# Patient Record
Sex: Female | Born: 1950 | Race: White | Hispanic: No | Smoking: Former smoker
Health system: Southern US, Community
[De-identification: ages and names within clinical notes are randomized; demographics above are authoritative.]

## PROBLEM LIST (undated history)

## (undated) DIAGNOSIS — M5020 Other cervical disc displacement, unspecified cervical region: Secondary | ICD-10-CM

## (undated) DIAGNOSIS — M1990 Unspecified osteoarthritis, unspecified site: Secondary | ICD-10-CM

## (undated) DIAGNOSIS — E039 Hypothyroidism, unspecified: Secondary | ICD-10-CM

## (undated) DIAGNOSIS — E785 Hyperlipidemia, unspecified: Secondary | ICD-10-CM

## (undated) DIAGNOSIS — M6530 Trigger finger, unspecified finger: Secondary | ICD-10-CM

## (undated) DIAGNOSIS — K219 Gastro-esophageal reflux disease without esophagitis: Secondary | ICD-10-CM

## (undated) DIAGNOSIS — R531 Weakness: Secondary | ICD-10-CM

## (undated) DIAGNOSIS — T7840XA Allergy, unspecified, initial encounter: Secondary | ICD-10-CM

## (undated) DIAGNOSIS — R351 Nocturia: Secondary | ICD-10-CM

## (undated) DIAGNOSIS — R112 Nausea with vomiting, unspecified: Secondary | ICD-10-CM

## (undated) DIAGNOSIS — R51 Headache: Secondary | ICD-10-CM

## (undated) DIAGNOSIS — R569 Unspecified convulsions: Secondary | ICD-10-CM

## (undated) DIAGNOSIS — K589 Irritable bowel syndrome without diarrhea: Secondary | ICD-10-CM

## (undated) DIAGNOSIS — Z87442 Personal history of urinary calculi: Secondary | ICD-10-CM

## (undated) DIAGNOSIS — M2550 Pain in unspecified joint: Secondary | ICD-10-CM

## (undated) DIAGNOSIS — N200 Calculus of kidney: Secondary | ICD-10-CM

## (undated) DIAGNOSIS — I10 Essential (primary) hypertension: Secondary | ICD-10-CM

## (undated) DIAGNOSIS — J45909 Unspecified asthma, uncomplicated: Secondary | ICD-10-CM

## (undated) DIAGNOSIS — G459 Transient cerebral ischemic attack, unspecified: Secondary | ICD-10-CM

## (undated) DIAGNOSIS — Z9889 Other specified postprocedural states: Secondary | ICD-10-CM

## (undated) DIAGNOSIS — R519 Headache, unspecified: Secondary | ICD-10-CM

## (undated) HISTORY — DX: Allergy, unspecified, initial encounter: T78.40XA

## (undated) HISTORY — PX: OTHER SURGICAL HISTORY: SHX169

## (undated) HISTORY — DX: Headache, unspecified: R51.9

## (undated) HISTORY — PX: TONSILLECTOMY: SUR1361

## (undated) HISTORY — PX: APPENDECTOMY: SHX54

## (undated) HISTORY — DX: Other cervical disc displacement, unspecified cervical region: M50.20

## (undated) HISTORY — PX: COLONOSCOPY: SHX174

## (undated) HISTORY — DX: Headache: R51

## (undated) HISTORY — PX: ESOPHAGOGASTRODUODENOSCOPY: SHX1529

## (undated) HISTORY — DX: Weakness: R53.1

## (undated) HISTORY — PX: KNEE ARTHROSCOPY: SUR90

---

## 1978-09-30 HISTORY — PX: ABDOMINAL HYSTERECTOMY: SHX81

## 1998-03-15 ENCOUNTER — Ambulatory Visit (HOSPITAL_COMMUNITY): Admission: RE | Admit: 1998-03-15 | Discharge: 1998-03-15 | Payer: Self-pay | Admitting: Internal Medicine

## 1998-03-17 ENCOUNTER — Ambulatory Visit (HOSPITAL_COMMUNITY): Admission: RE | Admit: 1998-03-17 | Discharge: 1998-03-17 | Payer: Self-pay | Admitting: Internal Medicine

## 1999-11-20 ENCOUNTER — Other Ambulatory Visit: Admission: RE | Admit: 1999-11-20 | Discharge: 1999-11-20 | Payer: Self-pay | Admitting: Obstetrics and Gynecology

## 1999-11-21 ENCOUNTER — Encounter: Payer: Self-pay | Admitting: Obstetrics and Gynecology

## 1999-11-21 ENCOUNTER — Ambulatory Visit (HOSPITAL_COMMUNITY): Admission: RE | Admit: 1999-11-21 | Discharge: 1999-11-21 | Payer: Self-pay | Admitting: Obstetrics and Gynecology

## 2000-11-24 ENCOUNTER — Encounter: Payer: Self-pay | Admitting: Neurosurgery

## 2000-11-24 ENCOUNTER — Inpatient Hospital Stay (HOSPITAL_COMMUNITY): Admission: RE | Admit: 2000-11-24 | Discharge: 2000-11-25 | Payer: Self-pay | Admitting: Neurosurgery

## 2000-12-17 ENCOUNTER — Encounter: Payer: Self-pay | Admitting: Neurosurgery

## 2000-12-17 ENCOUNTER — Encounter: Admission: RE | Admit: 2000-12-17 | Discharge: 2000-12-17 | Payer: Self-pay | Admitting: Neurosurgery

## 2001-01-01 ENCOUNTER — Other Ambulatory Visit: Admission: RE | Admit: 2001-01-01 | Discharge: 2001-01-01 | Payer: Self-pay | Admitting: Obstetrics and Gynecology

## 2001-01-30 ENCOUNTER — Encounter: Admission: RE | Admit: 2001-01-30 | Discharge: 2001-01-30 | Payer: Self-pay | Admitting: Neurosurgery

## 2001-01-30 ENCOUNTER — Encounter: Payer: Self-pay | Admitting: Neurosurgery

## 2002-03-24 ENCOUNTER — Other Ambulatory Visit: Admission: RE | Admit: 2002-03-24 | Discharge: 2002-03-24 | Payer: Self-pay | Admitting: Obstetrics and Gynecology

## 2002-07-31 ENCOUNTER — Encounter: Payer: Self-pay | Admitting: Family Medicine

## 2002-07-31 ENCOUNTER — Observation Stay (HOSPITAL_COMMUNITY): Admission: AD | Admit: 2002-07-31 | Discharge: 2002-08-01 | Payer: Self-pay | Admitting: Family Medicine

## 2004-02-17 ENCOUNTER — Emergency Department (HOSPITAL_COMMUNITY): Admission: EM | Admit: 2004-02-17 | Discharge: 2004-02-17 | Payer: Self-pay | Admitting: Emergency Medicine

## 2004-04-16 ENCOUNTER — Ambulatory Visit (HOSPITAL_COMMUNITY): Admission: RE | Admit: 2004-04-16 | Discharge: 2004-04-16 | Payer: Self-pay | Admitting: Internal Medicine

## 2005-09-30 HISTORY — PX: BACK SURGERY: SHX140

## 2006-02-17 ENCOUNTER — Ambulatory Visit (HOSPITAL_COMMUNITY): Admission: RE | Admit: 2006-02-17 | Discharge: 2006-02-18 | Payer: Self-pay | Admitting: Neurosurgery

## 2007-06-20 ENCOUNTER — Emergency Department (HOSPITAL_COMMUNITY): Admission: EM | Admit: 2007-06-20 | Discharge: 2007-06-20 | Payer: Self-pay | Admitting: Emergency Medicine

## 2007-11-12 ENCOUNTER — Emergency Department (HOSPITAL_COMMUNITY): Admission: EM | Admit: 2007-11-12 | Discharge: 2007-11-13 | Payer: Self-pay | Admitting: Emergency Medicine

## 2007-11-12 ENCOUNTER — Ambulatory Visit (HOSPITAL_COMMUNITY): Admission: RE | Admit: 2007-11-12 | Discharge: 2007-11-12 | Payer: Self-pay | Admitting: Urology

## 2007-11-12 HISTORY — PX: LITHOTRIPSY: SUR834

## 2008-08-31 ENCOUNTER — Ambulatory Visit (HOSPITAL_COMMUNITY): Admission: RE | Admit: 2008-08-31 | Discharge: 2008-09-01 | Payer: Self-pay | Admitting: Neurosurgery

## 2008-11-18 HISTORY — PX: EYE SURGERY: SHX253

## 2008-12-14 ENCOUNTER — Ambulatory Visit (HOSPITAL_COMMUNITY): Admission: RE | Admit: 2008-12-14 | Discharge: 2008-12-14 | Payer: Self-pay | Admitting: Internal Medicine

## 2008-12-21 ENCOUNTER — Encounter (HOSPITAL_COMMUNITY): Admission: RE | Admit: 2008-12-21 | Discharge: 2009-01-20 | Payer: Self-pay | Admitting: Internal Medicine

## 2009-05-31 ENCOUNTER — Ambulatory Visit (HOSPITAL_COMMUNITY): Admission: RE | Admit: 2009-05-31 | Discharge: 2009-05-31 | Payer: Self-pay | Admitting: Urology

## 2009-06-01 HISTORY — PX: EYE SURGERY: SHX253

## 2009-07-15 ENCOUNTER — Emergency Department (HOSPITAL_COMMUNITY): Admission: EM | Admit: 2009-07-15 | Discharge: 2009-07-15 | Payer: Self-pay | Admitting: Emergency Medicine

## 2009-09-08 IMAGING — US US ABDOMEN COMPLETE
1 series · 13 of 25 positions shown · non-contrast
Comparison: None

CLINICAL DATA: Biliary colic, history right renal calculus,
dysphagia

COMPLETE ABDOMINAL ULTRASOUND

[Series 1: us abdomen complete · 0.28mm/px · 13 of 76 slices shown]
[im 1/76]
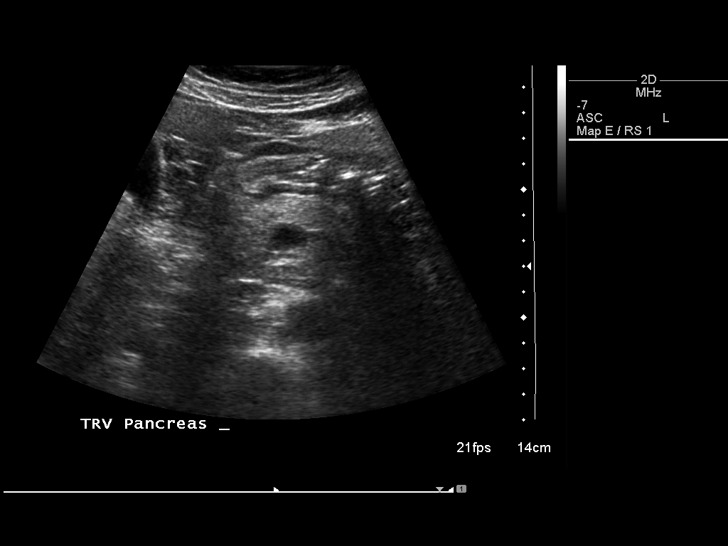
[im 7/76]
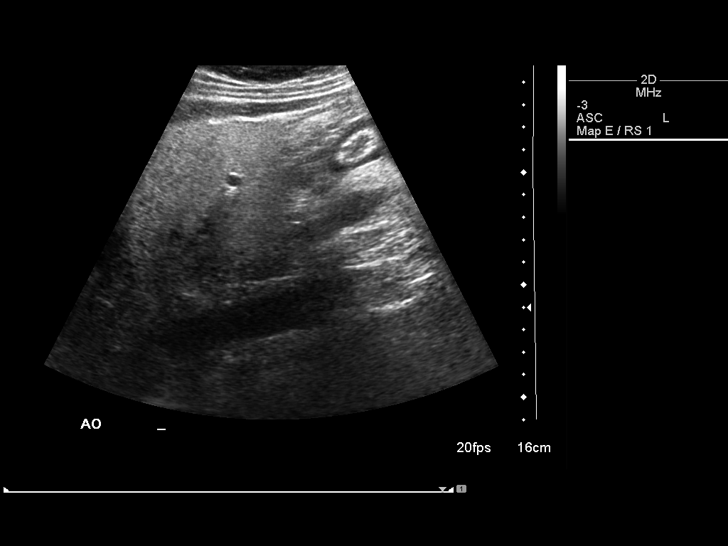
[im 13/76]
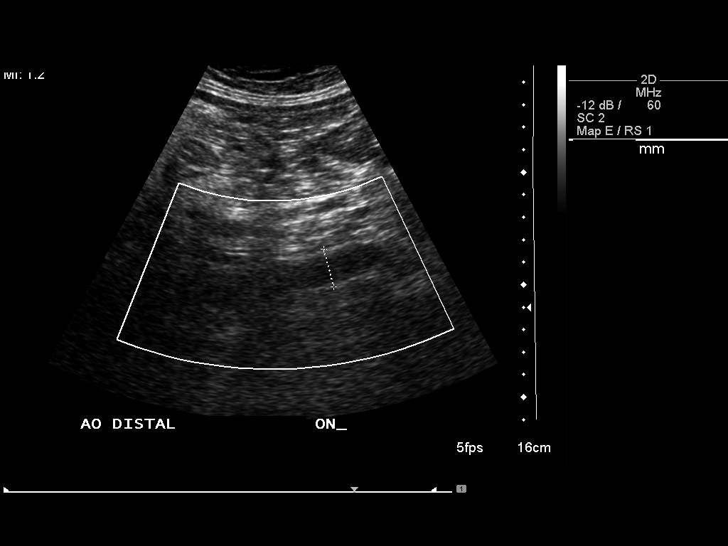
[im 19/76]
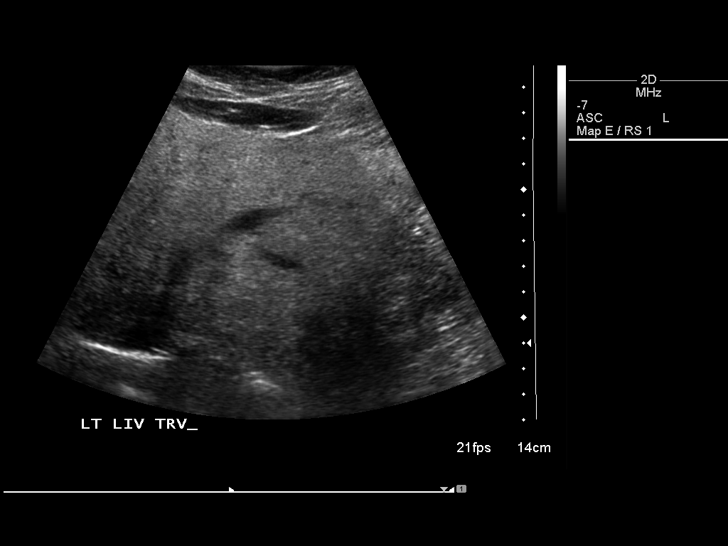
[im 26/76]
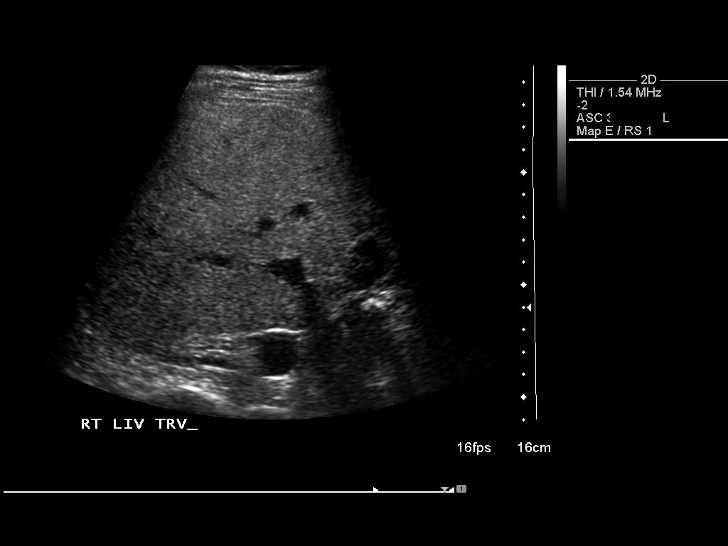
[im 32/76]
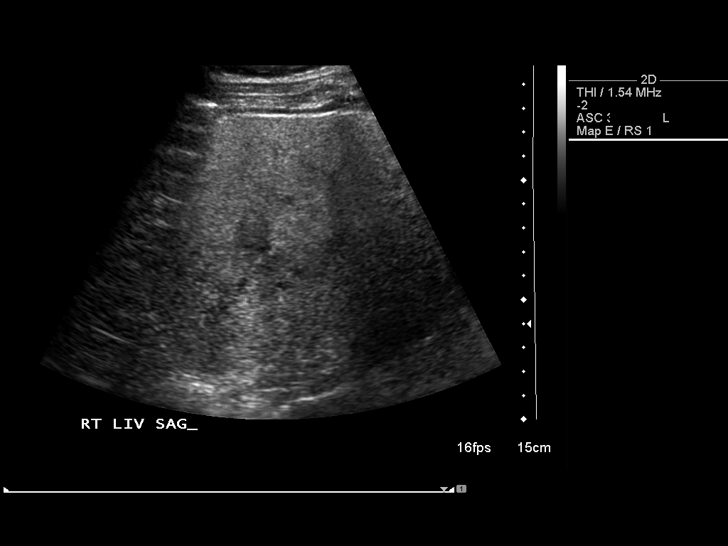
[im 38/76]
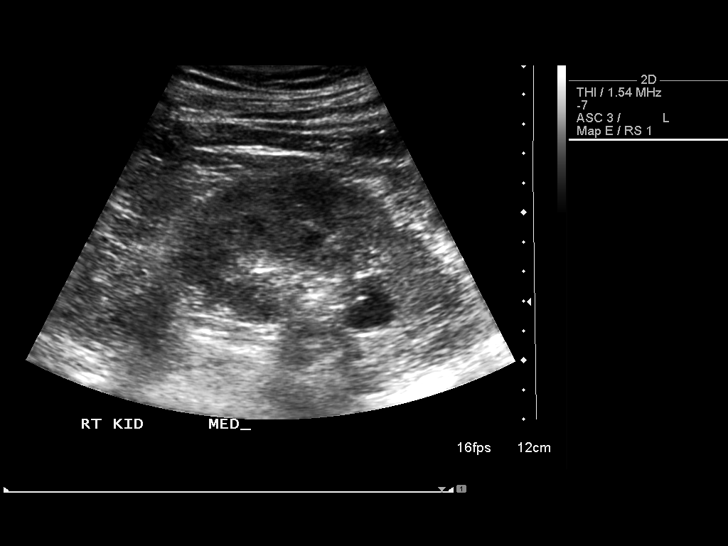
[im 44/76]
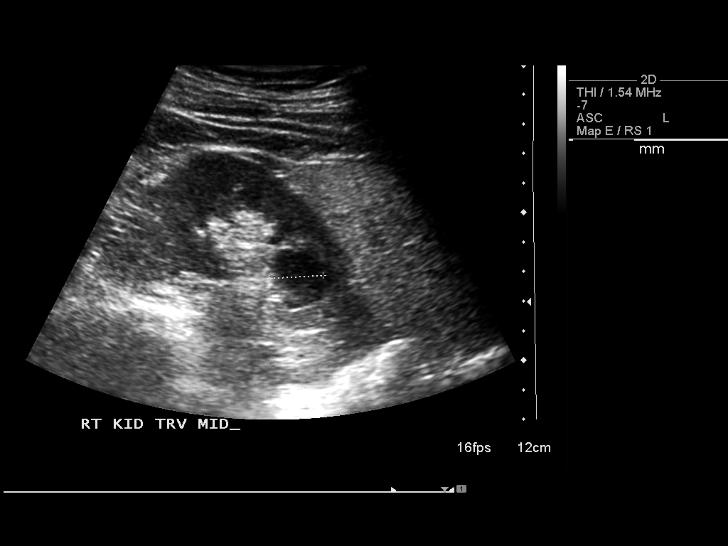
[im 51/76]
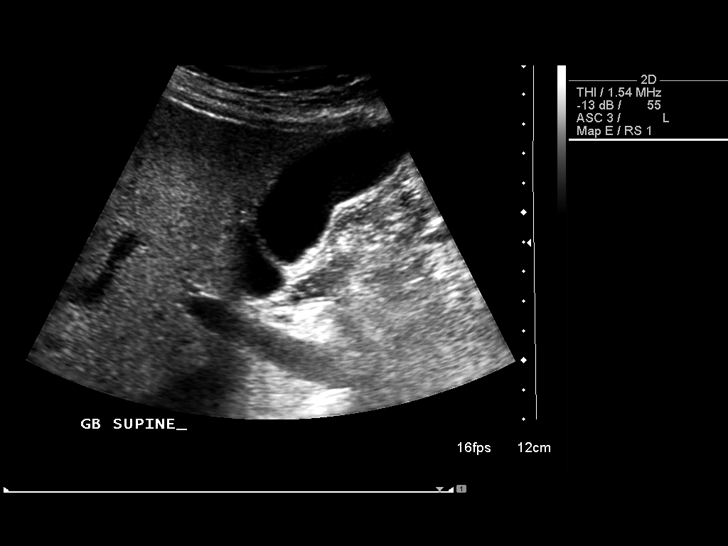
[im 57/76]
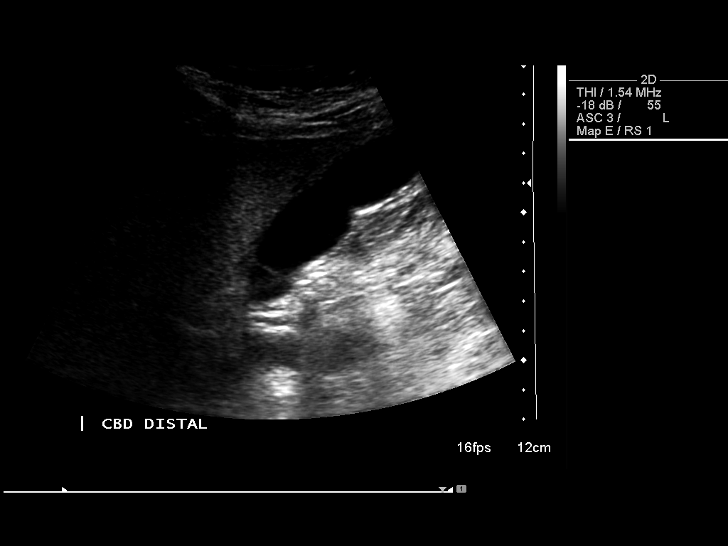
[im 63/76]
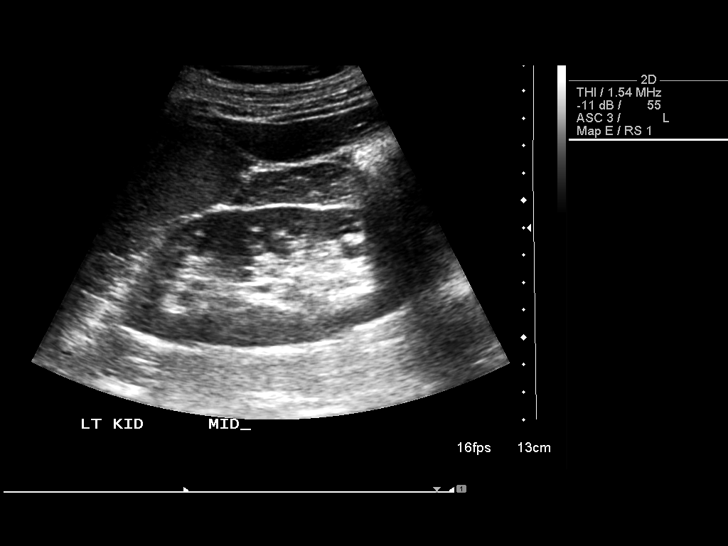
[im 69/76]
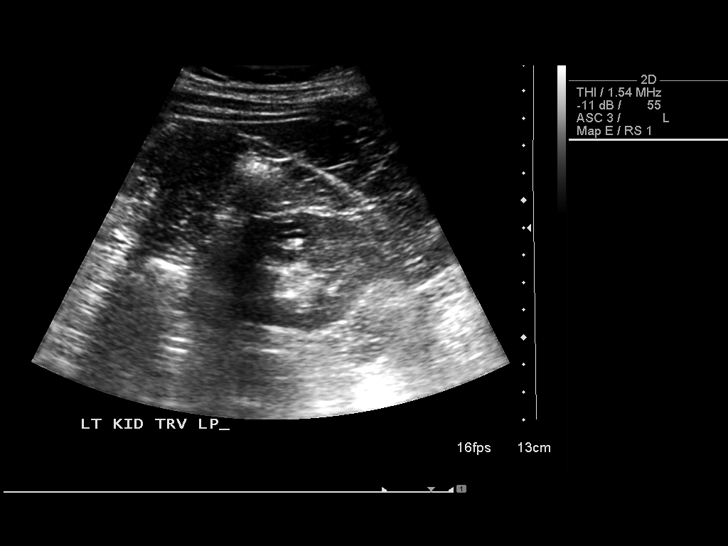
[im 76/76]
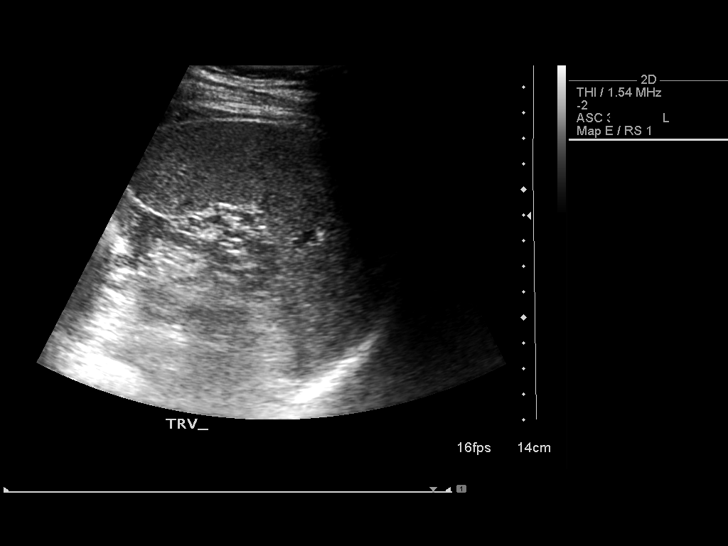

[13 of 25 positions shown; findings below may reference images not displayed]

FINDINGS: Gallbladder:  Normally distended without stones, wall thickening,
pericholecystic fluid, or sonographic Murphy's sign.

Common bile duct:  2.9 mm diameter, normal.

Liver:  Echogenic, likely fatty infiltration, though this can be
seen with cirrhosis and certain infiltrative disorders.  No focal
hepatic mass or nodularity.  Minimal inhomogeneity of hepatic
echogenicity centrally and near gallbladder fossa, potentially may
represent areas of focal sparing.

IVC:  Unremarkable.

Pancreas:  Normal appearance.

Spleen:  Normal appearance, 9.6 cm length.

Right Kidney:  Normal size 10.8 cm length with peripelvic cyst at
lower pole 1.6 x 1.4 x 1.8 cm.

Left Kidney:  Normal size 10.1 cm length without hydronephrosis or
shadowing calculus.  Echogenic nonshadowing focus noted in the mid
left kidney, nonspecific, nonshadowing calculus not excluded.

Abdominal aorta:  Normal caliber.

No ascites.
IMPRESSION: Probable fatty infiltration of liver with question scattered focal
sparing.
Cannot exclude nonshadowing nonobstructing mid left renal calculus.
Otherwise normal exam.

## 2010-03-14 ENCOUNTER — Encounter: Admission: RE | Admit: 2010-03-14 | Discharge: 2010-03-14 | Payer: Self-pay | Admitting: Neurosurgery

## 2010-03-19 ENCOUNTER — Encounter (HOSPITAL_COMMUNITY): Admission: RE | Admit: 2010-03-19 | Discharge: 2010-03-19 | Payer: Self-pay | Admitting: Endocrinology

## 2010-03-30 ENCOUNTER — Ambulatory Visit (HOSPITAL_COMMUNITY): Admission: RE | Admit: 2010-03-30 | Discharge: 2010-03-30 | Payer: Self-pay | Admitting: Endocrinology

## 2010-05-29 ENCOUNTER — Ambulatory Visit (HOSPITAL_COMMUNITY): Admission: RE | Admit: 2010-05-29 | Discharge: 2010-05-29 | Payer: Self-pay | Admitting: Internal Medicine

## 2010-10-21 ENCOUNTER — Encounter: Payer: Self-pay | Admitting: Internal Medicine

## 2010-11-23 ENCOUNTER — Other Ambulatory Visit: Payer: Self-pay | Admitting: Urology

## 2010-11-23 ENCOUNTER — Ambulatory Visit (HOSPITAL_COMMUNITY)
Admission: RE | Admit: 2010-11-23 | Discharge: 2010-11-23 | Disposition: A | Discharge status: Home / Self Care | Payer: BC Managed Care – PPO | Source: Ambulatory Visit | Attending: Urology | Admitting: Urology

## 2010-11-23 DIAGNOSIS — N200 Calculus of kidney: Secondary | ICD-10-CM

## 2010-11-23 DIAGNOSIS — R109 Unspecified abdominal pain: Secondary | ICD-10-CM | POA: Insufficient documentation

## 2010-11-30 ENCOUNTER — Ambulatory Visit (INDEPENDENT_AMBULATORY_CARE_PROVIDER_SITE_OTHER): Payer: BC Managed Care – PPO | Admitting: Urology

## 2010-11-30 DIAGNOSIS — N318 Other neuromuscular dysfunction of bladder: Secondary | ICD-10-CM

## 2010-11-30 DIAGNOSIS — R1033 Periumbilical pain: Secondary | ICD-10-CM

## 2010-11-30 DIAGNOSIS — M545 Low back pain: Secondary | ICD-10-CM

## 2010-11-30 DIAGNOSIS — Z87442 Personal history of urinary calculi: Secondary | ICD-10-CM

## 2011-02-12 NOTE — Op Note (Signed)
[name], [name]                ACCOUNT NO.:  [id number]   MEDICAL RECORD NO.:  [id number]          PATIENT TYPE:  OIB   LOCATION:  3536                         FACILITY:  [facility]   PHYSICIAN:  Hewitt Shorts, M.D.DATE OF BIRTH:  05-Oct-1950   DATE OF PROCEDURE:  DATE OF DISCHARGE:                               OPERATIVE REPORT   PREOPERATIVE DIAGNOSES:  1. C6-7 cervical disk herniation.  2. Cervical degenerative disk disease.  3. Cervical spondylosis.  4. Cervical radiculopathy.   POSTOPERATIVE DIAGNOSES:  1. C6-7 cervical disk herniation.  2. Cervical degenerative disk disease.  3. Cervical spondylosis.  4. Cervical radiculopathy.   PROCEDURE:  C6-7 anterior cervical decompression arthrodesis with  allograft and cervical plating with an RSB spine interplate.   SURGEON:  Hewitt Shorts, MD   ASSISTANT:  1. Nelia Shi. Webb Silversmith, NP.  2. Coletta Memos, MD   ANESTHESIA:  General endotracheal.   INDICATIONS:  The patient is a 60 year old woman who has had a previous  C5-6 ACDF.  She presented with neck and radicular pain and was found to  have a large disk herniation at the C6-7 level extending bilaterally  with degeneration at that level as well as other levels in the cervical  spine.  Decision was made to proceed with single-level decompression  arthrodesis.   PROCEDURE:  The patient was brought to the operating room and placed  under general endotracheal anesthesia.  The patient was placed in 10  pounds of Holter traction.  The neck was prepped with Betadine solution  and draped in sterile fashion.  A horizontal incision was made in the  left side of the neck about a fingerbreadth below her previous left-  sided anterior cervical incision.  Dissection was carried down through  the subcutaneous tissue and platysma with bipolar cautery.  Electrocautery was used to maintain hemostasis.  Dissection was carried  out through the avascular plane leaving the  sternocleidomastoid, carotid  artery, and jugular vein laterally and the trachea and esophagus  medially.  The ventral aspect of the vertebral column was identified and  localizing x-rays were taken and the C6-7 intervertebral disk space was  identified.  Diskectomy was begun with incision of the annulus and  continued with microcurettes and pituitary rongeurs.  The operating  microscope was draped and brought into the field to provide additional  magnification, illumination, and visualization.  The remainder of the  decompression was performed using microdissection and microsurgical  technique.  Diskectomy was continued posteriorly through the disk space.  The cauterized endplates of the vertebral bodies were removed using  microcurettes along with the XMax drill, and then posterior osteophytic  overgrowth was removed using the XMax drill.  We then began to encounter  the disk herniation, a portion of it which was embedded within the  posterior longitudinal ligament.  But then as we opened the posterior  longitudinal ligament, there were significant additional portions  posterior to the ligament.  There was disk herniation extending  laterally to the neuroforamen bilaterally and this was carefully  mobilized using a micro hook.  In the end,  all loose fragments of disk  material removed from the disk space and the epidural space.  The  posterior longitudinal ligament was fully removed and the spinal canal  and thecal sac as well as the axillary nerve roots and neuroforamen were  decompressed.  Once the diskectomy was completed.  Hemostasis was  established with the use of Gelfoam soaked in thrombin, the Gelfoam was  removed.  We measured the height of the vertebral disk space and  selected a 7-mm in height allograft.  We used Osteotec dense cancellous  7-mm x 11-mm graft.  It was positioned in the intervertebral disk spaces  and countersunk.  We then selected a 7-mm RSB spine interplate.   It  actually was taken from proper sizing and then a pair of 10 mm screws  were placed rostrally into the C6 vertebra and a single 10-mm screw was  placed caudally into the C7 vertebra.  Then, the cover plate was  tightened with a torque wrench.  Another x-ray was taken, which showed  the plate and screws in good position.  The alignment was good.   The wound was irrigated with Bacitracin solution and checked for  hemostasis, which was established with use of bipolar cautery and once  hemostasis was confirmed, we proceeded with closure.  The platysma was  closed with interrupted and inverted 2-0 undyed Vicryl sutures.  The  subcutaneous and subcuticular layer were closed with interrupted 3-0  undyed Vicryl sutures.  The skin was reapproximated with Dermabond.  The  procedure was tolerated well.  The estimated blood loss was 50 mL.  Sponge and needle count were correct.  The cervical traction was  discontinued after the bone graft was placed prior to the plating.      Hewitt Shorts, M.D.  Electronically Signed     RWN/MEDQ  D:  08/31/2008  T:  09/01/2008  Job:  [id number]

## 2011-02-15 NOTE — H&P (Signed)
[place]. [facility]  Patient:    [name], [name]                        MRN: [id number] Adm. Date:  11/24/00 Attending:  Hewitt Shorts, M.D.                         History and Physical  HISTORY OF PRESENT ILLNESS:  The patient is a 60 year old, right-handed white female who was evaluated for a left cervical radiculopathy, secondary to a central to left C5-6 cervical disk herniation.  The patient explained that she has been having discomfort in the left side of her neck for some time, but around the holidays she developed pain around the left scapula, as well as headaches, and disabling numbness in the left upper extremity into her arm and forearm, particularly to the first and second digits of her left hand.  She had x-rays and an MRI scan, and a neurosurgical consultation was requested. She did not describe any specific weakness, but she says that she has been dropping things from her left hand recent.  She denies any right upper extremity symptoms.  She was on Vioxx which has reduced the pain, but it has not done much for her numbness.  She was given a prescription for Darvocet but has not used much of it, since the Vioxx helps to reduce the pain.  The patient is admitted now for a single-level C5-6 anterior cervical diskectomy and arthrodesis, with allograft and cervical plating.  PAST MEDICAL HISTORY:  No history of hypertension, myocardial infarction, cancer, stroke, diabetes mellitus, peptic ulcer disease, or lung disease.  PAST SURGICAL HISTORY: 1. Hysterectomy. 2. Surgery on her left leg. 3. Bilateral carpal tunnel release in 1995.  ALLERGIES:  TETRACYCLINE.  CURRENT MEDICATIONS: 1. Estrace one q.a.m. 2. A decongestant once or twice q.d. 3. Vioxx 25 mg q.d.  FAMILY HISTORY:  Her mother is in poor health at age 5.  She has heart disease,  hypertension, and dementia.  Her father committed suicide in his late 69s.  He was an  alcoholic.  SOCIAL HISTORY:  The patient is married.  She works as a Retail buyer. She smokes 3/4 pack per day.  She has smoked for 33 years.  She does not drink alcoholic beverages.  She denies a history of substance abuse.  REVIEW OF SYSTEMS:  Notable for that described in her history of present illness and past medical history, but is otherwise unremarkable.  PHYSICAL EXAMINATION:  GENERAL:  A well-developed, well-nourished white female, in no acute distress.  VITAL SIGNS:  Temperature 97.5 degrees, pulse 86, blood pressure 132/65, respirations 16.  Height 5 feet 6 inches, weight 145 pounds.  LUNGS:  Clear to auscultation.  She has symmetrical respiratory excursions.  HEART:  A regular rate and rhythm.  Normal S1, S2.  There are no murmurs.  ABDOMEN:  Soft, nondistended.  Bowel sounds present.  EXTREMITIES:  No cyanosis, clubbing, or edema.  MUSCULOSKELETAL:  Shows no tenderness to palpation over the cervical spinous process or paracervical musculature.  She has a good range of motion of the neck, but some discomfort, particularly with extension of the neck, as well as with lateral flexion to the left.  She has mild discomfort on testing for impingement of the left shoulder, but no tenderness to palpation over the acromioclavicular joints bilaterally.  There is no discomfort on testing for impingement in the right  shoulder.  NEUROLOGIC:  Shows 5/5 strength in the upper extremities, including the deltoid, biceps, triceps, intrinsics, and grip.  Sensation intact to pinprick through the upper extremities.  Reflexes are minimal in the biceps, brachialis, and triceps, 1 in the quadriceps, minimal in the gastrocnemius, symmetrical bilaterally.  Toes are downgoing bilaterally.  She has a normal gait and stance.  DIAGNOSTIC STUDIES:  X-rays show little in the way of degenerative disk disease or spondylosis.  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  The most significant finding on the MRI is a small central to left C5-6 cervical disk herniation, exiting to her foramen.  IMPRESSION:  Left C6 cervical radiculopathy, secondary to a small central to left C5-6 cervical disk herniation.  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  She does have mild discomfort on testing for impingement in the left shoulder, suggestive of a mild rotator tendinitis, that is improved with Vioxx.  PLAN:  The patient is admitted for a single-level C5-6 anterior cervical diskectomy and arthrodesis with allograft and cervical plating.  We discussed alternatives to the surgery, and the possible future need of a decompressive cervical laminectomy.  We discussed the nature of the C5-6 anterior cervical diskectomy and arthrodesis, to be done with allograft and cervical plating, the typical length of surgery, hospital stay, and overall recuperation, the limitations during the postoperative period, and the need for postoperative immobilization in a soft cervical collar.  We also discussed the risks of surgery, including the risks of infection, bleeding, possible need for transfusion, the risks of nerve root dysfunction, with pain, weakness, numbness, or paresthesias, the risks of esophageal and/or laryngeal dysfunction with difficulty swallowing or hoarseness of voice.  We discussed the risks of failure of the arthrodesis and nonunion, particularly due to her smoking, but also to her being postmenopausal.  We discussed the risks of anesthesia, including a myocardial infarction, stroke, pneumonia, and death, all of which are increased due to her smoking history.  She has further been strongly counseled to quit smoking, to improve the likelihood  of a successful arthrodesis and outcome.  Understanding all of this, the patient does wish to proceed with surgery, and is admitted for such. DD:  11/24/00 TD:  11/24/00  Job: [id number]

## 2011-02-15 NOTE — Op Note (Signed)
[place]  Patient:    [name], [name]                       MRN: [id number] Proc. Date: 11/24/00 Adm. Date:  [id number] Attending:  Barton Fanny                           Operative Report  PREOPERATIVE DIAGNOSIS:  C5-6 cervical disk herniation.  POSTOPERATIVE DIAGNOSIS:  C5-6 cervical disk herniation.  PROCEDURE:  C5-6 Anterior cervical diskectomy, arthrodesis with iliac crest allograft and tether cervical plating.  SURGEON:  Hewitt Shorts, M.D.  ANESTHESIA:  General endotracheal.  INDICATIONS:   Patient is a 60 year old woman, who presented with a left cervical radiculopathy, who was found by MRI scan to have a central to left C5-6 cervical disk herniation.  Decision was made to proceed with a single level anterior cervical diskectomy and arthrodesis.  DESCRIPTION OF PROCEDURE:  Patient brought to the operating room and placed under general endotracheal anesthesia.  Patient was placed in 10 pounds of Holter traction and the neck was prepped with Betadine soap and solution and draped in a sterile fashion.  A horizontal incision was made in the left side of the neck.  The line of the incision was infiltrated with local anesthetic with epinephrine.  The incision itself was made with a Shaw scalpel at temperature 120.  The dissection was carried down through the subcutaneous tissue and platysma.  Dissection was then carried out to an avascular plane leaving the sternocleidomastoid, carotid artery and jugular vein laterally and trachea and esophagus medially.  The ventral aspect of the vertebral column was identified, localizing x-ray taken and the C5-6 intravertebral disk space was identified.  Diskectomy was begun with incision of the annulus and continued with microcurets and pituitary rongeurs.  The microscope was draped and brought into the field to provide instrument magnification, illumination and visualization and the  remainder of the procedure was performed using microdissection technique.   The cartilaginous endplates of the C5 and C6 vertebrae were removed using the MicroMax drill and then we removed posterior osteophytic overgrowth with the MicroMax drill and 2 mm Kerrison punch with a thin footplate.  Foraminotomies were performed bilaterally and the posterior longitudinal ligament was removed.  We then removed the disk herniation that was located centrally and to the left and good decompression of the thecal sac and of the C6 nerve roots was achieved bilaterally.  Once the diskectomy and decompression was completed, we established hemostasis with the use of Gelfoam soaked in thrombin.  All the Gelfoam was removed prior to proceeding with the arthrodesis.  We selected a wedge of iliac crest allograft.  It was cut and shaped and positioned in the intervertebral disk space and countersunk. Anterior osteophytic overgrowth was removed and then we selected a 14 mm tether cervical plate.  It was positioned over the fusion construct and secured to each of the vertebra with a pair of 4.0 x 13 mm screws.  Each screw hole was started with an awl and then screw was placed in an alternating fashion.  Final tightening of all four screws was done and x-ray was taken that showed the graft in good position, plate and screws in good position. The overall alignment was good.  We then irrigated the wound with bacitracin solution, checked for hemostasis, which was established and confirmed and then proceeded with closure.  The platysma  was closed with interrupted inverted 2-0 undyed Vicryl sutures.  The subcutaneous and subcuticular were closed with interrupted, inverted 3-0 undyed Vicryl sutures and the skin edges were approximated with Dermabond.  The patient tolerated the procedure well.  The estimated blood loss was less than 50 cc.  Sponge and needle counts were correct.  Following surgery, the patient was taken  out of cervical traction to be reversed from the anesthetic, extubated and transferred to the recovery room for further care. DD:  11/24/00 TD:  11/24/00 Job: [id number]

## 2011-02-15 NOTE — Op Note (Signed)
[name], [name]                ACCOUNT NO.:  [id number]   MEDICAL RECORD NO.:  [id number]          PATIENT TYPE:  OIB   LOCATION:  3014                         FACILITY:  [facility]   PHYSICIAN:  Hewitt Shorts, M.D.DATE OF BIRTH:  1950/10/15   DATE OF PROCEDURE:  02/17/2006  DATE OF DISCHARGE:                                 OPERATIVE REPORT   PREOPERATIVE DIAGNOSES:  1.  Left L5-S1 lumbar disk herniation.  2.  Lumbar degenerative disk disease.  3.  Lumbar spondylosis.  4.  Lumbar radiculopathy.   POSTOPERATIVE DIAGNOSES:  1.  Left L5-S1 lumbar disk herniation.  2.  Lumbar degenerative disk disease.  3.  Lumbar spondylosis.  4.  Lumbar radiculopathy.   PROCEDURE:  Left L5-S1 lumbar laminotomy and microdiskectomy with  microdissection.   SURGEON:  Hewitt Shorts, MD.   ASSISTANT:  Clydene Fake, MD.   ANESTHESIA:  General endotracheal.   INDICATIONS:  The patient is a 60 year old woman, who presented with a left  lumbar radiculopathy and was found by MRI scan to have a large, central,  left, L5-S1 lumbar disk herniation.  The decision was made to proceed with  laminotomy and microdiskectomy.   DESCRIPTION OF PROCEDURE:  The patient was brought to the operating room and  placed under a general endotracheal anesthesia.  The patient was turned to a  prone position.  The lumbar region was prepped with Betadine soap and  solution and draped in a sterile fashion.  An x-ray was taken and the L5-S1  level identified, and a midline incision was made over the L5-S1 level and  carried down through the subcutaneous tissue.  Bipolar cautery and  electrocautery were used to maintain hemostasis.  Dissection was carried  down to the lumbar fascia, which was incised on the left side of midline,  and the paraspinal muscles were dissected from the spinous process and  lamina in a subperiosteal fashion.  The L5-S1 interlaminar space was  identified, and an x-ray was taken to  confirm the localization.  Then the  microscope was draped and brought onto the field to provide additional  magnification, illumination, and visualization, and the remainder of the  decompression was performed using microdissection and microsurgical  technique.  The laminotomy was performed using the XMax drill and Kerrison  punches.  The ligamentum flavum was carefully removed, and we identified the  thecal sac exiting the left S1 nerve root.  These structures were gently  retracted medially exposing a large, spondylitic, disk herniation.  We  incised the spondylitic annulus and removed hypertrophic changes from the  posterior aspect of the L5 and S1 vertebrae.  The diskectomy was performed  using a variety of microcurettes and pituitary rongeurs.  A thorough  diskectomy was performed removing all loose fragmented disk material and  decompressing the thecal sac and S1 nerve root.  A thorough diskectomy was  performed with good decompression of the neural structures, and once the  diskectomy was completed, hemostasis was established with the use of bipolar  cautery as well as electrocautery as well as Gelfoam soaked in thrombin,  and  once the hemostasis was confirmed we instilled 2 ml of Fentanyl and 80 mg of  Depo-Medrol into the epidural space and then proceeded with closure.  The  lumbar fascia was closed with interrupted undyed #1-0 Vicryl sutures, the  Scarpa's fascia was closed with interrupted #1-0 and #2-0 undyed Vicryl  sutures, and the subcutaneous and subcuticular layer closed with interrupted  inverted #2-0 and #3-0 undyed Vicryl sutures, and the skin was approximated  with Dermabond.  The procedure was tolerated well.  The estimated blood loss  was less than 25 ml.  Sponge, needle, and instrument counts were correct.  Following this, the patient was turned back to the supine position to be  reversed from anesthetic, extubated, and transferred to the recovery room  for further  care.      Hewitt Shorts, M.D.  Electronically Signed     RWN/MEDQ  D:  02/17/2006  T:  02/17/2006  Job:  [id number]

## 2011-02-15 NOTE — H&P (Signed)
NAMEAvrie, [name] NO.:  [id number]   MEDICAL RECORD NO.:  [id number]                  PATIENT TYPE:   LOCATION:                                       FACILITY:   PHYSICIAN:  Corrie Mckusick, M.D.               DATE OF BIRTH:  Jun 21, 1951   DATE OF ADMISSION:  07/31/2002  DATE OF DISCHARGE:                                HISTORY & PHYSICAL   ADMISSION DIAGNOSIS:  Asthmatic bronchitis.   PRIMARY PHYSICIAN:  [facility].   HISTORY OF PRESENT ILLNESS:  A 60 year old asthmatic who also smokes who  came to the office on July 26, 2002 with four to five days of cough, mild  shortness of breath, and was put on Z pack, Anaplex, and given Decadron.  She was strongly encouraged to quit smoking.  Since that time she has  basically not gotten any better and has possibly gotten worse.  She came to  the office this morning with more wheezing, more diffuse rhonchi, and  overall feeling not much better.  It was decided to put her in overnight for  aggressive nebulized treatments and IV antibiotics.   PAST MEDICAL HISTORY:  Asthma.   PAST SURGICAL HISTORY:  Partial hysterectomy, left knee surgery,  tonsillectomy, and lumbar discectomy.   SOCIAL HISTORY:  She smokes a half a pack per day, no alcohol abuse.   FAMILY HISTORY:  Significant for hypertension, diabetes, and asthma.   OBJECTIVE:  VITAL SIGNS:  Respiratory rate 22, pulse 78, temperature 98.0.  GENERAL:  When I saw the patient she was talkative but clearly short of  breath.  HEENT:  Normocephalic, atraumatic.  Pupils equal, round, and reactive to  light, extraocular muscles intact.  Nasal and oropharynx are clear.  CHEST:  Diffuse end expiratory and inspiratory rhonchi with mild end  expiratory wheezing.  Prolonged expiratory phase as well.  No areas of  consolidation at the bases.  CARDIOVASCULAR:  Regular rate and rhythm with normal S1, S2.  ABDOMEN:  Soft.  EXTREMITIES:  No cyanosis or  clubbing, no edema.   ASSESSMENT:  A 60 year old female with asthmatic bronchitis.   PLAN:  1. Admit for observation.  2. Albuterol and Atrovent nebulizers q.i.d.  3. Albuterol nebulizers q.2h. p.r.n.  4. Levaquin 500 mg IV q.24h.  5. Prednisone 50 mg q.d.  6. Advair 250/50 b.i.d.  7. Singulair 10 mg daily.  8. Continue other hospital medications.  9. Strongly encourage the patient to discontinue smoking.  10.      CBC, Chem-7 on admission.  11.      Chest x-ray on admission.  12.      Will continue to follow closely.  Corrie Mckusick, M.D.    JCG/MEDQ  D:  07/31/2002  T:  07/31/2002  Job:  [id number]

## 2011-04-17 ENCOUNTER — Other Ambulatory Visit (HOSPITAL_COMMUNITY): Payer: Self-pay | Admitting: Endocrinology

## 2011-04-17 DIAGNOSIS — E049 Nontoxic goiter, unspecified: Secondary | ICD-10-CM

## 2011-04-22 ENCOUNTER — Other Ambulatory Visit (HOSPITAL_COMMUNITY): Payer: BC Managed Care – PPO

## 2011-04-23 ENCOUNTER — Ambulatory Visit (HOSPITAL_COMMUNITY)
Admission: RE | Admit: 2011-04-23 | Discharge: 2011-04-23 | Disposition: A | Discharge status: Home / Self Care | Payer: BC Managed Care – PPO | Source: Ambulatory Visit | Attending: Endocrinology | Admitting: Endocrinology

## 2011-04-23 DIAGNOSIS — E049 Nontoxic goiter, unspecified: Secondary | ICD-10-CM | POA: Insufficient documentation

## 2011-06-07 ENCOUNTER — Other Ambulatory Visit (INDEPENDENT_AMBULATORY_CARE_PROVIDER_SITE_OTHER): Payer: Self-pay | Admitting: *Deleted

## 2011-06-07 DIAGNOSIS — K219 Gastro-esophageal reflux disease without esophagitis: Secondary | ICD-10-CM

## 2011-06-07 NOTE — Telephone Encounter (Signed)
Refill request for Omeprazole 20 mg Capsule #90 with 11 refills . Last Refill was 03-11-2011. Prescriber: Delrae Rend  Patient takes 1 capsule by mouth once daily

## 2011-06-10 MED ORDER — OMEPRAZOLE 20 MG PO CPDR: 20.0000 mg | DELAYED_RELEASE_CAPSULE | Freq: Every day | ORAL | Status: DC

## 2011-06-14 ENCOUNTER — Other Ambulatory Visit (HOSPITAL_COMMUNITY): Payer: Self-pay | Admitting: Internal Medicine

## 2011-06-14 ENCOUNTER — Ambulatory Visit (HOSPITAL_COMMUNITY)
Admission: RE | Admit: 2011-06-14 | Discharge: 2011-06-14 | Disposition: A | Discharge status: Home / Self Care | Payer: BC Managed Care – PPO | Source: Ambulatory Visit | Attending: Internal Medicine | Admitting: Internal Medicine

## 2011-06-14 DIAGNOSIS — R079 Chest pain, unspecified: Secondary | ICD-10-CM

## 2011-06-14 DIAGNOSIS — F17200 Nicotine dependence, unspecified, uncomplicated: Secondary | ICD-10-CM | POA: Insufficient documentation

## 2011-06-14 DIAGNOSIS — R05 Cough: Secondary | ICD-10-CM | POA: Insufficient documentation

## 2011-06-14 DIAGNOSIS — R0789 Other chest pain: Secondary | ICD-10-CM | POA: Insufficient documentation

## 2011-06-14 DIAGNOSIS — R059 Cough, unspecified: Secondary | ICD-10-CM | POA: Insufficient documentation

## 2011-07-05 LAB — CBC
HCT: 47.3 % — ABNORMAL HIGH (ref 36.0–46.0)
Hemoglobin: 15.5 g/dL — ABNORMAL HIGH (ref 12.0–15.0)
MCHC: 32.8 g/dL (ref 30.0–36.0)
MCV: 86.6 fL (ref 78.0–100.0)
Platelets: 287 10*3/uL (ref 150–400)
RBC: 5.46 MIL/uL — ABNORMAL HIGH (ref 3.87–5.11)
RDW: 14.7 % (ref 11.5–15.5)
WBC: 6.9 10*3/uL (ref 4.0–10.5)

## 2012-05-11 ENCOUNTER — Other Ambulatory Visit (HOSPITAL_COMMUNITY): Payer: Self-pay | Admitting: "Endocrinology

## 2012-05-11 DIAGNOSIS — E213 Hyperparathyroidism, unspecified: Secondary | ICD-10-CM

## 2012-05-18 ENCOUNTER — Encounter (HOSPITAL_COMMUNITY): Payer: Self-pay

## 2012-05-18 ENCOUNTER — Encounter (HOSPITAL_COMMUNITY)
Admission: RE | Admit: 2012-05-18 | Discharge: 2012-05-18 | Disposition: A | Discharge status: Home / Self Care | Payer: BC Managed Care – PPO | Source: Ambulatory Visit | Attending: "Endocrinology | Admitting: "Endocrinology

## 2012-05-18 DIAGNOSIS — E213 Hyperparathyroidism, unspecified: Secondary | ICD-10-CM

## 2012-05-18 HISTORY — DX: Unspecified asthma, uncomplicated: J45.909

## 2012-05-18 MED ORDER — TECHNETIUM TC 99M SESTAMIBI - CARDIOLITE
25.0000 | Freq: Once | INTRAVENOUS | Status: AC | PRN
  Administered 2012-05-18: 23 via INTRAVENOUS

## 2012-06-10 ENCOUNTER — Ambulatory Visit (INDEPENDENT_AMBULATORY_CARE_PROVIDER_SITE_OTHER): Payer: BC Managed Care – PPO | Admitting: Surgery

## 2012-06-10 ENCOUNTER — Encounter (INDEPENDENT_AMBULATORY_CARE_PROVIDER_SITE_OTHER): Payer: Self-pay | Admitting: Surgery

## 2012-06-10 VITALS — BP 142/80 | HR 76 | Temp 97.8°F | Resp 16 | Ht 66.0 in | Wt 151.0 lb

## 2012-06-10 DIAGNOSIS — E210 Primary hyperparathyroidism: Secondary | ICD-10-CM

## 2012-06-10 NOTE — Patient Instructions (Signed)
[facility] [phone]  THYROID & PARATHYROID SURGERY -- POST OP INSTRUCTIONS  Always review your discharge instruction sheet from the facility where your surgery was performed.  1. A prescription for pain medication may be given to you upon discharge.  Take your pain medication as prescribed.  If narcotic pain medicine is not needed, then you may take acetaminophen (Tylenol) or ibuprofen (Advil) as needed. 2. Take your usually prescribed medications unless otherwise directed. 3. If you need a refill on your pain medication, please contact your pharmacy. They will contact our office to request authorization.  Prescriptions will not be processed after 5 pm or on weekends. 4. Start with a light diet upon arrival home, such as soup and crackers or toast.  Be sure to drink plenty of fluids daily.  Resume your normal diet the day after surgery. 5. Most patients will experience some swelling and bruising on the chest and neck area.  Ice packs will help.  Swelling and bruising can take several days to resolve.  6. It is common to experience some constipation if taking pain medication after surgery.  Increasing fluid intake and taking a stool softener will usually help or prevent this problem.  A mild laxative (Milk of Magnesia or Miralax) should be taken according to package directions if there are no bowel movements after 48 hours. 7. You may remove your bandages 24-48 hours after surgery, and you may shower at that time.  You have steri-strips (small skin tapes) in place directly over the incision.  These strips should be left on the skin for 7-10 days and then removed. 8. You may resume regular (light) daily activities beginning the next day-such as daily self-care, walking, climbing stairs-gradually increasing activities as tolerated.  You may have sexual intercourse when it is comfortable.  Refrain from any heavy lifting or straining until approved by your doctor.  You may drive when  you no longer are taking prescription pain medication, you can comfortably wear a seatbelt, and you can safely maneuver your car and apply brakes. 9. You should see your doctor in the office for a follow-up appointment approximately two to three weeks after your surgery.  Make sure that you call for this appointment within a day or two after you arrive home to insure a convenient appointment time.  WHEN TO CALL YOUR DOCTOR: 1. Fever greater than 101.5 2. Inability to urinate 3. Nausea and/or vomiting - persistent 4. Extreme swelling or bruising 5. Continued bleeding from incision 6. Increased pain, redness, or drainage from the incision 7. Difficulty swallowing or breathing 8. Muscle cramping or spasms 9. Numbness or tingling in hands or around lips  The clinic staff is available to answer your questions during regular business hours.  Please don't hesitate to call and ask to speak to one of the nurses if you have concerns. [facility] [phone]  [email or web address]   

## 2012-06-10 NOTE — Progress Notes (Signed)
General Surgery - [facility]  Chief Complaint  Patient presents with  . New Evaluation    Parathyroid adenoma - referral from Dr. Nida, [place], [place]    HISTORY: Patient is a 60-year-old white female referred by her primary care physician and her endocrinologist for evaluation of primary hyperparathyroidism. Patient was initially diagnosed approximately 2 years ago. She was noted to have hypercalcemia. Calcium levels have ranged from 10.4-10.8. Intact PTH levels have been elevated ranging from 35-94. Patient underwent a 24-hour urine collection with a markedly elevated urinary calcium level of 834. Patient has a history of bone density scanning showing osteopenia. Patient has undergone to nuclear medicine parathyroid scans and 2011 and again in August of 2013. Both studies demonstrate persistent uptake in the left inferior position consistent with parathyroid adenoma. Patient is now referred for consideration for minimally invasive surgery for parathyroidectomy.  Patient does have a history of anterior cervical spine fusion having undergone surgery in 2002 and 2009. She has a history of nephrolithiasis requiring lithotripsy on 2 occasions. She does have a left thyroid nodule measuring 1.8 cm seen on ultrasound exam. Previous fine-needle aspiration biopsy has shown this to be benign.  Patient notes symptoms of fatigue and bone and joint pain in addition to the findings noted above.  Past Medical History  Diagnosis Date  . Diabetes mellitus   . Asthma   . Generalized headaches   . Confusion   . Weakness   . Kidney stone      Current Outpatient Prescriptions  Medication Sig Dispense Refill  . aspirin 81 MG tablet Take 81 mg by mouth daily.      . Cholecalciferol (VITAMIN D-3 PO) Take 2,000 mg by mouth 2 (two) times daily.      . colesevelam (WELCHOL) 625 MG tablet Take 1,875 mg by mouth 2 (two) times daily with a meal.      . estradiol (ESTRACE) 0.5 MG tablet Daily.       . metFORMIN (GLUCOPHAGE-XR) 500 MG 24 hr tablet Daily.      . montelukast (SINGULAIR) 10 MG tablet Daily.      . NOVA MAX TEST test strip as needed.      . omeprazole (PRILOSEC) 20 MG capsule Daily.      . Pyrilamine-Phenylephrine (RU-HIST-D) 30-10 MG TABS Take by mouth daily. Patient takes 1/2 tablet daily      . omeprazole (PRILOSEC) 20 MG capsule Take 1 capsule (20 mg total) by mouth daily.  90 capsule  11     Allergies  Allergen Reactions  . Advair Diskus (Fluticasone-Salmeterol) Anaphylaxis  . Codeine Nausea And Vomiting  . Tetracyclines & Related Itching    Internal     Family History  Problem Relation Age of Onset  . Cancer Mother     pancreatic     History   Social History  . Marital Status: Married    Spouse Name: N/A    Number of Children: N/A  . Years of Education: N/A   Social History Main Topics  . Smoking status: Current Every Day Smoker -- 1.0 packs/day  . Smokeless tobacco: Never Used  . Alcohol Use: No  . Drug Use: No  . Sexually Active: None   Other Topics Concern  . None   Social History Narrative  . None     REVIEW OF SYSTEMS - PERTINENT POSITIVES ONLY: Chronic fatigue, bone and joint pain, history of nephrolithiasis, spine fusion surgery  EXAM: Filed Vitals:   06/10/12 1040    BP: 142/80  Pulse: 76  Temp: 97.8 F (36.6 C)  Resp: 16    HEENT: normocephalic; pupils equal and reactive; sclerae clear; dentition good; mucous membranes moist NECK:  Well-healed surgical incisions left anterior neck; no dominant or discrete palpable thyroid nodules; symmetric on extension; no palpable anterior or posterior cervical lymphadenopathy; no supraclavicular masses; no tenderness CHEST: clear to auscultation bilaterally without rales, rhonchi, or wheezes CARDIAC: regular rate and rhythm without significant murmur; peripheral pulses are full EXT:  non-tender without edema; no deformity NEURO: no gross focal deficits; no sign of  tremor   LABORATORY RESULTS: See [facility] (CHL-Epic) for most recent results   RADIOLOGY RESULTS: See [facility] (CHL-Epic) for most recent results   IMPRESSION: #1 primary hyperparathyroidism, 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 We discussed the option for neck exploration versus minimally invasive surgery. I have recommended minimally invasive parathyroidectomy for the left inferior parathyroid gland. We discussed the risk and benefits of the procedure including the potential for recurrent laryngeal nerve injury and the potential for conversion to neck exploration with 4 gland evaluation. They understand and wish to proceed. Written literature is provided to them to review.  The risks and benefits of the procedure have been discussed at length with the patient.  The patient understands the proposed procedure, potential alternative treatments, and the course of recovery to be expected.  All of the patient's questions have been answered at this time.  The patient wishes to proceed with surgery.  Detta Mellin M. Bryar Dahms, MD, FACS General & Endocrine Surgery [facility]   Visit Diagnoses: 1. Hyperparathyroidism, primary     Primary Care Physician: FUSCO,LAWRENCE J., MD   

## 2012-06-19 ENCOUNTER — Encounter (HOSPITAL_COMMUNITY): Payer: Self-pay | Admitting: Pharmacy Technician

## 2012-06-23 ENCOUNTER — Other Ambulatory Visit (INDEPENDENT_AMBULATORY_CARE_PROVIDER_SITE_OTHER): Payer: Self-pay | Admitting: Internal Medicine

## 2012-06-23 ENCOUNTER — Encounter (INDEPENDENT_AMBULATORY_CARE_PROVIDER_SITE_OTHER): Payer: Self-pay

## 2012-06-23 NOTE — Telephone Encounter (Signed)
Needs OV.  

## 2012-06-29 ENCOUNTER — Encounter (HOSPITAL_COMMUNITY)
Admission: RE | Admit: 2012-06-29 | Discharge: 2012-06-29 | Disposition: A | Discharge status: Home / Self Care | Payer: BC Managed Care – PPO | Source: Ambulatory Visit | Attending: Surgery | Admitting: Surgery

## 2012-06-29 ENCOUNTER — Ambulatory Visit (HOSPITAL_COMMUNITY)
Admission: RE | Admit: 2012-06-29 | Discharge: 2012-06-29 | Disposition: A | Discharge status: Home / Self Care | Payer: BC Managed Care – PPO | Source: Ambulatory Visit | Attending: Surgery | Admitting: Surgery

## 2012-06-29 ENCOUNTER — Encounter (HOSPITAL_COMMUNITY): Payer: Self-pay

## 2012-06-29 DIAGNOSIS — Z01810 Encounter for preprocedural cardiovascular examination: Secondary | ICD-10-CM | POA: Insufficient documentation

## 2012-06-29 DIAGNOSIS — Z01818 Encounter for other preprocedural examination: Secondary | ICD-10-CM | POA: Insufficient documentation

## 2012-06-29 DIAGNOSIS — Z01812 Encounter for preprocedural laboratory examination: Secondary | ICD-10-CM | POA: Insufficient documentation

## 2012-06-29 LAB — BASIC METABOLIC PANEL
CO2: 27 mEq/L (ref 19–32)
Chloride: 104 mEq/L (ref 96–112)
Creatinine, Ser: 0.7 mg/dL (ref 0.50–1.10)
GFR calc Af Amer: >90 mL/min (ref >90)
Potassium: 4.3 mEq/L (ref 3.5–5.1)

## 2012-06-29 LAB — CBC
HCT: 44.5 % (ref 36.0–46.0)
MCV: 84.3 fL (ref 78.0–100.0)
Platelets: 265 10*3/uL (ref 150–400)
RBC: 5.28 MIL/uL — ABNORMAL HIGH (ref 3.87–5.11)
RDW: 13.9 % (ref 11.5–15.5)
WBC: 6.5 10*3/uL (ref 4.0–10.5)

## 2012-06-29 LAB — SURGICAL PCR SCREEN: Staphylococcus aureus: NEGATIVE

## 2012-06-29 NOTE — Progress Notes (Signed)
Quick Note:  These results are acceptable for scheduled surgery.  Juanjose Mojica M. Joshwa Hemric, MD, FACS [facility] Office: [phone]   ______ 

## 2012-06-29 NOTE — Patient Instructions (Signed)
20      Your procedure is scheduled [id number] 07/02/2012 at  1200 pm  Report to [facility] at 0930 AM.  Call this number if you have problems the morning of surgery: [phone]   Remember:   Do not eat food or drink liquids after midnight!  Take these medicines the morning of surgery with A SIP OF WATER: None   Do not bring valuables to the hospital.  .  Leave suitcase in the car. After surgery it may be brought to your room.  For patients admitted to the hospital, checkout time is 11:00 AM the day of              Discharge.    Special Instructions: See [facility] Preparing  For Surgery Instruction Sheet. Do not wear jewelry, lotions powders, perfumes. Women do not shave  legs or underarms for 12 hours before showers. Contacts, partial plates, or dentures may not be worn into surgery.                          Patients discharged the day of surgery will not be allowed to drive home.  If going home the same day of surgery, must have someone stay with you first 24 hrs.at home and arrange for someone to drive you home from the              Hospital.   Please read over the following fact sheets that you were given: MRSA              INFORMATION               Telford Nab.Azalee Weimer,RN,BSN [phone]

## 2012-07-02 ENCOUNTER — Ambulatory Visit (HOSPITAL_COMMUNITY)
Admission: RE | Admit: 2012-07-02 | Discharge: 2012-07-02 | Disposition: A | Discharge status: Home / Self Care | Payer: BC Managed Care – PPO | Source: Ambulatory Visit | Attending: Surgery | Admitting: Surgery

## 2012-07-02 ENCOUNTER — Other Ambulatory Visit (INDEPENDENT_AMBULATORY_CARE_PROVIDER_SITE_OTHER): Payer: Self-pay

## 2012-07-02 ENCOUNTER — Encounter (HOSPITAL_COMMUNITY): Payer: Self-pay | Admitting: Anesthesiology

## 2012-07-02 ENCOUNTER — Encounter (HOSPITAL_COMMUNITY): Payer: Self-pay | Admitting: *Deleted

## 2012-07-02 ENCOUNTER — Encounter (HOSPITAL_COMMUNITY)
Admission: RE | Disposition: A | Discharge status: Home / Self Care | Payer: Self-pay | Source: Ambulatory Visit | Attending: Surgery

## 2012-07-02 ENCOUNTER — Ambulatory Visit (HOSPITAL_COMMUNITY): Payer: BC Managed Care – PPO | Admitting: Anesthesiology

## 2012-07-02 DIAGNOSIS — D351 Benign neoplasm of parathyroid gland: Secondary | ICD-10-CM | POA: Insufficient documentation

## 2012-07-02 DIAGNOSIS — E041 Nontoxic single thyroid nodule: Secondary | ICD-10-CM | POA: Insufficient documentation

## 2012-07-02 DIAGNOSIS — E119 Type 2 diabetes mellitus without complications: Secondary | ICD-10-CM | POA: Insufficient documentation

## 2012-07-02 DIAGNOSIS — F17200 Nicotine dependence, unspecified, uncomplicated: Secondary | ICD-10-CM | POA: Insufficient documentation

## 2012-07-02 DIAGNOSIS — Z981 Arthrodesis status: Secondary | ICD-10-CM | POA: Insufficient documentation

## 2012-07-02 DIAGNOSIS — E210 Primary hyperparathyroidism: Secondary | ICD-10-CM | POA: Insufficient documentation

## 2012-07-02 DIAGNOSIS — Z7982 Long term (current) use of aspirin: Secondary | ICD-10-CM | POA: Insufficient documentation

## 2012-07-02 DIAGNOSIS — Z79899 Other long term (current) drug therapy: Secondary | ICD-10-CM | POA: Insufficient documentation

## 2012-07-02 HISTORY — PX: PARATHYROIDECTOMY: SHX19

## 2012-07-02 LAB — GLUCOSE, CAPILLARY: Glucose-Capillary: 132 mg/dL — ABNORMAL HIGH (ref 70–99)

## 2012-07-02 SURGERY — PARATHYROIDECTOMY
Anesthesia: General | Site: Neck | Wound class: Clean

## 2012-07-02 MED ORDER — ACETAMINOPHEN 10 MG/ML IV SOLN
INTRAVENOUS | Status: AC
  Filled 2012-07-02: qty 100

## 2012-07-02 MED ORDER — OXYCODONE HCL 5 MG/5ML PO SOLN
5.0000 mg | Freq: Once | ORAL | Status: DC | PRN
  Filled 2012-07-02: qty 5

## 2012-07-02 MED ORDER — PROMETHAZINE HCL 25 MG/ML IJ SOLN
6.2500 mg | INTRAMUSCULAR | Status: AC | PRN
  Administered 2012-07-02 (×2): 6.25 mg via INTRAVENOUS

## 2012-07-02 MED ORDER — MIDAZOLAM HCL 5 MG/5ML IJ SOLN
INTRAMUSCULAR | Status: DC | PRN
  Administered 2012-07-02: 1 mg via INTRAVENOUS

## 2012-07-02 MED ORDER — FENTANYL CITRATE 0.05 MG/ML IJ SOLN
INTRAMUSCULAR | Status: DC | PRN
  Administered 2012-07-02 (×2): 50 ug via INTRAVENOUS

## 2012-07-02 MED ORDER — BUPIVACAINE HCL (PF) 0.5 % IJ SOLN
INTRAMUSCULAR | Status: AC
  Filled 2012-07-02: qty 30

## 2012-07-02 MED ORDER — SCOPOLAMINE 1 MG/3DAYS TD PT72
MEDICATED_PATCH | TRANSDERMAL | Status: AC
  Filled 2012-07-02: qty 1

## 2012-07-02 MED ORDER — ACETAMINOPHEN 10 MG/ML IV SOLN: 1000.0000 mg | Freq: Once | INTRAVENOUS | Status: DC | PRN

## 2012-07-02 MED ORDER — HYDROMORPHONE HCL PF 1 MG/ML IJ SOLN
INTRAMUSCULAR | Status: AC
  Filled 2012-07-02: qty 1

## 2012-07-02 MED ORDER — BUPIVACAINE HCL (PF) 0.5 % IJ SOLN
INTRAMUSCULAR | Status: DC | PRN
  Administered 2012-07-02: 30 mL

## 2012-07-02 MED ORDER — ONDANSETRON HCL 4 MG/2ML IJ SOLN
INTRAMUSCULAR | Status: DC | PRN
  Administered 2012-07-02: 2 mg via INTRAVENOUS

## 2012-07-02 MED ORDER — SCOPOLAMINE 1 MG/3DAYS TD PT72
1.0000 | MEDICATED_PATCH | TRANSDERMAL | Status: DC
  Administered 2012-07-02: 1.5 mg via TRANSDERMAL
  Filled 2012-07-02: qty 1

## 2012-07-02 MED ORDER — CEFAZOLIN SODIUM-DEXTROSE 2-3 GM-% IV SOLR
2.0000 g | INTRAVENOUS | Status: AC
  Administered 2012-07-02: 2 g via INTRAVENOUS

## 2012-07-02 MED ORDER — PROPOFOL 10 MG/ML IV BOLUS
INTRAVENOUS | Status: DC | PRN
  Administered 2012-07-02: 150 mg via INTRAVENOUS

## 2012-07-02 MED ORDER — OXYCODONE HCL 5 MG PO TABS: 5.0000 mg | ORAL_TABLET | Freq: Once | ORAL | Status: DC | PRN

## 2012-07-02 MED ORDER — MEPERIDINE HCL 50 MG/ML IJ SOLN: 6.2500 mg | INTRAMUSCULAR | Status: DC | PRN

## 2012-07-02 MED ORDER — LIDOCAINE HCL (CARDIAC) 20 MG/ML IV SOLN
INTRAVENOUS | Status: DC | PRN
  Administered 2012-07-02: 30 mg via INTRAVENOUS

## 2012-07-02 MED ORDER — ONDANSETRON HCL 4 MG/2ML IJ SOLN
4.0000 mg | Freq: Once | INTRAMUSCULAR | Status: AC | PRN
  Administered 2012-07-02: 4 mg via INTRAVENOUS

## 2012-07-02 MED ORDER — HYDROMORPHONE HCL PF 1 MG/ML IJ SOLN
0.2500 mg | INTRAMUSCULAR | Status: DC | PRN
  Administered 2012-07-02 (×2): 0.25 mg via INTRAVENOUS

## 2012-07-02 MED ORDER — EPHEDRINE SULFATE 50 MG/ML IJ SOLN
INTRAMUSCULAR | Status: DC | PRN
  Administered 2012-07-02: 5 mg via INTRAVENOUS

## 2012-07-02 MED ORDER — NEOSTIGMINE METHYLSULFATE 1 MG/ML IJ SOLN
INTRAMUSCULAR | Status: DC | PRN
  Administered 2012-07-02: 3 mg via INTRAVENOUS

## 2012-07-02 MED ORDER — CEFAZOLIN SODIUM-DEXTROSE 2-3 GM-% IV SOLR
INTRAVENOUS | Status: AC
  Filled 2012-07-02: qty 50

## 2012-07-02 MED ORDER — ONDANSETRON HCL 4 MG/2ML IJ SOLN
INTRAMUSCULAR | Status: AC
  Filled 2012-07-02: qty 2

## 2012-07-02 MED ORDER — CISATRACURIUM BESYLATE (PF) 10 MG/5ML IV SOLN
INTRAVENOUS | Status: DC | PRN
  Administered 2012-07-02: 7 mg via INTRAVENOUS
  Administered 2012-07-02: 3 mg via INTRAVENOUS

## 2012-07-02 MED ORDER — SUCCINYLCHOLINE CHLORIDE 20 MG/ML IJ SOLN
INTRAMUSCULAR | Status: DC | PRN
  Administered 2012-07-02: 100 mg via INTRAVENOUS

## 2012-07-02 MED ORDER — HYDROCODONE-ACETAMINOPHEN 5-325 MG PO TABS: 1.0000 | ORAL_TABLET | ORAL | Status: DC | PRN

## 2012-07-02 MED ORDER — ACETAMINOPHEN 10 MG/ML IV SOLN
INTRAVENOUS | Status: DC | PRN
  Administered 2012-07-02: 1000 mg via INTRAVENOUS

## 2012-07-02 MED ORDER — GLYCOPYRROLATE 0.2 MG/ML IJ SOLN
INTRAMUSCULAR | Status: DC | PRN
  Administered 2012-07-02: 0.4 mg via INTRAVENOUS

## 2012-07-02 MED ORDER — LACTATED RINGERS IV SOLN
INTRAVENOUS | Status: DC
  Administered 2012-07-02: 1000 mL via INTRAVENOUS
  Administered 2012-07-02 (×2): via INTRAVENOUS

## 2012-07-02 MED ORDER — PROMETHAZINE HCL 25 MG/ML IJ SOLN
INTRAMUSCULAR | Status: AC
  Filled 2012-07-02: qty 1

## 2012-07-02 SURGICAL SUPPLY — 40 items
ATTRACTOMAT 16X20 MAGNETIC DRP (DRAPES) ×2 IMPLANT
BENZOIN TINCTURE PRP APPL 2/3 (GAUZE/BANDAGES/DRESSINGS) ×2 IMPLANT
BLADE HEX COATED 2.75 (ELECTRODE) ×2 IMPLANT
BLADE SURG 15 STRL LF DISP TIS (BLADE) ×1 IMPLANT
BLADE SURG 15 STRL SS (BLADE) ×1
CANISTER SUCTION 2500CC (MISCELLANEOUS) ×2 IMPLANT
CHLORAPREP W/TINT 10.5 ML (MISCELLANEOUS) ×2 IMPLANT
CLIP TI MEDIUM 6 (CLIP) ×2 IMPLANT
CLIP TI WIDE RED SMALL 6 (CLIP) ×2 IMPLANT
CLOTH BEACON ORANGE TIMEOUT ST (SAFETY) ×2 IMPLANT
CLSR STERI-STRIP ANTIMIC 1/2X4 (GAUZE/BANDAGES/DRESSINGS) ×2 IMPLANT
DISSECTOR ROUND CHERRY 3/8 STR (MISCELLANEOUS) IMPLANT
DRAPE PED LAPAROTOMY (DRAPES) ×2 IMPLANT
DRESSING SURGICEL FIBRLLR 1X2 (HEMOSTASIS) ×1 IMPLANT
DRSG SURGICEL FIBRILLAR 1X2 (HEMOSTASIS) ×2
ELECT REM PT RETURN 9FT ADLT (ELECTROSURGICAL) ×2
ELECTRODE REM PT RTRN 9FT ADLT (ELECTROSURGICAL) ×1 IMPLANT
GAUZE SPONGE 4X4 16PLY XRAY LF (GAUZE/BANDAGES/DRESSINGS) ×2 IMPLANT
GLOVE SURG ORTHO 8.0 STRL STRW (GLOVE) ×2 IMPLANT
GOWN STRL NON-REIN LRG LVL3 (GOWN DISPOSABLE) IMPLANT
GOWN STRL REIN XL XLG (GOWN DISPOSABLE) ×8 IMPLANT
KIT BASIN OR (CUSTOM PROCEDURE TRAY) ×2 IMPLANT
NEEDLE HYPO 25X1 1.5 SAFETY (NEEDLE) ×2 IMPLANT
NS IRRIG 1000ML POUR BTL (IV SOLUTION) ×2 IMPLANT
PACK BASIC VI WITH GOWN DISP (CUSTOM PROCEDURE TRAY) ×2 IMPLANT
PENCIL BUTTON HOLSTER BLD 10FT (ELECTRODE) ×2 IMPLANT
SPONGE GAUZE 4X4 12PLY (GAUZE/BANDAGES/DRESSINGS) ×2 IMPLANT
STAPLER VISISTAT 35W (STAPLE) ×2 IMPLANT
STRIP CLOSURE SKIN 1/2X4 (GAUZE/BANDAGES/DRESSINGS) ×2 IMPLANT
SUT MNCRL AB 4-0 PS2 18 (SUTURE) ×2 IMPLANT
SUT SILK 2 0 (SUTURE) ×1
SUT SILK 2-0 18XBRD TIE 12 (SUTURE) ×1 IMPLANT
SUT SILK 3 0 (SUTURE)
SUT SILK 3-0 18XBRD TIE 12 (SUTURE) IMPLANT
SUT VIC AB 3-0 SH 18 (SUTURE) ×2 IMPLANT
SYR BULB IRRIGATION 50ML (SYRINGE) ×2 IMPLANT
SYR CONTROL 10ML LL (SYRINGE) ×2 IMPLANT
TAPE CLOTH SURG 4X10 WHT LF (GAUZE/BANDAGES/DRESSINGS) ×2 IMPLANT
TOWEL OR 17X26 10 PK STRL BLUE (TOWEL DISPOSABLE) ×2 IMPLANT
YANKAUER SUCT BULB TIP 10FT TU (MISCELLANEOUS) ×2 IMPLANT

## 2012-07-02 NOTE — Anesthesia Preprocedure Evaluation (Addendum)
Anesthesia Evaluation  Patient identified by MRN, date of birth, ID band Patient awake    Reviewed: Allergy & Precautions, H&P , NPO status , Patient's Chart, lab work & pertinent test results  History of Anesthesia Complications (+) PONV  Airway Mallampati: II TM Distance: >3 FB Neck ROM: Full    Dental  (+) Dental Advisory Given and Teeth Intact   Pulmonary asthma , Current Smoker,  breath sounds clear to auscultation  Pulmonary exam normal       Cardiovascular - CAD, - Past MI and - CHF Rhythm:Regular Rate:Normal     Neuro/Psych  Headaches,    GI/Hepatic Neg liver ROS, GERD-  Medicated,  Endo/Other  diabetes, Type 2, Oral Hypoglycemic Agents  Renal/GU Renal diseasenegative Renal ROS     Musculoskeletal   Abdominal   Peds  Hematology negative hematology ROS (+)   Anesthesia Other Findings   Reproductive/Obstetrics                          Anesthesia Physical Anesthesia Plan  ASA: III  Anesthesia Plan: General   Post-op Pain Management:    Induction:   Airway Management Planned: Oral ETT  Additional Equipment:   Intra-op Plan:   Post-operative Plan: Extubation in OR  Informed Consent: I have reviewed the patients History and Physical, chart, labs and discussed the procedure including the risks, benefits and alternatives for the proposed anesthesia with the patient or authorized representative who has indicated his/her understanding and acceptance.   Dental advisory given  Plan Discussed with: CRNA  Anesthesia Plan Comments:         Anesthesia Quick Evaluation

## 2012-07-02 NOTE — Interval H&P Note (Signed)
History and Physical Interval Note:  07/02/2012 11:29 AM  [name]  has presented today for surgery, with the diagnosis of PRIMARY HYPERPARATHYROIDISM.  The various methods of treatment have been discussed with the patient and family. After consideration of risks, benefits and other options for treatment, the patient has consented to:  Procedure(s) (LRB) with comments: PARATHYROIDECTOMY (N/A) as a surgical intervention .    The patient's history has been reviewed, patient examined, no change in status, stable for surgery.  I have reviewed the patient's chart and labs.  Questions were answered to the patient's satisfaction.    Velora Heckler, MD, [facility] Office: [phone]   [name] Judie Petit

## 2012-07-02 NOTE — Preoperative (Signed)
Beta Blockers   Reason not to administer Beta Blockers:Not Applicable 

## 2012-07-02 NOTE — Transfer of Care (Signed)
Immediate Anesthesia Transfer of Care Note  Patient: [name]  Procedure(s) Performed: Procedure(s) (LRB) with comments: PARATHYROIDECTOMY (N/A)  Patient Location: PACU  Anesthesia Type: General  Level of Consciousness: awake and sedated  Airway & Oxygen Therapy: Patient Spontanous Breathing, Patient connected to face mask oxygen and Patient connected to face mask  Post-op Assessment: Report given to PACU RN and Patient moving all extremities X 4  Post vital signs: Reviewed and stable  Complications: No apparent anesthesia complications

## 2012-07-02 NOTE — H&P (View-Only) (Signed)
General Surgery [facility]  Chief Complaint  Patient presents with  . New Evaluation    Parathyroid adenoma - referral from Dr. Kandee Keen, [place]    HISTORY: Patient is a 61 year old white female referred by her primary care physician and her endocrinologist for evaluation of primary hyperparathyroidism. Patient was initially diagnosed approximately 2 years ago. She was noted to have hypercalcemia. Calcium levels have ranged from 10.4-10.8. Intact PTH levels have been elevated ranging from 35-94. Patient underwent a 24-hour urine collection with a markedly elevated urinary calcium level of 834. Patient has a history of bone density scanning showing osteopenia. Patient has undergone to nuclear medicine parathyroid scans and 2011 and again in August of 2013. Both studies demonstrate persistent uptake in the left inferior position consistent with parathyroid adenoma. Patient is now referred for consideration for minimally invasive surgery for parathyroidectomy.  Patient does have a history of anterior cervical spine fusion having undergone surgery in 2002 and 2009. She has a history of nephrolithiasis requiring lithotripsy on 2 occasions. She does have a left thyroid nodule measuring 1.8 cm seen on ultrasound exam. Previous fine-needle aspiration biopsy has shown this to be benign.  Patient notes symptoms of fatigue and bone and joint pain in addition to the findings noted above.  Past Medical History  Diagnosis Date  . Diabetes mellitus   . Asthma   . Generalized headaches   . Confusion   . Weakness   . Kidney stone      Current Outpatient Prescriptions  Medication Sig Dispense Refill  . aspirin 81 MG tablet Take 81 mg by mouth daily.      . Cholecalciferol (VITAMIN D-3 PO) Take 2,000 mg by mouth 2 (two) times daily.      . colesevelam (WELCHOL) 625 MG tablet Take 1,875 mg by mouth 2 (two) times daily with a meal.      . estradiol (ESTRACE) 0.5 MG tablet Daily.       . metFORMIN (GLUCOPHAGE-XR) 500 MG 24 hr tablet Daily.      . montelukast (SINGULAIR) 10 MG tablet Daily.      Marland Kitchen NOVA MAX TEST test strip as needed.      Marland Kitchen omeprazole (PRILOSEC) 20 MG capsule Daily.      . Pyrilamine-Phenylephrine (RU-HIST-D) 30-10 MG TABS Take by mouth daily. Patient takes 1/2 tablet daily      . omeprazole (PRILOSEC) 20 MG capsule Take 1 capsule (20 mg total) by mouth daily.  90 capsule  11     Allergies  Allergen Reactions  . Advair Diskus (Fluticasone-Salmeterol) Anaphylaxis  . Codeine Nausea And Vomiting  . Tetracyclines & Related Itching    Internal     Family History  Problem Relation Age of Onset  . Cancer Mother     pancreatic     History   Social History  . Marital Status: Married    Spouse Name: N/A    Number of Children: N/A  . Years of Education: N/A   Social History Main Topics  . Smoking status: Current Every Day Smoker -- 1.0 packs/day  . Smokeless tobacco: Never Used  . Alcohol Use: No  . Drug Use: No  . Sexually Active: None   Other Topics Concern  . None   Social History Narrative  . None     REVIEW OF SYSTEMS - PERTINENT POSITIVES ONLY: Chronic fatigue, bone and joint pain, history of nephrolithiasis, spine fusion surgery  EXAM: Filed Vitals:   06/10/12 1040  BP: 142/80  Pulse: 76  Temp: 97.8 F (36.6 C)  Resp: 16    HEENT: normocephalic; pupils equal and reactive; sclerae clear; dentition good; mucous membranes moist NECK:  Well-healed surgical incisions left anterior neck; no dominant or discrete palpable thyroid nodules; symmetric on extension; no palpable anterior or posterior cervical lymphadenopathy; no supraclavicular masses; no tenderness CHEST: clear to auscultation bilaterally without rales, rhonchi, or wheezes CARDIAC: regular rate and rhythm without significant murmur; peripheral pulses are full EXT:  non-tender without edema; no deformity NEURO: no gross focal deficits; no sign of  tremor   LABORATORY RESULTS: See [facility] (CHL-Epic) for most recent results   RADIOLOGY RESULTS: See [facility] (CHL-Epic) for most recent results   IMPRESSION: #1 primary hyperparathyroidism, 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 We discussed the option for neck exploration versus minimally invasive surgery. I have recommended minimally invasive parathyroidectomy for the left inferior parathyroid gland. We discussed the risk and benefits of the procedure including the potential for recurrent laryngeal nerve injury and the potential for conversion to neck exploration with 4 gland evaluation. They understand and wish to proceed. Written literature is provided to them to review.  The risks and benefits of the procedure have been discussed at length with the patient.  The patient understands the proposed procedure, potential alternative treatments, and the course of recovery to be expected.  All of the patient's questions have been answered at this time.  The patient wishes to proceed with surgery.  Velora Heckler, MD, FACS General & Endocrine Surgery [facility]   Visit Diagnoses: 1. Hyperparathyroidism, primary     Primary Care Physician: Cassell Smiles., MD

## 2012-07-02 NOTE — Anesthesia Postprocedure Evaluation (Signed)
Anesthesia Post Note  Patient: [name]  Procedure(s) Performed: Procedure(s) (LRB): PARATHYROIDECTOMY (N/A)  Anesthesia type: General  Patient location: PACU  Post pain: Pain level controlled  Post assessment: Post-op Vital signs reviewed  Last Vitals: BP 166/66  Pulse 89  Temp 36.6 C (Oral)  Resp 12  SpO2 100%  Post vital signs: Reviewed  Level of consciousness: sedated  Complications: No apparent anesthesia complications

## 2012-07-02 NOTE — Op Note (Signed)
OPERATIVE REPORT - PARATHYROIDECTOMY  Preoperative diagnosis: Primary hyperparathyroidism  Postop diagnosis: Same  Procedure: Left inferior minimally invasive parathyroidectomy  Surgeon:  Velora Heckler, MD, FACS  Assistant:  Karie Soda, MD  Anesthesia: Gen. endotracheal  Estimated blood loss: Minimal  Preparation: ChloraPrep  Indications: Patient is a 61 year old white female referred by her primary care physician and her endocrinologist for evaluation of primary hyperparathyroidism. Patient was initially diagnosed approximately 2 years ago. She was noted to have hypercalcemia. Calcium levels have ranged from 10.4-10.8. Intact PTH levels have been elevated ranging from 35-94. Patient underwent a 24-hour urine collection with a markedly elevated urinary calcium level of 834. Patient has a history of bone density scanning showing osteopenia. Patient has undergone to nuclear medicine parathyroid scans and 2011 and again in August of 2013. Both studies demonstrate persistent uptake in the left inferior position consistent with parathyroid adenoma. Patient is now referred for consideration for minimally invasive surgery for parathyroidectomy.  Procedure: Patient was prepared in the holding area. He was brought to operating room and placed in a supine position on the operating room table. Following administration of general anesthesia, the patient was positioned and then prepped and draped in the usual strict aseptic fashion. After ascertaining that an adequate level of anesthesia been achieved, a neck incision is made with a #15 blade. Dissection was carried through subcutaneous tissues and platysma. Hemostasis was obtained with the electrocautery. Skin flaps were developed circumferentially and a Weitlander retractor was placed for exposure.  Strap muscles were incised in the midline. Strap muscles were reflected exposing the thyroid lobe. With gentle blunt dissection the thyroid lobe was  mobilized.  Dissection was carried through adipose tissue and an enlarged parathyroid gland is identified. It is gently mobilized. Vascular structures are divided between small and medium ligaclips. Care is taken to avoid the recurrent laryngeal nerve and the esophagus. Gland is completely excised. It is submitted to pathology. Frozen section confirms parathyroid tissue consistent with adenoma.  Neck is irrigated with warm saline. Good hemostasis is noted. Surgicel is placed in the operative field. Strap muscles are reapproximated in the midline with interrupted 3-0 Vicryl sutures. Platysma was closed with interrupted 3-0 Vicryl sutures. Skin is closed with a running 4-0 Monocryl subcuticular suture. Local anesthetic is infiltrated circumferentially. Wound was washed and dried and benzoin and Steri-Strips are applied. Sterile gauze dressing is applied. Patient is awakened from anesthesia and brought to the recovery room. The patient tolerated the procedure well.   Velora Heckler, MD, FACS General & Endocrine Surgery [facility]

## 2012-07-03 ENCOUNTER — Telehealth (INDEPENDENT_AMBULATORY_CARE_PROVIDER_SITE_OTHER): Payer: Self-pay | Admitting: General Surgery

## 2012-07-03 ENCOUNTER — Telehealth (INDEPENDENT_AMBULATORY_CARE_PROVIDER_SITE_OTHER): Payer: Self-pay

## 2012-07-03 ENCOUNTER — Encounter (HOSPITAL_COMMUNITY): Payer: Self-pay | Admitting: Surgery

## 2012-07-03 NOTE — Telephone Encounter (Signed)
[name] called to report that she started experiencing mild numbness around mouth and numbness/tingling at finger tips. I reviewed this with Dr. Gerrit Friends and he said for her to purchase either Calcium tablets or TUMS from pharmacy and take 2 tabs 3x a day, also VitaminD 1000-2000U and take once a day. I gave these instructions to patient and she said she is currently taking 2000U of vit D 2-3 x a day and she will continue/ she will call if symptoms do not improve. She will also contact on call dr. over the weekend if needed/gy

## 2012-07-03 NOTE — Telephone Encounter (Signed)
PO appt given to pt and lab slip mailed to pt . She will have labs drawn at Dr Fransico Him her endocrine md.

## 2012-07-03 NOTE — Progress Notes (Signed)
Quick Note:  Please contact patient and notify of benign pathology results.  Yisell Sprunger M. Kandy Towery, MD, FACS [facility] Office: [phone]   ______ 

## 2012-07-03 NOTE — Telephone Encounter (Signed)
Per Dr Gerkin's request pt notified of path result.  

## 2012-07-20 ENCOUNTER — Encounter (INDEPENDENT_AMBULATORY_CARE_PROVIDER_SITE_OTHER): Payer: Self-pay | Admitting: Surgery

## 2012-07-20 ENCOUNTER — Encounter (INDEPENDENT_AMBULATORY_CARE_PROVIDER_SITE_OTHER): Payer: Self-pay | Admitting: General Surgery

## 2012-07-20 ENCOUNTER — Ambulatory Visit (INDEPENDENT_AMBULATORY_CARE_PROVIDER_SITE_OTHER): Payer: BC Managed Care – PPO | Admitting: Surgery

## 2012-07-20 VITALS — BP 132/86 | HR 74 | Temp 97.4°F | Resp 18 | Ht 66.0 in | Wt 156.4 lb

## 2012-07-20 DIAGNOSIS — E210 Primary hyperparathyroidism: Secondary | ICD-10-CM

## 2012-07-20 NOTE — Progress Notes (Signed)
General Surgery [facility]  Visit Diagnoses: No diagnosis found.  HISTORY: Patient returns for her first postoperative visit having undergone minimally invasive parathyroidectomy on 07/02/2012. Final pathology shows a 1000 mg parathyroid adenoma. Followup laboratory work drawn at the office of her primary care physician on 07/14/2012, shows a normal intact PTH level of 31 and a normal serum calcium level of 8.9.  EXAM: Surgical incision is healing nicely. Voice quality is normal.  Mild soft tissue swelling. No sign of seroma. No sign of infection.  IMPRESSION: Primary hyperparathyroidism, status post minimally invasive parathyroidectomy with normalization of laboratory values  PLAN: Patient and I reviewed her pathology report and her laboratory studies. She will begin applying topical creams to her incision. She will be released back to work on October 28. She will return to see me for a final wound check in 6 weeks.  Velora Heckler, MD, FACS General & Endocrine Surgery [facility]

## 2012-07-20 NOTE — Patient Instructions (Signed)
  COCOA BUTTER & VITAMIN E CREAM  (Palmer's or other brand)  Apply cocoa butter/vitamin E cream to your incision 2 - 3 times daily.  Massage cream into incision for one minute with each application.  Use sunscreen (50 SPF or higher) for first 6 months after surgery if area is exposed to sun.  You may substitute Mederma or other scar reducing creams as desired.   

## 2012-07-28 ENCOUNTER — Encounter (INDEPENDENT_AMBULATORY_CARE_PROVIDER_SITE_OTHER): Payer: Self-pay

## 2012-08-31 ENCOUNTER — Ambulatory Visit (INDEPENDENT_AMBULATORY_CARE_PROVIDER_SITE_OTHER): Payer: BC Managed Care – PPO | Admitting: Surgery

## 2012-08-31 ENCOUNTER — Encounter (INDEPENDENT_AMBULATORY_CARE_PROVIDER_SITE_OTHER): Payer: Self-pay | Admitting: Surgery

## 2012-08-31 VITALS — BP 142/80 | HR 100 | Temp 97.8°F | Resp 18 | Ht 66.0 in | Wt 159.6 lb

## 2012-08-31 DIAGNOSIS — E210 Primary hyperparathyroidism: Secondary | ICD-10-CM

## 2012-08-31 NOTE — Progress Notes (Signed)
General Surgery [facility]  Visit Diagnoses: 1. Hyperparathyroidism, primary     HISTORY: Patient returns for her second postoperative visit having undergone parathyroidectomy. Overall she notes a marked improvement in her energy level.  EXAM: Incision is well healed. There is a small dry eschar at the right lateral extent. This is debrided. Antibiotic ointment and a Band-Aid are placed as dressing.  IMPRESSION: Status post parathyroidectomy  PLAN: Patient will follow up with her primary care physician and her endocrinologist in the spring. She will return to see [place] as needed. She will continue to apply topical creams to her incision.  Velora Heckler, MD, FACS General & Endocrine Surgery [facility]

## 2012-08-31 NOTE — Patient Instructions (Signed)
Use triple antibiotic ointment (store brand) for one week, then switch to cocoa butter cream.   COCOA BUTTER & VITAMIN E CREAM  (Palmer's or other brand)  Apply cocoa butter/vitamin E cream to your incision 2 - 3 times daily.  Massage cream into incision for one minute with each application.  Use sunscreen (50 SPF or higher) for first 6 months after surgery if area is exposed to sun.  You may substitute Mederma or other scar reducing creams as desired.

## 2012-10-15 ENCOUNTER — Encounter (INDEPENDENT_AMBULATORY_CARE_PROVIDER_SITE_OTHER): Payer: Self-pay | Admitting: Internal Medicine

## 2012-10-15 ENCOUNTER — Ambulatory Visit (INDEPENDENT_AMBULATORY_CARE_PROVIDER_SITE_OTHER): Payer: BC Managed Care – PPO | Admitting: Internal Medicine

## 2012-10-15 VITALS — BP 146/70 | HR 84 | Temp 98.3°F | Ht 66.0 in | Wt 159.5 lb

## 2012-10-15 DIAGNOSIS — G8929 Other chronic pain: Secondary | ICD-10-CM | POA: Insufficient documentation

## 2012-10-15 DIAGNOSIS — K589 Irritable bowel syndrome without diarrhea: Secondary | ICD-10-CM

## 2012-10-15 DIAGNOSIS — R1011 Right upper quadrant pain: Secondary | ICD-10-CM

## 2012-10-15 DIAGNOSIS — K219 Gastro-esophageal reflux disease without esophagitis: Secondary | ICD-10-CM | POA: Insufficient documentation

## 2012-10-15 MED ORDER — PANTOPRAZOLE SODIUM 40 MG PO TBEC: 40.0000 mg | DELAYED_RELEASE_TABLET | Freq: Every day | ORAL | Status: DC

## 2012-10-15 MED ORDER — DICYCLOMINE HCL 10 MG PO CAPS: 10.0000 mg | ORAL_CAPSULE | Freq: Two times a day (BID) | ORAL | Status: DC

## 2012-10-15 NOTE — Patient Instructions (Addendum)
Hepatic profile. Protonix 40mg  daily. # 30 with 3 refills. Dicyclomine 10mg  BID

## 2012-10-15 NOTE — Progress Notes (Signed)
Subjective:     Patient ID: [name], female   DOB: 1951/02/20, 63 y.o.   MRN: [id number]  HPIPresents today with c/o of pain rt upper quadrant pain for a couple of months. She tells me  The pain has been constant. If she bends over she will belch.  Her appetite is good. No weight loss. She has actually gained weight (3 pounds). No dysphagia. Occasionally belching.  Presently taking omeprazole for her acid reflux. She is taking about one a day of the [facility] Powder. She tells me she is having 3 a day. She has urgency. Her stools are formed. No melena or bright red rectal bleeding.  Occasionally light lower abdominal cramping. Sym[toms x 3 months. There is no pain.  01/26/09 EGD/Colonoscopy Dr. Karilyn Cota: (Recurrent epigastric and rt upper quadrant pain). LFTs have been normal. HIDA scan and Hepatobiliary [place] were normal.: Multiple erosions at the gastric body and antrum along with the scar at the angluaris.Changes appear to be secondary to excessive consumption of BC powder. Normal colonoscopy except for external hemorrhoids.  CMP     Component Value Date/Time   NA 138 06/29/2012 1005   K 4.3 06/29/2012 1005   CL 104 06/29/2012 1005   CO2 27 06/29/2012 1005   GLUCOSE 135* 06/29/2012 1005   BUN 10 06/29/2012 1005   CREATININE 0.70 06/29/2012 1005   CALCIUM 10.6* 06/29/2012 1005   GFRNONAA >90 06/29/2012 1005   GFRAA >90 06/29/2012 1005      Review of Systems see hpi  Current Outpatient Prescriptions  Medication Sig Dispense Refill  . Aspirin-Caffeine (BC FAST PAIN RELIEF ARTHRITIS) 1000-65 MG PACK Take 0.5 packets by mouth 2 (two) times daily.      . Cholecalciferol (VITAMIN D-3 PO) Take 2,000 mg by mouth 2 (two) times daily.      . colesevelam (WELCHOL) 625 MG tablet Take 625 mg by mouth daily.       . metFORMIN (GLUCOPHAGE-XR) 500 MG 24 hr tablet Take 500 mg by mouth daily after lunch.       . montelukast (SINGULAIR) 10 MG tablet Take 10 mg by mouth at bedtime.       Marland Kitchen omeprazole (PRILOSEC)  20 MG capsule TAKE 1 CAPSULE BY MOUTH ONCE A DAY  90 capsule  3  . Pyrilamine-Phenylephrine (RU-HIST-D) 30-10 MG TABS Take by mouth daily. Patient takes 1/2 tablet daily       Past Medical History  Diagnosis Date  . Diabetes mellitus   . Asthma   . Generalized headaches   . Confusion   . Weakness   . Kidney stone   . Herniated disc, cervical         Objective:   Physical Exam Filed Vitals:   10/15/12 1008  BP: 146/70  Pulse: 84  Temp: 98.3 F (36.8 C)  Height: 5\' 6"  (1.676 m)  Weight: 159 lb 8 oz (72.349 kg)   Alert and oriented. Skin warm and dry. Oral mucosa is moist.   . Sclera anicteric, conjunctivae is pink. Thyroid not enlarged. No cervical lymphadenopathy. Lungs clear. Heart regular rate and rhythm.  Abdomen is soft. Bowel sounds are positive. No hepatomegaly. No abdominal masses felt. No tenderness today.  No edema to lower extremities.   Stool brown and guaiac negative.     Assessment:    Possible IBS/change in stools. Stools however are normal caliber. No melena or bright red rectal bleeding. Heme negative in office today. Last colonoscopy was normal. Possible GERD. Admits to taking  BC powder x 1 a day.   Rt upper quadrant pain probably related to GERD. Doubt liver disease given past hx of same.  Plan:     *Hepatic profile, PR in 2 weeks Dicyclomine 10mg  BID. Protonix 40mg   Daily.

## 2012-10-22 ENCOUNTER — Encounter (INDEPENDENT_AMBULATORY_CARE_PROVIDER_SITE_OTHER): Payer: Self-pay

## 2012-10-30 ENCOUNTER — Telehealth (INDEPENDENT_AMBULATORY_CARE_PROVIDER_SITE_OTHER): Payer: Self-pay | Admitting: *Deleted

## 2012-10-30 NOTE — Telephone Encounter (Signed)
[name] stating she is doing much better. She did f/u with her PCP, Dr. Sherwood Gambler for her BP. Alle's BP has been running hight and was given a medication for this. He also stopped her Metformin and started her on something else. Eilah didn't mention the names of her new medications. The return phone number is [phone].

## 2012-11-03 ENCOUNTER — Telehealth (INDEPENDENT_AMBULATORY_CARE_PROVIDER_SITE_OTHER): Payer: Self-pay | Admitting: *Deleted

## 2012-11-03 ENCOUNTER — Encounter (INDEPENDENT_AMBULATORY_CARE_PROVIDER_SITE_OTHER): Payer: Self-pay

## 2012-11-03 NOTE — Telephone Encounter (Signed)
[name] said Dr. Sherwood Gambler has changed some of her medications and she is having stomach issue again. Would like to speak with [name] and is if she thinks the new medicines could be interacting with each other. The return phone number is [phone].

## 2012-11-04 ENCOUNTER — Telehealth (INDEPENDENT_AMBULATORY_CARE_PROVIDER_SITE_OTHER): Payer: Self-pay | Admitting: *Deleted

## 2012-11-04 NOTE — Telephone Encounter (Signed)
This has been addressed in another telephone encounter.  

## 2012-11-04 NOTE — Telephone Encounter (Signed)
Dr Sherwood Gambler changed her Metformin and she thinks this is upsetting her stomach. She wants to try Omeprazole again. I advised her that Protonix was a good medicine but she stiill wants to try the Omeprazole.  She will call with a PR in a week and let me know.

## 2012-11-04 NOTE — Telephone Encounter (Signed)
Patient left message at 10:08 am  She had seen you few weeks back and you had changed her medication to omeperazole.  She has gone back to see Dr. Sherwood Gambler and her adjusted some of her medications and added some.  She now is having gastric distress again. Sore throat.    She wants to know if she can change back to pantoprazole.  Then see if see if she can come off the other medications added to find which one is causing her the issues.  Please call her and advise what she should do.  Should she call Dr. Sherwood Gambler and discuss this issue or can you advise her?

## 2012-11-11 NOTE — Telephone Encounter (Signed)
Noted  

## 2013-06-30 ENCOUNTER — Other Ambulatory Visit (INDEPENDENT_AMBULATORY_CARE_PROVIDER_SITE_OTHER): Payer: Self-pay | Admitting: Internal Medicine

## 2013-10-07 ENCOUNTER — Other Ambulatory Visit: Payer: Self-pay | Admitting: Neurosurgery

## 2013-10-08 ENCOUNTER — Encounter (HOSPITAL_COMMUNITY): Payer: Self-pay | Admitting: Respiratory Therapy

## 2013-10-13 ENCOUNTER — Encounter (HOSPITAL_COMMUNITY)
Admission: RE | Admit: 2013-10-13 | Discharge: 2013-10-13 | Disposition: A | Discharge status: Home / Self Care | Payer: BC Managed Care – PPO | Source: Ambulatory Visit | Attending: Neurosurgery | Admitting: Neurosurgery

## 2013-10-13 ENCOUNTER — Ambulatory Visit (HOSPITAL_COMMUNITY)
Admission: RE | Admit: 2013-10-13 | Discharge: 2013-10-13 | Disposition: A | Discharge status: Home / Self Care | Payer: BC Managed Care – PPO | Source: Ambulatory Visit | Attending: Anesthesiology | Admitting: Anesthesiology

## 2013-10-13 ENCOUNTER — Encounter (HOSPITAL_COMMUNITY): Payer: Self-pay

## 2013-10-13 DIAGNOSIS — R9431 Abnormal electrocardiogram [ECG] [EKG]: Secondary | ICD-10-CM | POA: Insufficient documentation

## 2013-10-13 DIAGNOSIS — Z01810 Encounter for preprocedural cardiovascular examination: Secondary | ICD-10-CM | POA: Insufficient documentation

## 2013-10-13 DIAGNOSIS — Z01812 Encounter for preprocedural laboratory examination: Secondary | ICD-10-CM | POA: Insufficient documentation

## 2013-10-13 DIAGNOSIS — Z01818 Encounter for other preprocedural examination: Secondary | ICD-10-CM | POA: Insufficient documentation

## 2013-10-13 HISTORY — DX: Trigger finger, unspecified finger: M65.30

## 2013-10-13 HISTORY — DX: Other specified postprocedural states: Z98.890

## 2013-10-13 HISTORY — DX: Personal history of urinary calculi: Z87.442

## 2013-10-13 HISTORY — DX: Gastro-esophageal reflux disease without esophagitis: K21.9

## 2013-10-13 HISTORY — DX: Pain in unspecified joint: M25.50

## 2013-10-13 HISTORY — DX: Unspecified osteoarthritis, unspecified site: M19.90

## 2013-10-13 HISTORY — DX: Nausea with vomiting, unspecified: R11.2

## 2013-10-13 HISTORY — DX: Hyperlipidemia, unspecified: E78.5

## 2013-10-13 HISTORY — DX: Hypothyroidism, unspecified: E03.9

## 2013-10-13 HISTORY — DX: Transient cerebral ischemic attack, unspecified: G45.9

## 2013-10-13 HISTORY — DX: Nocturia: R35.1

## 2013-10-13 HISTORY — DX: Unspecified convulsions: R56.9

## 2013-10-13 HISTORY — DX: Essential (primary) hypertension: I10

## 2013-10-13 HISTORY — DX: Irritable bowel syndrome, unspecified: K58.9

## 2013-10-13 LAB — SURGICAL PCR SCREEN
MRSA, PCR: NEGATIVE
Staphylococcus aureus: NEGATIVE

## 2013-10-13 LAB — BASIC METABOLIC PANEL
BUN: 16 mg/dL (ref 6–23)
CALCIUM: 9.6 mg/dL (ref 8.4–10.5)
CO2: 27 mEq/L (ref 19–32)
CREATININE: 0.73 mg/dL (ref 0.50–1.10)
Chloride: 99 mEq/L (ref 96–112)
GFR calc Af Amer: >90 mL/min (ref >90)
GFR, EST NON AFRICAN AMERICAN: 88 mL/min — AB (ref >90)
GLUCOSE: 164 mg/dL — AB (ref 70–99)
POTASSIUM: 4.5 meq/L (ref 3.7–5.3)
Sodium: 139 mEq/L (ref 137–147)

## 2013-10-13 LAB — TYPE AND SCREEN
ABO/RH(D): B POS
ANTIBODY SCREEN: NEGATIVE

## 2013-10-13 LAB — CBC
HCT: 44.2 % (ref 36.0–46.0)
HEMOGLOBIN: 15.1 g/dL — AB (ref 12.0–15.0)
MCH: 29 pg (ref 26.0–34.0)
MCHC: 34.2 g/dL (ref 30.0–36.0)
MCV: 85 fL (ref 78.0–100.0)
PLATELETS: 270 10*3/uL (ref 150–400)
RBC: 5.2 MIL/uL — ABNORMAL HIGH (ref 3.87–5.11)
RDW: 14.3 % (ref 11.5–15.5)
WBC: 8.6 10*3/uL (ref 4.0–10.5)

## 2013-10-13 LAB — ABO/RH: ABO/RH(D): B POS

## 2013-10-13 NOTE — Pre-Procedure Instructions (Signed)
Kiyani Jernigan Boettcher  10/13/2013   Your procedure is scheduled on:  Mon, Jan 19 @ 1:20 PM  Report to [facility] Short Stay Entrance A  at 10:15 AM.  Call this number if you have problems the morning of surgery: [phone]   Remember:   Do not eat food or drink liquids after midnight.   Take these medicines the morning of surgery with A SIP OF WATER: Synthroid(Levothyroxine) and Omeprazole(Prilosec)              No Goody's,BC's,Aleve,Aspirin,Ibuprofen,Fish Oil,or any Herbal Mediations   Do not wear jewelry, make-up or nail polish.  Do not wear lotions, powders, or perfumes. You may wear deodorant.  Do not shave 48 hours prior to surgery.   Do not bring valuables to the hospital.  [facility] is not responsible                  for any belongings or valuables.               Contacts, dentures or bridgework may not be worn into surgery.  Leave suitcase in the car. After surgery it may be brought to your room.  For patients admitted to the hospital, discharge time is determined by your                treatment team.               Special Instructions: Shower using CHG 2 nights before surgery and the night before surgery.  If you shower the day of surgery use CHG.  Use special wash - you have one bottle of CHG for all showers.  You should use approximately 1/3 of the bottle for each shower.   Please read over the following fact sheets that you were given: Pain Booklet, Coughing and Deep Breathing, Blood Transfusion Information, MRSA Information and Surgical Site Infection Prevention

## 2013-10-13 NOTE — Progress Notes (Signed)
Pt doesn't have a cardiologist  Stress test done at least 68yrs ago   Denies ever having an echo or heart cath  Dr.Fusco is Medical MD   Denies EKG or CXR in past yr

## 2013-10-17 MED ORDER — CEFAZOLIN SODIUM-DEXTROSE 2-3 GM-% IV SOLR
2.0000 g | INTRAVENOUS | Status: AC
  Administered 2013-10-18: 2 g via INTRAVENOUS
  Filled 2013-10-17: qty 50

## 2013-10-18 ENCOUNTER — Inpatient Hospital Stay (HOSPITAL_COMMUNITY): Payer: BC Managed Care – PPO

## 2013-10-18 ENCOUNTER — Encounter (HOSPITAL_COMMUNITY): Payer: BC Managed Care – PPO | Admitting: Anesthesiology

## 2013-10-18 ENCOUNTER — Inpatient Hospital Stay (HOSPITAL_COMMUNITY)
Admission: RE | Admit: 2013-10-18 | Discharge: 2013-10-20 | DRG: 473 | Disposition: A | Discharge status: Home / Self Care | Payer: BC Managed Care – PPO | Source: Ambulatory Visit | Attending: Neurosurgery | Admitting: Neurosurgery

## 2013-10-18 ENCOUNTER — Encounter (HOSPITAL_COMMUNITY)
Admission: RE | Disposition: A | Discharge status: Home / Self Care | Payer: Self-pay | Source: Ambulatory Visit | Attending: Neurosurgery

## 2013-10-18 ENCOUNTER — Encounter (HOSPITAL_COMMUNITY): Payer: Self-pay | Admitting: Anesthesiology

## 2013-10-18 ENCOUNTER — Inpatient Hospital Stay (HOSPITAL_COMMUNITY): Payer: BC Managed Care – PPO | Admitting: Anesthesiology

## 2013-10-18 DIAGNOSIS — E210 Primary hyperparathyroidism: Secondary | ICD-10-CM | POA: Diagnosis present

## 2013-10-18 DIAGNOSIS — J45909 Unspecified asthma, uncomplicated: Secondary | ICD-10-CM | POA: Diagnosis present

## 2013-10-18 DIAGNOSIS — K589 Irritable bowel syndrome without diarrhea: Secondary | ICD-10-CM | POA: Diagnosis present

## 2013-10-18 DIAGNOSIS — Z472 Encounter for removal of internal fixation device: Secondary | ICD-10-CM

## 2013-10-18 DIAGNOSIS — E039 Hypothyroidism, unspecified: Secondary | ICD-10-CM | POA: Diagnosis present

## 2013-10-18 DIAGNOSIS — Z79899 Other long term (current) drug therapy: Secondary | ICD-10-CM

## 2013-10-18 DIAGNOSIS — K219 Gastro-esophageal reflux disease without esophagitis: Secondary | ICD-10-CM | POA: Diagnosis present

## 2013-10-18 DIAGNOSIS — Z8673 Personal history of transient ischemic attack (TIA), and cerebral infarction without residual deficits: Secondary | ICD-10-CM

## 2013-10-18 DIAGNOSIS — F17200 Nicotine dependence, unspecified, uncomplicated: Secondary | ICD-10-CM | POA: Diagnosis present

## 2013-10-18 DIAGNOSIS — E785 Hyperlipidemia, unspecified: Secondary | ICD-10-CM | POA: Diagnosis present

## 2013-10-18 DIAGNOSIS — Z87891 Personal history of nicotine dependence: Secondary | ICD-10-CM

## 2013-10-18 DIAGNOSIS — Z794 Long term (current) use of insulin: Secondary | ICD-10-CM

## 2013-10-18 DIAGNOSIS — M5020 Other cervical disc displacement, unspecified cervical region: Principal | ICD-10-CM | POA: Diagnosis present

## 2013-10-18 DIAGNOSIS — E119 Type 2 diabetes mellitus without complications: Secondary | ICD-10-CM | POA: Diagnosis present

## 2013-10-18 DIAGNOSIS — I10 Essential (primary) hypertension: Secondary | ICD-10-CM | POA: Diagnosis present

## 2013-10-18 DIAGNOSIS — M47812 Spondylosis without myelopathy or radiculopathy, cervical region: Secondary | ICD-10-CM | POA: Diagnosis present

## 2013-10-18 DIAGNOSIS — M5030 Other cervical disc degeneration, unspecified cervical region: Secondary | ICD-10-CM | POA: Diagnosis present

## 2013-10-18 HISTORY — PX: ANTERIOR CERVICAL DECOMP/DISCECTOMY FUSION: SHX1161

## 2013-10-18 LAB — GLUCOSE, CAPILLARY
Glucose-Capillary: 173 mg/dL — ABNORMAL HIGH (ref 70–99)
Glucose-Capillary: 216 mg/dL — ABNORMAL HIGH (ref 70–99)
Glucose-Capillary: 227 mg/dL — ABNORMAL HIGH (ref 70–99)
Glucose-Capillary: 263 mg/dL — ABNORMAL HIGH (ref 70–99)

## 2013-10-18 SURGERY — ANTERIOR CERVICAL DECOMPRESSION/DISCECTOMY FUSION 3 LEVELS
Anesthesia: General | Site: Neck

## 2013-10-18 MED ORDER — BISACODYL 10 MG RE SUPP: 10.0000 mg | Freq: Every day | RECTAL | Status: DC | PRN

## 2013-10-18 MED ORDER — MONTELUKAST SODIUM 10 MG PO TABS
10.0000 mg | ORAL_TABLET | Freq: Every day | ORAL | Status: DC
  Administered 2013-10-18 – 2013-10-19 (×2): 10 mg via ORAL
  Filled 2013-10-18 (×3): qty 1

## 2013-10-18 MED ORDER — INSULIN DETEMIR 100 UNIT/ML ~~LOC~~ SOLN
20.0000 [IU] | Freq: Every day | SUBCUTANEOUS | Status: DC
  Administered 2013-10-18 – 2013-10-19 (×2): 20 [IU] via SUBCUTANEOUS
  Filled 2013-10-18 (×3): qty 0.2

## 2013-10-18 MED ORDER — COLESEVELAM HCL 625 MG PO TABS
625.0000 mg | ORAL_TABLET | Freq: Every day | ORAL | Status: DC
  Administered 2013-10-19: 625 mg via ORAL
  Filled 2013-10-18 (×3): qty 1

## 2013-10-18 MED ORDER — 0.9 % SODIUM CHLORIDE (POUR BTL) OPTIME
TOPICAL | Status: DC | PRN
  Administered 2013-10-18: 1000 mL

## 2013-10-18 MED ORDER — PROPOFOL INFUSION 10 MG/ML OPTIME
INTRAVENOUS | Status: DC | PRN
  Administered 2013-10-18: 25 ug/kg/min via INTRAVENOUS

## 2013-10-18 MED ORDER — OXYCODONE HCL 5 MG/5ML PO SOLN: 5.0000 mg | Freq: Once | ORAL | Status: DC | PRN

## 2013-10-18 MED ORDER — LEVOTHYROXINE SODIUM 50 MCG PO TABS
50.0000 ug | ORAL_TABLET | Freq: Every day | ORAL | Status: DC
  Administered 2013-10-19 – 2013-10-20 (×2): 50 ug via ORAL
  Filled 2013-10-18 (×3): qty 1

## 2013-10-18 MED ORDER — ENALAPRIL MALEATE 10 MG PO TABS
10.0000 mg | ORAL_TABLET | Freq: Every day | ORAL | Status: DC
  Administered 2013-10-18 – 2013-10-19 (×2): 10 mg via ORAL
  Filled 2013-10-18 (×3): qty 1

## 2013-10-18 MED ORDER — OXYCODONE HCL 5 MG PO TABS: 5.0000 mg | ORAL_TABLET | Freq: Once | ORAL | Status: DC | PRN

## 2013-10-18 MED ORDER — KETOROLAC TROMETHAMINE 30 MG/ML IJ SOLN: 30.0000 mg | Freq: Once | INTRAMUSCULAR | Status: DC

## 2013-10-18 MED ORDER — OXYCODONE-ACETAMINOPHEN 5-325 MG PO TABS: 1.0000 | ORAL_TABLET | ORAL | Status: DC | PRN

## 2013-10-18 MED ORDER — INSULIN ASPART 100 UNIT/ML ~~LOC~~ SOLN
0.0000 [IU] | Freq: Every day | SUBCUTANEOUS | Status: DC
  Administered 2013-10-18: 3 [IU] via SUBCUTANEOUS

## 2013-10-18 MED ORDER — ARTIFICIAL TEARS OP OINT
TOPICAL_OINTMENT | OPHTHALMIC | Status: DC | PRN
  Administered 2013-10-18: 1 via OPHTHALMIC

## 2013-10-18 MED ORDER — HYDROMORPHONE HCL PF 1 MG/ML IJ SOLN
INTRAMUSCULAR | Status: AC
  Filled 2013-10-18: qty 1

## 2013-10-18 MED ORDER — TRAMADOL HCL 50 MG PO TABS
50.0000 mg | ORAL_TABLET | Freq: Four times a day (QID) | ORAL | Status: DC | PRN
  Administered 2013-10-18 – 2013-10-20 (×4): 100 mg via ORAL
  Administered 2013-10-20: 50 mg via ORAL
  Filled 2013-10-18: qty 1
  Filled 2013-10-18 (×2): qty 2
  Filled 2013-10-18: qty 1
  Filled 2013-10-18: qty 2
  Filled 2013-10-18: qty 1

## 2013-10-18 MED ORDER — SODIUM CHLORIDE 0.9 % IJ SOLN
3.0000 mL | Freq: Two times a day (BID) | INTRAMUSCULAR | Status: DC
  Administered 2013-10-18 – 2013-10-19 (×3): 3 mL via INTRAVENOUS

## 2013-10-18 MED ORDER — THROMBIN 5000 UNITS EX SOLR
OROMUCOSAL | Status: DC | PRN
  Administered 2013-10-18: 14:00:00 via TOPICAL

## 2013-10-18 MED ORDER — SURGIFOAM 100 EX MISC
CUTANEOUS | Status: DC | PRN
  Administered 2013-10-18: 13:00:00 via TOPICAL

## 2013-10-18 MED ORDER — HYDROXYZINE HCL 25 MG PO TABS: 50.0000 mg | ORAL_TABLET | ORAL | Status: DC | PRN

## 2013-10-18 MED ORDER — PHENYLEPHRINE HCL 10 MG/ML IJ SOLN
INTRAMUSCULAR | Status: DC | PRN
  Administered 2013-10-18: 80 ug via INTRAVENOUS
  Administered 2013-10-18: 120 ug via INTRAVENOUS

## 2013-10-18 MED ORDER — SODIUM CHLORIDE 0.9 % IJ SOLN: 3.0000 mL | INTRAMUSCULAR | Status: DC | PRN

## 2013-10-18 MED ORDER — GLYCOPYRROLATE 0.2 MG/ML IJ SOLN
INTRAMUSCULAR | Status: DC | PRN
  Administered 2013-10-18: 0.6 mg via INTRAVENOUS

## 2013-10-18 MED ORDER — CYCLOBENZAPRINE HCL 10 MG PO TABS
10.0000 mg | ORAL_TABLET | Freq: Three times a day (TID) | ORAL | Status: DC | PRN
Start: 2013-10-18 — End: 2013-10-20
  Administered 2013-10-18: 10 mg via ORAL

## 2013-10-18 MED ORDER — HYDROCODONE-ACETAMINOPHEN 5-325 MG PO TABS: 1.0000 | ORAL_TABLET | ORAL | Status: DC | PRN

## 2013-10-18 MED ORDER — PANTOPRAZOLE SODIUM 40 MG PO TBEC
40.0000 mg | DELAYED_RELEASE_TABLET | Freq: Every day | ORAL | Status: DC
  Administered 2013-10-18 – 2013-10-19 (×2): 40 mg via ORAL
  Filled 2013-10-18 (×2): qty 1

## 2013-10-18 MED ORDER — MORPHINE SULFATE 4 MG/ML IJ SOLN: 4.0000 mg | INTRAMUSCULAR | Status: DC | PRN

## 2013-10-18 MED ORDER — SODIUM CHLORIDE 0.9 % IV SOLN: INTRAVENOUS | Status: DC

## 2013-10-18 MED ORDER — OXYCODONE HCL 5 MG PO TABS
ORAL_TABLET | ORAL | Status: AC
  Filled 2013-10-18: qty 1

## 2013-10-18 MED ORDER — SODIUM CHLORIDE 0.9 % IR SOLN
Status: DC | PRN
  Administered 2013-10-18: 13:00:00

## 2013-10-18 MED ORDER — INSULIN ASPART 100 UNIT/ML ~~LOC~~ SOLN
0.0000 [IU] | Freq: Three times a day (TID) | SUBCUTANEOUS | Status: DC
  Administered 2013-10-19 – 2013-10-20 (×4): 3 [IU] via SUBCUTANEOUS

## 2013-10-18 MED ORDER — HYDROMORPHONE HCL PF 1 MG/ML IJ SOLN
0.2500 mg | INTRAMUSCULAR | Status: DC | PRN
  Administered 2013-10-18 (×2): 0.5 mg via INTRAVENOUS

## 2013-10-18 MED ORDER — PROMETHAZINE HCL 25 MG/ML IJ SOLN: 6.2500 mg | INTRAMUSCULAR | Status: DC | PRN

## 2013-10-18 MED ORDER — MAGNESIUM HYDROXIDE 400 MG/5ML PO SUSP
30.0000 mL | Freq: Every day | ORAL | Status: DC | PRN
Start: 2013-10-18 — End: 2013-10-20

## 2013-10-18 MED ORDER — MIDAZOLAM HCL 5 MG/5ML IJ SOLN
INTRAMUSCULAR | Status: DC | PRN
  Administered 2013-10-18: 2 mg via INTRAVENOUS

## 2013-10-18 MED ORDER — KETOROLAC TROMETHAMINE 30 MG/ML IJ SOLN
INTRAMUSCULAR | Status: AC
  Filled 2013-10-18: qty 1

## 2013-10-18 MED ORDER — ROCURONIUM BROMIDE 100 MG/10ML IV SOLN
INTRAVENOUS | Status: DC | PRN
  Administered 2013-10-18: 50 mg via INTRAVENOUS

## 2013-10-18 MED ORDER — VECURONIUM BROMIDE 10 MG IV SOLR
INTRAVENOUS | Status: DC | PRN
  Administered 2013-10-18: 2 mg via INTRAVENOUS
  Administered 2013-10-18: 3 mg via INTRAVENOUS
  Administered 2013-10-18: 1 mg via INTRAVENOUS

## 2013-10-18 MED ORDER — LIDOCAINE-EPINEPHRINE 1 %-1:100000 IJ SOLN
INTRAMUSCULAR | Status: DC | PRN
  Administered 2013-10-18: 8.5 mL

## 2013-10-18 MED ORDER — NEOSTIGMINE METHYLSULFATE 1 MG/ML IJ SOLN
INTRAMUSCULAR | Status: DC | PRN
  Administered 2013-10-18: 4 mg via INTRAVENOUS

## 2013-10-18 MED ORDER — SCOPOLAMINE 1 MG/3DAYS TD PT72
MEDICATED_PATCH | TRANSDERMAL | Status: AC
  Administered 2013-10-18: 1 via TRANSDERMAL
  Filled 2013-10-18: qty 1

## 2013-10-18 MED ORDER — MIDAZOLAM HCL 2 MG/2ML IJ SOLN: 0.5000 mg | Freq: Once | INTRAMUSCULAR | Status: DC | PRN

## 2013-10-18 MED ORDER — LACTATED RINGERS IV SOLN
INTRAVENOUS | Status: DC
  Administered 2013-10-18: 10:00:00 via INTRAVENOUS

## 2013-10-18 MED ORDER — BUPIVACAINE HCL (PF) 0.5 % IJ SOLN
INTRAMUSCULAR | Status: DC | PRN
  Administered 2013-10-18: 8.5 mL

## 2013-10-18 MED ORDER — ONDANSETRON HCL 4 MG/2ML IJ SOLN
INTRAMUSCULAR | Status: DC | PRN
  Administered 2013-10-18 (×2): 4 mg via INTRAVENOUS

## 2013-10-18 MED ORDER — ACETAMINOPHEN 650 MG RE SUPP: 650.0000 mg | RECTAL | Status: DC | PRN

## 2013-10-18 MED ORDER — METFORMIN HCL 500 MG PO TABS
500.0000 mg | ORAL_TABLET | Freq: Two times a day (BID) | ORAL | Status: DC
  Administered 2013-10-19 – 2013-10-20 (×3): 500 mg via ORAL
  Filled 2013-10-18 (×6): qty 1

## 2013-10-18 MED ORDER — LACTATED RINGERS IV SOLN
INTRAVENOUS | Status: DC | PRN
  Administered 2013-10-18 (×3): via INTRAVENOUS

## 2013-10-18 MED ORDER — ACETAMINOPHEN 325 MG PO TABS: 650.0000 mg | ORAL_TABLET | ORAL | Status: DC | PRN

## 2013-10-18 MED ORDER — KETOROLAC TROMETHAMINE 30 MG/ML IJ SOLN
30.0000 mg | Freq: Four times a day (QID) | INTRAMUSCULAR | Status: DC
  Administered 2013-10-18 – 2013-10-20 (×7): 30 mg via INTRAVENOUS
  Filled 2013-10-18 (×8): qty 1

## 2013-10-18 MED ORDER — MEPERIDINE HCL 25 MG/ML IJ SOLN: 6.2500 mg | INTRAMUSCULAR | Status: DC | PRN

## 2013-10-18 MED ORDER — ALUM & MAG HYDROXIDE-SIMETH 200-200-20 MG/5ML PO SUSP
30.0000 mL | Freq: Four times a day (QID) | ORAL | Status: DC | PRN
Start: 2013-10-18 — End: 2013-10-20

## 2013-10-18 MED ORDER — ZOLPIDEM TARTRATE 5 MG PO TABS: 5.0000 mg | ORAL_TABLET | Freq: Every evening | ORAL | Status: DC | PRN

## 2013-10-18 MED ORDER — ONDANSETRON HCL 4 MG/2ML IJ SOLN
4.0000 mg | Freq: Four times a day (QID) | INTRAMUSCULAR | Status: DC | PRN
  Filled 2013-10-18: qty 2

## 2013-10-18 MED ORDER — CYCLOBENZAPRINE HCL 10 MG PO TABS
ORAL_TABLET | ORAL | Status: AC
  Filled 2013-10-18: qty 1

## 2013-10-18 MED ORDER — METOCLOPRAMIDE HCL 5 MG/ML IJ SOLN
INTRAMUSCULAR | Status: DC | PRN
  Administered 2013-10-18: 5 mg via INTRAVENOUS

## 2013-10-18 MED ORDER — LIDOCAINE HCL (CARDIAC) 20 MG/ML IV SOLN
INTRAVENOUS | Status: DC | PRN
  Administered 2013-10-18: 20 mg via INTRAVENOUS
  Administered 2013-10-18: 30 mg via INTRAVENOUS

## 2013-10-18 MED ORDER — PHENOL 1.4 % MT LIQD
1.0000 | OROMUCOSAL | Status: DC | PRN
Start: 2013-10-18 — End: 2013-10-20

## 2013-10-18 MED ORDER — PROPOFOL 10 MG/ML IV BOLUS
INTRAVENOUS | Status: DC | PRN
  Administered 2013-10-18: 150 mg via INTRAVENOUS

## 2013-10-18 MED ORDER — MENTHOL 3 MG MT LOZG: 1.0000 | LOZENGE | OROMUCOSAL | Status: DC | PRN

## 2013-10-18 MED ORDER — PHENYLEPHRINE HCL 10 MG/ML IJ SOLN
10.0000 mg | INTRAMUSCULAR | Status: DC | PRN
  Administered 2013-10-18: 20 ug/min via INTRAVENOUS

## 2013-10-18 MED ORDER — FENTANYL CITRATE 0.05 MG/ML IJ SOLN
INTRAMUSCULAR | Status: DC | PRN
  Administered 2013-10-18 (×3): 50 ug via INTRAVENOUS
  Administered 2013-10-18: 250 ug via INTRAVENOUS

## 2013-10-18 SURGICAL SUPPLY — 71 items
ALLOGRAFT 7X14X11 (Bone Implant) ×6 IMPLANT
BAG DECANTER FOR FLEXI CONT (MISCELLANEOUS) ×2 IMPLANT
BIT DRILL NEURO 2X3.1 SFT TUCH (MISCELLANEOUS) ×1 IMPLANT
BLADE ULTRA TIP 2M (BLADE) ×2 IMPLANT
BRUSH SCRUB EZ PLAIN DRY (MISCELLANEOUS) ×2 IMPLANT
CANISTER SUCT 3000ML (MISCELLANEOUS) ×2 IMPLANT
CONT SPEC 4OZ CLIKSEAL STRL BL (MISCELLANEOUS) ×2 IMPLANT
COVER MAYO STAND STRL (DRAPES) ×2 IMPLANT
DECANTER SPIKE VIAL GLASS SM (MISCELLANEOUS) IMPLANT
DERMABOND ADHESIVE PROPEN (GAUZE/BANDAGES/DRESSINGS) ×1
DERMABOND ADVANCED (GAUZE/BANDAGES/DRESSINGS)
DERMABOND ADVANCED .7 DNX12 (GAUZE/BANDAGES/DRESSINGS) IMPLANT
DERMABOND ADVANCED .7 DNX6 (GAUZE/BANDAGES/DRESSINGS) ×1 IMPLANT
DRAPE LAPAROTOMY 100X72 PEDS (DRAPES) ×2 IMPLANT
DRAPE MICROSCOPE LEICA (MISCELLANEOUS) ×2 IMPLANT
DRAPE POUCH INSTRU U-SHP 10X18 (DRAPES) ×2 IMPLANT
DRAPE PROXIMA HALF (DRAPES) IMPLANT
DRILL NEURO 2X3.1 SOFT TOUCH (MISCELLANEOUS) ×2
ELECT COATED BLADE 2.86 ST (ELECTRODE) ×2 IMPLANT
ELECT REM PT RETURN 9FT ADLT (ELECTROSURGICAL) ×2
ELECTRODE REM PT RTRN 9FT ADLT (ELECTROSURGICAL) ×1 IMPLANT
EVACUATOR 1/8 PVC DRAIN (DRAIN) ×2 IMPLANT
GAUZE SPONGE 2X2 8PLY STRL LF (GAUZE/BANDAGES/DRESSINGS) ×1 IMPLANT
GLOVE BIOGEL PI IND STRL 7.0 (GLOVE) ×3 IMPLANT
GLOVE BIOGEL PI IND STRL 7.5 (GLOVE) ×1 IMPLANT
GLOVE BIOGEL PI IND STRL 8 (GLOVE) ×1 IMPLANT
GLOVE BIOGEL PI INDICATOR 7.0 (GLOVE) ×3
GLOVE BIOGEL PI INDICATOR 7.5 (GLOVE) ×1
GLOVE BIOGEL PI INDICATOR 8 (GLOVE) ×1
GLOVE ECLIPSE 7.5 STRL STRAW (GLOVE) ×10 IMPLANT
GLOVE EXAM NITRILE LRG STRL (GLOVE) IMPLANT
GLOVE EXAM NITRILE MD LF STRL (GLOVE) IMPLANT
GLOVE EXAM NITRILE XL STR (GLOVE) IMPLANT
GLOVE EXAM NITRILE XS STR PU (GLOVE) IMPLANT
GLOVE SS BIOGEL STRL SZ 6.5 (GLOVE) ×2 IMPLANT
GLOVE SUPERSENSE BIOGEL SZ 6.5 (GLOVE) ×2
GOWN BRE IMP SLV AUR LG STRL (GOWN DISPOSABLE) IMPLANT
GOWN BRE IMP SLV AUR XL STRL (GOWN DISPOSABLE) IMPLANT
GOWN STRL REIN 2XL LVL4 (GOWN DISPOSABLE) IMPLANT
GOWN STRL REUS W/ TWL LRG LVL3 (GOWN DISPOSABLE) ×2 IMPLANT
GOWN STRL REUS W/ TWL XL LVL3 (GOWN DISPOSABLE) ×2 IMPLANT
GOWN STRL REUS W/TWL LRG LVL3 (GOWN DISPOSABLE) ×2
GOWN STRL REUS W/TWL XL LVL3 (GOWN DISPOSABLE) ×2
HEAD HALTER (SOFTGOODS) ×2 IMPLANT
KIT BASIN OR (CUSTOM PROCEDURE TRAY) ×2 IMPLANT
KIT ROOM TURNOVER OR (KITS) ×2 IMPLANT
NEEDLE HYPO 25X1 1.5 SAFETY (NEEDLE) ×2 IMPLANT
NEEDLE SPNL 22GX3.5 QUINCKE BK (NEEDLE) ×4 IMPLANT
NS IRRIG 1000ML POUR BTL (IV SOLUTION) ×2 IMPLANT
PACK LAMINECTOMY NEURO (CUSTOM PROCEDURE TRAY) ×2 IMPLANT
PAD ARMBOARD 7.5X6 YLW CONV (MISCELLANEOUS) ×4 IMPLANT
PATTIES SURGICAL 1X1 (DISPOSABLE) ×2 IMPLANT
PLATE CERVICAL 12MM (Plate) ×2 IMPLANT
PLATE CERVICAL 41MM (Plate) ×2 IMPLANT
RUBBERBAND STERILE (MISCELLANEOUS) ×4 IMPLANT
SCREW FIX 4.0X13MM (Screw) ×2 IMPLANT
SCREW VAR 4.0X13MM (Screw) ×16 IMPLANT
SPONGE GAUZE 2X2 STER 10/PKG (GAUZE/BANDAGES/DRESSINGS) ×1
SPONGE INTESTINAL PEANUT (DISPOSABLE) ×4 IMPLANT
SPONGE SURGIFOAM ABS GEL 100 (HEMOSTASIS) ×2 IMPLANT
STAPLER SKIN PROX WIDE 3.9 (STAPLE) ×2 IMPLANT
SUT VIC AB 0 CT1 18XCR BRD8 (SUTURE) IMPLANT
SUT VIC AB 0 CT1 8-18 (SUTURE)
SUT VIC AB 2-0 CP2 18 (SUTURE) ×2 IMPLANT
SUT VIC AB 3-0 SH 8-18 (SUTURE) ×6 IMPLANT
SYR 20ML ECCENTRIC (SYRINGE) ×2 IMPLANT
TAPE CLOTH SURG 4X10 WHT LF (GAUZE/BANDAGES/DRESSINGS) ×2 IMPLANT
TOWEL OR 17X24 6PK STRL BLUE (TOWEL DISPOSABLE) ×2 IMPLANT
TOWEL OR 17X26 10 PK STRL BLUE (TOWEL DISPOSABLE) ×2 IMPLANT
TRAY FOLEY CATH 14FRSI W/METER (CATHETERS) ×2 IMPLANT
WATER STERILE IRR 1000ML POUR (IV SOLUTION) ×2 IMPLANT

## 2013-10-18 NOTE — Anesthesia Postprocedure Evaluation (Signed)
  Anesthesia Post-op Note  Patient: [name]  Procedure(s) Performed: Procedure(s) with comments: Cervical three-four, Cervical four-five, Cervical seven-Thoracic one anterior cervical decompression with fusion plating and bonegraft (N/A) - Cervical three-four, Cervical four-five, Cervical seven-Thoracic one anterior cervical decompression with fusion plating and bonegraft  Patient Location: PACU  Anesthesia Type:General  Level of Consciousness: awake, alert , oriented and patient cooperative  Airway and Oxygen Therapy: Patient Spontanous Breathing and Patient connected to nasal cannula oxygen  Post-op Pain: mild  Post-op Assessment: Post-op Vital signs reviewed, Patient's Cardiovascular Status Stable, Respiratory Function Stable, Patent Airway, No signs of Nausea or vomiting and Pain level controlled  Post-op Vital Signs: Reviewed and stable  Complications: No apparent anesthesia complications

## 2013-10-18 NOTE — Progress Notes (Signed)
Filed Vitals:   10/18/13 1751 10/18/13 1800 10/18/13 1815 10/18/13 1841  BP:  159/71 158/72 162/74  Pulse: 101 99 98 103  Temp:  97.6 F (36.4 C)  98.5 F (36.9 C)  TempSrc:      Resp: 17 17 16 20   SpO2: 97% 97% 94% 96%    Patient resting in bed, eating dinner. Wound clean and dry. Minimal drainage into Hemovac drain. Moving all 4 extremities well.  Plan: We'll begin out of bed this evening, DC Foley, add sliding-scale insulin coverage.  Hosie Spangle, MD 10/18/2013, 7:05 PM

## 2013-10-18 NOTE — Anesthesia Preprocedure Evaluation (Signed)
Anesthesia Evaluation  Patient identified by MRN, date of birth, ID band Patient awake    Reviewed: Allergy & Precautions, H&P , NPO status , Patient's Chart, lab work & pertinent test results, reviewed documented beta blocker date and time   History of Anesthesia Complications (+) PONV and history of anesthetic complications  Airway Mallampati: II TM Distance: >3 FB Neck ROM: Full    Dental  (+) Teeth Intact and Dental Advisory Given   Pulmonary asthma (no inhaler in a year or so) , former smoker (quit a year ago),  breath sounds clear to auscultation  Pulmonary exam normal       Cardiovascular hypertension, Pt. on medications Rhythm:Regular Rate:Normal     Neuro/Psych Chronic neck pain    GI/Hepatic Neg liver ROS, GERD-  Medicated and Controlled,  Endo/Other  diabetes (glu 173), Insulin Dependent and Oral Hypoglycemic AgentsHypothyroidism   Renal/GU negative Renal ROS     Musculoskeletal   Abdominal   Peds  Hematology   Anesthesia Other Findings   Reproductive/Obstetrics                           Anesthesia Physical Anesthesia Plan  ASA: III  Anesthesia Plan: General   Post-op Pain Management:    Induction: Intravenous  Airway Management Planned: Oral ETT  Additional Equipment:   Intra-op Plan:   Post-operative Plan: Extubation in OR  Informed Consent: I have reviewed the patients History and Physical, chart, labs and discussed the procedure including the risks, benefits and alternatives for the proposed anesthesia with the patient or authorized representative who has indicated his/her understanding and acceptance.   Dental advisory given  Plan Discussed with: CRNA and Surgeon  Anesthesia Plan Comments: (Plan routine monitors, GETA )        Anesthesia Quick Evaluation

## 2013-10-18 NOTE — Op Note (Signed)
10/18/2013  3:32 PM  PATIENT:  [name]  63 y.o. female  PRE-OPERATIVE DIAGNOSIS:  C3-4, C4-5, C7-T1 cervical herniated disc, cervical spondylosis, cervical degenerative disc disease, cervical radiculopathy, cervicalgia  POST-OPERATIVE DIAGNOSIS:  C3-4, C4-5, C7-T1 cervical herniated disc, cervical spondylosis, cervical degenerative disc disease, cervical radiculopathy, cervicalgia  PROCEDURE:  Procedure(s): Cervical three-four, Cervical four-five, Cervical seven-Thoracic one anterior cervical decompression with fusion plating and bonegraft:  Explantation of C5-6 anterior cervical plate; C3-4 and C4-5 anterior cervical decompression and arthrodesis with allograft and tether cervical plating; C7-T1 anterior cervical decompression arthrodesis with allograft and tether cervical plating  SURGEON:  Surgeon(s): Hosie Spangle, MD Otilio Connors, MD  ASSISTANTS: Hazle Coca, M.D.  ANESTHESIA:   general  EBL:  Total I/O In: 2000 [I.V.:2000] Out: 510 [Urine:360; Blood:150]  BLOOD ADMINISTERED:none  COUNT: Correct per nursing staff  DRAINS: Prevertebral medium Hemovac drain   DICTATION: Patient was brought to the operating room placed under general endotracheal anesthesia. Patient was placed in 10 pounds of halter traction. The neck was prepped with Betadine soap and solution and draped in a sterile fashion. An oblique incision was made on the left side of the neck. The line of the incision was infiltrated with local anesthetic with epinephrine. Dissection was carried down thru the subcutaneous tissue and platysma, bipolar cautery was used to maintain hemostasis. Dissection was then carried out thru an avascular plane leaving the sternocleidomastoid carotid artery and jugular vein laterally and the trachea and esophagus medially. The ventral aspect of the vertebral column was identified, and we identified the existing C5-6 anterior cervical plate and Z3-0 anterior cervical plate. A  localizing x-ray was taken. The C3-4, C4-5, and C7-T1 levels were identified. We then proceeded with removal of the existing C5-6 anterior cervical tether plate. Each of the 4 screws was unlocked, removed, and then the anterior cervical plate was removed. Bleeding from the screw holes was controlled with Gelfoam with thrombin, and bleeding from the bony surface was controlled with Gelfoam with thrombin and Surgifoam. We then proceeded with the decompression and arthrodesis at each level. The annulus at each level was incised and the disc space entered. Discectomy was performed with micro-curettes and pituitary rongeurs. The operating microscope was draped and brought into the field provided additional magnification illumination and visualization. Discectomy was continued posteriorly thru the disc space and then the cartilaginous endplate was removed using micro-curettes along with the high-speed drill. Posterior osteophytic overgrowth was removed each level using the high-speed drill along with a 2 mm thin footplated Kerrison punch. Posterior longitudinal ligament along with disc herniation was carefully removed, decompressing the spinal canal and thecal sac. We then continued to remove osteophytic overgrowth and disc material decompressing the neural foramina and exiting nerve roots bilaterally. Once the decompression was completed hemostasis was established at each level with the use of Gelfoam with thrombin, Surgifoam, and bipolar cautery. The Gelfoam was removed, the wound irrigated, Surgifoam was placed in the epidural space, and hemostasis confirmed. We then measured the height of the intravertebral disc space level at the C3-4 and C4-5 levels, and selected a 7 millimeter in height structural allograft for the C3-4 level and a 7 millimeter in height structural allograft for the C4-5 level . Each was hydrated and saline solution and then gently positioned in the intravertebral disc space and countersunk. We  then selected a 41 millimeter in height Tether cervical plate. It was positioned over the fusion construct and secured to the vertebra with a pair of 4  x 13 mm variable screws at the C5 level, using the existing pair screw holes, a single 4 x 13 mm fixed screw at the C4 level, and a pair of 4 x 13 mm variable screws at the C3 level. Each of the screw holes at C3 and C4 was started with the high-speed drill and then the screws placed, once all the screws were placed final tightening was performed. We then measured the height of the intravertebral disc space at the C7-T1 level and selected a 7 mm millimeter in height structural allograft. It was hydrated and saline solution and then gently positioned in the C7-T1 intravertebral disc space and countersunk. We then selected a 12 millimeter in height Tether cervical plate. It was positioned over the fusion construct and secured to the vertebra with 4 x 13 mm fixed screws at the C7 level, and 4 x 13 mm variable screws at the T1 level. Each screw hole was started with the high-speed drill and then the screws placed once all the screws were placed final tightening was performed.  The wound was irrigated with bacitracin solution checked for hemostasis which was established and confirmed. An x-ray was taken which showed the grafts in good position, the plate and screws in good position, and the overall construct looked good. We then placed a medium Hemovac drain in the prevertebral space, and was brought out through a separate stab incision, and sutured to the skin with 3-0 nylon suture. We then proceeded with closure. The platysma was closed with interrupted inverted 2-0 undyed Vicryl suture, the subcutaneous and subcuticular closed with interrupted inverted 3-0 undyed Vicryl suture. The skin edges were approximated with Dermabond. A dry dressing was applied to the drain exit site. Following surgery the patient was taken out of cervical traction. To be reversed and the  anesthetic and taken to the recovery room for further care.  PLAN OF CARE: Admit to inpatient   PATIENT DISPOSITION:  PACU - hemodynamically stable.   Delay start of Pharmacological VTE agent (>24hrs) due to surgical blood loss or risk of bleeding:  yes

## 2013-10-18 NOTE — Preoperative (Signed)
Beta Blockers   Reason not to administer Beta Blockers:Not Applicable 

## 2013-10-18 NOTE — Transfer of Care (Signed)
Immediate Anesthesia Transfer of Care Note  Patient: [name]  Procedure(s) Performed: Procedure(s) with comments: Cervical three-four, Cervical four-five, Cervical seven-Thoracic one anterior cervical decompression with fusion plating and bonegraft (N/A) - Cervical three-four, Cervical four-five, Cervical seven-Thoracic one anterior cervical decompression with fusion plating and bonegraft  Patient Location: PACU  Anesthesia Type:General  Level of Consciousness: patient cooperative and responds to stimulation  Airway & Oxygen Therapy: Patient Spontanous Breathing and Patient connected to nasal cannula oxygen  Post-op Assessment: Report given to PACU RN and Post -op Vital signs reviewed and stable  Post vital signs: Reviewed and stable  Complications: No apparent anesthesia complications

## 2013-10-18 NOTE — Progress Notes (Signed)
Filed Vitals:   10/18/13 1014  BP: 164/78  Pulse: 95  Temp: 97.7 F (36.5 C)  TempSrc: Oral  Resp: 18  SpO2: 96%    Patient in recovery room, drowsy, but following commands. Moving all 4 extremities well. Wound clean and dry.  Plan: PACU care, then transferred to floor to progress through postoperative recovery.  Hosie Spangle, MD 10/18/2013, 3:59 PM

## 2013-10-18 NOTE — H&P (Signed)
Subjective: Patient is a 63 y.o. female who is admitted for treatment of multilevel cervical disc herniations, spondylosis, and degenerative disc disease with resulting neck pain and bilateral cervical radicular pain, numbness, and tingling. Patient had a C5-6 ACDF in 2002, and a C6-7 ACDF in 2009; both fusions are well-healed. MRI scan shows disc herniations at the C3-4, C4-5, and C7-T1 levels with associated neural compression. Patient is now for C3-4 and C4-5 ACDF and C7-T1 ACDF.   Patient Active Problem List   Diagnosis Date Noted  . GERD (gastroesophageal reflux disease) 10/15/2012  . Abdominal pain, chronic, right upper quadrant 10/15/2012  . Hyperparathyroidism, primary 06/10/2012   Past Medical History  Diagnosis Date  . Generalized headaches   . Weakness   . Herniated disc, cervical   . Hypertension     takes Enalapril dialy  . GERD (gastroesophageal reflux disease)     takes Omeprazole daily  . Hypothyroidism     takes Synthroid daily  . PONV (postoperative nausea and vomiting)   . Hyperlipidemia     takes Welchol daily  . Asthma     takes Singulair nightly  . TIA (transient ischemic attack)     diagnosed by EEG but never knew she had it  . Trigger finger   . Weakness     in both hands  . Arthritis   . Joint pain     hands and neck  . Seizures   . IBS (irritable bowel syndrome)   . History of kidney stones   . Nocturia   . Diabetes mellitus     takes levemir daily and Metformin daily    Past Surgical History  Procedure Laterality Date  . Abdominal hysterectomy  1980  . Carpel tunnel Bilateral     left first then right  . Herniated disc   10/2000, 08/31/2008    C5-C6 then C6-C7  . Lithotripsy  11/12/2007    right side  . Eye surgery  11/18/2008    right - cataracts  . Eye surgery  06/01/2009    left - cataracts  . Tonsillectomy      as a child  . Appendectomy      done with hysterectomy  . Parathyroidectomy  07/02/2012    Procedure: PARATHYROIDECTOMY;   Surgeon: Velora Heckler, MD;  Location: [facility];  Service: General;  Laterality: N/A;  . Parathyroid surgery      October 2013  . Knee anthroscopy bilateral Bilateral   . Cataract implants    . Back surgery  2007    lumbar fusion  . Esophagogastroduodenoscopy      hx of h pylori  . Colonoscopy      Prescriptions prior to admission  Medication Sig Dispense Refill  . colesevelam (WELCHOL) 625 MG tablet Take 625 mg by mouth daily.       . enalapril (VASOTEC) 10 MG tablet Take 10 mg by mouth daily.      . insulin detemir (LEVEMIR) 100 UNIT/ML injection Inject 20 Units into the skin at bedtime.      Marland Kitchen levothyroxine (SYNTHROID, LEVOTHROID) 50 MCG tablet Take 50 mcg by mouth daily before breakfast.      . metFORMIN (GLUCOPHAGE) 500 MG tablet Take 500 mg by mouth 2 (two) times daily with a meal.      . montelukast (SINGULAIR) 10 MG tablet Take 10 mg by mouth at bedtime.       Marland Kitchen omeprazole (PRILOSEC) 20 MG capsule Take 20 mg by mouth daily.  Allergies  Allergen Reactions  . Advair Diskus [Fluticasone-Salmeterol] Anaphylaxis  . Codeine Nausea And Vomiting  . Hydrocodone-Acetaminophen Nausea And Vomiting  . Oxycodone-Acetaminophen Nausea And Vomiting  . Tetracyclines & Related Itching    Internal    History  Substance Use Topics  . Smoking status: Current Every Day Smoker -- 1.00 packs/day for 40 years  . Smokeless tobacco: Never Used     Comment: quit smoking in Oct 2013  . Alcohol Use: No    Family History  Problem Relation Age of Onset  . Cancer Mother     pancreatic     Review of Systems A comprehensive review of systems was negative.  Objective: Vital signs in last 24 hours: Temp:  [97.7 F (36.5 C)] 97.7 F (36.5 C) (01/19 1014) Pulse Rate:  [95] 95 (01/19 1014) Resp:  [18] 18 (01/19 1014) BP: (164)/(78) 164/78 mmHg (01/19 1014) SpO2:  [96 %] 96 % (01/19 1014)  EXAM: Patient well-developed well-nourished white female in no acute distress. Lungs are clear to  auscultation , the patient has symmetrical respiratory excursion. Heart has a regular rate and rhythm normal S1 and S2 no murmur.   Abdomen is soft nontender nondistended bowel sounds are present. Extremity examination shows no clubbing cyanosis or edema. Musculoskeletal examination shows no tenderness to palpation over the cervical spinous processes or paracervical musculature. She has a full range of motion neck, through flexion, extension, and lateral flexion to either side. Motor examination shows 5 over 5 strength in the upper extremities including the deltoid biceps triceps and intrinsics and grip. Sensation is intact to pinprick throughout the digits of the upper extremities. Reflexes are symmetrical and without evidence of pathologic reflexes. Patient has a normal gait and stance.   Data Review:CBC    Component Value Date/Time   WBC 8.6 10/13/2013 1427   RBC 5.20* 10/13/2013 1427   HGB 15.1* 10/13/2013 1427   HCT 44.2 10/13/2013 1427   PLT 270 10/13/2013 1427   MCV 85.0 10/13/2013 1427   MCH 29.0 10/13/2013 1427   MCHC 34.2 10/13/2013 1427   RDW 14.3 10/13/2013 1427                          BMET    Component Value Date/Time   NA 139 10/13/2013 1427   K 4.5 10/13/2013 1427   CL 99 10/13/2013 1427   CO2 27 10/13/2013 1427   GLUCOSE 164* 10/13/2013 1427   BUN 16 10/13/2013 1427   CREATININE 0.73 10/13/2013 1427   CALCIUM 9.6 10/13/2013 1427   GFRNONAA 88* 10/13/2013 1427   GFRAA >90 10/13/2013 1427     Assessment/Plan: Patient with multilevel cervical spondylosis and degenerative disease, with superimposed cervical disc herniations. Symptomatically he is having neck pain and bilateral cervical radicular pain, numbness, or paresthesias. She is admitted now for a C3-4 and C4-5 ACDF, and C7-T1 ACDF.  I've discussed with the patient the nature of his condition, the nature the surgical procedure, the typical length of surgery, hospital stay, and overall recuperation. We discussed limitations  postoperatively. I discussed risks of surgery including risks of infection, bleeding, possibly need for transfusion, the risk of nerve root dysfunction with pain, weakness, numbness, or paresthesias, the risk of spinal cord dysfunction with paralysis of all 4 limbs and quadriplegia, and the risk of dural tear and CSF leakage and possible need for further surgery, the risk of esophageal dysfunction causing dysphagia and the risk of laryngeal dysfunction causing hoarseness  of the voice, the risk of failure of the arthrodesis and the possible need for further surgery, and the risk of anesthetic complications including myocardial infarction, stroke, pneumonia, and death. We also discussed the need for postoperative immobilization in a cervical collar. Understanding all this the patient does wish to proceed with surgery and is admitted for such.    Hosie Spangle, MD 10/18/2013 11:24 AM

## 2013-10-19 ENCOUNTER — Encounter (HOSPITAL_COMMUNITY): Payer: Self-pay | Admitting: Neurosurgery

## 2013-10-19 LAB — GLUCOSE, CAPILLARY
Glucose-Capillary: 179 mg/dL — ABNORMAL HIGH (ref 70–99)
Glucose-Capillary: 166 mg/dL — ABNORMAL HIGH (ref 70–99)
Glucose-Capillary: 174 mg/dL — ABNORMAL HIGH (ref 70–99)
Glucose-Capillary: 163 mg/dL — ABNORMAL HIGH (ref 70–99)

## 2013-10-19 NOTE — Progress Notes (Signed)
Utilization review completed.  

## 2013-10-19 NOTE — Progress Notes (Signed)
Filed Vitals:   10/18/13 1932 10/18/13 2324 10/19/13 0312 10/19/13 0753  BP: 174/74 152/77 152/76 152/74  Pulse: 108 98 100 104  Temp: 97.4 F (36.3 C) 97.8 F (36.6 C) 98.8 F (37.1 C) 98.5 F (36.9 C)  TempSrc: Oral Oral Oral   Resp: 18 18 20 20   SpO2: 93% 94% 94% 94%    Patient up and ambulating in halls. Wound clean and dry. Small amount of drainage into Hemovac, we'll DC in a.m. a.m. Accu-Chek 179. Voiding well following Foley being DC'd.  Plan: Continue to progress through postoperative recovery. Encouraged ambulation.  Hosie Spangle, MD 10/19/2013, 8:19 AM

## 2013-10-20 LAB — GLUCOSE, CAPILLARY: Glucose-Capillary: 156 mg/dL — ABNORMAL HIGH (ref 70–99)

## 2013-10-20 NOTE — Progress Notes (Signed)
Pt doing very well. Pt and husband given D/C instructions with verbal understanding. Pt D/C'd home via wheelchair @ 1115 per MD order. Pt stable @ D/C and has no other needs at this time. Holli Humbles, RN

## 2013-10-20 NOTE — Discharge Summary (Signed)
Physician Discharge Summary  Patient ID: [name] MRN: [id number] DOB/AGE: 03-05-1951 63 y.o.  Admit date: 10/18/2013 Discharge date: 10/20/2013  Admission Diagnoses:  C3-4, C4-5, C7-T1 cervical herniated disc, cervical spondylosis, cervical degenerative disc disease, cervical radiculopathy, cervicalgia  Discharge Diagnoses:  C3-4, C4-5, C7-T1 cervical herniated disc, cervical spondylosis, cervical degenerative disc disease, cervical radiculopathy, cervicalgia  Active Problems:   HNP (herniated nucleus pulposus), cervical  Discharged Condition: good  Hospital Course: Patient was admitted, underwent a C3-4, C4-5, and C7-T1 ACDF. We did remove existing plate at the H6-3 level. Postoperatively she is making steady progress. She was able to void after the Foley was removed. She is up and ambulating in the halls. Her wound is healing nicely. She did have a Hemovac drain in place, it was removed earlier on the day of admission. She is being discharged home with instructions regarding wound care and activities. He is to return for followup with me in about 3 weeks.  Discharge Exam: Blood pressure 136/72, pulse 87, temperature 98 F (36.7 C), temperature source Oral, resp. rate 18, SpO2 92.00%.  Disposition: Home     Medication List         colesevelam 625 MG tablet  Commonly known as:  WELCHOL  Take 625 mg by mouth daily.     enalapril 10 MG tablet  Commonly known as:  VASOTEC  Take 10 mg by mouth daily.     insulin detemir 100 UNIT/ML injection  Commonly known as:  LEVEMIR  Inject 20 Units into the skin at bedtime.     levothyroxine 50 MCG tablet  Commonly known as:  SYNTHROID, LEVOTHROID  Take 50 mcg by mouth daily before breakfast.     metFORMIN 500 MG tablet  Commonly known as:  GLUCOPHAGE  Take 500 mg by mouth 2 (two) times daily with a meal.     montelukast 10 MG tablet  Commonly known as:  SINGULAIR  Take 10 mg by mouth at bedtime.     omeprazole 20 MG  capsule  Commonly known as:  PRILOSEC  Take 20 mg by mouth daily.         Signed: Hosie Spangle, MD 10/20/2013, 10:50 AM

## 2014-06-29 ENCOUNTER — Other Ambulatory Visit (INDEPENDENT_AMBULATORY_CARE_PROVIDER_SITE_OTHER): Payer: Self-pay | Admitting: Internal Medicine

## 2014-07-12 ENCOUNTER — Emergency Department (HOSPITAL_COMMUNITY)
Admission: EM | Admit: 2014-07-12 | Discharge: 2014-07-12 | Disposition: A | Discharge status: Home / Self Care | Payer: BC Managed Care – PPO | Attending: Emergency Medicine | Admitting: Emergency Medicine

## 2014-07-12 ENCOUNTER — Encounter (HOSPITAL_COMMUNITY): Payer: Self-pay | Admitting: Emergency Medicine

## 2014-07-12 ENCOUNTER — Emergency Department (HOSPITAL_COMMUNITY): Payer: BC Managed Care – PPO

## 2014-07-12 DIAGNOSIS — E039 Hypothyroidism, unspecified: Secondary | ICD-10-CM | POA: Diagnosis not present

## 2014-07-12 DIAGNOSIS — E785 Hyperlipidemia, unspecified: Secondary | ICD-10-CM | POA: Insufficient documentation

## 2014-07-12 DIAGNOSIS — S61011A Laceration without foreign body of right thumb without damage to nail, initial encounter: Secondary | ICD-10-CM | POA: Diagnosis not present

## 2014-07-12 DIAGNOSIS — Z794 Long term (current) use of insulin: Secondary | ICD-10-CM | POA: Insufficient documentation

## 2014-07-12 DIAGNOSIS — Z8673 Personal history of transient ischemic attack (TIA), and cerebral infarction without residual deficits: Secondary | ICD-10-CM | POA: Insufficient documentation

## 2014-07-12 DIAGNOSIS — Y9389 Activity, other specified: Secondary | ICD-10-CM | POA: Insufficient documentation

## 2014-07-12 DIAGNOSIS — E119 Type 2 diabetes mellitus without complications: Secondary | ICD-10-CM | POA: Diagnosis not present

## 2014-07-12 DIAGNOSIS — Z23 Encounter for immunization: Secondary | ICD-10-CM | POA: Diagnosis not present

## 2014-07-12 DIAGNOSIS — Y9289 Other specified places as the place of occurrence of the external cause: Secondary | ICD-10-CM | POA: Diagnosis not present

## 2014-07-12 DIAGNOSIS — Z87442 Personal history of urinary calculi: Secondary | ICD-10-CM | POA: Diagnosis not present

## 2014-07-12 DIAGNOSIS — J45909 Unspecified asthma, uncomplicated: Secondary | ICD-10-CM | POA: Insufficient documentation

## 2014-07-12 DIAGNOSIS — Z8739 Personal history of other diseases of the musculoskeletal system and connective tissue: Secondary | ICD-10-CM | POA: Diagnosis not present

## 2014-07-12 DIAGNOSIS — I10 Essential (primary) hypertension: Secondary | ICD-10-CM | POA: Diagnosis not present

## 2014-07-12 DIAGNOSIS — Y288XXA Contact with other sharp object, undetermined intent, initial encounter: Secondary | ICD-10-CM | POA: Diagnosis not present

## 2014-07-12 DIAGNOSIS — Z8669 Personal history of other diseases of the nervous system and sense organs: Secondary | ICD-10-CM | POA: Diagnosis not present

## 2014-07-12 DIAGNOSIS — S61209A Unspecified open wound of unspecified finger without damage to nail, initial encounter: Secondary | ICD-10-CM

## 2014-07-12 DIAGNOSIS — Z79899 Other long term (current) drug therapy: Secondary | ICD-10-CM | POA: Diagnosis not present

## 2014-07-12 DIAGNOSIS — K219 Gastro-esophageal reflux disease without esophagitis: Secondary | ICD-10-CM | POA: Insufficient documentation

## 2014-07-12 MED ORDER — TRAMADOL HCL 50 MG PO TABS
50.0000 mg | ORAL_TABLET | Freq: Once | ORAL | Status: AC
Start: 2014-07-12 — End: 2014-07-12
  Administered 2014-07-12: 50 mg via ORAL
  Filled 2014-07-12: qty 1

## 2014-07-12 MED ORDER — CEPHALEXIN 500 MG PO CAPS: 500.0000 mg | ORAL_CAPSULE | Freq: Three times a day (TID) | ORAL | Status: DC

## 2014-07-12 MED ORDER — TETANUS-DIPHTH-ACELL PERTUSSIS 5-2.5-18.5 LF-MCG/0.5 IM SUSP
0.5000 mL | Freq: Once | INTRAMUSCULAR | Status: AC
  Administered 2014-07-12: 0.5 mL via INTRAMUSCULAR
  Filled 2014-07-12: qty 0.5

## 2014-07-12 MED ORDER — TRAMADOL HCL 50 MG PO TABS: 50.0000 mg | ORAL_TABLET | Freq: Four times a day (QID) | ORAL | Status: DC | PRN

## 2014-07-12 NOTE — ED Provider Notes (Signed)
CSN: [id number]     Arrival date & time 07/12/14  1512 History   First MD Initiated Contact with Patient 07/12/14 1610     Chief Complaint  Patient presents with  . Laceration     (Consider location/radiation/quality/duration/timing/severity/associated sxs/prior Treatment) HPI  [name] is a 63 y.o. female who presents to the Emergency Department complaining of laceration involving the tip of the right thumb.  She states she slicing potatoes with a meat slicer and cut the tip of her right thumb off.  She states she controlled the bleeding with direct pressure.  She denies swelling, numbness or difficulty moving the digit.  She states her last Td was approximately 5 years ago.  She nothing makes the pain better or worse.     Past Medical History  Diagnosis Date  . Generalized headaches   . Weakness   . Herniated disc, cervical   . Hypertension     takes Enalapril dialy  . GERD (gastroesophageal reflux disease)     takes Omeprazole daily  . Hypothyroidism     takes Synthroid daily  . PONV (postoperative nausea and vomiting)   . Hyperlipidemia     takes Welchol daily  . Asthma     takes Singulair nightly  . TIA (transient ischemic attack)     diagnosed by EEG but never knew she had it  . Trigger finger   . Weakness     in both hands  . Arthritis   . Joint pain     hands and neck  . IBS (irritable bowel syndrome)   . History of kidney stones   . Nocturia   . Diabetes mellitus     takes levemir daily and Metformin daily  . Seizures     denies   Past Surgical History  Procedure Laterality Date  . Abdominal hysterectomy  1980  . Carpel tunnel Bilateral     left first then right  . Herniated disc   10/2000, 08/31/2008    C5-C6 then C6-C7  . Lithotripsy  11/12/2007    right side  . Eye surgery  11/18/2008    right - cataracts  . Eye surgery  06/01/2009    left - cataracts  . Tonsillectomy      as a child  . Appendectomy      done with hysterectomy  .  Parathyroidectomy  07/02/2012    Procedure: PARATHYROIDECTOMY;  Surgeon: Earnstine Regal, MD;  Location: [facility];  Service: General;  Laterality: N/A;  . Parathyroid surgery      October 2013  . Knee anthroscopy bilateral Bilateral   . Cataract implants    . Back surgery  2007    lumbar fusion  . Esophagogastroduodenoscopy      hx of h pylori  . Colonoscopy    . Anterior cervical decomp/discectomy fusion N/A 10/18/2013    Procedure: Cervical three-four, Cervical four-five, Cervical seven-Thoracic one anterior cervical decompression with fusion plating and bonegraft;  Surgeon: Hosie Spangle, MD;  Location: [place] [facility];  Service: Neurosurgery;  Laterality: N/A;  Cervical three-four, Cervical four-five, Cervical seven-Thoracic one anterior cervical decompression with fusion plating and bonegraft   Family History  Problem Relation Age of Onset  . Cancer Mother     pancreatic   History  Substance Use Topics  . Smoking status: Former Smoker -- 1.00 packs/day for 40 years  . Smokeless tobacco: Never Used     Comment: quit smoking in Oct 2013  . Alcohol Use:  No   OB History   Grav Para Term Preterm Abortions TAB SAB Ect Mult Living                 Review of Systems  Constitutional: Negative for fever and chills.  Musculoskeletal: Negative for arthralgias, back pain and joint swelling.  Skin: Positive for wound.       Laceration   Neurological: Negative for dizziness, weakness and numbness.  Hematological: Does not bruise/bleed easily.  All other systems reviewed and are negative.     Allergies  Advair diskus; Codeine; Hydrocodone-acetaminophen; Oxycodone-acetaminophen; and Tetracyclines & related  Home Medications   Prior to Admission medications   Medication Sig Start Date End Date Taking? Authorizing Provider  colesevelam (WELCHOL) 625 MG tablet Take 625 mg by mouth daily.     Historical Provider, MD  enalapril (VASOTEC) 10 MG tablet Take 10 mg by mouth daily.     Historical Provider, MD  insulin detemir (LEVEMIR) 100 UNIT/ML injection Inject 20 Units into the skin at bedtime.    Historical Provider, MD  levothyroxine (SYNTHROID, LEVOTHROID) 50 MCG tablet Take 50 mcg by mouth daily before breakfast.    Historical Provider, MD  metFORMIN (GLUCOPHAGE) 500 MG tablet Take 500 mg by mouth 2 (two) times daily with a meal.    Historical Provider, MD  montelukast (SINGULAIR) 10 MG tablet Take 10 mg by mouth at bedtime.  06/03/12   Historical Provider, MD  omeprazole (PRILOSEC) 20 MG capsule Take 20 mg by mouth daily.    Historical Provider, MD  omeprazole (PRILOSEC) 20 MG capsule TAKE ONE CAPSULE EVERY DAY 06/29/14   Rogene Houston, MD   BP 149/71  Pulse 107  Temp(Src) 98.3 F (36.8 C) (Oral)  Resp 20  Ht 5\' 6"  (1.676 m)  Wt 152 lb (68.947 kg)  BMI 24.55 kg/m2  SpO2 100% Physical Exam  Nursing note and vitals reviewed. Constitutional: She is oriented to person, place, and time. She appears well-developed and well-nourished. No distress.  HENT:  Head: Normocephalic and atraumatic.  Cardiovascular: Normal rate, regular rhythm, normal heart sounds and intact distal pulses.   No murmur heard. Pulmonary/Chest: Effort normal and breath sounds normal. No respiratory distress.  Musculoskeletal: She exhibits no edema and no tenderness.  Neurological: She is alert and oriented to person, place, and time. She exhibits normal muscle tone. Coordination normal.  Skin: Skin is warm. Laceration noted.  Avulsion of the skin to the lateral aspect of the distal tip of the right thumb.  No obvious exposed bone.  Pt has full ROM of the thumb, sensation intact.  Nail intact  Bleeding is controlled.      ED Course  Procedures (including critical care time) Labs Review Labs Reviewed - No data to display  Imaging Review Dg Finger Thumb Right  07/12/2014   CLINICAL DATA:  Laceration to the tip of the thumb.  EXAM: RIGHT THUMB 2+V  COMPARISON:  None.  FINDINGS: No osseous  abnormality. No radiodense foreign body in the soft tissues. Soft tissue defect noted at the tip of the thumb.  IMPRESSION: Soft tissue defect.  Otherwise normal.   Electronically Signed   By: Rozetta Nunnery M.D.   On: 07/12/2014 16:14     EKG Interpretation None      MDM   Final diagnoses:  Fingertip avulsion, initial encounter    Wound was cleaned and dressing applied,  Td updated.  [place] Aline Brochure agrees to xeroform dressing and abx.  Will  see in his office for f/u.  Bleeding is controlled.  No exposed bone.  Pt agrees to plan and close f/u.    Oswald Pott L. Alizay Bronkema, PA-C 07/14/14 1343

## 2014-07-12 NOTE — Discharge Instructions (Signed)
Fingertip Injuries and Amputations °Fingertip injuries are common and often get injured because they are last to escape when pulling your hand out of harm's way. You have amputated (cut off) part of your finger. How this turns out depends largely on how much was amputated. If just the tip is amputated, often the end of the finger will grow back and the finger may return to much the same as it was before the injury.  °If more of the finger is missing, your caregiver has done the best with the tissue remaining to allow you to keep as much finger as is possible. Your caregiver after checking your injury has tried to leave you with a painless fingertip that has durable, feeling skin. If possible, your caregiver has tried to maintain the finger's length and appearance and preserve its fingernail.  °Please read the instructions outlined below and refer to this sheet in the next few weeks. These instructions provide you with general information on caring for yourself. Your caregiver may also give you specific instructions. While your treatment has been done according to the most current medical practices available, unavoidable complications occasionally occur. If you have any problems or questions after discharge, please call your caregiver. °HOME CARE INSTRUCTIONS  °· You may resume normal diet and activities as directed or allowed. °· Keep your hand elevated above the level of your heart. This helps decrease pain and swelling. °· Keep ice packs (or a bag of ice wrapped in a towel) on the injured area for 15-20 minutes, 03-04 times per day, for the first two days. °· Change dressings if necessary or as directed. °· Clean the wound daily or as directed. °· Only take over-the-counter or prescription medicines for pain, discomfort, or fever as directed by your caregiver. °· Keep appointments as directed. °SEEK IMMEDIATE MEDICAL CARE IF: °· You develop redness, swelling, numbness or increasing pain in the wound. °· There is  pus coming from the wound. °· You develop an unexplained oral temperature above 102° F (38.9° C) or as your caregiver suggests. °· There is a foul (bad) smell coming from the wound or dressing. °· There is a breaking open of the wound (edges not staying together) after sutures or staples have been removed. °MAKE SURE YOU:  °· Understand these instructions. °· Will watch your condition. °· Will get help right away if you are not doing well or get worse. °Document Released: 08/07/2005 Document Revised: 12/09/2011 Document Reviewed: 07/06/2008 °[employer]® Patient Information ©2015 [employer]. This information is not intended to replace advice given to you by your health care provider. Make sure you discuss any questions you have with your health care provider. ° °

## 2014-07-12 NOTE — ED Notes (Signed)
Pt reports cut tip of r thumb on potato slicer.   Shaved lateral tip of thumb off.  Bleeding controlled with pressure and towel.

## 2014-07-15 NOTE — ED Provider Notes (Signed)
Medical screening examination/treatment/procedure(s) were performed by non-physician practitioner and as supervising physician I was immediately available for consultation/collaboration.   EKG Interpretation None       Altheia Shafran, MD 07/15/14 1408 

## 2014-10-11 DIAGNOSIS — E039 Hypothyroidism, unspecified: Secondary | ICD-10-CM | POA: Diagnosis not present

## 2014-10-11 DIAGNOSIS — I10 Essential (primary) hypertension: Secondary | ICD-10-CM | POA: Diagnosis not present

## 2014-10-11 DIAGNOSIS — E1165 Type 2 diabetes mellitus with hyperglycemia: Secondary | ICD-10-CM | POA: Diagnosis not present

## 2014-10-11 DIAGNOSIS — E559 Vitamin D deficiency, unspecified: Secondary | ICD-10-CM | POA: Diagnosis not present

## 2014-10-11 DIAGNOSIS — E782 Mixed hyperlipidemia: Secondary | ICD-10-CM | POA: Diagnosis not present

## 2014-11-02 DIAGNOSIS — L0100 Impetigo, unspecified: Secondary | ICD-10-CM | POA: Diagnosis not present

## 2014-11-02 DIAGNOSIS — L309 Dermatitis, unspecified: Secondary | ICD-10-CM | POA: Diagnosis not present

## 2014-11-02 DIAGNOSIS — L308 Other specified dermatitis: Secondary | ICD-10-CM | POA: Diagnosis not present

## 2014-11-02 DIAGNOSIS — D485 Neoplasm of uncertain behavior of skin: Secondary | ICD-10-CM | POA: Diagnosis not present

## 2014-11-11 DIAGNOSIS — I10 Essential (primary) hypertension: Secondary | ICD-10-CM | POA: Diagnosis not present

## 2014-11-11 DIAGNOSIS — Z981 Arthrodesis status: Secondary | ICD-10-CM | POA: Diagnosis not present

## 2014-11-11 DIAGNOSIS — M542 Cervicalgia: Secondary | ICD-10-CM | POA: Diagnosis not present

## 2014-12-07 DIAGNOSIS — L309 Dermatitis, unspecified: Secondary | ICD-10-CM | POA: Diagnosis not present

## 2015-01-11 DIAGNOSIS — E039 Hypothyroidism, unspecified: Secondary | ICD-10-CM | POA: Diagnosis not present

## 2015-01-11 DIAGNOSIS — E785 Hyperlipidemia, unspecified: Secondary | ICD-10-CM | POA: Diagnosis not present

## 2015-01-11 DIAGNOSIS — E1165 Type 2 diabetes mellitus with hyperglycemia: Secondary | ICD-10-CM | POA: Diagnosis not present

## 2015-04-04 ENCOUNTER — Telehealth (INDEPENDENT_AMBULATORY_CARE_PROVIDER_SITE_OTHER): Payer: Self-pay | Admitting: *Deleted

## 2015-04-04 NOTE — Telephone Encounter (Signed)
Voice mail needs to cancel Thursday appt.  [phone]

## 2015-04-06 ENCOUNTER — Ambulatory Visit (INDEPENDENT_AMBULATORY_CARE_PROVIDER_SITE_OTHER): Payer: Self-pay | Admitting: Internal Medicine

## 2015-04-12 DIAGNOSIS — I10 Essential (primary) hypertension: Secondary | ICD-10-CM | POA: Diagnosis not present

## 2015-04-12 DIAGNOSIS — E1165 Type 2 diabetes mellitus with hyperglycemia: Secondary | ICD-10-CM | POA: Diagnosis not present

## 2015-04-12 DIAGNOSIS — E785 Hyperlipidemia, unspecified: Secondary | ICD-10-CM | POA: Diagnosis not present

## 2015-04-12 DIAGNOSIS — E039 Hypothyroidism, unspecified: Secondary | ICD-10-CM | POA: Diagnosis not present

## 2015-04-27 DIAGNOSIS — E039 Hypothyroidism, unspecified: Secondary | ICD-10-CM | POA: Diagnosis not present

## 2015-04-27 DIAGNOSIS — E785 Hyperlipidemia, unspecified: Secondary | ICD-10-CM | POA: Diagnosis not present

## 2015-04-27 DIAGNOSIS — E1165 Type 2 diabetes mellitus with hyperglycemia: Secondary | ICD-10-CM | POA: Diagnosis not present

## 2015-04-27 DIAGNOSIS — I10 Essential (primary) hypertension: Secondary | ICD-10-CM | POA: Diagnosis not present

## 2015-06-07 DIAGNOSIS — Z0001 Encounter for general adult medical examination with abnormal findings: Secondary | ICD-10-CM | POA: Diagnosis not present

## 2015-06-07 DIAGNOSIS — F330 Major depressive disorder, recurrent, mild: Secondary | ICD-10-CM | POA: Diagnosis not present

## 2015-06-07 DIAGNOSIS — Z6824 Body mass index (BMI) 24.0-24.9, adult: Secondary | ICD-10-CM | POA: Diagnosis not present

## 2015-07-06 ENCOUNTER — Ambulatory Visit (INDEPENDENT_AMBULATORY_CARE_PROVIDER_SITE_OTHER): Payer: BLUE CROSS/BLUE SHIELD | Admitting: "Endocrinology

## 2015-07-06 ENCOUNTER — Encounter: Payer: Self-pay | Admitting: "Endocrinology

## 2015-07-06 VITALS — BP 162/67 | HR 86 | Ht 66.0 in | Wt 157.0 lb

## 2015-07-06 DIAGNOSIS — E1165 Type 2 diabetes mellitus with hyperglycemia: Secondary | ICD-10-CM

## 2015-07-06 DIAGNOSIS — E1169 Type 2 diabetes mellitus with other specified complication: Secondary | ICD-10-CM | POA: Insufficient documentation

## 2015-07-06 DIAGNOSIS — Z794 Long term (current) use of insulin: Secondary | ICD-10-CM

## 2015-07-06 DIAGNOSIS — I10 Essential (primary) hypertension: Secondary | ICD-10-CM | POA: Diagnosis not present

## 2015-07-06 DIAGNOSIS — E785 Hyperlipidemia, unspecified: Secondary | ICD-10-CM

## 2015-07-06 DIAGNOSIS — E119 Type 2 diabetes mellitus without complications: Secondary | ICD-10-CM | POA: Insufficient documentation

## 2015-07-06 DIAGNOSIS — E039 Hypothyroidism, unspecified: Secondary | ICD-10-CM | POA: Diagnosis not present

## 2015-07-06 DIAGNOSIS — E782 Mixed hyperlipidemia: Secondary | ICD-10-CM | POA: Insufficient documentation

## 2015-07-06 DIAGNOSIS — E210 Primary hyperparathyroidism: Secondary | ICD-10-CM | POA: Diagnosis not present

## 2015-07-06 DIAGNOSIS — IMO0001 Reserved for inherently not codable concepts without codable children: Secondary | ICD-10-CM

## 2015-07-06 MED ORDER — METFORMIN HCL 500 MG PO TABS: 1000.0000 mg | ORAL_TABLET | Freq: Two times a day (BID) | ORAL | Status: DC

## 2015-07-06 NOTE — Progress Notes (Signed)
Subjective:    Patient ID: [name], female    DOB: [birth year],    Past Medical History  Diagnosis Date  . Generalized headaches   . Weakness   . Herniated disc, cervical   . Hypertension     takes Enalapril dialy  . GERD (gastroesophageal reflux disease)     takes Omeprazole daily  . Hypothyroidism     takes Synthroid daily  . PONV (postoperative nausea and vomiting)   . Hyperlipidemia     takes Welchol daily  . Asthma     takes Singulair nightly  . TIA (transient ischemic attack)     diagnosed by EEG but never knew she had it  . Trigger finger   . Weakness     in both hands  . Arthritis   . Joint pain     hands and neck  . IBS (irritable bowel syndrome)   . History of kidney stones   . Nocturia   . Diabetes mellitus     takes levemir daily and Metformin daily  . Seizures ([place])     denies   Past Surgical History  Procedure Laterality Date  . Abdominal hysterectomy  1980  . Carpel tunnel Bilateral     left first then right  . Herniated disc   10/2000, 08/31/2008    C5-C6 then C6-C7  . Lithotripsy  11/12/2007    right side  . Eye surgery  11/18/2008    right - cataracts  . Eye surgery  06/01/2009    left - cataracts  . Tonsillectomy      as a child  . Appendectomy      done with hysterectomy  . Parathyroidectomy  07/02/2012    Procedure: PARATHYROIDECTOMY;  Surgeon: Earnstine Regal, MD;  Location: [facility];  Service: General;  Laterality: N/A;  . Parathyroid surgery      October 2013  . Knee anthroscopy bilateral Bilateral   . Cataract implants    . Back surgery  2007    lumbar fusion  . Esophagogastroduodenoscopy      hx of h pylori  . Colonoscopy    . Anterior cervical decomp/discectomy fusion N/A 10/18/2013    Procedure: Cervical three-four, Cervical four-five, Cervical seven-Thoracic one anterior cervical decompression with fusion plating and bonegraft;  Surgeon: Hosie Spangle, MD;  Location: [place] [facility];  Service: Neurosurgery;  Laterality:  N/A;  Cervical three-four, Cervical four-five, Cervical seven-Thoracic one anterior cervical decompression with fusion plating and bonegraft   Social History   Social History  . Marital Status: Married    Spouse Name: N/A  . Number of Children: N/A  . Years of Education: N/A   Social History Main Topics  . Smoking status: Former Smoker -- 1.00 packs/day for 40 years  . Smokeless tobacco: Never Used     Comment: quit smoking in Oct 2013  . Alcohol Use: No  . Drug Use: No  . Sexual Activity: Not Asked   Other Topics Concern  . None   Social History Narrative   Outpatient Encounter Prescriptions as of 07/06/2015  Medication Sig  . cephALEXin (KEFLEX) 500 MG capsule Take 1 capsule (500 mg total) by mouth 3 (three) times daily. For 10 days  . colesevelam (WELCHOL) 625 MG tablet Take 625 mg by mouth daily.   . enalapril (VASOTEC) 10 MG tablet Take 10 mg by mouth daily.  . insulin detemir (LEVEMIR) 100 UNIT/ML injection Inject 20 Units into the skin at bedtime.  Marland Kitchen  levothyroxine (SYNTHROID, LEVOTHROID) 50 MCG tablet Take 50 mcg by mouth daily before breakfast.  . metFORMIN (GLUCOPHAGE) 500 MG tablet Take 2 tablets (1,000 mg total) by mouth 2 (two) times daily with a meal.  . montelukast (SINGULAIR) 10 MG tablet Take 10 mg by mouth at bedtime.   Marland Kitchen omeprazole (PRILOSEC) 20 MG capsule Take 20 mg by mouth daily.  Marland Kitchen omeprazole (PRILOSEC) 20 MG capsule TAKE ONE CAPSULE EVERY DAY  . traMADol (ULTRAM) 50 MG tablet Take 1 tablet (50 mg total) by mouth every 6 (six) hours as needed.  . [DISCONTINUED] metFORMIN (GLUCOPHAGE) 500 MG tablet Take 500 mg by mouth 2 (two) times daily with a meal.   No facility-administered encounter medications on file as of 07/06/2015.   ALLERGIES: Allergies  Allergen Reactions  . Advair Diskus [Fluticasone-Salmeterol] Anaphylaxis  . Codeine Nausea And Vomiting  . Hydrocodone-Acetaminophen Nausea And Vomiting  . Oxycodone-Acetaminophen Nausea And Vomiting  .  Tetracyclines & Related Itching    Internal   VACCINATION STATUS: Immunization History  Administered Date(s) Administered  . Tdap 07/12/2014    Diabetes She presents for her follow-up diabetic visit. She has type 2 diabetes mellitus. Onset time: Diagnosed approximately at age  57 years. Pertinent negatives for hypoglycemia include no confusion, headaches, pallor or seizures. Associated symptoms include fatigue. Pertinent negatives for diabetes include no chest pain, no polydipsia, no polyphagia and no polyuria. There are no diabetic complications. Risk factors for coronary artery disease include tobacco exposure, hypertension and dyslipidemia. Current diabetic treatment includes insulin injections and oral agent (monotherapy). She is compliant with treatment most of the time. Her weight is stable. Her home blood glucose trend is increasing steadily. Her overall blood glucose range is 140-180 mg/dl. An ACE inhibitor/angiotensin II receptor blocker is being taken.  Hyperlipidemia This is a chronic problem. The current episode started more than 1 year ago. The problem is controlled. Pertinent negatives include no chest pain, myalgias or shortness of breath. Current antihyperlipidemic treatment includes bile acid squestrants.  Hypertension This is a chronic problem. The current episode started more than 1 year ago. The problem is controlled. Pertinent negatives include no chest pain, headaches, palpitations or shortness of breath.     Review of Systems  Constitutional: Positive for fatigue. Negative for unexpected weight change.  HENT: Negative for trouble swallowing and voice change.   Eyes: Negative for visual disturbance.  Respiratory: Negative for cough, shortness of breath and wheezing.   Cardiovascular: Negative for chest pain, palpitations and leg swelling.  Gastrointestinal: Negative for nausea, vomiting and diarrhea.  Endocrine: Negative for cold intolerance, heat intolerance,  polydipsia, polyphagia and polyuria.  Musculoskeletal: Negative for myalgias and arthralgias.  Skin: Negative for color change, pallor, rash and wound.  Neurological: Negative for seizures and headaches.  Psychiatric/Behavioral: Negative for suicidal ideas and confusion.    Objective:    BP 162/67 mmHg  Pulse 86  Ht 5\' 6"  (1.676 m)  Wt 157 lb (71.215 kg)  BMI 25.35 kg/m2  SpO2 99%  Wt Readings from Last 3 Encounters:  07/06/15 157 lb (71.215 kg)  07/12/14 152 lb (68.947 kg)  10/13/13 159 lb 1.6 oz (72.167 kg)    Physical Exam  Constitutional: She is oriented to person, place, and time. She appears well-developed.  HENT:  Head: Normocephalic and atraumatic.  Eyes: EOM are normal.  Neck: Normal range of motion. Neck supple. No tracheal deviation present. No thyromegaly present.  Cardiovascular: Normal rate and regular rhythm.   Pulmonary/Chest: Effort normal and breath  sounds normal.  Abdominal: Soft. Bowel sounds are normal. There is no tenderness. There is no guarding.  Musculoskeletal: Normal range of motion. She exhibits no edema.  Neurological: She is alert and oriented to person, place, and time. She has normal reflexes. No cranial nerve deficit. Coordination normal.  Skin: Skin is warm and dry. No rash noted. No erythema. No pallor.  Psychiatric: She has a normal mood and affect. Judgment normal.    Results for orders placed or performed during the hospital encounter of 10/18/13  Glucose, capillary  Result Value Ref Range   Glucose-Capillary 173 (H) 70 - 99 mg/dL  Glucose, capillary  Result Value Ref Range   Glucose-Capillary 216 (H) 70 - 99 mg/dL   Comment 1 Notify RN   Glucose, capillary  Result Value Ref Range   Glucose-Capillary 227 (H) 70 - 99 mg/dL  Glucose, capillary  Result Value Ref Range   Glucose-Capillary 263 (H) 70 - 99 mg/dL  Glucose, capillary  Result Value Ref Range   Glucose-Capillary 179 (H) 70 - 99 mg/dL   Comment 1 Documented in Chart    Glucose, capillary  Result Value Ref Range   Glucose-Capillary 166 (H) 70 - 99 mg/dL  Glucose, capillary  Result Value Ref Range   Glucose-Capillary 174 (H) 70 - 99 mg/dL  Glucose, capillary  Result Value Ref Range   Glucose-Capillary 163 (H) 70 - 99 mg/dL  Glucose, capillary  Result Value Ref Range   Glucose-Capillary 156 (H) 70 - 99 mg/dL   Complete Blood Count (Most recent): Lab Results  Component Value Date   WBC 8.6 10/13/2013   HGB 15.1* 10/13/2013   HCT 44.2 10/13/2013   MCV 85.0 10/13/2013   PLT 270 10/13/2013   Chemistry (most recent): Lab Results  Component Value Date   NA 139 10/13/2013   K 4.5 10/13/2013   CL 99 10/13/2013   CO2 27 10/13/2013   BUN 16 10/13/2013   CREATININE 0.73 10/13/2013   Diabetic Labs (most recent): No results found for: HGBA1C Lipid profile (most recent): No results found for: TRIG, CHOL       Assessment & Plan:   1. Uncontrolled type 2 diabetes mellitus without complication, with long-term current use of insulin ([place])   Patient came with above target glucose profile, and  recent A1c of 7.7 %.  Glucose logs and insulin administration records pertaining to this visit,  to be scanned into patient's records.  Recent labs reviewed. -Patient remains at a high risk for more acute and chronic complications of diabetes which include CAD, CVA, CKD, retinopathy, and neuropathy. These are all discussed in detail with the patient. -I have re-counseled the patient on diet management and weight loss, by adopting a carbohydrate restricted / protein rich  Diet. Patient is advised to stick to a routine mealtimes to eat 3 meals  a day and avoid unnecessary snacks ( to snack only to correct hypoglycemia).  -Suggestion is made for patient to avoid simple carbohydrates   from their diet including Cakes , Desserts, Ice Cream,  Soda (  diet and regular) , Sweet Tea , Candies,  Chips, Cookies, Artificial Sweeteners,   and "Sugar-free" Products .  This  will help patient to have stable blood glucose profile and potentially lose weight.  -The patient is provided with individualized DM education. -I have approached patient to continue on  intensive monitoring of blood glucose and  Therapy as follows, and patient agrees.  - I will proceed to increase basal insulin  Levemir to 44 units QHS, associated with strict monitoring of glucose  every morning.  . -Patient is encouraged to call clinic for blood glucose levels less than 70 or above 300 mg /dl. - I will increase metformin to 1000 mg by mouth twice a day, therapeutically suitable for patient..   - Patient specific target  for A1c; LDL, HDL, Triglycerides, and  Waist Circumference were discussed in detail.  2) BP/HTN: Controlled. Continue current medications including ACEI. 3) Lipids/HPL: Intolerant to statins .continue WelCho  4)  Weight/Diet: exercise, and carbohydrates information provided.  5) Chronic Care/Health Maintenance: -Patient on ACEI medications and encouraged to continue to follow up with Ophthalmology, Podiatrist at least yearly or according to recommendations, and advised to quit smoking. I have recommended yearly flu vaccine and pneumonia vaccination at least every 5 years; moderate intensity exercise for up to 150 minutes weekly; and  sleep for at least 7 hours a day. 6) hypothyroidism: She is clinically euthyroid. Her TFTs are consistent with appropriate replacement. I advised her to continue levothyroxine 50 g by mouth every a.m.  Patient to bring meter and  blood glucose logs during their next visit. 7) history of hyperparathyroidism: She is status post parathyroidectomy. Calcium is normal.  Follow up plan: Return in about 3 months (around 10/06/2015) for diabetes, high blood pressure, high cholesterol, follow up with pre-visit labs, meter, and logs.  Glade Lloyd, MD Phone: [phone]  Fax: [phone]   07/06/2015, 1:56 PM

## 2015-07-06 NOTE — Patient Instructions (Signed)

## 2015-07-10 ENCOUNTER — Other Ambulatory Visit: Payer: Self-pay

## 2015-07-10 MED ORDER — METFORMIN HCL 1000 MG PO TABS: 1000.0000 mg | ORAL_TABLET | Freq: Two times a day (BID) | ORAL | Status: DC

## 2015-07-11 ENCOUNTER — Telehealth: Payer: Self-pay | Admitting: "Endocrinology

## 2015-07-11 ENCOUNTER — Encounter: Payer: Self-pay | Admitting: "Endocrinology

## 2015-07-11 NOTE — Telephone Encounter (Signed)
Test

## 2015-08-15 ENCOUNTER — Other Ambulatory Visit: Payer: Self-pay | Admitting: "Endocrinology

## 2015-08-31 ENCOUNTER — Other Ambulatory Visit (INDEPENDENT_AMBULATORY_CARE_PROVIDER_SITE_OTHER): Payer: Self-pay | Admitting: Internal Medicine

## 2015-09-29 ENCOUNTER — Other Ambulatory Visit: Payer: Self-pay | Admitting: "Endocrinology

## 2015-09-29 LAB — BASIC METABOLIC PANEL
BUN: 16 mg/dL (ref 7–25)
CO2: 28 mmol/L (ref 20–31)
Calcium: 9.5 mg/dL (ref 8.6–10.4)
Chloride: 104 mmol/L (ref 98–110)
Creat: 0.82 mg/dL (ref 0.50–0.99)
Glucose, Bld: 145 mg/dL — ABNORMAL HIGH (ref 65–99)
POTASSIUM: 5.3 mmol/L (ref 3.5–5.3)
SODIUM: 139 mmol/L (ref 135–146)

## 2015-09-30 LAB — HEMOGLOBIN A1C
HEMOGLOBIN A1C: 6.8 % — AB (ref <5.7)
Mean Plasma Glucose: 148 mg/dL — ABNORMAL HIGH (ref <117)

## 2015-09-30 LAB — TSH: TSH: 1.619 u[IU]/mL (ref 0.350–4.500)

## 2015-09-30 LAB — T4, FREE: Free T4: 1.15 ng/dL (ref 0.80–1.80)

## 2015-10-06 ENCOUNTER — Encounter: Payer: Self-pay | Admitting: "Endocrinology

## 2015-10-06 ENCOUNTER — Ambulatory Visit (INDEPENDENT_AMBULATORY_CARE_PROVIDER_SITE_OTHER): Payer: BLUE CROSS/BLUE SHIELD | Admitting: "Endocrinology

## 2015-10-06 VITALS — BP 143/73 | HR 99 | Ht 66.0 in | Wt 154.0 lb

## 2015-10-06 DIAGNOSIS — E785 Hyperlipidemia, unspecified: Secondary | ICD-10-CM | POA: Diagnosis not present

## 2015-10-06 DIAGNOSIS — E1165 Type 2 diabetes mellitus with hyperglycemia: Secondary | ICD-10-CM

## 2015-10-06 DIAGNOSIS — Z794 Long term (current) use of insulin: Secondary | ICD-10-CM

## 2015-10-06 DIAGNOSIS — E039 Hypothyroidism, unspecified: Secondary | ICD-10-CM

## 2015-10-06 DIAGNOSIS — IMO0001 Reserved for inherently not codable concepts without codable children: Secondary | ICD-10-CM

## 2015-10-06 DIAGNOSIS — I10 Essential (primary) hypertension: Secondary | ICD-10-CM

## 2015-10-06 MED ORDER — ONETOUCH ULTRASOFT LANCETS MISC: Status: DC

## 2015-10-06 NOTE — Progress Notes (Signed)
Subjective:    Patient ID: [name], female    DOB: June 01, 1951,    Past Medical History  Diagnosis Date  . Generalized headaches   . Weakness   . Herniated disc, cervical   . Hypertension     takes Enalapril dialy  . GERD (gastroesophageal reflux disease)     takes Omeprazole daily  . Hypothyroidism     takes Synthroid daily  . PONV (postoperative nausea and vomiting)   . Hyperlipidemia     takes Welchol daily  . Asthma     takes Singulair nightly  . TIA (transient ischemic attack)     diagnosed by EEG but never knew she had it  . Trigger finger   . Weakness     in both hands  . Arthritis   . Joint pain     hands and neck  . IBS (irritable bowel syndrome)   . History of kidney stones   . Nocturia   . Diabetes mellitus     takes levemir daily and Metformin daily  . Seizures ([place])     denies   Past Surgical History  Procedure Laterality Date  . Abdominal hysterectomy  1980  . Carpel tunnel Bilateral     left first then right  . Herniated disc   10/2000, 08/31/2008    C5-C6 then C6-C7  . Lithotripsy  11/12/2007    right side  . Eye surgery  11/18/2008    right - cataracts  . Eye surgery  06/01/2009    left - cataracts  . Tonsillectomy      as a child  . Appendectomy      done with hysterectomy  . Parathyroidectomy  07/02/2012    Procedure: PARATHYROIDECTOMY;  Surgeon: Earnstine Regal, MD;  Location: [facility];  Service: General;  Laterality: N/A;  . Parathyroid surgery      October 2013  . Knee anthroscopy bilateral Bilateral   . Cataract implants    . Back surgery  2007    lumbar fusion  . Esophagogastroduodenoscopy      hx of h pylori  . Colonoscopy    . Anterior cervical decomp/discectomy fusion N/A 10/18/2013    Procedure: Cervical three-four, Cervical four-five, Cervical seven-Thoracic one anterior cervical decompression with fusion plating and bonegraft;  Surgeon: Hosie Spangle, MD;  Location: [place] [facility];  Service: Neurosurgery;  Laterality:  N/A;  Cervical three-four, Cervical four-five, Cervical seven-Thoracic one anterior cervical decompression with fusion plating and bonegraft   Social History   Social History  . Marital Status: Married    Spouse Name: N/A  . Number of Children: N/A  . Years of Education: N/A   Social History Main Topics  . Smoking status: Former Smoker -- 1.00 packs/day for 40 years  . Smokeless tobacco: Never Used     Comment: quit smoking in Oct 2013  . Alcohol Use: No  . Drug Use: No  . Sexual Activity: Not Asked   Other Topics Concern  . None   Social History Narrative   Outpatient Encounter Prescriptions as of 10/06/2015  Medication Sig  . colesevelam (WELCHOL) 625 MG tablet Take 625 mg by mouth daily.   . enalapril (VASOTEC) 10 MG tablet Take 10 mg by mouth daily.  . Insulin Detemir (LEVEMIR FLEXTOUCH) 100 UNIT/ML Pen Increased to 44 units qhs  . levothyroxine (SYNTHROID, LEVOTHROID) 50 MCG tablet Take 50 mcg by mouth daily before breakfast.  . metFORMIN (GLUCOPHAGE) 1000 MG tablet Take 1  tablet (1,000 mg total) by mouth 2 (two) times daily with a meal.  . montelukast (SINGULAIR) 10 MG tablet Take 10 mg by mouth at bedtime.   Marland Kitchen omeprazole (PRILOSEC) 20 MG capsule Take 20 mg by mouth daily.  Marland Kitchen omeprazole (PRILOSEC) 20 MG capsule TAKE ONE CAPSULE EVERY DAY  . traMADol (ULTRAM) 50 MG tablet Take 1 tablet (50 mg total) by mouth every 6 (six) hours as needed.  . Lancets (ONETOUCH ULTRASOFT) lancets Use as instructed  . [DISCONTINUED] cephALEXin (KEFLEX) 500 MG capsule Take 1 capsule (500 mg total) by mouth 3 (three) times daily. For 10 days  . [DISCONTINUED] insulin detemir (LEVEMIR) 100 UNIT/ML injection Inject 20 Units into the skin at bedtime.   No facility-administered encounter medications on file as of 10/06/2015.   ALLERGIES: Allergies  Allergen Reactions  . Advair Diskus [Fluticasone-Salmeterol] Anaphylaxis  . Codeine Nausea And Vomiting  . Hydrocodone-Acetaminophen Nausea And  Vomiting  . Oxycodone-Acetaminophen Nausea And Vomiting  . Tetracyclines & Related Itching    Internal   VACCINATION STATUS: Immunization History  Administered Date(s) Administered  . Tdap 07/12/2014    Diabetes She presents for her follow-up diabetic visit. She has type 2 diabetes mellitus. Onset time: Diagnosed approximately at age  64 years. Her disease course has been improving. Pertinent negatives for hypoglycemia include no confusion, headaches, pallor or seizures. Associated symptoms include fatigue. Pertinent negatives for diabetes include no chest pain, no polydipsia, no polyphagia and no polyuria. Symptoms are improving. There are no diabetic complications. Risk factors for coronary artery disease include tobacco exposure, hypertension and dyslipidemia. Current diabetic treatment includes insulin injections and oral agent (monotherapy). She is compliant with treatment most of the time. Her weight is stable. Her home blood glucose trend is increasing steadily. Her breakfast blood glucose range is generally 140-180 mg/dl. Her overall blood glucose range is 140-180 mg/dl. An ACE inhibitor/angiotensin II receptor blocker is being taken.  Hyperlipidemia This is a chronic problem. The current episode started more than 1 year ago. The problem is controlled. Pertinent negatives include no chest pain, myalgias or shortness of breath. Current antihyperlipidemic treatment includes bile acid squestrants.  Hypertension This is a chronic problem. The current episode started more than 1 year ago. The problem is controlled. Pertinent negatives include no chest pain, headaches, palpitations or shortness of breath.     Review of Systems  Constitutional: Positive for fatigue. Negative for unexpected weight change.  HENT: Negative for trouble swallowing and voice change.   Eyes: Negative for visual disturbance.  Respiratory: Negative for cough, shortness of breath and wheezing.   Cardiovascular:  Negative for chest pain, palpitations and leg swelling.  Gastrointestinal: Negative for nausea, vomiting and diarrhea.  Endocrine: Negative for cold intolerance, heat intolerance, polydipsia, polyphagia and polyuria.  Musculoskeletal: Negative for myalgias and arthralgias.  Skin: Negative for color change, pallor, rash and wound.  Neurological: Negative for seizures and headaches.  Psychiatric/Behavioral: Negative for suicidal ideas and confusion.    Objective:    BP 143/73 mmHg  Pulse 99  Ht 5\' 6"  (1.676 m)  Wt 154 lb (69.854 kg)  BMI 24.87 kg/m2  SpO2 97%  Wt Readings from Last 3 Encounters:  10/06/15 154 lb (69.854 kg)  07/06/15 157 lb (71.215 kg)  07/12/14 152 lb (68.947 kg)    Physical Exam  Constitutional: She is oriented to person, place, and time. She appears well-developed.  HENT:  Head: Normocephalic and atraumatic.  Eyes: EOM are normal.  Neck: Normal range of motion. Neck  supple. No tracheal deviation present. No thyromegaly present.  Cardiovascular: Normal rate and regular rhythm.   Pulmonary/Chest: Effort normal and breath sounds normal.  Abdominal: Soft. Bowel sounds are normal. There is no tenderness. There is no guarding.  Musculoskeletal: Normal range of motion. She exhibits no edema.  Neurological: She is alert and oriented to person, place, and time. She has normal reflexes. No cranial nerve deficit. Coordination normal.  Skin: Skin is warm and dry. No rash noted. No erythema. No pallor.  Psychiatric: She has a normal mood and affect. Judgment normal.    Results for orders placed or performed in visit on [id number]  Basic metabolic panel  Result Value Ref Range   Sodium 139 135 - 146 mmol/L   Potassium 5.3 3.5 - 5.3 mmol/L   Chloride 104 98 - 110 mmol/L   CO2 28 20 - 31 mmol/L   Glucose, Bld 145 (H) 65 - 99 mg/dL   BUN 16 7 - 25 mg/dL   Creat 0.82 0.50 - 0.99 mg/dL   Calcium 9.5 8.6 - 10.4 mg/dL  TSH  Result Value Ref Range   TSH 1.619 0.350 - 4.500  uIU/mL  T4, free  Result Value Ref Range   Free T4 1.15 0.80 - 1.80 ng/dL  Hemoglobin A1c  Result Value Ref Range   Hgb A1c MFr Bld 6.8 (H) <5.7 %   Mean Plasma Glucose 148 (H) <117 mg/dL   Complete Blood Count (Most recent): Lab Results  Component Value Date   WBC 8.6 10/13/2013   HGB 15.1* 10/13/2013   HCT 44.2 10/13/2013   MCV 85.0 10/13/2013   PLT 270 10/13/2013   Chemistry (most recent): Lab Results  Component Value Date   NA 139 09/29/2015   K 5.3 09/29/2015   CL 104 09/29/2015   CO2 28 09/29/2015   BUN 16 09/29/2015   CREATININE 0.82 09/29/2015   Diabetic Labs (most recent): Lab Results  Component Value Date   HGBA1C 6.8* 09/29/2015    Assessment & Plan:   1. Uncontrolled type 2 diabetes mellitus without complication, with long-term current use of insulin ([place])   Patient came with target glucose profile, and  recent A1c of 6.8% overall improving from 11.5 %.  Glucose logs and insulin administration records pertaining to this visit,  to be scanned into patient's records.  Recent labs reviewed. -Patient remains at a high risk for more acute and chronic complications of diabetes which include CAD, CVA, CKD, retinopathy, and neuropathy. These are all discussed in detail with the patient. -I have re-counseled the patient on diet management and weight loss, by adopting a carbohydrate restricted / protein rich  Diet. Patient is advised to stick to a routine mealtimes to eat 3 meals  a day and avoid unnecessary snacks ( to snack only to correct hypoglycemia).  -Suggestion is made for patient to avoid simple carbohydrates   from their diet including Cakes , Desserts, Ice Cream,  Soda (  diet and regular) , Sweet Tea , Candies,  Chips, Cookies, Artificial Sweeteners,   and "Sugar-free" Products .  This will help patient to have stable blood glucose profile and potentially lose weight.  -The patient is provided with individualized DM education. -I have approached patient to  continue on  intensive monitoring of blood glucose and  Therapy as follows, and patient agrees.  - I will proceed with basal insulin Levemir  44 units QHS, associated with strict monitoring of glucose  every morning.   -Patient is encouraged  to call clinic for blood glucose levels less than 70 or above 300 mg /dl. - I will continue metformin 1000 mg by mouth twice a day, therapeutically suitable for patient..   - Patient specific target  for A1c; LDL, HDL, Triglycerides, and  Waist Circumference were discussed in detail.  2) BP/HTN: Controlled. Continue current medications including ACEI. 3) Lipids/HPL: Intolerant to statins .continue WelCho  4)  Weight/Diet: exercise, and carbohydrates information provided.  5) Chronic Care/Health Maintenance: -Patient on ACEI medications and encouraged to continue to follow up with Ophthalmology, Podiatrist at least yearly or according to recommendations, and advised to quit smoking. I have recommended yearly flu vaccine and pneumonia vaccination at least every 5 years; moderate intensity exercise for up to 150 minutes weekly; and  sleep for at least 7 hours a day. 6) hypothyroidism: She is clinically euthyroid. Her TFTs are consistent with appropriate replacement. I advised her to continue levothyroxine 50 g by mouth every a.m.  Patient to bring meter and  blood glucose logs during their next visit. 7) history of hyperparathyroidism: She is status post parathyroidectomy. Calcium is normal.  Follow up plan: Return in about 3 months (around 01/04/2016) for diabetes, high blood pressure, high cholesterol, underactive thyroid, follow up with pre-visit labs, meter, and logs.  Glade Lloyd, MD Phone: [phone]  Fax: [phone]   10/06/2015, 3:02 PM

## 2015-10-06 NOTE — Patient Instructions (Signed)

## 2015-10-30 DIAGNOSIS — I10 Essential (primary) hypertension: Secondary | ICD-10-CM | POA: Diagnosis not present

## 2015-10-30 DIAGNOSIS — J019 Acute sinusitis, unspecified: Secondary | ICD-10-CM | POA: Diagnosis not present

## 2015-10-30 DIAGNOSIS — E1165 Type 2 diabetes mellitus with hyperglycemia: Secondary | ICD-10-CM | POA: Diagnosis not present

## 2015-10-30 DIAGNOSIS — Z1389 Encounter for screening for other disorder: Secondary | ICD-10-CM | POA: Diagnosis not present

## 2015-10-30 DIAGNOSIS — R079 Chest pain, unspecified: Secondary | ICD-10-CM | POA: Diagnosis not present

## 2015-10-30 DIAGNOSIS — E119 Type 2 diabetes mellitus without complications: Secondary | ICD-10-CM | POA: Diagnosis not present

## 2015-10-30 DIAGNOSIS — Z6823 Body mass index (BMI) 23.0-23.9, adult: Secondary | ICD-10-CM | POA: Diagnosis not present

## 2015-10-31 ENCOUNTER — Other Ambulatory Visit (HOSPITAL_COMMUNITY): Payer: Self-pay | Admitting: Internal Medicine

## 2015-10-31 ENCOUNTER — Ambulatory Visit (HOSPITAL_COMMUNITY)
Admission: RE | Admit: 2015-10-31 | Discharge: 2015-10-31 | Disposition: A | Discharge status: Home / Self Care | Payer: BLUE CROSS/BLUE SHIELD | Source: Ambulatory Visit | Attending: Internal Medicine | Admitting: Internal Medicine

## 2015-10-31 DIAGNOSIS — R079 Chest pain, unspecified: Secondary | ICD-10-CM | POA: Insufficient documentation

## 2015-10-31 DIAGNOSIS — Z1389 Encounter for screening for other disorder: Secondary | ICD-10-CM | POA: Insufficient documentation

## 2015-10-31 DIAGNOSIS — R05 Cough: Secondary | ICD-10-CM | POA: Diagnosis not present

## 2015-11-20 ENCOUNTER — Encounter: Payer: Self-pay | Admitting: Cardiovascular Disease

## 2015-11-20 ENCOUNTER — Ambulatory Visit (INDEPENDENT_AMBULATORY_CARE_PROVIDER_SITE_OTHER): Payer: BLUE CROSS/BLUE SHIELD | Admitting: Cardiovascular Disease

## 2015-11-20 VITALS — BP 132/64 | HR 92 | Ht 66.0 in | Wt 151.0 lb

## 2015-11-20 DIAGNOSIS — Z8249 Family history of ischemic heart disease and other diseases of the circulatory system: Secondary | ICD-10-CM

## 2015-11-20 DIAGNOSIS — R9431 Abnormal electrocardiogram [ECG] [EKG]: Secondary | ICD-10-CM

## 2015-11-20 DIAGNOSIS — I10 Essential (primary) hypertension: Secondary | ICD-10-CM | POA: Diagnosis not present

## 2015-11-20 DIAGNOSIS — R0602 Shortness of breath: Secondary | ICD-10-CM

## 2015-11-20 DIAGNOSIS — R072 Precordial pain: Secondary | ICD-10-CM

## 2015-11-20 NOTE — Progress Notes (Signed)
Patient ID: [name], female   DOB: Sep 25, 1951, 65 y.o.   MRN: [id number]       CARDIOLOGY CONSULT NOTE  Patient ID: [name] MRN: [id number] DOB/AGE: [birth year] 65 y.o.  Admit date: (Not on file) Primary Physician Glo Herring., MD  Reason for Consultation: chest pain  HPI: 65 yr old woman with insulin-dependent diabetes and hypertension referred for chest pain evaluation. HbA1C 6.8% 09/29/15.  Chest xray no active disease 10/31/15.  ECG today shows sinus rhythm with diffuse nonspecific ST-T abnormalities.  Chest pain x 2 months, worse with exertion (walking dogs, doing laundry). Retrosternal and left inframammary region with occasional radiation into neck and jaw. Associated shortness of breath. Denies orthopnea, PND, and leg swelling.  Has had significant dizziness and one presyncopal episode two weeks ago. Also has indigestion and solid food dysphagia at times. Bothered by cough as well.  Now taking ASA 81 mg as per PCP instruction.  Soc: Former smoker.  Fam: Mother had MI in early 28's, CABG at 17, died of cancer.   Allergies  Allergen Reactions  . Advair Diskus [Fluticasone-Salmeterol] Anaphylaxis  . Codeine Nausea And Vomiting  . Hydrocodone-Acetaminophen Nausea And Vomiting  . Oxycodone-Acetaminophen Nausea And Vomiting  . Tetracyclines & Related Itching    Internal    Current Outpatient Prescriptions  Medication Sig Dispense Refill  . colesevelam (WELCHOL) 625 MG tablet Take 625 mg by mouth daily.     . enalapril (VASOTEC) 10 MG tablet Take 10 mg by mouth daily.    . Insulin Detemir (LEVEMIR FLEXTOUCH) 100 UNIT/ML Pen Increased to 44 units qhs 45 mL 0  . Lancets (ONETOUCH ULTRASOFT) lancets Use as instructed 100 each 12  . levothyroxine (SYNTHROID, LEVOTHROID) 50 MCG tablet Take 50 mcg by mouth daily before breakfast.    . metFORMIN (GLUCOPHAGE) 1000 MG tablet Take 1 tablet (1,000 mg total) by mouth 2 (two) times daily with a meal. 180  tablet 1  . montelukast (SINGULAIR) 10 MG tablet Take 10 mg by mouth at bedtime.     . pantoprazole (PROTONIX) 40 MG tablet Take 40 mg by mouth daily.     No current facility-administered medications for this visit.    Past Medical History  Diagnosis Date  . Generalized headaches   . Weakness   . Herniated disc, cervical   . Hypertension     takes Enalapril dialy  . GERD (gastroesophageal reflux disease)     takes Omeprazole daily  . Hypothyroidism     takes Synthroid daily  . PONV (postoperative nausea and vomiting)   . Hyperlipidemia     takes Welchol daily  . Asthma     takes Singulair nightly  . TIA (transient ischemic attack)     diagnosed by EEG but never knew she had it  . Trigger finger   . Weakness     in both hands  . Arthritis   . Joint pain     hands and neck  . IBS (irritable bowel syndrome)   . History of kidney stones   . Nocturia   . Diabetes mellitus     takes levemir daily and Metformin daily  . Seizures ([place])     denies    Past Surgical History  Procedure Laterality Date  . Abdominal hysterectomy  1980  . Carpel tunnel Bilateral     left first then right  . Herniated disc   10/2000, 08/31/2008    C5-C6 then C6-C7  . Lithotripsy  11/12/2007  right side  . Eye surgery  11/18/2008    right - cataracts  . Eye surgery  06/01/2009    left - cataracts  . Tonsillectomy      as a child  . Appendectomy      done with hysterectomy  . Parathyroidectomy  07/02/2012    Procedure: PARATHYROIDECTOMY;  Surgeon: Earnstine Regal, MD;  Location: [facility];  Service: General;  Laterality: N/A;  . Parathyroid surgery      October 2013  . Knee anthroscopy bilateral Bilateral   . Cataract implants    . Back surgery  2007    lumbar fusion  . Esophagogastroduodenoscopy      hx of h pylori  . Colonoscopy    . Anterior cervical decomp/discectomy fusion N/A 10/18/2013    Procedure: Cervical three-four, Cervical four-five, Cervical seven-Thoracic one anterior cervical  decompression with fusion plating and bonegraft;  Surgeon: Hosie Spangle, MD;  Location: [place] [facility];  Service: Neurosurgery;  Laterality: N/A;  Cervical three-four, Cervical four-five, Cervical seven-Thoracic one anterior cervical decompression with fusion plating and bonegraft    Social History   Social History  . Marital Status: Married    Spouse Name: N/A  . Number of Children: N/A  . Years of Education: N/A   Occupational History  . Not on file.   Social History Main Topics  . Smoking status: Former Smoker -- 1.00 packs/day for 40 years    Quit date: 07/19/2012  . Smokeless tobacco: Never Used     Comment: quit smoking in Oct 2013  . Alcohol Use: No  . Drug Use: No  . Sexual Activity: Not on file   Other Topics Concern  . Not on file   Social History Narrative       Prior to Admission medications   Medication Sig Start Date End Date Taking? Authorizing Provider  colesevelam (WELCHOL) 625 MG tablet Take 625 mg by mouth daily.    Yes Historical Provider, MD  enalapril (VASOTEC) 10 MG tablet Take 10 mg by mouth daily.   Yes Historical Provider, MD  Insulin Detemir (LEVEMIR FLEXTOUCH) 100 UNIT/ML Pen Increased to 44 units qhs 08/15/15  Yes Cassandria Anger, MD  Lancets [facility] ULTRASOFT) lancets Use as instructed 10/06/15  Yes Cassandria Anger, MD  levothyroxine (SYNTHROID, LEVOTHROID) 50 MCG tablet Take 50 mcg by mouth daily before breakfast.   Yes Historical Provider, MD  metFORMIN (GLUCOPHAGE) 1000 MG tablet Take 1 tablet (1,000 mg total) by mouth 2 (two) times daily with a meal. 07/10/15  Yes Cassandria Anger, MD  montelukast (SINGULAIR) 10 MG tablet Take 10 mg by mouth at bedtime.  06/03/12  Yes Historical Provider, MD  pantoprazole (PROTONIX) 40 MG tablet Take 40 mg by mouth daily.   Yes Historical Provider, MD     Review of systems complete and found to be negative unless listed above in HPI     Physical exam Blood pressure 132/64, pulse 92,  height 5\' 6"  (1.676 m), weight 151 lb (68.493 kg), SpO2 99 %. General: NAD Neck: No JVD, no thyromegaly or thyroid nodule.  Lungs: Clear to auscultation bilaterally with normal respiratory effort. CV: Nondisplaced PMI. Regular rate and rhythm, normal S1/S2, no S3/S4, no murmur.  + left-sided chest wall tenderness near 2nd IC space. No peripheral edema.  No carotid bruit.  Normal pedal pulses.  Abdomen: Soft, nontender, no hepatosplenomegaly, no distention.  Skin: Intact without lesions or rashes.  Neurologic: Alert and oriented x 3.  Psych: Normal affect. Extremities: No clubbing or cyanosis.  HEENT: Normal.   ECG: Most recent ECG reviewed.  Labs:   Lab Results  Component Value Date   WBC 8.6 10/13/2013   HGB 15.1* 10/13/2013   HCT 44.2 10/13/2013   MCV 85.0 10/13/2013   PLT 270 10/13/2013   No results for input(s): NA, K, CL, CO2, BUN, CREATININE, CALCIUM, PROT, BILITOT, ALKPHOS, ALT, AST, GLUCOSE in the last 168 hours.  Invalid input(s): LABALBU No results found for: CKTOTAL, CKMB, CKMBINDEX, TROPONINI No results found for: CHOL No results found for: HDL No results found for: LDLCALC No results found for: TRIG No results found for: CHOLHDL No results found for: LDLDIRECT       Studies: No results found.  ASSESSMENT AND PLAN:  1. Chest pain and shortness of breath: Symptoms concerning for ischemic heart disease with associated abnormal ECG. On ASA. Likely also warrants statin therapy given concomitant h/o diabetes. Will arrange for exercise Cardiolite stress test for further clarification. I will order a 2-D echocardiogram with Doppler to evaluate cardiac structure, function, and regional wall motion. Would consider addition of low-dose beta blockers for anti-anginal properties at next visit.  2. Essential HTN: Controlled. No changes.  Dispo: f/u 3 weeks.   Signed: Kate Sable, M.D., F.A.C.C.  11/20/2015, 8:39 AM

## 2015-11-20 NOTE — Patient Instructions (Signed)
Your physician recommends that you schedule a follow-up appointment in: 3 weeks with Dr Bronson Ing    Your physician recommends that you continue on your current medications as directed. Please refer to the Current Medication list given to you today.    Your physician has requested that you have an echocardiogram. Echocardiography is a painless test that uses sound waves to create images of your heart. It provides your doctor with information about the size and shape of your heart and how well your heart's chambers and valves are working. This procedure takes approximately one hour. There are no restrictions for this procedure.    Your physician has requested that you have en exercise stress myoview. For further information please visit [email or web address]. Please follow instruction sheet, as given.     If you need a refill on your cardiac medications before your next appointment, please call your pharmacy.       Thank you for choosing [facility] !

## 2015-11-22 ENCOUNTER — Telehealth: Payer: Self-pay | Admitting: Cardiovascular Disease

## 2015-11-22 ENCOUNTER — Ambulatory Visit (HOSPITAL_COMMUNITY)
Admission: RE | Admit: 2015-11-22 | Discharge: 2015-11-22 | Disposition: A | Discharge status: Home / Self Care | Payer: BLUE CROSS/BLUE SHIELD | Source: Ambulatory Visit | Attending: Cardiovascular Disease | Admitting: Cardiovascular Disease

## 2015-11-22 ENCOUNTER — Encounter (HOSPITAL_COMMUNITY)
Admission: RE | Admit: 2015-11-22 | Discharge: 2015-11-22 | Disposition: A | Discharge status: Home / Self Care | Payer: BLUE CROSS/BLUE SHIELD | Source: Ambulatory Visit | Attending: Cardiovascular Disease | Admitting: Cardiovascular Disease

## 2015-11-22 ENCOUNTER — Other Ambulatory Visit: Payer: Self-pay | Admitting: *Deleted

## 2015-11-22 ENCOUNTER — Inpatient Hospital Stay (HOSPITAL_COMMUNITY): Admission: RE | Admit: 2015-11-22 | Payer: BLUE CROSS/BLUE SHIELD | Source: Ambulatory Visit

## 2015-11-22 ENCOUNTER — Encounter (HOSPITAL_COMMUNITY): Payer: Self-pay

## 2015-11-22 DIAGNOSIS — R9439 Abnormal result of other cardiovascular function study: Secondary | ICD-10-CM | POA: Insufficient documentation

## 2015-11-22 DIAGNOSIS — E785 Hyperlipidemia, unspecified: Secondary | ICD-10-CM | POA: Insufficient documentation

## 2015-11-22 DIAGNOSIS — R0602 Shortness of breath: Secondary | ICD-10-CM | POA: Insufficient documentation

## 2015-11-22 DIAGNOSIS — I371 Nonrheumatic pulmonary valve insufficiency: Secondary | ICD-10-CM | POA: Insufficient documentation

## 2015-11-22 DIAGNOSIS — E119 Type 2 diabetes mellitus without complications: Secondary | ICD-10-CM | POA: Insufficient documentation

## 2015-11-22 DIAGNOSIS — R072 Precordial pain: Secondary | ICD-10-CM

## 2015-11-22 DIAGNOSIS — I071 Rheumatic tricuspid insufficiency: Secondary | ICD-10-CM | POA: Diagnosis not present

## 2015-11-22 DIAGNOSIS — K219 Gastro-esophageal reflux disease without esophagitis: Secondary | ICD-10-CM | POA: Diagnosis not present

## 2015-11-22 DIAGNOSIS — I10 Essential (primary) hypertension: Secondary | ICD-10-CM | POA: Diagnosis not present

## 2015-11-22 DIAGNOSIS — R079 Chest pain, unspecified: Secondary | ICD-10-CM | POA: Insufficient documentation

## 2015-11-22 LAB — NM MYOCAR MULTI W/SPECT W/WALL MOTION / EF
CHL CUP MPHR: 154 {beats}/min
CHL CUP NUCLEAR SDS: 2
CHL CUP RESTING HR STRESS: 79 {beats}/min
CHL RATE OF PERCEIVED EXERTION: 13
CSEPEDS: 24 s
CSEPEW: 8.2 METS
CSEPHR: 96 %
CSEPPHR: 148 {beats}/min
Exercise duration (min): 6 min
LVDIAVOL: 43 mL
LVSYSVOL: 10 mL
RATE: 0.37
SRS: 0
SSS: 2
TID: 0.82

## 2015-11-22 MED ORDER — TECHNETIUM TC 99M SESTAMIBI GENERIC - CARDIOLITE
10.0000 | Freq: Once | INTRAVENOUS | Status: AC | PRN
  Administered 2015-11-22: 10.4 via INTRAVENOUS

## 2015-11-22 MED ORDER — REGADENOSON 0.4 MG/5ML IV SOLN
INTRAVENOUS | Status: AC
  Filled 2015-11-22: qty 5

## 2015-11-22 MED ORDER — METOPROLOL TARTRATE 25 MG PO TABS: 25.0000 mg | ORAL_TABLET | Freq: Two times a day (BID) | ORAL | Status: DC

## 2015-11-22 MED ORDER — SODIUM CHLORIDE 0.9% FLUSH
INTRAVENOUS | Status: AC
  Administered 2015-11-22: 10 mL via INTRAVENOUS
  Filled 2015-11-22: qty 10

## 2015-11-22 MED ORDER — TECHNETIUM TC 99M SESTAMIBI - CARDIOLITE
30.0000 | Freq: Once | INTRAVENOUS | Status: AC | PRN
  Administered 2015-11-22: 29 via INTRAVENOUS

## 2015-11-29 ENCOUNTER — Ambulatory Visit (INDEPENDENT_AMBULATORY_CARE_PROVIDER_SITE_OTHER): Payer: BLUE CROSS/BLUE SHIELD | Admitting: Cardiovascular Disease

## 2015-11-29 ENCOUNTER — Encounter: Payer: Self-pay | Admitting: Cardiovascular Disease

## 2015-11-29 VITALS — BP 122/72 | HR 85 | Ht 66.0 in | Wt 151.0 lb

## 2015-11-29 DIAGNOSIS — R0602 Shortness of breath: Secondary | ICD-10-CM | POA: Diagnosis not present

## 2015-11-29 DIAGNOSIS — R9431 Abnormal electrocardiogram [ECG] [EKG]: Secondary | ICD-10-CM

## 2015-11-29 DIAGNOSIS — R072 Precordial pain: Secondary | ICD-10-CM

## 2015-11-29 DIAGNOSIS — Z8249 Family history of ischemic heart disease and other diseases of the circulatory system: Secondary | ICD-10-CM

## 2015-11-29 DIAGNOSIS — I10 Essential (primary) hypertension: Secondary | ICD-10-CM

## 2015-11-29 DIAGNOSIS — R9439 Abnormal result of other cardiovascular function study: Secondary | ICD-10-CM

## 2015-11-29 NOTE — Progress Notes (Signed)
Patient ID: [name], female   DOB: 1950-12-09, 65 y.o.   MRN: [id number]      SUBJECTIVE: The patient returns for follow-up after undergoing cardiovascular testing performed for the evaluation of chest pain and shortness of breath.  Nuclear stress test showed the following:   Abnormal ST segment depression of 1.5 mm in leads II, III, aVF, and V5 through V6 at peak heart rate. Chest pain was also reported. Hypertensive response noted. Moderate risk Duke treadmill score of -5.  Blood pressure demonstrated a hypertensive response to exercise.  Small, mild intensity, fixed apical anteroseptal defect that is most consistent with breast attenuation. No clear evidence of ischemia.  This is a low risk study based on perfusion imaging although ECG abnormalities suggest moderately increased risk based on Duke treadmill score. Could be "false positive" result. Would correlate clinically.  Nuclear stress EF: 78%.  Echocardiography showed vigorous left ventricular systolic function and normal regional wall motion, EF 65-70% , with grade 1 diastolic dysfunction.  She has only been on metoprolol 25 mg twice daily for 9 days. Symptoms have not markedly changed. She had several questions regarding her stress test.   Review of Systems: As per "subjective", otherwise negative.  Allergies  Allergen Reactions  . Advair Diskus [Fluticasone-Salmeterol] Anaphylaxis  . Codeine Nausea And Vomiting  . Hydrocodone-Acetaminophen Nausea And Vomiting  . Oxycodone-Acetaminophen Nausea And Vomiting  . Tetracyclines & Related Itching    Internal    Current Outpatient Prescriptions  Medication Sig Dispense Refill  . aspirin EC 81 MG tablet Take 81 mg by mouth daily.    . colesevelam (WELCHOL) 625 MG tablet Take 625 mg by mouth daily.     . enalapril (VASOTEC) 10 MG tablet Take 10 mg by mouth daily.    . Insulin Detemir (LEVEMIR FLEXTOUCH) 100 UNIT/ML Pen Increased to 44 units qhs 45 mL 0  . Lancets  (ONETOUCH ULTRASOFT) lancets Use as instructed 100 each 12  . levothyroxine (SYNTHROID, LEVOTHROID) 50 MCG tablet Take 50 mcg by mouth daily before breakfast.    . metFORMIN (GLUCOPHAGE) 1000 MG tablet Take 1 tablet (1,000 mg total) by mouth 2 (two) times daily with a meal. 180 tablet 1  . metoprolol tartrate (LOPRESSOR) 25 MG tablet Take 1 tablet (25 mg total) by mouth 2 (two) times daily. 60 tablet 11  . montelukast (SINGULAIR) 10 MG tablet Take 10 mg by mouth at bedtime.     . pantoprazole (PROTONIX) 40 MG tablet Take 40 mg by mouth daily.     No current facility-administered medications for this visit.    Past Medical History  Diagnosis Date  . Generalized headaches   . Weakness   . Herniated disc, cervical   . Hypertension     takes Enalapril dialy  . GERD (gastroesophageal reflux disease)     takes Omeprazole daily  . Hypothyroidism     takes Synthroid daily  . PONV (postoperative nausea and vomiting)   . Hyperlipidemia     takes Welchol daily  . Asthma     takes Singulair nightly  . TIA (transient ischemic attack)     diagnosed by EEG but never knew she had it  . Trigger finger   . Weakness     in both hands  . Arthritis   . Joint pain     hands and neck  . IBS (irritable bowel syndrome)   . History of kidney stones   . Nocturia   . Diabetes mellitus  takes levemir daily and Metformin daily  . Seizures ([place])     denies    Past Surgical History  Procedure Laterality Date  . Abdominal hysterectomy  1980  . Carpel tunnel Bilateral     left first then right  . Herniated disc   10/2000, 08/31/2008    C5-C6 then C6-C7  . Lithotripsy  11/12/2007    right side  . Eye surgery  11/18/2008    right - cataracts  . Eye surgery  06/01/2009    left - cataracts  . Tonsillectomy      as a child  . Appendectomy      done with hysterectomy  . Parathyroidectomy  07/02/2012    Procedure: PARATHYROIDECTOMY;  Surgeon: Earnstine Regal, MD;  Location: [facility];  Service: General;   Laterality: N/A;  . Parathyroid surgery      October 2013  . Knee anthroscopy bilateral Bilateral   . Cataract implants    . Back surgery  2007    lumbar fusion  . Esophagogastroduodenoscopy      hx of h pylori  . Colonoscopy    . Anterior cervical decomp/discectomy fusion N/A 10/18/2013    Procedure: Cervical three-four, Cervical four-five, Cervical seven-Thoracic one anterior cervical decompression with fusion plating and bonegraft;  Surgeon: Hosie Spangle, MD;  Location: [place] [facility];  Service: Neurosurgery;  Laterality: N/A;  Cervical three-four, Cervical four-five, Cervical seven-Thoracic one anterior cervical decompression with fusion plating and bonegraft    Social History   Social History  . Marital Status: Married    Spouse Name: N/A  . Number of Children: N/A  . Years of Education: N/A   Occupational History  . Not on file.   Social History Main Topics  . Smoking status: Former Smoker -- 1.00 packs/day for 40 years    Quit date: 07/19/2012  . Smokeless tobacco: Never Used     Comment: quit smoking in Oct 2013  . Alcohol Use: No  . Drug Use: No  . Sexual Activity: Not on file   Other Topics Concern  . Not on file   Social History Narrative     Filed Vitals:   11/29/15 1312  BP: 122/72  Pulse: 85  Height: 5\' 6"  (1.676 m)  Weight: 151 lb (68.493 kg)  SpO2: 98%    PHYSICAL EXAM General: NAD Neck: No JVD, no thyromegaly or thyroid nodule.  Lungs: Clear to auscultation bilaterally with normal respiratory effort. CV: Nondisplaced PMI. Regular rate and rhythm, normal S1/S2, no S3/S4, no murmur. + left-sided chest wall tenderness near 2nd IC space. No peripheral edema. No carotid bruit. Normal pedal pulses.  Abdomen: Soft, nontender, no hepatosplenomegaly, no distention.  Skin: Intact without lesions or rashes.  Neurologic: Alert and oriented x 3.  Psych: Normal affect. Extremities: No clubbing or cyanosis.  HEENT: Normal.   ECG: Most recent  ECG reviewed.      ASSESSMENT AND PLAN: 1. Chest pain and shortness of breath: While symptoms are concerning for ischemic heart disease, nuclear stress test was overall low risk. It is possible that paroxysms of chest pain are related to markedly elevated BP. On ASA. Likely also warrants statin therapy given concomitant h/o diabetes. I recently initiated beta blockers for anti-anginal properties with metoprolol 25 mg bid.  I have asked her to call me back in 3 weeks to tell me if her symptoms have improved. If they have not, I would increase to 50 mg twice daily.  2. Essential HTN: Controlled.  Monitor given recent addition of metoprolol.  Dispo: f/u 2 months.   Kate Sable, M.D., F.A.C.C.

## 2015-11-29 NOTE — Patient Instructions (Signed)
Your physician recommends that you continue on your current medications as directed. Please refer to the Current Medication list given to you today. Your physician recommends that you schedule a follow-up appointment in: 2 months. Please contact our office in 3 weeks to let your doctor know how metoprolol is working for your symptoms. You can call or email [place] this information.

## 2015-12-05 ENCOUNTER — Other Ambulatory Visit: Payer: Self-pay | Admitting: "Endocrinology

## 2015-12-08 ENCOUNTER — Ambulatory Visit: Payer: BLUE CROSS/BLUE SHIELD | Admitting: Cardiovascular Disease

## 2015-12-11 ENCOUNTER — Encounter: Payer: Self-pay | Admitting: Cardiovascular Disease

## 2015-12-19 ENCOUNTER — Telehealth: Payer: Self-pay | Admitting: *Deleted

## 2015-12-19 MED ORDER — METOPROLOL TARTRATE 50 MG PO TABS: 50.0000 mg | ORAL_TABLET | Freq: Two times a day (BID) | ORAL | Status: DC

## 2015-12-19 NOTE — Telephone Encounter (Signed)
[name] at 12/19/2015 8:55 AM      Patient called stating she didn't receive a call back about this message. Please advise.

## 2015-12-19 NOTE — Telephone Encounter (Signed)
Increase metoprolol to 50mg bid.

## 2015-12-19 NOTE — Telephone Encounter (Signed)
-----   Message from [place] to Herminio Commons, MD sent at 12/11/2015 6:57 PM -----      Dr. put me on the medicine metoprolol tartrate 25mg  after a stress test on Feb 22nd made my bp go extra high.Marland KitchenMarland KitchenMarland KitchenWanted me to wait a few weeks and then send a message about this issue.Marland KitchenMarland KitchenWhile I am not experiencing the same degree of chest pain or shortness of breath as before starting this medicine I am still having the problem when I exert myself .Marland KitchenExample would be walking my dogs.Marland KitchenMarland KitchenIMy bp is higher when I take it then by anywhere from 10 to 25 numbers...Marland Kitchenexp:::130 before...Marland KitchenMarland Kitchen154 after..... Doctor said he may want to slowly increase the strength of the medicine.Marland KitchenMarland KitchenI don't come back for a follow up for I believe they said 2 months.Marland KitchenMarland KitchenMarland KitchenPlease let me know if any change is warrented before my next appointment.Marland KitchenMarland KitchenThanks so much.Marland KitchenMarland KitchenDolly Allen

## 2015-12-19 NOTE — Telephone Encounter (Signed)
Patient called stating she didn't receive a call back about this message.  Please advise.

## 2015-12-19 NOTE — Telephone Encounter (Signed)
Patient notified.  New rx sent to [facility] today.

## 2015-12-24 ENCOUNTER — Other Ambulatory Visit: Payer: Self-pay | Admitting: "Endocrinology

## 2015-12-27 ENCOUNTER — Other Ambulatory Visit: Payer: Self-pay | Admitting: "Endocrinology

## 2016-01-04 ENCOUNTER — Other Ambulatory Visit: Payer: Self-pay

## 2016-01-04 DIAGNOSIS — E039 Hypothyroidism, unspecified: Secondary | ICD-10-CM

## 2016-01-04 DIAGNOSIS — E1165 Type 2 diabetes mellitus with hyperglycemia: Secondary | ICD-10-CM

## 2016-01-04 DIAGNOSIS — IMO0002 Reserved for concepts with insufficient information to code with codable children: Secondary | ICD-10-CM

## 2016-01-04 DIAGNOSIS — E1169 Type 2 diabetes mellitus with other specified complication: Principal | ICD-10-CM

## 2016-01-04 LAB — BASIC METABOLIC PANEL
BUN: 14 mg/dL (ref 7–25)
CO2: 25 mmol/L (ref 20–31)
CREATININE: 0.91 mg/dL (ref 0.50–0.99)
Calcium: 8.7 mg/dL (ref 8.6–10.4)
Chloride: 102 mmol/L (ref 98–110)
GLUCOSE: 116 mg/dL — AB (ref 65–99)
POTASSIUM: 5.1 mmol/L (ref 3.5–5.3)
SODIUM: 140 mmol/L (ref 135–146)

## 2016-01-04 LAB — T4, FREE: Free T4: 1.5 ng/dL (ref 0.8–1.8)

## 2016-01-04 LAB — TSH: TSH: 3.52 mIU/L

## 2016-01-05 LAB — HEMOGLOBIN A1C
Hgb A1c MFr Bld: 6.8 % — ABNORMAL HIGH (ref <5.7)
Mean Plasma Glucose: 148 mg/dL

## 2016-01-08 ENCOUNTER — Ambulatory Visit: Payer: Medicare Other | Admitting: "Endocrinology

## 2016-01-11 ENCOUNTER — Ambulatory Visit (INDEPENDENT_AMBULATORY_CARE_PROVIDER_SITE_OTHER): Payer: Medicare Other | Admitting: "Endocrinology

## 2016-01-11 ENCOUNTER — Encounter: Payer: Self-pay | Admitting: "Endocrinology

## 2016-01-11 VITALS — BP 149/69 | HR 73 | Ht 66.0 in | Wt 151.0 lb

## 2016-01-11 DIAGNOSIS — E785 Hyperlipidemia, unspecified: Secondary | ICD-10-CM | POA: Diagnosis not present

## 2016-01-11 DIAGNOSIS — E1165 Type 2 diabetes mellitus with hyperglycemia: Secondary | ICD-10-CM | POA: Diagnosis not present

## 2016-01-11 DIAGNOSIS — Z794 Long term (current) use of insulin: Secondary | ICD-10-CM

## 2016-01-11 DIAGNOSIS — E039 Hypothyroidism, unspecified: Secondary | ICD-10-CM | POA: Diagnosis not present

## 2016-01-11 DIAGNOSIS — I10 Essential (primary) hypertension: Secondary | ICD-10-CM

## 2016-01-11 DIAGNOSIS — IMO0001 Reserved for inherently not codable concepts without codable children: Secondary | ICD-10-CM

## 2016-01-11 NOTE — Progress Notes (Signed)
Subjective:    Patient ID: [name], female    DOB: Sep 30, 1951,    Past Medical History  Diagnosis Date  . Generalized headaches   . Weakness   . Herniated disc, cervical   . Hypertension     takes Enalapril dialy  . GERD (gastroesophageal reflux disease)     takes Omeprazole daily  . Hypothyroidism     takes Synthroid daily  . PONV (postoperative nausea and vomiting)   . Hyperlipidemia     takes Welchol daily  . Asthma     takes Singulair nightly  . TIA (transient ischemic attack)     diagnosed by EEG but never knew she had it  . Trigger finger   . Weakness     in both hands  . Arthritis   . Joint pain     hands and neck  . IBS (irritable bowel syndrome)   . History of kidney stones   . Nocturia   . Diabetes mellitus     takes levemir daily and Metformin daily  . Seizures ([place])     denies   Past Surgical History  Procedure Laterality Date  . Abdominal hysterectomy  1980  . Carpel tunnel Bilateral     left first then right  . Herniated disc   10/2000, 08/31/2008    C5-C6 then C6-C7  . Lithotripsy  11/12/2007    right side  . Eye surgery  11/18/2008    right - cataracts  . Eye surgery  06/01/2009    left - cataracts  . Tonsillectomy      as a child  . Appendectomy      done with hysterectomy  . Parathyroidectomy  07/02/2012    Procedure: PARATHYROIDECTOMY;  Surgeon: Earnstine Regal, MD;  Location: [facility];  Service: General;  Laterality: N/A;  . Parathyroid surgery      October 2013  . Knee anthroscopy bilateral Bilateral   . Cataract implants    . Back surgery  2007    lumbar fusion  . Esophagogastroduodenoscopy      hx of h pylori  . Colonoscopy    . Anterior cervical decomp/discectomy fusion N/A 10/18/2013    Procedure: Cervical three-four, Cervical four-five, Cervical seven-Thoracic one anterior cervical decompression with fusion plating and bonegraft;  Surgeon: Hosie Spangle, MD;  Location: [facility];  Service: Neurosurgery;  Laterality:  N/A;  Cervical three-four, Cervical four-five, Cervical seven-Thoracic one anterior cervical decompression with fusion plating and bonegraft   Social History   Social History  . Marital Status: Married    Spouse Name: N/A  . Number of Children: N/A  . Years of Education: N/A   Social History Main Topics  . Smoking status: Former Smoker -- 1.00 packs/day for 40 years    Quit date: 07/19/2012  . Smokeless tobacco: Never Used     Comment: quit smoking in Oct 2013  . Alcohol Use: No  . Drug Use: No  . Sexual Activity: Not Asked   Other Topics Concern  . None   Social History Narrative   Outpatient Encounter Prescriptions as of 01/11/2016  Medication Sig  . aspirin EC 81 MG tablet Take 81 mg by mouth daily.  . BD PEN NEEDLE NANO U/F 32G X 4 MM MISC USE EVERY DAY AS DIRECTED  . colesevelam (WELCHOL) 625 MG tablet Take 625 mg by mouth daily.   . enalapril (VASOTEC) 10 MG tablet Take 10 mg by mouth daily.  . Lancets (  ONETOUCH ULTRASOFT) lancets Use as instructed  . LEVEMIR FLEXTOUCH 100 UNIT/ML Pen INJECT 44 UNITS AT BEDTIME  . levothyroxine (SYNTHROID, LEVOTHROID) 50 MCG tablet Take 50 mcg by mouth daily before breakfast.  . metFORMIN (GLUCOPHAGE) 1000 MG tablet TAKE 1 TABLET (1,000 MG TOTAL) BY MOUTH 2 (TWO) TIMES DAILY WITH A MEAL.  . metoprolol tartrate (LOPRESSOR) 50 MG tablet Take 1 tablet (50 mg total) by mouth 2 (two) times daily.  . montelukast (SINGULAIR) 10 MG tablet Take 10 mg by mouth at bedtime.   . pantoprazole (PROTONIX) 40 MG tablet Take 40 mg by mouth daily.   No facility-administered encounter medications on file as of 01/11/2016.   ALLERGIES: Allergies  Allergen Reactions  . Advair Diskus [Fluticasone-Salmeterol] Anaphylaxis  . Codeine Nausea And Vomiting  . Hydrocodone-Acetaminophen Nausea And Vomiting  . Oxycodone-Acetaminophen Nausea And Vomiting  . Tetracyclines & Related Itching    Internal   VACCINATION STATUS: Immunization History  Administered  Date(s) Administered  . Tdap 07/12/2014    Diabetes She presents for her follow-up diabetic visit. She has type 2 diabetes mellitus. Onset time: Diagnosed approximately at age  43 years. Her disease course has been improving. Pertinent negatives for hypoglycemia include no confusion, headaches, pallor or seizures. Associated symptoms include fatigue. Pertinent negatives for diabetes include no chest pain, no polydipsia, no polyphagia and no polyuria. Symptoms are improving. There are no diabetic complications. Risk factors for coronary artery disease include tobacco exposure, hypertension and dyslipidemia. Current diabetic treatment includes insulin injections and oral agent (monotherapy). She is compliant with treatment most of the time. Her weight is stable. She is following a generally unhealthy diet. (She did not bring her meter nor log to review today.) An ACE inhibitor/angiotensin II receptor blocker is being taken.  Hyperlipidemia This is a chronic problem. The current episode started more than 1 year ago. The problem is controlled. Pertinent negatives include no chest pain, myalgias or shortness of breath. Current antihyperlipidemic treatment includes bile acid squestrants. Risk factors for coronary artery disease include dyslipidemia, diabetes mellitus, hypertension and a sedentary lifestyle.  Hypertension This is a chronic problem. The current episode started more than 1 year ago. The problem is controlled. Pertinent negatives include no chest pain, headaches, palpitations or shortness of breath. Past treatments include ACE inhibitors.     Review of Systems  Constitutional: Positive for fatigue. Negative for unexpected weight change.  HENT: Negative for trouble swallowing and voice change.   Eyes: Negative for visual disturbance.  Respiratory: Negative for cough, shortness of breath and wheezing.   Cardiovascular: Negative for chest pain, palpitations and leg swelling.  Gastrointestinal:  Negative for nausea, vomiting and diarrhea.  Endocrine: Negative for cold intolerance, heat intolerance, polydipsia, polyphagia and polyuria.  Musculoskeletal: Negative for myalgias and arthralgias.  Skin: Negative for color change, pallor, rash and wound.  Neurological: Negative for seizures and headaches.  Psychiatric/Behavioral: Negative for suicidal ideas and confusion.    Objective:    BP 149/69 mmHg  Pulse 73  Ht 5\' 6"  (1.676 m)  Wt 151 lb (68.493 kg)  BMI 24.38 kg/m2  SpO2 99%  Wt Readings from Last 3 Encounters:  01/11/16 151 lb (68.493 kg)  11/29/15 151 lb (68.493 kg)  11/20/15 151 lb (68.493 kg)    Physical Exam  Constitutional: She is oriented to person, place, and time. She appears well-developed.  HENT:  Head: Normocephalic and atraumatic.  Eyes: EOM are normal.  Neck: Normal range of motion. Neck supple. No tracheal deviation present. No  thyromegaly present.  Cardiovascular: Normal rate and regular rhythm.   Pulmonary/Chest: Effort normal and breath sounds normal.  Abdominal: Soft. Bowel sounds are normal. There is no tenderness. There is no guarding.  Musculoskeletal: Normal range of motion. She exhibits no edema.  Neurological: She is alert and oriented to person, place, and time. She has normal reflexes. No cranial nerve deficit. Coordination normal.  Skin: Skin is warm and dry. No rash noted. No erythema. No pallor.  Psychiatric: She has a normal mood and affect. Judgment normal.    Results for orders placed or performed in visit on [id number]  Basic Metabolic Panel  Result Value Ref Range   Sodium 140 135 - 146 mmol/L   Potassium 5.1 3.5 - 5.3 mmol/L   Chloride 102 98 - 110 mmol/L   CO2 25 20 - 31 mmol/L   Glucose, Bld 116 (H) 65 - 99 mg/dL   BUN 14 7 - 25 mg/dL   Creat 0.91 0.50 - 0.99 mg/dL   Calcium 8.7 8.6 - 10.4 mg/dL  Hemoglobin A1c  Result Value Ref Range   Hgb A1c MFr Bld 6.8 (H) <5.7 %   Mean Plasma Glucose 148 mg/dL  TSH  Result Value Ref  Range   TSH 3.52 mIU/L  T4, Free  Result Value Ref Range   Free T4 1.5 0.8 - 1.8 ng/dL   Complete Blood Count (Most recent): Lab Results  Component Value Date   WBC 8.6 10/13/2013   HGB 15.1* 10/13/2013   HCT 44.2 10/13/2013   MCV 85.0 10/13/2013   PLT 270 10/13/2013   Chemistry (most recent): Lab Results  Component Value Date   NA 140 01/04/2016   K 5.1 01/04/2016   CL 102 01/04/2016   CO2 25 01/04/2016   BUN 14 01/04/2016   CREATININE 0.91 01/04/2016   Diabetic Labs (most recent): Lab Results  Component Value Date   HGBA1C 6.8* 01/04/2016   HGBA1C 6.8* 09/29/2015    Assessment & Plan:   1. Uncontrolled type 2 diabetes mellitus without complication, with long-term current use of insulin ([place])   Patient came with target glucose profile, and  recent A1c of 6.8% overall improving from 11.5 %.  Glucose logs and insulin administration records pertaining to this visit,  to be scanned into patient's records.  Recent labs reviewed. -Patient remains at a high risk for more acute and chronic complications of diabetes which include CAD, CVA, CKD, retinopathy, and neuropathy. These are all discussed in detail with the patient. -I have re-counseled the patient on diet management and weight loss, by adopting a carbohydrate restricted / protein rich  Diet. Patient is advised to stick to a routine mealtimes to eat 3 meals  a day and avoid unnecessary snacks ( to snack only to correct hypoglycemia).  -Suggestion is made for patient to avoid simple carbohydrates   from their diet including Cakes , Desserts, Ice Cream,  Soda (  diet and regular) , Sweet Tea , Candies,  Chips, Cookies, Artificial Sweeteners,   and "Sugar-free" Products .  This will help patient to have stable blood glucose profile and potentially lose weight.  -The patient is provided with individualized DM education. -I have approached patient to continue on  intensive monitoring of blood glucose and  Therapy as follows,  and patient agrees.  - I will Lower her basal insulin Levemir  To 40 units QHS, associated with strict monitoring of glucose  every morning.   -Patient is encouraged to call clinic for blood  glucose levels less than 70 or above 300 mg /dl. - I will continue metformin 1000 mg by mouth twice a day, therapeutically suitable for patient..   - Patient specific target  for A1c; LDL, HDL, Triglycerides, and  Waist Circumference were discussed in detail.  2) BP/HTN: Controlled. Continue current medications including ACEI. 3) Lipids/HPL: Intolerant to statins .continue WelChol  4)  Weight/Diet: exercise, and carbohydrates information provided.  5) Chronic Care/Health Maintenance: -Patient on ACEI medications and encouraged to continue to follow up with Ophthalmology, Podiatrist at least yearly or according to recommendations, and advised to quit smoking. I have recommended yearly flu vaccine and pneumonia vaccination at least every 5 years; moderate intensity exercise for up to 150 minutes weekly; and  sleep for at least 7 hours a day.  6) hypothyroidism: She is clinically euthyroid. Her TFTs are consistent with appropriate replacement. I advised her to continue levothyroxine 50 g by mouth every a.m.   - We discussed about correct intake of levothyroxine, at fasting, with water, separated by at least 30 minutes from breakfast, and separated by more than 4 hours from calcium, iron, multivitamins, acid reflux medications (PPIs). -Patient is made aware of the fact that thyroid hormone replacement is needed for life, dose to be adjusted by periodic monitoring of thyroid function tests.   7) history of hyperparathyroidism: She is status post parathyroidectomy. Calcium is normal at 8.7.   Patient to bring meter and  blood glucose logs during their next visit. Follow up plan: Return in about 3 months (around 04/11/2016) for high blood pressure, diabetes, high cholesterol, underactive thyroid, follow up  with pre-visit labs, meter, and logs.  Glade Lloyd, MD Phone: [phone]  Fax: [phone]   01/11/2016, 11:26 AM

## 2016-01-15 ENCOUNTER — Other Ambulatory Visit: Payer: Self-pay | Admitting: "Endocrinology

## 2016-01-16 MED ORDER — LEVOTHYROXINE SODIUM 50 MCG PO TABS: 50.0000 ug | ORAL_TABLET | Freq: Every day | ORAL | Status: DC

## 2016-01-24 ENCOUNTER — Telehealth: Payer: Self-pay

## 2016-01-24 NOTE — Telephone Encounter (Signed)
She will continue on 50 g of levothyroxine until next visit.

## 2016-01-24 NOTE — Telephone Encounter (Signed)
Pt.notified

## 2016-01-24 NOTE — Telephone Encounter (Signed)
Pt states that she was supposed to get a prescription for Levothyroxine 80mcg  Instead of 35mcg? Which dosage should she be on? She states that she has had 34mcg at home and that is what she has been taking recently.

## 2016-01-30 ENCOUNTER — Ambulatory Visit (INDEPENDENT_AMBULATORY_CARE_PROVIDER_SITE_OTHER): Payer: BLUE CROSS/BLUE SHIELD | Admitting: Cardiovascular Disease

## 2016-01-30 ENCOUNTER — Encounter: Payer: Self-pay | Admitting: Cardiovascular Disease

## 2016-01-30 VITALS — BP 128/64 | HR 73 | Ht 66.0 in | Wt 151.0 lb

## 2016-01-30 DIAGNOSIS — R0602 Shortness of breath: Secondary | ICD-10-CM

## 2016-01-30 DIAGNOSIS — Z8249 Family history of ischemic heart disease and other diseases of the circulatory system: Secondary | ICD-10-CM | POA: Diagnosis not present

## 2016-01-30 DIAGNOSIS — R072 Precordial pain: Secondary | ICD-10-CM | POA: Diagnosis not present

## 2016-01-30 DIAGNOSIS — I10 Essential (primary) hypertension: Secondary | ICD-10-CM

## 2016-01-30 MED ORDER — METOPROLOL TARTRATE 50 MG PO TABS: 50.0000 mg | ORAL_TABLET | Freq: Two times a day (BID) | ORAL | Status: DC

## 2016-01-30 NOTE — Patient Instructions (Signed)
Continue all current medications. Your physician wants you to follow up in: 6 months.  You will receive a reminder letter in the mail one-two months in advance.  If you don't receive a letter, please call our office to schedule the follow up appointment   

## 2016-01-30 NOTE — Progress Notes (Signed)
Patient ID: [name], female   DOB: [birth year], 65 y.o.   MRN: [id number]      SUBJECTIVE: The patient returns follow-up of chest pain and shortness of breath. Since I increased metoprolol to 50 mg twice daily, she has had no further episodes of chest pain. She has been monitoring her blood pressure at home with activity and it has remained normal. She denies palpitations and leg swelling. She is very pleased.   Review of Systems: As per "subjective", otherwise negative.  Allergies  Allergen Reactions  . Advair Diskus [Fluticasone-Salmeterol] Anaphylaxis  . Codeine Nausea And Vomiting  . Hydrocodone-Acetaminophen Nausea And Vomiting  . Oxycodone-Acetaminophen Nausea And Vomiting  . Tetracyclines & Related Itching    Internal    Current Outpatient Prescriptions  Medication Sig Dispense Refill  . aspirin EC 81 MG tablet Take 81 mg by mouth daily.    . BD PEN NEEDLE NANO U/F 32G X 4 MM MISC USE EVERY DAY AS DIRECTED 100 each 3  . colesevelam (WELCHOL) 625 MG tablet Take 625 mg by mouth daily.     . enalapril (VASOTEC) 10 MG tablet Take 10 mg by mouth daily.    . Lancets (ONETOUCH ULTRASOFT) lancets Use as instructed 100 each 12  . LEVEMIR FLEXTOUCH 100 UNIT/ML Pen INJECT 44 UNITS AT BEDTIME 45 mL 0  . levothyroxine (SYNTHROID, LEVOTHROID) 50 MCG tablet Take 1 tablet (50 mcg total) by mouth daily before breakfast. 90 tablet 1  . metFORMIN (GLUCOPHAGE) 1000 MG tablet TAKE 1 TABLET (1,000 MG TOTAL) BY MOUTH 2 (TWO) TIMES DAILY WITH A MEAL. 180 tablet 1  . metoprolol tartrate (LOPRESSOR) 50 MG tablet Take 1 tablet (50 mg total) by mouth 2 (two) times daily. 60 tablet 6  . montelukast (SINGULAIR) 10 MG tablet Take 10 mg by mouth at bedtime.     . pantoprazole (PROTONIX) 40 MG tablet Take 40 mg by mouth daily.     No current facility-administered medications for this visit.    Past Medical History  Diagnosis Date  . Generalized headaches   . Weakness   . Herniated disc, cervical    . Hypertension     takes Enalapril dialy  . GERD (gastroesophageal reflux disease)     takes Omeprazole daily  . Hypothyroidism     takes Synthroid daily  . PONV (postoperative nausea and vomiting)   . Hyperlipidemia     takes Welchol daily  . Asthma     takes Singulair nightly  . TIA (transient ischemic attack)     diagnosed by EEG but never knew she had it  . Trigger finger   . Weakness     in both hands  . Arthritis   . Joint pain     hands and neck  . IBS (irritable bowel syndrome)   . History of kidney stones   . Nocturia   . Diabetes mellitus     takes levemir daily and Metformin daily  . Seizures ([place])     denies    Past Surgical History  Procedure Laterality Date  . Abdominal hysterectomy  1980  . Carpel tunnel Bilateral     left first then right  . Herniated disc   10/2000, 08/31/2008    C5-C6 then C6-C7  . Lithotripsy  11/12/2007    right side  . Eye surgery  11/18/2008    right - cataracts  . Eye surgery  06/01/2009    left - cataracts  . Tonsillectomy  as a child  . Appendectomy      done with hysterectomy  . Parathyroidectomy  07/02/2012    Procedure: PARATHYROIDECTOMY;  Surgeon: Earnstine Regal, MD;  Location: [facility];  Service: General;  Laterality: N/A;  . Parathyroid surgery      October 2013  . Knee anthroscopy bilateral Bilateral   . Cataract implants    . Back surgery  2007    lumbar fusion  . Esophagogastroduodenoscopy      hx of h pylori  . Colonoscopy    . Anterior cervical decomp/discectomy fusion N/A 10/18/2013    Procedure: Cervical three-four, Cervical four-five, Cervical seven-Thoracic one anterior cervical decompression with fusion plating and bonegraft;  Surgeon: Hosie Spangle, MD;  Location: [facility];  Service: Neurosurgery;  Laterality: N/A;  Cervical three-four, Cervical four-five, Cervical seven-Thoracic one anterior cervical decompression with fusion plating and bonegraft    Social History   Social History  .  Marital Status: Married    Spouse Name: N/A  . Number of Children: N/A  . Years of Education: N/A   Occupational History  . Not on file.   Social History Main Topics  . Smoking status: Former Smoker -- 1.00 packs/day for 40 years    Quit date: 07/19/2012  . Smokeless tobacco: Never Used     Comment: quit smoking in Oct 2013  . Alcohol Use: No  . Drug Use: No  . Sexual Activity: Not on file   Other Topics Concern  . Not on file   Social History Narrative     Filed Vitals:   01/30/16 1421  BP: 128/64  Pulse: 73  Height: 5\' 6"  (1.676 m)  Weight: 151 lb (68.493 kg)  SpO2: 98%    PHYSICAL EXAM General: NAD HEENT: Normal. Neck: No JVD, no thyromegaly. Lungs: Clear to auscultation bilaterally with normal respiratory effort. CV: Nondisplaced PMI.  Regular rate and rhythm, normal S1/S2, no S3/S4, no murmur. No pretibial or periankle edema.     Abdomen: Soft, nontender, no distention.  Neurologic: Alert and oriented.  Psych: Normal affect. Skin: Normal. Musculoskeletal: No gross deformities.  ECG: Most recent ECG reviewed.      ASSESSMENT AND PLAN: 1. Chest pain and shortness of breath: Symptomatically stable on metoprolol 50 mg BID. It is likely that paroxysms of chest pain were related to markedly elevated BP. On ASA. Likely also warrants statin therapy given concomitant h/o diabetes.  2. Essential HTN: Controlled. No changes.  Dispo: f/u 6 months.   Kate Sable, M.D., F.A.C.C.

## 2016-02-19 ENCOUNTER — Telehealth: Payer: Self-pay | Admitting: Cardiovascular Disease

## 2016-02-19 ENCOUNTER — Other Ambulatory Visit: Payer: Self-pay | Admitting: *Deleted

## 2016-02-19 MED ORDER — METOPROLOL TARTRATE 50 MG PO TABS: 75.0000 mg | ORAL_TABLET | Freq: Two times a day (BID) | ORAL | Status: DC

## 2016-02-19 NOTE — Telephone Encounter (Signed)
To Dr Koneswaran 

## 2016-02-19 NOTE — Telephone Encounter (Signed)
Pt aware of increase, Medication sent to pharmacy w/90 day supply as requested

## 2016-02-19 NOTE — Telephone Encounter (Signed)
Can increase to 75 mg bid.

## 2016-02-19 NOTE — Telephone Encounter (Signed)
Patient wants her metoprolol (LOPRESSOR) 50 MG tablet increased due to her symptoms coming back.  She did not fill last Rx.  Said that she has enough medication to last her through this week.

## 2016-02-29 ENCOUNTER — Other Ambulatory Visit: Payer: Self-pay | Admitting: "Endocrinology

## 2016-03-05 DIAGNOSIS — L821 Other seborrheic keratosis: Secondary | ICD-10-CM | POA: Diagnosis not present

## 2016-03-05 DIAGNOSIS — L820 Inflamed seborrheic keratosis: Secondary | ICD-10-CM | POA: Diagnosis not present

## 2016-03-10 ENCOUNTER — Other Ambulatory Visit: Payer: Self-pay | Admitting: "Endocrinology

## 2016-04-04 ENCOUNTER — Other Ambulatory Visit: Payer: Self-pay | Admitting: "Endocrinology

## 2016-04-04 LAB — TSH: TSH: 4.37 mIU/L

## 2016-04-04 LAB — HEMOGLOBIN A1C
HEMOGLOBIN A1C: 6.6 % — AB (ref <5.7)
MEAN PLASMA GLUCOSE: 143 mg/dL

## 2016-04-04 LAB — T4, FREE: FREE T4: 1.1 ng/dL (ref 0.8–1.8)

## 2016-04-05 ENCOUNTER — Emergency Department (HOSPITAL_COMMUNITY)
Admission: EM | Admit: 2016-04-05 | Discharge: 2016-04-05 | Disposition: A | Discharge status: Home / Self Care | Payer: BLUE CROSS/BLUE SHIELD | Attending: Emergency Medicine | Admitting: Emergency Medicine

## 2016-04-05 ENCOUNTER — Telehealth: Payer: Self-pay

## 2016-04-05 ENCOUNTER — Encounter (HOSPITAL_COMMUNITY): Payer: Self-pay | Admitting: Emergency Medicine

## 2016-04-05 ENCOUNTER — Emergency Department (HOSPITAL_COMMUNITY): Payer: BLUE CROSS/BLUE SHIELD

## 2016-04-05 DIAGNOSIS — Z7982 Long term (current) use of aspirin: Secondary | ICD-10-CM | POA: Insufficient documentation

## 2016-04-05 DIAGNOSIS — Z79899 Other long term (current) drug therapy: Secondary | ICD-10-CM | POA: Insufficient documentation

## 2016-04-05 DIAGNOSIS — M1990 Unspecified osteoarthritis, unspecified site: Secondary | ICD-10-CM | POA: Diagnosis not present

## 2016-04-05 DIAGNOSIS — I10 Essential (primary) hypertension: Secondary | ICD-10-CM | POA: Diagnosis not present

## 2016-04-05 DIAGNOSIS — E039 Hypothyroidism, unspecified: Secondary | ICD-10-CM | POA: Insufficient documentation

## 2016-04-05 DIAGNOSIS — N200 Calculus of kidney: Secondary | ICD-10-CM | POA: Insufficient documentation

## 2016-04-05 DIAGNOSIS — J45909 Unspecified asthma, uncomplicated: Secondary | ICD-10-CM | POA: Diagnosis not present

## 2016-04-05 DIAGNOSIS — Z8673 Personal history of transient ischemic attack (TIA), and cerebral infarction without residual deficits: Secondary | ICD-10-CM | POA: Diagnosis not present

## 2016-04-05 DIAGNOSIS — Z87891 Personal history of nicotine dependence: Secondary | ICD-10-CM | POA: Insufficient documentation

## 2016-04-05 DIAGNOSIS — R7989 Other specified abnormal findings of blood chemistry: Secondary | ICD-10-CM | POA: Diagnosis present

## 2016-04-05 LAB — BASIC METABOLIC PANEL
ANION GAP: 7 (ref 5–15)
BUN: 15 mg/dL (ref 6–20)
CO2: 24 mmol/L (ref 22–32)
Calcium: 8.9 mg/dL (ref 8.9–10.3)
Chloride: 106 mmol/L (ref 101–111)
Creatinine, Ser: 0.85 mg/dL (ref 0.44–1.00)
GFR calc Af Amer: >60 mL/min (ref >60)
Glucose, Bld: 147 mg/dL — ABNORMAL HIGH (ref 65–99)
POTASSIUM: 4.2 mmol/L (ref 3.5–5.1)
SODIUM: 137 mmol/L (ref 135–145)

## 2016-04-05 LAB — CBC WITH DIFFERENTIAL/PLATELET
BASOS PCT: 1 %
Basophils Absolute: 0.1 10*3/uL (ref 0.0–0.1)
EOS ABS: 0.4 10*3/uL (ref 0.0–0.7)
Eosinophils Relative: 5 %
HEMATOCRIT: 39.1 % (ref 36.0–46.0)
HEMOGLOBIN: 12.4 g/dL (ref 12.0–15.0)
LYMPHS PCT: 22 %
Lymphs Abs: 1.6 10*3/uL (ref 0.7–4.0)
MCH: 25.3 pg — AB (ref 26.0–34.0)
MCHC: 31.7 g/dL (ref 30.0–36.0)
MCV: 79.6 fL (ref 78.0–100.0)
Monocytes Absolute: 0.6 10*3/uL (ref 0.1–1.0)
Monocytes Relative: 9 %
NEUTROS ABS: 4.7 10*3/uL (ref 1.7–7.7)
Neutrophils Relative %: 63 %
Platelets: 233 10*3/uL (ref 150–400)
RBC: 4.91 MIL/uL (ref 3.87–5.11)
RDW: 15.2 % (ref 11.5–15.5)
WBC: 7.4 10*3/uL (ref 4.0–10.5)

## 2016-04-05 LAB — URINALYSIS, ROUTINE W REFLEX MICROSCOPIC
Bilirubin Urine: NEGATIVE
Glucose, UA: NEGATIVE mg/dL
Ketones, ur: NEGATIVE mg/dL
LEUKOCYTES UA: NEGATIVE
NITRITE: NEGATIVE
PROTEIN: NEGATIVE mg/dL
Specific Gravity, Urine: <1.005 — ABNORMAL LOW (ref 1.005–1.030)
pH: 6 (ref 5.0–8.0)

## 2016-04-05 LAB — URINE MICROSCOPIC-ADD ON

## 2016-04-05 MED ORDER — TAMSULOSIN HCL 0.4 MG PO CAPS: 0.4000 mg | ORAL_CAPSULE | Freq: Every day | ORAL | Status: DC

## 2016-04-05 MED ORDER — ONDANSETRON HCL 4 MG PO TABS: 4.0000 mg | ORAL_TABLET | Freq: Three times a day (TID) | ORAL | Status: DC | PRN

## 2016-04-05 MED ORDER — TRAMADOL HCL 50 MG PO TABS: 50.0000 mg | ORAL_TABLET | Freq: Four times a day (QID) | ORAL | Status: DC | PRN

## 2016-04-05 MED ORDER — TAMSULOSIN HCL 0.4 MG PO CAPS
0.4000 mg | ORAL_CAPSULE | Freq: Once | ORAL | Status: AC
  Administered 2016-04-05: 0.4 mg via ORAL
  Filled 2016-04-05: qty 1

## 2016-04-05 MED ORDER — KETOROLAC TROMETHAMINE 30 MG/ML IJ SOLN
15.0000 mg | Freq: Once | INTRAMUSCULAR | Status: AC
  Administered 2016-04-05: 15 mg via INTRAVENOUS
  Filled 2016-04-05: qty 1

## 2016-04-05 NOTE — Telephone Encounter (Signed)
Contacted pt due to labs showing acute renal failure. Notified pt to go to the ER as Dr Dorris Fetch has instructed and to stop the metformin. Pt agrees.

## 2016-04-05 NOTE — ED Notes (Signed)
Pt went to pcp yesterday for lab, pt has been have dark urine and some pain, thought might have a kidney stone.  Dr Dorris Fetch called today to tell pt to come to ED for acute renal failure.

## 2016-04-05 NOTE — ED Provider Notes (Signed)
CSN: [id number]     Arrival date & time 04/05/16  1421 History   First MD Initiated Contact with Patient 04/05/16 1559     Chief Complaint  Patient presents with  . Abnormal Lab     (Consider location/radiation/quality/duration/timing/severity/associated sxs/prior Treatment) HPI  Patient was having routine lab work done and found to have a creatinine of greater than 4 sustained here for further evaluation. Patient states that she has had some right-sided flank pain, hematuria over the last couple days however no other symptoms. She has not had any decreased urine output, dry mouth, dehydration, recent illnesses, or pain elsewhere. No history of renal problems. Has not seen anyone or tried anything for this problem.  Past Medical History  Diagnosis Date  . Generalized headaches   . Weakness   . Herniated disc, cervical   . Hypertension     takes Enalapril dialy  . GERD (gastroesophageal reflux disease)     takes Omeprazole daily  . Hypothyroidism     takes Synthroid daily  . PONV (postoperative nausea and vomiting)   . Hyperlipidemia     takes Welchol daily  . Asthma     takes Singulair nightly  . TIA (transient ischemic attack)     diagnosed by EEG but never knew she had it  . Trigger finger   . Weakness     in both hands  . Arthritis   . Joint pain     hands and neck  . IBS (irritable bowel syndrome)   . History of kidney stones   . Nocturia   . Diabetes mellitus     takes levemir daily and Metformin daily  . Seizures ([place])     denies   Past Surgical History  Procedure Laterality Date  . Abdominal hysterectomy  1980  . Carpel tunnel Bilateral     left first then right  . Herniated disc   10/2000, 08/31/2008    C5-C6 then C6-C7  . Lithotripsy  11/12/2007    right side  . Eye surgery  11/18/2008    right - cataracts  . Eye surgery  06/01/2009    left - cataracts  . Tonsillectomy      as a child  . Appendectomy      done with hysterectomy  . Parathyroidectomy   07/02/2012    Procedure: PARATHYROIDECTOMY;  Surgeon: Earnstine Regal, MD;  Location: [facility];  Service: General;  Laterality: N/A;  . Parathyroid surgery      October 2013  . Knee anthroscopy bilateral Bilateral   . Cataract implants    . Back surgery  2007    lumbar fusion  . Esophagogastroduodenoscopy      hx of h pylori  . Colonoscopy    . Anterior cervical decomp/discectomy fusion N/A 10/18/2013    Procedure: Cervical three-four, Cervical four-five, Cervical seven-Thoracic one anterior cervical decompression with fusion plating and bonegraft;  Surgeon: Hosie Spangle, MD;  Location: [facility];  Service: Neurosurgery;  Laterality: N/A;  Cervical three-four, Cervical four-five, Cervical seven-Thoracic one anterior cervical decompression with fusion plating and bonegraft   Family History  Problem Relation Age of Onset  . Cancer Mother     pancreatic   Social History  Substance Use Topics  . Smoking status: Former Smoker -- 1.00 packs/day for 40 years    Quit date: 07/19/2012  . Smokeless tobacco: Never Used     Comment: quit smoking in Oct 2013  . Alcohol Use: No   OB  History    No data available     Review of Systems  All other systems reviewed and are negative.     Allergies  Advair diskus; Codeine; Hydrocodone-acetaminophen; Oxycodone-acetaminophen; and Tetracyclines & related  Home Medications   Prior to Admission medications   Medication Sig Start Date End Date Taking? Authorizing Provider  Aspirin-Salicylamide-Caffeine (BC HEADACHE) 325-95-16 MG TABS Take 0.5 packets by mouth daily as needed (FOR PAIN).   Yes Historical Provider, MD  BD PEN NEEDLE NANO U/F 32G X 4 MM MISC USE EVERY DAY AS DIRECTED 12/28/15  Yes Cassandria Anger, MD  colesevelam [facility]) 625 MG tablet Take 625 mg by mouth daily.    Yes Historical Provider, MD  enalapril (VASOTEC) 10 MG tablet Take 10 mg by mouth daily.   Yes Historical Provider, MD  Lancets [facility] ULTRASOFT) lancets Use  as instructed 10/06/15  Yes Cassandria Anger, MD  LEVEMIR FLEXTOUCH 100 UNIT/ML Pen INJECT 44 UNITS AT BEDTIME Patient taking differently: INJECT 40 UNITS AT BEDTIME 03/11/16  Yes Cassandria Anger, MD  levothyroxine (SYNTHROID, LEVOTHROID) 50 MCG tablet Take 1 tablet (50 mcg total) by mouth daily before breakfast. 01/16/16  Yes Cassandria Anger, MD  metoprolol (LOPRESSOR) 50 MG tablet Take 1.5 tablets (75 mg total) by mouth 2 (two) times daily. 02/19/16  Yes Herminio Commons, MD  montelukast (SINGULAIR) 10 MG tablet Take 10 mg by mouth at bedtime.  06/03/12  Yes Historical Provider, MD  ONE TOUCH ULTRA TEST test strip TEST TWICE A DAY AS DIRECTED 02/29/16  Yes Cassandria Anger, MD  pantoprazole (PROTONIX) 40 MG tablet Take 40 mg by mouth 2 (two) times daily.    Yes Historical Provider, MD  ondansetron (ZOFRAN) 4 MG tablet Take 1 tablet (4 mg total) by mouth every 8 (eight) hours as needed for nausea or vomiting. 04/05/16   Merrily Pew, MD  tamsulosin (FLOMAX) 0.4 MG CAPS capsule Take 1 capsule (0.4 mg total) by mouth daily. 04/05/16   Merrily Pew, MD  traMADol (ULTRAM) 50 MG tablet Take 1 tablet (50 mg total) by mouth every 6 (six) hours as needed. 04/05/16   Corene Cornea Dymon [place], MD   BP 152/80 mmHg  Pulse 80  Temp(Src) 97.9 F (36.6 C) (Oral)  Resp 17  Ht 5\' 6"  (1.676 m)  Wt 152 lb (68.947 kg)  BMI 24.55 kg/m2  SpO2 100% Physical Exam  Constitutional: She is oriented to person, place, and time. She appears well-developed and well-nourished.  HENT:  Head: Normocephalic and atraumatic.  Neck: Normal range of motion.  Cardiovascular: Normal rate and regular rhythm.   Pulmonary/Chest: No stridor. No respiratory distress.  Abdominal: She exhibits no distension.  Neurological: She is alert and oriented to person, place, and time.  Skin: Skin is warm and dry.  Nursing note and vitals reviewed.   ED Course  Procedures (including critical care time) Labs Review Labs Reviewed  URINALYSIS,  ROUTINE W REFLEX MICROSCOPIC (NOT AT [facility]) - Abnormal; Notable for the following:    Specific Gravity, Urine <1.005 (*)    Hgb urine dipstick LARGE (*)    All other components within normal limits  BASIC METABOLIC PANEL - Abnormal; Notable for the following:    Glucose, Bld 147 (*)    All other components within normal limits  CBC WITH DIFFERENTIAL/PLATELET - Abnormal; Notable for the following:    MCH 25.3 (*)    All other components within normal limits  URINE MICROSCOPIC-ADD ON - Abnormal; Notable for the  following:    Squamous Epithelial / LPF 0-5 (*)    Bacteria, UA RARE (*)    All other components within normal limits    Imaging Review Ct Renal Stone Study  04/05/2016  CLINICAL DATA:  Dark hearing and some pain question kidney stone, history of kidney stones, hypertension, diabetes mellitus, GERD, irritable bowel syndrome, hysterectomy, appendectomy, former smoker, asthma EXAM: CT ABDOMEN AND PELVIS WITHOUT CONTRAST TECHNIQUE: Multidetector CT imaging of the abdomen and pelvis was performed following the standard protocol without IV contrast. Sagittal and coronal MPR images reconstructed from axial data set. Oral contrast not administered for this indication COMPARISON:  07/27/2011 FINDINGS: Lower chest: Minimal dependent bibasilar atelectasis RIGHT greater than LEFT Hepatobiliary: Vague hyperdensity within gallbladder question tiny gallstone. Gallbladder contracted. Liver unremarkable. Pancreas: Normal appearance Spleen: Normal appearance Adrenals/Urinary Tract: BILATERAL nonobstructing renal calculi. Minimal fullness of central renal collecting systems. Dilatation of proximal LEFT ureter terminating at a 5 mm diameter mid to distal LEFT ureteral calculus image 65 new since prior study. Remaining ureters and bladder unremarkable. No definite renal mass. Adrenal glands unremarkable. Stomach/Bowel: Appendix surgically absent by history. Stomach and bowel loops unremarkable. Vascular/Lymphatic:  Atherosclerotic calcifications without aneurysm. Normal size retroperitoneal nodes. No abdominal or pelvic adenopathy. Reproductive: Uterus surgically absent. Normal appearing LEFT ovary. RIGHT ovary not visualized. Other: No free air or free fluid. Subcutaneous infiltration at the anterior abdominal wall bilaterally with minimal associated skin thickening, question contusion versus cellulitis versus medication injection sites. Musculoskeletal: No acute osseous findings. IMPRESSION: BILATERAL nonobstructing renal calculi. Mild LEFT ureteral dilatation secondary to a 5 mm mid to distal LEFT ureteral calculus. Question cholelithiasis. Skin thickening at the anterior abdominal wall bilaterally which could reflect cellulitis, medication injection sites, or contusion. Electronically Signed   By: Lavonia Dana M.D.   On: 04/05/2016 18:00   I have personally reviewed and evaluated these images and lab results as part of my medical decision-making.   EKG Interpretation   Date/Time:  Friday April 05 2016 16:51:22 EDT Ventricular Rate:  80 PR Interval:    QRS Duration: 71 QT Interval:  395 QTC Calculation: 456 R Axis:   62 Text Interpretation:  Sinus rhythm Borderline repolarization abnormality  No STEMI.  Confirmed by LONG MD, JOSHUA [phone]) on 04/05/2016 5:07:58 PM      MDM   Final diagnoses:  Kidney stone   Patient of a creatinine on previous labs was likely a lab error, as it is normal here.Marland Kitchen However with hematuria and flank pain at his CT scan to evaluate for kidney stone she has a left-sided ureterolithiasis with mild hydronephrosis. She has a history of kidney stones so this is no surprise to her. She will strain her urine however follow-up with urology if she the stone doesn;t passin about a week.  New Prescriptions: Discharge Medication List as of 04/05/2016  6:38 PM    START taking these medications   Details  ondansetron (ZOFRAN) 4 MG tablet Take 1 tablet (4 mg total) by mouth every 8  (eight) hours as needed for nausea or vomiting., Starting 04/05/2016, Until Discontinued, Print    tamsulosin (FLOMAX) 0.4 MG CAPS capsule Take 1 capsule (0.4 mg total) by mouth daily., Starting 04/05/2016, Until Discontinued, Print    traMADol (ULTRAM) 50 MG tablet Take 1 tablet (50 mg total) by mouth every 6 (six) hours as needed., Starting 04/05/2016, Until Discontinued, Print         I have personally and contemperaneously reviewed labs and imaging and used in my  decision making as above.   A medical screening exam was performed and I feel the patient has had an appropriate workup for their chief complaint at this time and likelihood of emergent condition existing is low and thus workup can continue on an outpatient basis.. Their vital signs are stable. They have been counseled on decision, discharge, follow up and which symptoms necessitate immediate return to the emergency department.  They verbally stated understanding and agreement with plan and discharged in stable condition.      Merrily Pew, MD 04/05/16 [phone]

## 2016-04-08 ENCOUNTER — Telehealth: Payer: Self-pay

## 2016-04-08 LAB — COMPLETE METABOLIC PANEL WITH GFR
ALBUMIN: 3.8 g/dL (ref 3.6–5.1)
ALK PHOS: 54 U/L (ref 33–130)
ALT: 31 U/L — AB (ref 6–29)
AST: 23 U/L (ref 10–35)
BILIRUBIN TOTAL: 0.4 mg/dL (ref 0.2–1.2)
BUN: 15 mg/dL (ref 7–25)
CO2: 27 mmol/L (ref 20–31)
Calcium: 8.9 mg/dL (ref 8.6–10.4)
Chloride: 104 mmol/L (ref 98–110)
Glucose, Bld: 129 mg/dL — ABNORMAL HIGH (ref 65–99)
Potassium: 4.7 mmol/L (ref 3.5–5.3)
Sodium: 141 mmol/L (ref 135–146)
TOTAL PROTEIN: 5.9 g/dL — AB (ref 6.1–8.1)

## 2016-04-08 LAB — LIPID PANEL
CHOLESTEROL: 166 mg/dL (ref 125–200)
HDL: 27 mg/dL — ABNORMAL LOW (ref >=46)
LDL CALC: 99 mg/dL (ref <130)
TRIGLYCERIDES: 198 mg/dL — AB (ref <150)
Total CHOL/HDL Ratio: 6.1 Ratio — ABNORMAL HIGH (ref <=5.0)
VLDL: 40 mg/dL — ABNORMAL HIGH (ref <30)

## 2016-04-08 NOTE — Telephone Encounter (Signed)
Pt notified by voicemail.

## 2016-04-08 NOTE — Telephone Encounter (Signed)
Pt called questioning if she needs to restart the Metformin?

## 2016-04-08 NOTE — Telephone Encounter (Signed)
Yes, she can restart metformin 500mg  po BID.

## 2016-04-11 ENCOUNTER — Ambulatory Visit (INDEPENDENT_AMBULATORY_CARE_PROVIDER_SITE_OTHER): Payer: BLUE CROSS/BLUE SHIELD | Admitting: "Endocrinology

## 2016-04-11 VITALS — BP 138/76 | HR 85 | Ht 66.0 in | Wt 155.0 lb

## 2016-04-11 DIAGNOSIS — E1165 Type 2 diabetes mellitus with hyperglycemia: Secondary | ICD-10-CM

## 2016-04-11 DIAGNOSIS — Z794 Long term (current) use of insulin: Secondary | ICD-10-CM

## 2016-04-11 DIAGNOSIS — E039 Hypothyroidism, unspecified: Secondary | ICD-10-CM | POA: Diagnosis not present

## 2016-04-11 DIAGNOSIS — IMO0001 Reserved for inherently not codable concepts without codable children: Secondary | ICD-10-CM

## 2016-04-11 DIAGNOSIS — I10 Essential (primary) hypertension: Secondary | ICD-10-CM

## 2016-04-11 DIAGNOSIS — E785 Hyperlipidemia, unspecified: Secondary | ICD-10-CM | POA: Diagnosis not present

## 2016-04-11 NOTE — Progress Notes (Signed)
Subjective:    Patient ID: [name], female    DOB: [birth year],    Past Medical History  Diagnosis Date  . Generalized headaches   . Weakness   . Herniated disc, cervical   . Hypertension     takes Enalapril dialy  . GERD (gastroesophageal reflux disease)     takes Omeprazole daily  . Hypothyroidism     takes Synthroid daily  . PONV (postoperative nausea and vomiting)   . Hyperlipidemia     takes Welchol daily  . Asthma     takes Singulair nightly  . TIA (transient ischemic attack)     diagnosed by EEG but never knew she had it  . Trigger finger   . Weakness     in both hands  . Arthritis   . Joint pain     hands and neck  . IBS (irritable bowel syndrome)   . History of kidney stones   . Nocturia   . Diabetes mellitus     takes levemir daily and Metformin daily  . Seizures ([place])     denies   Past Surgical History  Procedure Laterality Date  . Abdominal hysterectomy  1980  . Carpel tunnel Bilateral     left first then right  . Herniated disc   10/2000, 08/31/2008    C5-C6 then C6-C7  . Lithotripsy  11/12/2007    right side  . Eye surgery  11/18/2008    right - cataracts  . Eye surgery  06/01/2009    left - cataracts  . Tonsillectomy      as a child  . Appendectomy      done with hysterectomy  . Parathyroidectomy  07/02/2012    Procedure: PARATHYROIDECTOMY;  Surgeon: Earnstine Regal, MD;  Location: [facility];  Service: General;  Laterality: N/A;  . Parathyroid surgery      October 2013  . Knee anthroscopy bilateral Bilateral   . Cataract implants    . Back surgery  2007    lumbar fusion  . Esophagogastroduodenoscopy      hx of h pylori  . Colonoscopy    . Anterior cervical decomp/discectomy fusion N/A 10/18/2013    Procedure: Cervical three-four, Cervical four-five, Cervical seven-Thoracic one anterior cervical decompression with fusion plating and bonegraft;  Surgeon: Hosie Spangle, MD;  Location: [facility];  Service: Neurosurgery;  Laterality:  N/A;  Cervical three-four, Cervical four-five, Cervical seven-Thoracic one anterior cervical decompression with fusion plating and bonegraft   Social History   Social History  . Marital Status: Married    Spouse Name: N/A  . Number of Children: N/A  . Years of Education: N/A   Social History Main Topics  . Smoking status: Former Smoker -- 1.00 packs/day for 40 years    Quit date: 07/19/2012  . Smokeless tobacco: Never Used     Comment: quit smoking in Oct 2013  . Alcohol Use: No  . Drug Use: No  . Sexual Activity: Not on file   Other Topics Concern  . Not on file   Social History Narrative   Outpatient Encounter Prescriptions as of 04/11/2016  Medication Sig  . metFORMIN (GLUCOPHAGE) 500 MG tablet Take 500 mg by mouth 2 (two) times daily with a meal.  . Aspirin-Salicylamide-Caffeine (BC HEADACHE) 325-95-16 MG TABS Take 0.5 packets by mouth daily as needed (FOR PAIN).  . BD PEN NEEDLE NANO U/F 32G X 4 MM MISC USE EVERY DAY AS DIRECTED  . colesevelam (  WELCHOL) 625 MG tablet Take 625 mg by mouth daily.   . enalapril (VASOTEC) 10 MG tablet Take 10 mg by mouth daily.  . Lancets (ONETOUCH ULTRASOFT) lancets Use as instructed  . LEVEMIR FLEXTOUCH 100 UNIT/ML Pen INJECT 44 UNITS AT BEDTIME (Patient taking differently: INJECT 40 UNITS AT BEDTIME)  . levothyroxine (SYNTHROID, LEVOTHROID) 50 MCG tablet Take 1 tablet (50 mcg total) by mouth daily before breakfast.  . metoprolol (LOPRESSOR) 50 MG tablet Take 1.5 tablets (75 mg total) by mouth 2 (two) times daily.  . montelukast (SINGULAIR) 10 MG tablet Take 10 mg by mouth at bedtime.   . ondansetron (ZOFRAN) 4 MG tablet Take 1 tablet (4 mg total) by mouth every 8 (eight) hours as needed for nausea or vomiting.  . ONE TOUCH ULTRA TEST test strip TEST TWICE A DAY AS DIRECTED  . pantoprazole (PROTONIX) 40 MG tablet Take 40 mg by mouth 2 (two) times daily.   . tamsulosin (FLOMAX) 0.4 MG CAPS capsule Take 1 capsule (0.4 mg total) by mouth daily.   . traMADol (ULTRAM) 50 MG tablet Take 1 tablet (50 mg total) by mouth every 6 (six) hours as needed.   No facility-administered encounter medications on file as of 04/11/2016.   ALLERGIES: Allergies  Allergen Reactions  . Advair Diskus [Fluticasone-Salmeterol] Anaphylaxis  . Codeine Nausea And Vomiting  . Hydrocodone-Acetaminophen Nausea And Vomiting  . Oxycodone-Acetaminophen Nausea And Vomiting  . Tetracyclines & Related Itching    Internal   VACCINATION STATUS: Immunization History  Administered Date(s) Administered  . Tdap 07/12/2014    Diabetes She presents for her follow-up diabetic visit. She has type 2 diabetes mellitus. Onset time: Diagnosed approximately at age  2 years. Her disease course has been improving. Pertinent negatives for hypoglycemia include no confusion, headaches, pallor or seizures. Associated symptoms include fatigue. Pertinent negatives for diabetes include no chest pain, no polydipsia, no polyphagia and no polyuria. Symptoms are improving. There are no diabetic complications. Risk factors for coronary artery disease include tobacco exposure, hypertension and dyslipidemia. Current diabetic treatment includes insulin injections and oral agent (monotherapy). She is compliant with treatment most of the time. Her weight is stable. She is following a generally unhealthy diet. Her breakfast blood glucose range is generally 130-140 mg/dl. An ACE inhibitor/angiotensin II receptor blocker is being taken.  Hyperlipidemia This is a chronic problem. The current episode started more than 1 year ago. The problem is controlled. Pertinent negatives include no chest pain, myalgias or shortness of breath. Current antihyperlipidemic treatment includes bile acid squestrants. Risk factors for coronary artery disease include dyslipidemia, diabetes mellitus, hypertension and a sedentary lifestyle.  Hypertension This is a chronic problem. The current episode started more than 1 year  ago. The problem is controlled. Pertinent negatives include no chest pain, headaches, palpitations or shortness of breath. Past treatments include ACE inhibitors.     Review of Systems  Constitutional: Positive for fatigue. Negative for unexpected weight change.  HENT: Negative for trouble swallowing and voice change.   Eyes: Negative for visual disturbance.  Respiratory: Negative for cough, shortness of breath and wheezing.   Cardiovascular: Negative for chest pain, palpitations and leg swelling.  Gastrointestinal: Negative for nausea, vomiting and diarrhea.  Endocrine: Negative for cold intolerance, heat intolerance, polydipsia, polyphagia and polyuria.  Musculoskeletal: Negative for myalgias and arthralgias.  Skin: Negative for color change, pallor, rash and wound.  Neurological: Negative for seizures and headaches.  Psychiatric/Behavioral: Negative for suicidal ideas and confusion.    Objective:  BP 138/76 mmHg  Pulse 85  Ht 5\' 6"  (1.676 m)  Wt 155 lb (70.308 kg)  BMI 25.03 kg/m2  Wt Readings from Last 3 Encounters:  04/11/16 155 lb (70.308 kg)  04/05/16 152 lb (68.947 kg)  01/30/16 151 lb (68.493 kg)    Physical Exam  Constitutional: She is oriented to person, place, and time. She appears well-developed.  HENT:  Head: Normocephalic and atraumatic.  Eyes: EOM are normal.  Neck: Normal range of motion. Neck supple. No tracheal deviation present. No thyromegaly present.  Cardiovascular: Normal rate and regular rhythm.   Pulmonary/Chest: Effort normal and breath sounds normal.  Abdominal: Soft. Bowel sounds are normal. There is no tenderness. There is no guarding.  Musculoskeletal: Normal range of motion. She exhibits no edema.  Neurological: She is alert and oriented to person, place, and time. She has normal reflexes. No cranial nerve deficit. Coordination normal.  Skin: Skin is warm and dry. No rash noted. No erythema. No pallor.  Psychiatric: She has a normal mood and  affect. Judgment normal.    Results for orders placed or performed during the hospital encounter of 04/05/16  Urinalysis, Routine w reflex microscopic- may I&O cath if menses  Result Value Ref Range   Color, Urine YELLOW YELLOW   APPearance CLEAR CLEAR   Specific Gravity, Urine <1.005 (L) 1.005 - 1.030   pH 6.0 5.0 - 8.0   Glucose, UA NEGATIVE NEGATIVE mg/dL   Hgb urine dipstick LARGE (A) NEGATIVE   Bilirubin Urine NEGATIVE NEGATIVE   Ketones, ur NEGATIVE NEGATIVE mg/dL   Protein, ur NEGATIVE NEGATIVE mg/dL   Nitrite NEGATIVE NEGATIVE   Leukocytes, UA NEGATIVE NEGATIVE  Basic metabolic panel  Result Value Ref Range   Sodium 137 135 - 145 mmol/L   Potassium 4.2 3.5 - 5.1 mmol/L   Chloride 106 101 - 111 mmol/L   CO2 24 22 - 32 mmol/L   Glucose, Bld 147 (H) 65 - 99 mg/dL   BUN 15 6 - 20 mg/dL   Creatinine, Ser 0.85 0.44 - 1.00 mg/dL   Calcium 8.9 8.9 - 10.3 mg/dL   GFR calc non Af Amer >60 >60 mL/min   GFR calc Af Amer >60 >60 mL/min   Anion gap 7 5 - 15  CBC with Differential  Result Value Ref Range   WBC 7.4 4.0 - 10.5 K/uL   RBC 4.91 3.87 - 5.11 MIL/uL   Hemoglobin 12.4 12.0 - 15.0 g/dL   HCT 39.1 36.0 - 46.0 %   MCV 79.6 78.0 - 100.0 fL   MCH 25.3 (L) 26.0 - 34.0 pg   MCHC 31.7 30.0 - 36.0 g/dL   RDW 15.2 11.5 - 15.5 %   Platelets 233 150 - 400 K/uL   Neutrophils Relative % 63 %   Neutro Abs 4.7 1.7 - 7.7 K/uL   Lymphocytes Relative 22 %   Lymphs Abs 1.6 0.7 - 4.0 K/uL   Monocytes Relative 9 %   Monocytes Absolute 0.6 0.1 - 1.0 K/uL   Eosinophils Relative 5 %   Eosinophils Absolute 0.4 0.0 - 0.7 K/uL   Basophils Relative 1 %   Basophils Absolute 0.1 0.0 - 0.1 K/uL  Urine microscopic-add on  Result Value Ref Range   Squamous Epithelial / LPF 0-5 (A) NONE SEEN   WBC, UA 0-5 0 - 5 WBC/hpf   RBC / HPF 6-30 0 - 5 RBC/hpf   Bacteria, UA RARE (A) NONE SEEN   Complete Blood Count (Most recent):  Lab Results  Component Value Date   WBC 7.4 04/05/2016   HGB 12.4  04/05/2016   HCT 39.1 04/05/2016   MCV 79.6 04/05/2016   PLT 233 04/05/2016   Chemistry (most recent): Lab Results  Component Value Date   NA 137 04/05/2016   K 4.2 04/05/2016   CL 106 04/05/2016   CO2 24 04/05/2016   BUN 15 04/05/2016   CREATININE 0.85 04/05/2016   Diabetic Labs (most recent): Lab Results  Component Value Date   HGBA1C 6.6* 04/04/2016   HGBA1C 6.8* 01/04/2016   HGBA1C 6.8* 09/29/2015    Assessment & Plan:   1. Uncontrolled type 2 diabetes mellitus without complication, with long-term current use of insulin ([place])   Patient came with target glucose profile, and  recent A1c of 6.6% overall improving from 11.5 %.  Glucose logs and insulin administration records pertaining to this visit,  to be scanned into patient's records.  Recent labs reviewed. -Patient remains at a high risk for more acute and chronic complications of diabetes which include CAD, CVA, CKD, retinopathy, and neuropathy. These are all discussed in detail with the patient. -I have re-counseled the patient on diet management and weight loss, by adopting a carbohydrate restricted / protein rich  Diet. Patient is advised to stick to a routine mealtimes to eat 3 meals  a day and avoid unnecessary snacks ( to snack only to correct hypoglycemia).  -Suggestion is made for patient to avoid simple carbohydrates   from their diet including Cakes , Desserts, Ice Cream,  Soda (  diet and regular) , Sweet Tea , Candies,  Chips, Cookies, Artificial Sweeteners,   and "Sugar-free" Products .  This will help patient to have stable blood glucose profile and potentially lose weight.  -The patient is provided with individualized DM education. -I have approached patient to continue on  intensive monitoring of blood glucose and  Therapy as follows, and patient agrees.  - I will continue Levemir  40 units QHS, associated with strict monitoring of glucose  every morning.   -Patient is encouraged to call clinic for blood  glucose levels less than 70 or above 300 mg /dl. - I will continue metformin 500 mg by mouth twice a day, therapeutically suitable for patient..   - Patient specific target  for A1c; LDL, HDL, Triglycerides, and  Waist Circumference were discussed in detail.  2) BP/HTN: Controlled. Continue current medications including ACEI. 3) Lipids/HPL: Intolerant to statins .continue WelChol  4)  Weight/Diet: exercise, and carbohydrates information provided.  5) Chronic Care/Health Maintenance: -Patient on ACEI medications and encouraged to continue to follow up with Ophthalmology, Podiatrist at least yearly or according to recommendations, and advised to quit smoking. I have recommended yearly flu vaccine and pneumonia vaccination at least every 5 years; moderate intensity exercise for up to 150 minutes weekly; and  sleep for at least 7 hours a day.   6) hypothyroidism: She is clinically euthyroid. Her TFTs are consistent with appropriate replacement. I advised her to continue levothyroxine 50 g by mouth every a.m.   - We discussed about correct intake of levothyroxine, at fasting, with water, separated by at least 30 minutes from breakfast, and separated by more than 4 hours from calcium, iron, multivitamins, acid reflux medications (PPIs). -Patient is made aware of the fact that thyroid hormone replacement is needed for life, dose to be adjusted by periodic monitoring of thyroid function tests.   7) history of hyperparathyroidism: She is status post parathyroidectomy. Calcium is  normal at 8.7. -  She did have an erroneous lab showing ARF, which was repeated and returned to normal.   Patient to bring meter and  blood glucose logs during their next visit. Follow up plan: Return in about 3 months (around 07/12/2016) for follow up with pre-visit labs, meter, and logs.  Glade Lloyd, MD Phone: [phone]  Fax: [phone]   04/11/2016, 10:58 AM

## 2016-04-14 ENCOUNTER — Other Ambulatory Visit: Payer: Self-pay | Admitting: "Endocrinology

## 2016-04-16 ENCOUNTER — Other Ambulatory Visit: Payer: Self-pay | Admitting: Urology

## 2016-04-16 ENCOUNTER — Ambulatory Visit (INDEPENDENT_AMBULATORY_CARE_PROVIDER_SITE_OTHER): Payer: BLUE CROSS/BLUE SHIELD | Admitting: Urology

## 2016-04-16 ENCOUNTER — Other Ambulatory Visit (HOSPITAL_COMMUNITY): Payer: Self-pay | Admitting: Internal Medicine

## 2016-04-16 ENCOUNTER — Ambulatory Visit (HOSPITAL_COMMUNITY)
Admission: RE | Admit: 2016-04-16 | Discharge: 2016-04-16 | Disposition: A | Discharge status: Home / Self Care | Payer: BLUE CROSS/BLUE SHIELD | Source: Ambulatory Visit | Attending: Urology | Admitting: Urology

## 2016-04-16 DIAGNOSIS — N200 Calculus of kidney: Secondary | ICD-10-CM

## 2016-04-16 DIAGNOSIS — Z87442 Personal history of urinary calculi: Secondary | ICD-10-CM | POA: Insufficient documentation

## 2016-04-16 DIAGNOSIS — N201 Calculus of ureter: Secondary | ICD-10-CM | POA: Diagnosis not present

## 2016-04-16 DIAGNOSIS — N202 Calculus of kidney with calculus of ureter: Secondary | ICD-10-CM | POA: Diagnosis not present

## 2016-04-23 ENCOUNTER — Ambulatory Visit (HOSPITAL_COMMUNITY)
Admission: RE | Admit: 2016-04-23 | Discharge: 2016-04-23 | Disposition: A | Discharge status: Home / Self Care | Payer: BLUE CROSS/BLUE SHIELD | Source: Ambulatory Visit | Attending: Urology | Admitting: Urology

## 2016-04-23 ENCOUNTER — Ambulatory Visit (INDEPENDENT_AMBULATORY_CARE_PROVIDER_SITE_OTHER): Payer: BLUE CROSS/BLUE SHIELD | Admitting: Urology

## 2016-04-23 ENCOUNTER — Other Ambulatory Visit: Payer: Self-pay | Admitting: Urology

## 2016-04-23 DIAGNOSIS — N200 Calculus of kidney: Secondary | ICD-10-CM | POA: Insufficient documentation

## 2016-04-23 DIAGNOSIS — I878 Other specified disorders of veins: Secondary | ICD-10-CM | POA: Diagnosis not present

## 2016-04-23 DIAGNOSIS — N201 Calculus of ureter: Secondary | ICD-10-CM

## 2016-06-10 ENCOUNTER — Other Ambulatory Visit: Payer: Self-pay | Admitting: "Endocrinology

## 2016-06-28 NOTE — Telephone Encounter (Signed)
Error

## 2016-07-05 ENCOUNTER — Other Ambulatory Visit: Payer: Self-pay

## 2016-07-05 MED ORDER — INSULIN DETEMIR 100 UNIT/ML FLEXPEN: 50.0000 [IU] | PEN_INJECTOR | Freq: Every day | SUBCUTANEOUS | 0 refills | Status: DC

## 2016-07-13 ENCOUNTER — Other Ambulatory Visit: Payer: Self-pay | Admitting: "Endocrinology

## 2016-07-15 ENCOUNTER — Other Ambulatory Visit: Payer: Self-pay | Admitting: "Endocrinology

## 2016-07-15 LAB — COMPLETE METABOLIC PANEL WITH GFR
ALT: 19 U/L (ref 6–29)
AST: 16 U/L (ref 10–35)
Albumin: 3.9 g/dL (ref 3.6–5.1)
Alkaline Phosphatase: 54 U/L (ref 33–130)
BILIRUBIN TOTAL: 0.4 mg/dL (ref 0.2–1.2)
BUN: 14 mg/dL (ref 7–25)
CO2: 25 mmol/L (ref 20–31)
CREATININE: 0.75 mg/dL (ref 0.50–0.99)
Calcium: 9.1 mg/dL (ref 8.6–10.4)
Chloride: 105 mmol/L (ref 98–110)
GFR, Est Non African American: 83 mL/min (ref >=60)
GLUCOSE: 125 mg/dL — AB (ref 65–99)
Potassium: 5.1 mmol/L (ref 3.5–5.3)
SODIUM: 141 mmol/L (ref 135–146)
TOTAL PROTEIN: 6.1 g/dL (ref 6.1–8.1)

## 2016-07-16 LAB — HEMOGLOBIN A1C
Hgb A1c MFr Bld: 6.5 % — ABNORMAL HIGH (ref <5.7)
Mean Plasma Glucose: 140 mg/dL

## 2016-07-19 ENCOUNTER — Ambulatory Visit: Payer: Medicare Other | Admitting: "Endocrinology

## 2016-07-22 ENCOUNTER — Ambulatory Visit (INDEPENDENT_AMBULATORY_CARE_PROVIDER_SITE_OTHER): Payer: Medicare Other | Admitting: "Endocrinology

## 2016-07-22 ENCOUNTER — Encounter: Payer: Self-pay | Admitting: "Endocrinology

## 2016-07-22 VITALS — BP 146/72 | HR 88 | Ht 66.0 in | Wt 154.0 lb

## 2016-07-22 DIAGNOSIS — E782 Mixed hyperlipidemia: Secondary | ICD-10-CM | POA: Diagnosis not present

## 2016-07-22 DIAGNOSIS — E1165 Type 2 diabetes mellitus with hyperglycemia: Secondary | ICD-10-CM

## 2016-07-22 DIAGNOSIS — Z794 Long term (current) use of insulin: Secondary | ICD-10-CM | POA: Diagnosis not present

## 2016-07-22 DIAGNOSIS — I10 Essential (primary) hypertension: Secondary | ICD-10-CM

## 2016-07-22 DIAGNOSIS — E039 Hypothyroidism, unspecified: Secondary | ICD-10-CM

## 2016-07-22 DIAGNOSIS — IMO0001 Reserved for inherently not codable concepts without codable children: Secondary | ICD-10-CM

## 2016-07-22 MED ORDER — LEVOTHYROXINE SODIUM 75 MCG PO TABS: 75.0000 ug | ORAL_TABLET | Freq: Every day | ORAL | 6 refills | Status: DC

## 2016-07-22 NOTE — Progress Notes (Signed)
Subjective:    Patient ID: [name], female    DOB: 25-Nov-1950,    Past Medical History:  Diagnosis Date  . Arthritis   . Asthma    takes Singulair nightly  . Diabetes mellitus    takes levemir daily and Metformin daily  . Generalized headaches   . GERD (gastroesophageal reflux disease)    takes Omeprazole daily  . Herniated disc, cervical   . History of kidney stones   . Hyperlipidemia    takes Welchol daily  . Hypertension    takes Enalapril dialy  . Hypothyroidism    takes Synthroid daily  . IBS (irritable bowel syndrome)   . Joint pain    hands and neck  . Nocturia   . PONV (postoperative nausea and vomiting)   . Seizures ([place])    denies  . TIA (transient ischemic attack)    diagnosed by EEG but never knew she had it  . Trigger finger   . Weakness   . Weakness    in both hands   Past Surgical History:  Procedure Laterality Date  . ABDOMINAL HYSTERECTOMY  1980  . ANTERIOR CERVICAL DECOMP/DISCECTOMY FUSION N/A 10/18/2013   Procedure: Cervical three-four, Cervical four-five, Cervical seven-Thoracic one anterior cervical decompression with fusion plating and bonegraft;  Surgeon: Hosie Spangle, MD;  Location: [facility];  Service: Neurosurgery;  Laterality: N/A;  Cervical three-four, Cervical four-five, Cervical seven-Thoracic one anterior cervical decompression with fusion plating and bonegraft  . APPENDECTOMY     done with hysterectomy  . BACK SURGERY  2007   lumbar fusion  . carpel tunnel Bilateral    left first then right  . cataract implants    . COLONOSCOPY    . ESOPHAGOGASTRODUODENOSCOPY     hx of h pylori  . EYE SURGERY  11/18/2008   right - cataracts  . EYE SURGERY  06/01/2009   left - cataracts  . herniated disc   10/2000, 08/31/2008   C5-C6 then C6-C7  . knee anthroscopy bilateral Bilateral   . LITHOTRIPSY  11/12/2007   right side  . Parathyroid surgery     October 2013  . PARATHYROIDECTOMY  07/02/2012   Procedure: PARATHYROIDECTOMY;   Surgeon: Earnstine Regal, MD;  Location: [facility];  Service: General;  Laterality: N/A;  . TONSILLECTOMY     as a child   Social History   Social History  . Marital status: Married    Spouse name: N/A  . Number of children: N/A  . Years of education: N/A   Social History Main Topics  . Smoking status: Former Smoker    Packs/day: 1.00    Years: 40.00    Quit date: 07/19/2012  . Smokeless tobacco: Never Used     Comment: quit smoking in Oct 2013  . Alcohol use No  . Drug use: No  . Sexual activity: Not Asked   Other Topics Concern  . None   Social History Narrative  . None   Outpatient Encounter Prescriptions as of 07/22/2016  Medication Sig  . Aspirin-Salicylamide-Caffeine (BC HEADACHE) 325-95-16 MG TABS Take 0.5 packets by mouth daily as needed (FOR PAIN).  . BD PEN NEEDLE NANO U/F 32G X 4 MM MISC USE EVERY DAY AS DIRECTED  . enalapril (VASOTEC) 10 MG tablet Take 10 mg by mouth daily.  . Insulin Detemir (LEVEMIR FLEXTOUCH) 100 UNIT/ML Pen Inject 50 Units into the skin at bedtime.  . Lancets (ONETOUCH ULTRASOFT) lancets Use as instructed  .  levothyroxine (SYNTHROID, LEVOTHROID) 75 MCG tablet Take 1 tablet (75 mcg total) by mouth daily before breakfast.  . metFORMIN (GLUCOPHAGE) 1000 MG tablet TAKE 1 TABLET (1,000 MG TOTAL) BY MOUTH 2 (TWO) TIMES DAILY WITH A MEAL.  . metoprolol (LOPRESSOR) 50 MG tablet Take 1.5 tablets (75 mg total) by mouth 2 (two) times daily.  . montelukast (SINGULAIR) 10 MG tablet Take 10 mg by mouth at bedtime.   . ondansetron (ZOFRAN) 4 MG tablet Take 1 tablet (4 mg total) by mouth every 8 (eight) hours as needed for nausea or vomiting.  . ONE TOUCH ULTRA TEST test strip TEST TWICE A DAY AS DIRECTED  . pantoprazole (PROTONIX) 40 MG tablet Take 40 mg by mouth 2 (two) times daily.   . tamsulosin (FLOMAX) 0.4 MG CAPS capsule Take 1 capsule (0.4 mg total) by mouth daily.  . traMADol (ULTRAM) 50 MG tablet Take 1 tablet (50 mg total) by mouth every 6 (six)  hours as needed.  . WELCHOL 625 MG tablet TAKE 1 TABLET BY MOUTH TWICE A DAY  . [DISCONTINUED] levothyroxine (SYNTHROID, LEVOTHROID) 50 MCG tablet TAKE 1 TABLET (50 MCG TOTAL) BY MOUTH DAILY BEFORE BREAKFAST.   No facility-administered encounter medications on file as of 07/22/2016.    ALLERGIES: Allergies  Allergen Reactions  . Advair Diskus [Fluticasone-Salmeterol] Anaphylaxis  . Codeine Nausea And Vomiting  . Hydrocodone-Acetaminophen Nausea And Vomiting  . Oxycodone-Acetaminophen Nausea And Vomiting  . Tetracyclines & Related Itching    Internal   VACCINATION STATUS: Immunization History  Administered Date(s) Administered  . Tdap 07/12/2014    Diabetes  She presents for her follow-up diabetic visit. She has type 2 diabetes mellitus. Onset time: Diagnosed approximately at age  46 years. Her disease course has been improving. Pertinent negatives for hypoglycemia include no confusion, headaches, pallor or seizures. Associated symptoms include fatigue. Pertinent negatives for diabetes include no chest pain, no polydipsia, no polyphagia and no polyuria. Symptoms are improving. There are no diabetic complications. Risk factors for coronary artery disease include tobacco exposure, hypertension and dyslipidemia. Current diabetic treatment includes insulin injections and oral agent (monotherapy). She is compliant with treatment most of the time. Her weight is stable. She is following a generally unhealthy diet. Her breakfast blood glucose range is generally 130-140 mg/dl. An ACE inhibitor/angiotensin II receptor blocker is being taken.  Hyperlipidemia  This is a chronic problem. The current episode started more than 1 year ago. The problem is controlled. Pertinent negatives include no chest pain, myalgias or shortness of breath. Current antihyperlipidemic treatment includes bile acid squestrants. Risk factors for coronary artery disease include dyslipidemia, diabetes mellitus, hypertension and a  sedentary lifestyle.  Hypertension  This is a chronic problem. The current episode started more than 1 year ago. The problem is controlled. Pertinent negatives include no chest pain, headaches, palpitations or shortness of breath. Past treatments include ACE inhibitors.     Review of Systems  Constitutional: Positive for fatigue. Negative for unexpected weight change.  HENT: Negative for trouble swallowing and voice change.   Eyes: Negative for visual disturbance.  Respiratory: Negative for cough, shortness of breath and wheezing.   Cardiovascular: Negative for chest pain, palpitations and leg swelling.  Gastrointestinal: Negative for diarrhea, nausea and vomiting.  Endocrine: Negative for cold intolerance, heat intolerance, polydipsia, polyphagia and polyuria.  Musculoskeletal: Negative for arthralgias and myalgias.  Skin: Negative for color change, pallor, rash and wound.  Neurological: Negative for seizures and headaches.  Psychiatric/Behavioral: Negative for confusion and suicidal ideas.  Objective:    BP (!) 146/72   Pulse 88   Ht 5\' 6"  (1.676 m)   Wt 154 lb (69.9 kg)   BMI 24.86 kg/m   Wt Readings from Last 3 Encounters:  07/22/16 154 lb (69.9 kg)  04/11/16 155 lb (70.3 kg)  04/05/16 152 lb (68.9 kg)    Physical Exam  Constitutional: She is oriented to person, place, and time. She appears well-developed.  HENT:  Head: Normocephalic and atraumatic.  Eyes: EOM are normal.  Neck: Normal range of motion. Neck supple. No tracheal deviation present. No thyromegaly present.  Cardiovascular: Normal rate and regular rhythm.   Pulmonary/Chest: Effort normal and breath sounds normal.  Abdominal: Soft. Bowel sounds are normal. There is no tenderness. There is no guarding.  Musculoskeletal: Normal range of motion. She exhibits no edema.  Neurological: She is alert and oriented to person, place, and time. She has normal reflexes. No cranial nerve deficit. Coordination normal.   Skin: Skin is warm and dry. No rash noted. No erythema. No pallor.  Psychiatric: She has a normal mood and affect. Judgment normal.    Results for orders placed or performed in visit on 07/15/16  COMPLETE METABOLIC PANEL WITH GFR  Result Value Ref Range   Sodium 141 135 - 146 mmol/L   Potassium 5.1 3.5 - 5.3 mmol/L   Chloride 105 98 - 110 mmol/L   CO2 25 20 - 31 mmol/L   Glucose, Bld 125 (H) 65 - 99 mg/dL   BUN 14 7 - 25 mg/dL   Creat 0.75 0.50 - 0.99 mg/dL   Total Bilirubin 0.4 0.2 - 1.2 mg/dL   Alkaline Phosphatase 54 33 - 130 U/L   AST 16 10 - 35 U/L   ALT 19 6 - 29 U/L   Total Protein 6.1 6.1 - 8.1 g/dL   Albumin 3.9 3.6 - 5.1 g/dL   Calcium 9.1 8.6 - 10.4 mg/dL   GFR, Est African American >89 >=60 mL/min   GFR, Est Non African American 83 >=60 mL/min  Hemoglobin A1c  Result Value Ref Range   Hgb A1c MFr Bld 6.5 (H) <5.7 %   Mean Plasma Glucose 140 mg/dL   Complete Blood Count (Most recent): Lab Results  Component Value Date   WBC 7.4 04/05/2016   HGB 12.4 04/05/2016   HCT 39.1 04/05/2016   MCV 79.6 04/05/2016   PLT 233 04/05/2016   Chemistry (most recent): Lab Results  Component Value Date   NA 141 07/15/2016   K 5.1 07/15/2016   CL 105 07/15/2016   CO2 25 07/15/2016   BUN 14 07/15/2016   CREATININE 0.75 07/15/2016   Diabetic Labs (most recent): Lab Results  Component Value Date   HGBA1C 6.5 (H) 07/15/2016   HGBA1C 6.6 (H) 04/04/2016   HGBA1C 6.8 (H) 01/04/2016    Assessment & Plan:   1. Uncontrolled type 2 diabetes mellitus without complication, with long-term current use of insulin ([place])   Patient came with target glucose profile, and  recent A1c of 6.5% overall improving from 11.5 %.  Glucose logs and insulin administration records pertaining to this visit,  to be scanned into patient's records.  Recent labs reviewed. -Patient remains at a high risk for more acute and chronic complications of diabetes which include CAD, CVA, CKD, retinopathy, and  neuropathy. These are all discussed in detail with the patient. -I have re-counseled the patient on diet management and weight loss, by adopting a carbohydrate restricted / protein rich  Diet. Patient is advised to stick to a routine mealtimes to eat 3 meals  a day and avoid unnecessary snacks ( to snack only to correct hypoglycemia).  -Suggestion is made for patient to avoid simple carbohydrates   from their diet including Cakes , Desserts, Ice Cream,  Soda (  diet and regular) , Sweet Tea , Candies,  Chips, Cookies, Artificial Sweeteners,   and "Sugar-free" Products .  This will help patient to have stable blood glucose profile and potentially lose weight.  -The patient is provided with individualized DM education. -I have approached patient to continue on  intensive monitoring of blood glucose and  Therapy as follows, and patient agrees.  - I will continue Levemir  40 units QHS, associated with strict monitoring of glucose  every morning.   -Patient is encouraged to call clinic for blood glucose levels less than 70 or above 300 mg /dl. - I will continue metformin 500 mg by mouth twice a day, therapeutically suitable for patient..   - Patient specific target  for A1c; LDL, HDL, Triglycerides, and  Waist Circumference were discussed in detail.  2) BP/HTN: Controlled. Continue current medications including ACEI. 3) Lipids/HPL: Intolerant to statins .continue WelChol   4)  Weight/Diet: exercise, and carbohydrates information provided.  5) hypothyroidism: She would benefit from a slight increase in her levothyroxine. I will prescribe levothyroxine 75 g by mouth every morning.  - We discussed about correct intake of levothyroxine, at fasting, with water, separated by at least 30 minutes from breakfast, and separated by more than 4 hours from calcium, iron, multivitamins, acid reflux medications (PPIs). -Patient is made aware of the fact that thyroid hormone replacement is needed for life, dose  to be adjusted by periodic monitoring of thyroid function tests.  6) Chronic Care/Health Maintenance: -Patient on ACEI medications and encouraged to continue to follow up with Ophthalmology, Podiatrist at least yearly or according to recommendations, and advised to quit smoking. I have recommended yearly flu vaccine and pneumonia vaccination at least every 5 years; moderate intensity exercise for up to 150 minutes weekly; and  sleep for at least 7 hours a day.  7) history of hyperparathyroidism: She is status post parathyroidectomy. Calcium is stable and normal at 9.1.    Patient to bring meter and  blood glucose logs during their next visit. Follow up plan: Return in about 3 months (around 10/22/2016) for follow up with pre-visit labs, meter, and logs.  Glade Lloyd, MD Phone: [phone]  Fax: [phone]   07/22/2016, 11:53 AM

## 2016-07-22 NOTE — Patient Instructions (Signed)

## 2016-07-23 ENCOUNTER — Other Ambulatory Visit: Payer: Self-pay

## 2016-07-23 MED ORDER — LEVOTHYROXINE SODIUM 75 MCG PO TABS: 75.0000 ug | ORAL_TABLET | Freq: Every day | ORAL | 1 refills | Status: DC

## 2016-07-25 IMAGING — DX DG CHEST 2V
2 series · 2 of 2 positions shown · non-contrast
Comparison: 10/13/2013

CLINICAL DATA: Chest pain and cough for 1 month

EXAM:
CHEST  2 VIEW

[chest pa]
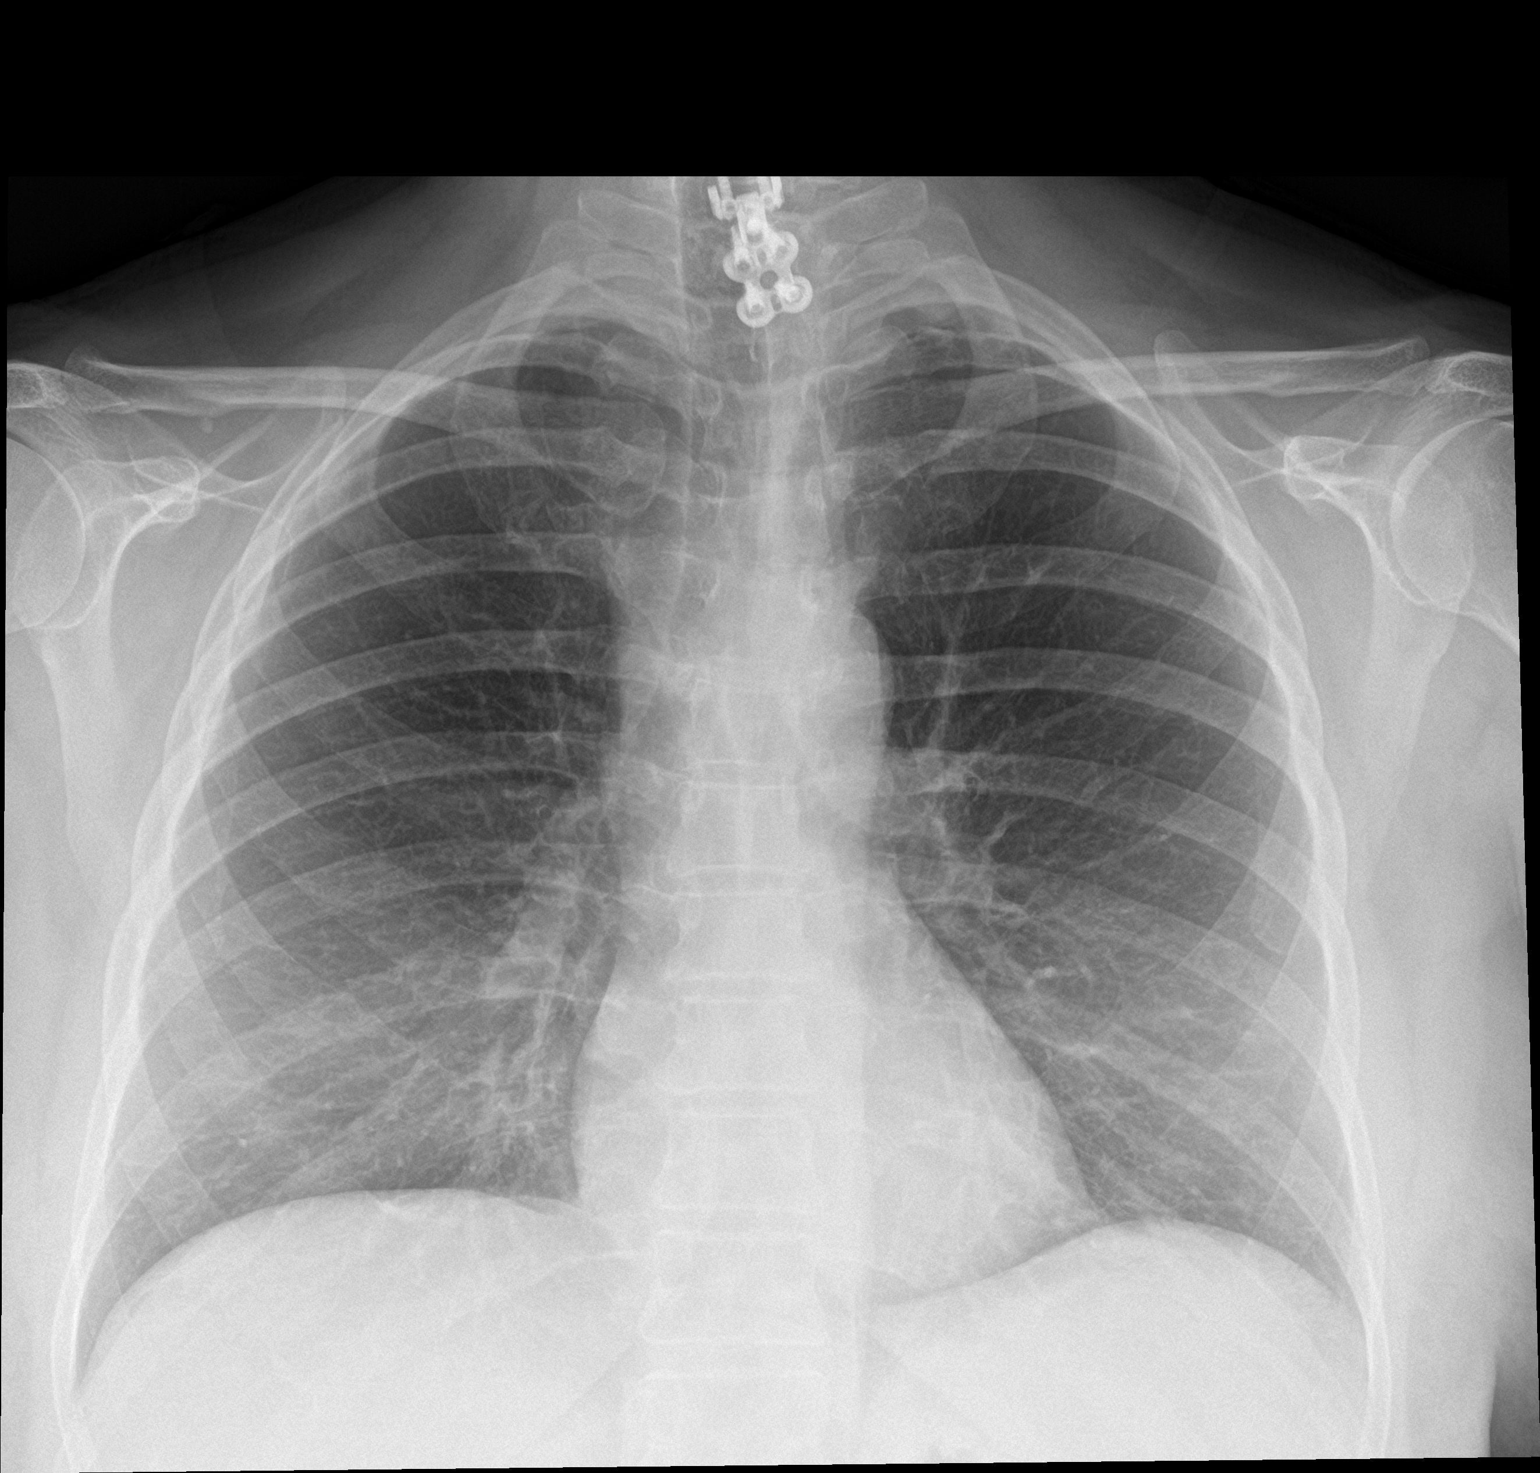

[chest lat]
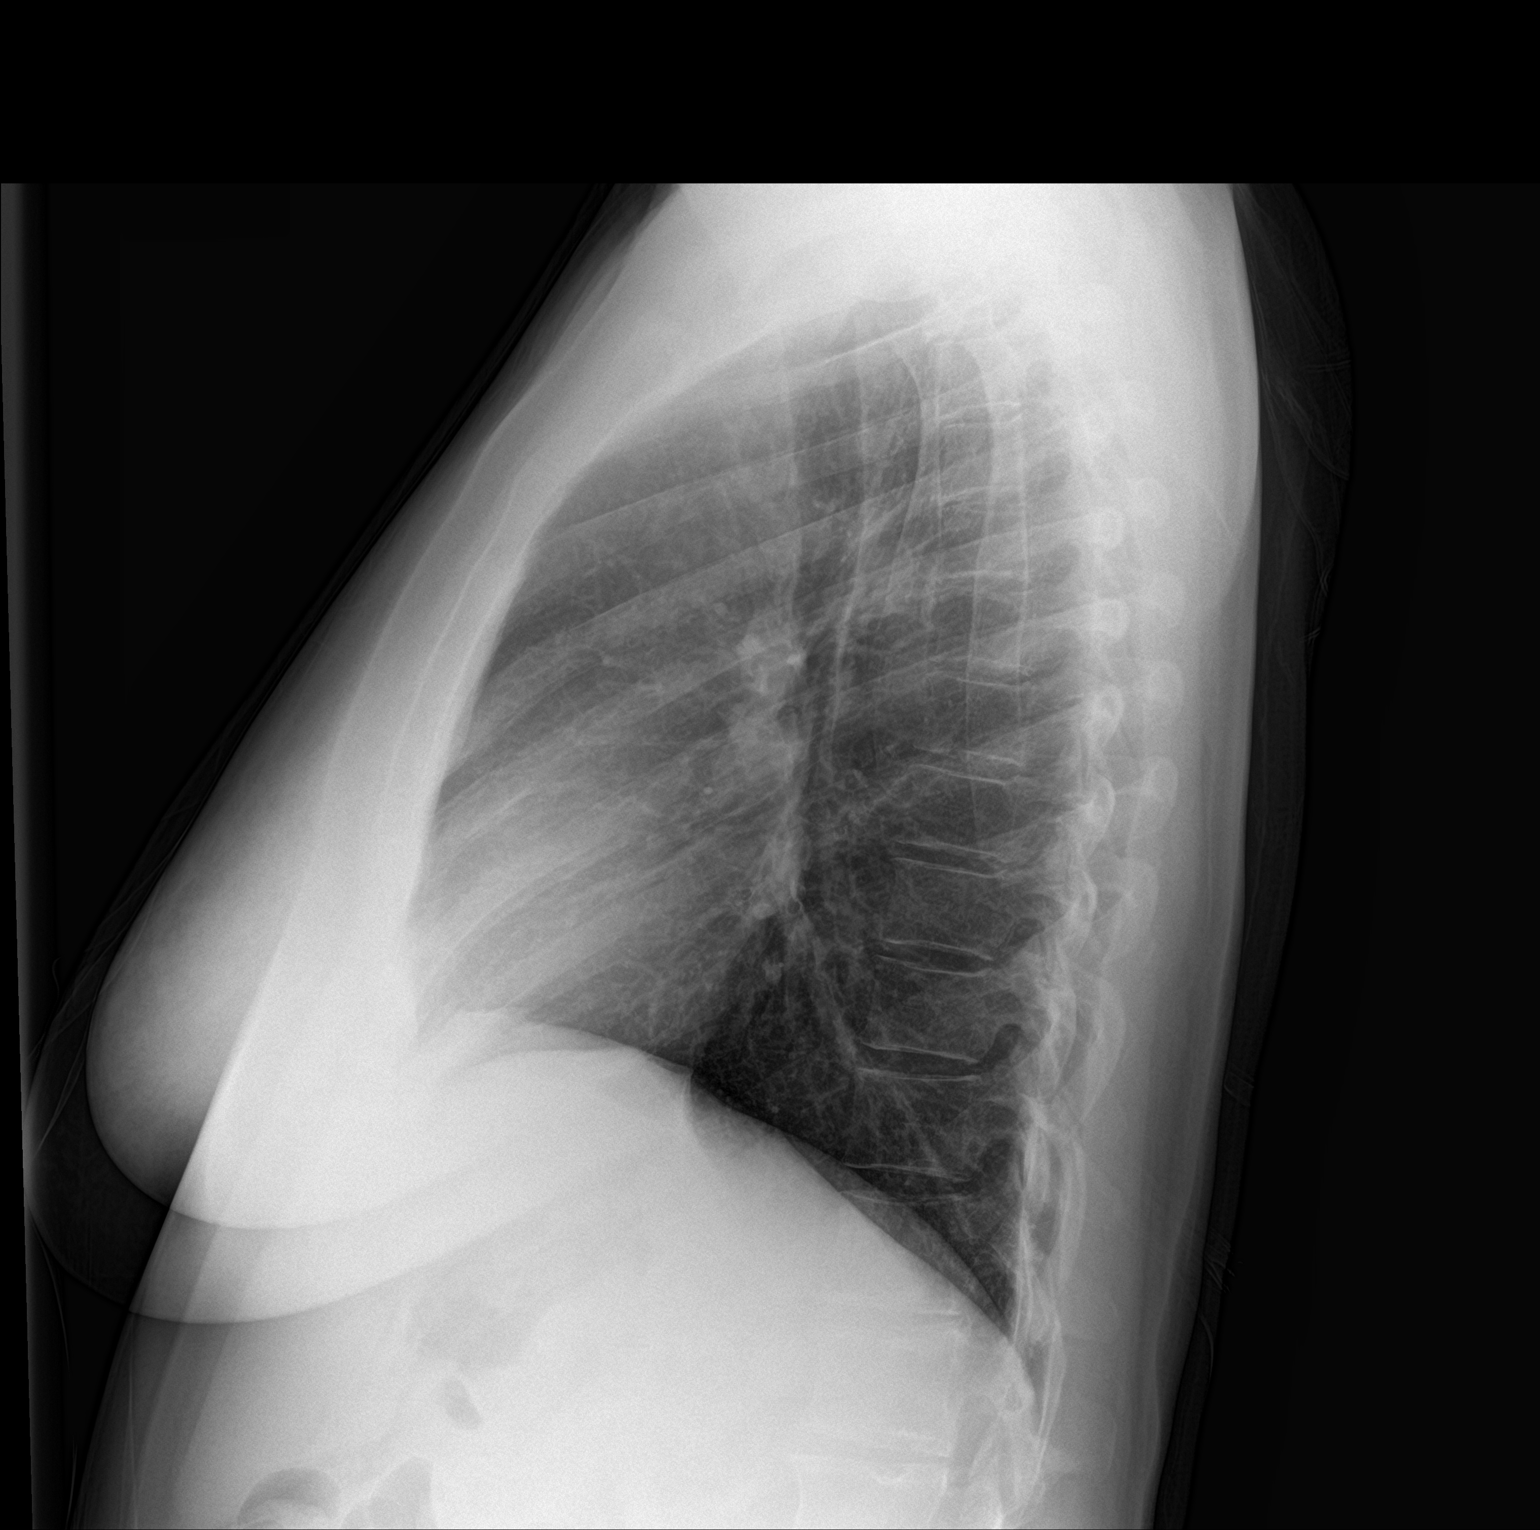

[2 of 2 positions shown; findings below may reference images not displayed]

FINDINGS: The heart size and mediastinal contours are within normal limits.
Both lungs are clear. The visualized skeletal structures are
unremarkable.
IMPRESSION: No active cardiopulmonary disease.

## 2016-08-14 DIAGNOSIS — R070 Pain in throat: Secondary | ICD-10-CM | POA: Diagnosis not present

## 2016-08-14 DIAGNOSIS — Z6823 Body mass index (BMI) 23.0-23.9, adult: Secondary | ICD-10-CM | POA: Diagnosis not present

## 2016-08-14 DIAGNOSIS — J343 Hypertrophy of nasal turbinates: Secondary | ICD-10-CM | POA: Diagnosis not present

## 2016-08-14 DIAGNOSIS — E782 Mixed hyperlipidemia: Secondary | ICD-10-CM | POA: Diagnosis not present

## 2016-08-14 DIAGNOSIS — Z1389 Encounter for screening for other disorder: Secondary | ICD-10-CM | POA: Diagnosis not present

## 2016-08-14 DIAGNOSIS — J329 Chronic sinusitis, unspecified: Secondary | ICD-10-CM | POA: Diagnosis not present

## 2016-09-05 ENCOUNTER — Telehealth: Payer: Self-pay | Admitting: "Endocrinology

## 2016-09-05 NOTE — Telephone Encounter (Signed)
Patient LMOM asking for nurse to please call. She has questions about her insulin and recent blood sugar reading.

## 2016-09-06 NOTE — Telephone Encounter (Signed)
Left message for pt to call back  °

## 2016-09-06 NOTE — Telephone Encounter (Signed)
Pt states she has had lower BG readings than she would like. States she doesn't feel good when her BG is below 100 or low 100's.    Date Before breakfast Before lunch Before supper Bedtime  12/5    120  12/6    99  12/7    111  12/8    104    Pt taking: Levemir 35 units qhs, MTF 1000 bid. She states she lower the levemir from 40 to 35 on her own.

## 2016-09-06 NOTE — Telephone Encounter (Signed)
It is OK, but she needs to test before breakfast and call back if >200 or less than 70.

## 2016-09-09 NOTE — Telephone Encounter (Signed)
Pt.notified

## 2016-09-29 ENCOUNTER — Other Ambulatory Visit: Payer: Self-pay | Admitting: "Endocrinology

## 2016-10-09 ENCOUNTER — Other Ambulatory Visit (HOSPITAL_COMMUNITY): Payer: Self-pay | Admitting: Obstetrics and Gynecology

## 2016-10-09 DIAGNOSIS — Z1231 Encounter for screening mammogram for malignant neoplasm of breast: Secondary | ICD-10-CM

## 2016-10-11 ENCOUNTER — Other Ambulatory Visit (HOSPITAL_COMMUNITY): Payer: Self-pay | Admitting: Physician Assistant

## 2016-10-11 DIAGNOSIS — M25562 Pain in left knee: Secondary | ICD-10-CM | POA: Diagnosis not present

## 2016-10-14 ENCOUNTER — Ambulatory Visit (HOSPITAL_COMMUNITY)
Admission: RE | Admit: 2016-10-14 | Discharge: 2016-10-14 | Disposition: A | Discharge status: Home / Self Care | Payer: BLUE CROSS/BLUE SHIELD | Source: Ambulatory Visit | Attending: Obstetrics and Gynecology | Admitting: Obstetrics and Gynecology

## 2016-10-14 ENCOUNTER — Other Ambulatory Visit: Payer: Self-pay | Admitting: "Endocrinology

## 2016-10-14 DIAGNOSIS — Z1231 Encounter for screening mammogram for malignant neoplasm of breast: Secondary | ICD-10-CM | POA: Diagnosis present

## 2016-10-14 LAB — LIPID PANEL
CHOLESTEROL: 156 mg/dL (ref <200)
HDL: 27 mg/dL — AB (ref >50)
LDL Cholesterol: 96 mg/dL (ref <100)
TRIGLYCERIDES: 163 mg/dL — AB (ref <150)
Total CHOL/HDL Ratio: 5.8 Ratio — ABNORMAL HIGH (ref <5.0)
VLDL: 33 mg/dL — ABNORMAL HIGH (ref <30)

## 2016-10-14 LAB — HEMOGLOBIN A1C
Hgb A1c MFr Bld: 6.2 % — ABNORMAL HIGH (ref <5.7)
MEAN PLASMA GLUCOSE: 131 mg/dL

## 2016-10-14 LAB — COMPREHENSIVE METABOLIC PANEL
ALBUMIN: 4 g/dL (ref 3.6–5.1)
ALK PHOS: 60 U/L (ref 33–130)
ALT: 18 U/L (ref 6–29)
AST: 15 U/L (ref 10–35)
BILIRUBIN TOTAL: 0.3 mg/dL (ref 0.2–1.2)
BUN: 14 mg/dL (ref 7–25)
CALCIUM: 8.9 mg/dL (ref 8.6–10.4)
CO2: 27 mmol/L (ref 20–31)
Chloride: 106 mmol/L (ref 98–110)
Creat: 0.79 mg/dL (ref 0.50–0.99)
Glucose, Bld: 118 mg/dL — ABNORMAL HIGH (ref 65–99)
Potassium: 5 mmol/L (ref 3.5–5.3)
Sodium: 142 mmol/L (ref 135–146)
TOTAL PROTEIN: 6 g/dL — AB (ref 6.1–8.1)

## 2016-10-14 LAB — TSH: TSH: 1.98 m[IU]/L

## 2016-10-14 LAB — T4, FREE: Free T4: 1.5 ng/dL (ref 0.8–1.8)

## 2016-10-15 LAB — MICROALBUMIN / CREATININE URINE RATIO
Creatinine, Urine: 105 mg/dL (ref 20–320)
Microalb Creat Ratio: 7 mcg/mg creat (ref <30)
Microalb, Ur: 0.7 mg/dL

## 2016-10-16 ENCOUNTER — Ambulatory Visit (HOSPITAL_COMMUNITY): Payer: Medicare Other

## 2016-10-21 ENCOUNTER — Ambulatory Visit (INDEPENDENT_AMBULATORY_CARE_PROVIDER_SITE_OTHER): Payer: BLUE CROSS/BLUE SHIELD | Admitting: Cardiovascular Disease

## 2016-10-21 ENCOUNTER — Ambulatory Visit (HOSPITAL_COMMUNITY)
Admission: RE | Admit: 2016-10-21 | Discharge: 2016-10-21 | Disposition: A | Discharge status: Home / Self Care | Payer: BLUE CROSS/BLUE SHIELD | Source: Ambulatory Visit | Attending: Physician Assistant | Admitting: Physician Assistant

## 2016-10-21 ENCOUNTER — Encounter: Payer: Self-pay | Admitting: Cardiovascular Disease

## 2016-10-21 VITALS — BP 155/75 | HR 80 | Ht 66.0 in | Wt 152.0 lb

## 2016-10-21 DIAGNOSIS — I10 Essential (primary) hypertension: Secondary | ICD-10-CM | POA: Diagnosis not present

## 2016-10-21 DIAGNOSIS — R072 Precordial pain: Secondary | ICD-10-CM

## 2016-10-21 DIAGNOSIS — M25462 Effusion, left knee: Secondary | ICD-10-CM | POA: Insufficient documentation

## 2016-10-21 DIAGNOSIS — Z8249 Family history of ischemic heart disease and other diseases of the circulatory system: Secondary | ICD-10-CM

## 2016-10-21 DIAGNOSIS — R0602 Shortness of breath: Secondary | ICD-10-CM | POA: Diagnosis not present

## 2016-10-21 DIAGNOSIS — X58XXXA Exposure to other specified factors, initial encounter: Secondary | ICD-10-CM | POA: Insufficient documentation

## 2016-10-21 DIAGNOSIS — S83242A Other tear of medial meniscus, current injury, left knee, initial encounter: Secondary | ICD-10-CM | POA: Diagnosis not present

## 2016-10-21 DIAGNOSIS — M25562 Pain in left knee: Secondary | ICD-10-CM | POA: Insufficient documentation

## 2016-10-21 DIAGNOSIS — M25762 Osteophyte, left knee: Secondary | ICD-10-CM | POA: Diagnosis not present

## 2016-10-21 MED ORDER — ENALAPRIL MALEATE 20 MG PO TABS: 20.0000 mg | ORAL_TABLET | Freq: Every day | ORAL | Status: DC

## 2016-10-21 NOTE — Progress Notes (Signed)
SUBJECTIVE: The patient returns follow-up of chest pain and shortness of breath. She had been doing well on metoprolol 75 mg twice daily.  Stress test results 11/22/15:  Abnormal ST segment depression of 1.5 mm in leads II, III, aVF, and V5 through V6 at peak heart rate. Chest pain was also reported. Hypertensive response noted. Moderate risk Duke treadmill score of -5.  Blood pressure demonstrated a hypertensive response to exercise.  Small, mild intensity, fixed apical anteroseptal defect that is most consistent with breast attenuation. No clear evidence of ischemia.  This is a low risk study based on perfusion imaging although ECG abnormalities suggest moderately increased risk based on Duke treadmill score. Could be "false positive" result. Would correlate clinically.  Nuclear stress EF: 78%.  She has been having more episodes of chest pain with minimal exertion. She has had more frequent headaches as well. Systolic blood pressure has routinely been running in the 150 range.      Review of Systems: As per "subjective", otherwise negative.  Allergies  Allergen Reactions  . Advair Diskus [Fluticasone-Salmeterol] Anaphylaxis  . Codeine Nausea And Vomiting  . Hydrocodone-Acetaminophen Nausea And Vomiting  . Oxycodone-Acetaminophen Nausea And Vomiting  . Tetracyclines & Related Itching    Internal    Current Outpatient Prescriptions  Medication Sig Dispense Refill  . Aspirin-Salicylamide-Caffeine (BC HEADACHE) 325-95-16 MG TABS Take 0.5 packets by mouth daily as needed (FOR PAIN).    . BD PEN NEEDLE NANO U/F 32G X 4 MM MISC USE EVERY DAY AS DIRECTED 100 each 3  . enalapril (VASOTEC) 10 MG tablet Take 10 mg by mouth daily.    . Lancets (ONETOUCH ULTRASOFT) lancets Use as instructed 100 each 12  . LEVEMIR FLEXTOUCH 100 UNIT/ML Pen INJECT 50 UNITS INTO THE SKIN AT BEDTIME. (Patient taking differently: Inject 40 Units into the skin at bedtime. ) 15 mL 2  . levothyroxine  (SYNTHROID, LEVOTHROID) 75 MCG tablet Take 1 tablet (75 mcg total) by mouth daily before breakfast. 90 tablet 1  . metFORMIN (GLUCOPHAGE) 1000 MG tablet TAKE 1 TABLET (1,000 MG TOTAL) BY MOUTH 2 (TWO) TIMES DAILY WITH A MEAL. 180 tablet 1  . metoprolol (LOPRESSOR) 50 MG tablet Take 1.5 tablets (75 mg total) by mouth 2 (two) times daily. 270 tablet 3  . montelukast (SINGULAIR) 10 MG tablet Take 10 mg by mouth at bedtime.     . ONE TOUCH ULTRA TEST test strip TEST TWICE A DAY AS DIRECTED 200 each 5  . pantoprazole (PROTONIX) 40 MG tablet Take 40 mg by mouth 2 (two) times daily.     Earnestine Mealing 625 MG tablet TAKE 1 TABLET BY MOUTH TWICE A DAY 180 tablet 3   No current facility-administered medications for this visit.     Past Medical History:  Diagnosis Date  . Arthritis   . Asthma    takes Singulair nightly  . Diabetes mellitus    takes levemir daily and Metformin daily  . Generalized headaches   . GERD (gastroesophageal reflux disease)    takes Omeprazole daily  . Herniated disc, cervical   . History of kidney stones   . Hyperlipidemia    takes Welchol daily  . Hypertension    takes Enalapril dialy  . Hypothyroidism    takes Synthroid daily  . IBS (irritable bowel syndrome)   . Joint pain    hands and neck  . Nocturia   . PONV (postoperative nausea and vomiting)   . Seizures ([place])  denies  . TIA (transient ischemic attack)    diagnosed by EEG but never knew she had it  . Trigger finger   . Weakness   . Weakness    in both hands    Past Surgical History:  Procedure Laterality Date  . ABDOMINAL HYSTERECTOMY  1980  . ANTERIOR CERVICAL DECOMP/DISCECTOMY FUSION N/A 10/18/2013   Procedure: Cervical three-four, Cervical four-five, Cervical seven-Thoracic one anterior cervical decompression with fusion plating and bonegraft;  Surgeon: Hosie Spangle, MD;  Location: [facility];  Service: Neurosurgery;  Laterality: N/A;  Cervical three-four, Cervical four-five, Cervical  seven-Thoracic one anterior cervical decompression with fusion plating and bonegraft  . APPENDECTOMY     done with hysterectomy  . BACK SURGERY  2007   lumbar fusion  . carpel tunnel Bilateral    left first then right  . cataract implants    . COLONOSCOPY    . ESOPHAGOGASTRODUODENOSCOPY     hx of h pylori  . EYE SURGERY  11/18/2008   right - cataracts  . EYE SURGERY  06/01/2009   left - cataracts  . herniated disc   10/2000, 08/31/2008   C5-C6 then C6-C7  . knee anthroscopy bilateral Bilateral   . LITHOTRIPSY  11/12/2007   right side  . Parathyroid surgery     October 2013  . PARATHYROIDECTOMY  07/02/2012   Procedure: PARATHYROIDECTOMY;  Surgeon: Earnstine Regal, MD;  Location: [facility];  Service: General;  Laterality: N/A;  . TONSILLECTOMY     as a child    Social History   Social History  . Marital status: Married    Spouse name: N/A  . Number of children: N/A  . Years of education: N/A   Occupational History  . Not on file.   Social History Main Topics  . Smoking status: Former Smoker    Packs/day: 1.00    Years: 40.00    Quit date: 07/19/2012  . Smokeless tobacco: Never Used     Comment: quit smoking in Oct 2013  . Alcohol use No  . Drug use: No  . Sexual activity: Not on file   Other Topics Concern  . Not on file   Social History Narrative  . No narrative on file     Vitals:   10/21/16 1539  BP: (!) 155/75  Pulse: 80  SpO2: 98%  Weight: 152 lb (68.9 kg)  Height: 5\' 6"  (1.676 m)    PHYSICAL EXAM General: NAD HEENT: Normal. Neck: No JVD, no thyromegaly. Lungs: Clear to auscultation bilaterally with normal respiratory effort. CV: Nondisplaced PMI.  Regular rate and rhythm, normal S1/S2, no S3/S4, no murmur. No pretibial or periankle edema.     Abdomen: Soft, nontender, no distention.  Neurologic: Alert and oriented.  Psych: Normal affect. Skin: Normal. Musculoskeletal: No gross deformities.    ECG: Most recent ECG reviewed.      ASSESSMENT  AND PLAN:  1. Chest pain and shortness of breath: Increase in symptom frequency. Currently on metoprolol 75 mg BID. It is likely that paroxysms of chest pain are related to elevated BP. On ASA. Likely also warrants statin therapy given concomitant h/o diabetes. I will increase enalapril to 20 mg daily.  2. Essential HTN: Elevated. I will increase enalapril to 20 mg daily. I have asked the patient to check blood pressure readings 4-5 times per week, at different times throughout the day, in order to get a better approximation of mean BP values. These results will be provided to  me at the end of that period so that I can determine if antihypertensive medication titration is indicated.   Dispo: f/u 3 weeks  Kate Sable, M.D., F.A.C.C.

## 2016-10-21 NOTE — Patient Instructions (Signed)
Medication Instructions:   Increase Enalapril to 20mg  daily.   Continue all other medications.    Labwork: none  Testing/Procedures: none  Follow-Up:  3 weeks   Any Other Special Instructions Will Be Listed Below (If Applicable).  If you need a refill on your cardiac medications before your next appointment, please call your pharmacy.

## 2016-10-22 ENCOUNTER — Ambulatory Visit (INDEPENDENT_AMBULATORY_CARE_PROVIDER_SITE_OTHER): Payer: BLUE CROSS/BLUE SHIELD | Admitting: "Endocrinology

## 2016-10-22 ENCOUNTER — Encounter: Payer: Self-pay | Admitting: "Endocrinology

## 2016-10-22 VITALS — BP 154/70 | HR 74 | Ht 66.0 in | Wt 152.0 lb

## 2016-10-22 DIAGNOSIS — I10 Essential (primary) hypertension: Secondary | ICD-10-CM

## 2016-10-22 DIAGNOSIS — Z794 Long term (current) use of insulin: Secondary | ICD-10-CM

## 2016-10-22 DIAGNOSIS — E1165 Type 2 diabetes mellitus with hyperglycemia: Secondary | ICD-10-CM

## 2016-10-22 DIAGNOSIS — E039 Hypothyroidism, unspecified: Secondary | ICD-10-CM

## 2016-10-22 DIAGNOSIS — E782 Mixed hyperlipidemia: Secondary | ICD-10-CM | POA: Diagnosis not present

## 2016-10-22 DIAGNOSIS — IMO0001 Reserved for inherently not codable concepts without codable children: Secondary | ICD-10-CM

## 2016-10-22 MED ORDER — INSULIN DETEMIR 100 UNIT/ML FLEXPEN: 35.0000 [IU] | PEN_INJECTOR | Freq: Every day | SUBCUTANEOUS | 2 refills | Status: DC

## 2016-10-22 NOTE — Progress Notes (Signed)
Subjective:    Patient ID: [name], female    DOB: [birth year],    Past Medical History:  Diagnosis Date  . Arthritis   . Asthma    takes Singulair nightly  . Diabetes mellitus    takes levemir daily and Metformin daily  . Generalized headaches   . GERD (gastroesophageal reflux disease)    takes Omeprazole daily  . Herniated disc, cervical   . History of kidney stones   . Hyperlipidemia    takes Welchol daily  . Hypertension    takes Enalapril dialy  . Hypothyroidism    takes Synthroid daily  . IBS (irritable bowel syndrome)   . Joint pain    hands and neck  . Nocturia   . PONV (postoperative nausea and vomiting)   . Seizures ([place])    denies  . TIA (transient ischemic attack)    diagnosed by EEG but never knew she had it  . Trigger finger   . Weakness   . Weakness    in both hands   Past Surgical History:  Procedure Laterality Date  . ABDOMINAL HYSTERECTOMY  1980  . ANTERIOR CERVICAL DECOMP/DISCECTOMY FUSION N/A 10/18/2013   Procedure: Cervical three-four, Cervical four-five, Cervical seven-Thoracic one anterior cervical decompression with fusion plating and bonegraft;  Surgeon: Hosie Spangle, MD;  Location: [facility];  Service: Neurosurgery;  Laterality: N/A;  Cervical three-four, Cervical four-five, Cervical seven-Thoracic one anterior cervical decompression with fusion plating and bonegraft  . APPENDECTOMY     done with hysterectomy  . BACK SURGERY  2007   lumbar fusion  . carpel tunnel Bilateral    left first then right  . cataract implants    . COLONOSCOPY    . ESOPHAGOGASTRODUODENOSCOPY     hx of h pylori  . EYE SURGERY  11/18/2008   right - cataracts  . EYE SURGERY  06/01/2009   left - cataracts  . herniated disc   10/2000, 08/31/2008   C5-C6 then C6-C7  . knee anthroscopy bilateral Bilateral   . LITHOTRIPSY  11/12/2007   right side  . Parathyroid surgery     October 2013  . PARATHYROIDECTOMY  07/02/2012   Procedure: PARATHYROIDECTOMY;   Surgeon: Earnstine Regal, MD;  Location: [facility];  Service: General;  Laterality: N/A;  . TONSILLECTOMY     as a child   Social History   Social History  . Marital status: Married    Spouse name: N/A  . Number of children: N/A  . Years of education: N/A   Social History Main Topics  . Smoking status: Former Smoker    Packs/day: 1.00    Years: 40.00    Quit date: 07/19/2012  . Smokeless tobacco: Never Used     Comment: quit smoking in Oct 2013  . Alcohol use No  . Drug use: No  . Sexual activity: Not on file   Other Topics Concern  . Not on file   Social History Narrative  . No narrative on file   Outpatient Encounter Prescriptions as of 10/22/2016  Medication Sig  . Aspirin-Salicylamide-Caffeine (BC HEADACHE) 325-95-16 MG TABS Take 0.5 packets by mouth daily as needed (FOR PAIN).  . BD PEN NEEDLE NANO U/F 32G X 4 MM MISC USE EVERY DAY AS DIRECTED  . enalapril (VASOTEC) 20 MG tablet Take 1 tablet (20 mg total) by mouth daily.  . Lancets (ONETOUCH ULTRASOFT) lancets Use as instructed  . LEVEMIR FLEXTOUCH 100 UNIT/ML Pen INJECT 50  UNITS INTO THE SKIN AT BEDTIME. (Patient taking differently: Inject 40 Units into the skin at bedtime. )  . levothyroxine (SYNTHROID, LEVOTHROID) 75 MCG tablet Take 1 tablet (75 mcg total) by mouth daily before breakfast.  . metFORMIN (GLUCOPHAGE) 1000 MG tablet TAKE 1 TABLET (1,000 MG TOTAL) BY MOUTH 2 (TWO) TIMES DAILY WITH A MEAL.  . metoprolol (LOPRESSOR) 50 MG tablet Take 1.5 tablets (75 mg total) by mouth 2 (two) times daily.  . montelukast (SINGULAIR) 10 MG tablet Take 10 mg by mouth at bedtime.   . ONE TOUCH ULTRA TEST test strip TEST TWICE A DAY AS DIRECTED  . pantoprazole (PROTONIX) 40 MG tablet Take 40 mg by mouth 2 (two) times daily.   Earnestine Mealing 625 MG tablet TAKE 1 TABLET BY MOUTH TWICE A DAY   No facility-administered encounter medications on file as of 10/22/2016.    ALLERGIES: Allergies  Allergen Reactions  . Advair Diskus  [Fluticasone-Salmeterol] Anaphylaxis  . Codeine Nausea And Vomiting  . Hydrocodone-Acetaminophen Nausea And Vomiting  . Oxycodone-Acetaminophen Nausea And Vomiting  . Tetracyclines & Related Itching    Internal   VACCINATION STATUS: Immunization History  Administered Date(s) Administered  . Tdap 07/12/2014    Diabetes  She presents for her follow-up diabetic visit. She has type 2 diabetes mellitus. Onset time: Diagnosed approximately at age  80 years. Her disease course has been improving. Pertinent negatives for hypoglycemia include no confusion, headaches, pallor or seizures. Associated symptoms include fatigue. Pertinent negatives for diabetes include no chest pain, no polydipsia, no polyphagia and no polyuria. Symptoms are improving. There are no diabetic complications. Risk factors for coronary artery disease include tobacco exposure, hypertension and dyslipidemia. Current diabetic treatment includes insulin injections and oral agent (monotherapy). She is compliant with treatment most of the time. Her weight is stable. She is following a generally unhealthy diet. Her breakfast blood glucose range is generally 130-140 mg/dl. An ACE inhibitor/angiotensin II receptor blocker is being taken.  Hyperlipidemia  This is a chronic problem. The current episode started more than 1 year ago. The problem is controlled. Pertinent negatives include no chest pain, myalgias or shortness of breath. Current antihyperlipidemic treatment includes bile acid squestrants. Risk factors for coronary artery disease include dyslipidemia, diabetes mellitus, hypertension and a sedentary lifestyle.  Hypertension  This is a chronic problem. The current episode started more than 1 year ago. The problem is controlled. Pertinent negatives include no chest pain, headaches, palpitations or shortness of breath. Past treatments include ACE inhibitors.     Review of Systems  Constitutional: Positive for fatigue. Negative for  unexpected weight change.  HENT: Negative for trouble swallowing and voice change.   Eyes: Negative for visual disturbance.  Respiratory: Negative for cough, shortness of breath and wheezing.   Cardiovascular: Negative for chest pain, palpitations and leg swelling.  Gastrointestinal: Negative for diarrhea, nausea and vomiting.  Endocrine: Negative for cold intolerance, heat intolerance, polydipsia, polyphagia and polyuria.  Musculoskeletal: Negative for arthralgias and myalgias.  Skin: Negative for color change, pallor, rash and wound.  Neurological: Negative for seizures and headaches.  Psychiatric/Behavioral: Negative for confusion and suicidal ideas.    Objective:    There were no vitals taken for this visit.  Wt Readings from Last 3 Encounters:  10/21/16 152 lb (68.9 kg)  07/22/16 154 lb (69.9 kg)  04/11/16 155 lb (70.3 kg)    Physical Exam  Constitutional: She is oriented to person, place, and time. She appears well-developed.  HENT:  Head: Normocephalic and  atraumatic.  Eyes: EOM are normal.  Neck: Normal range of motion. Neck supple. No tracheal deviation present. No thyromegaly present.  Cardiovascular: Normal rate and regular rhythm.   Pulmonary/Chest: Effort normal and breath sounds normal.  Abdominal: Soft. Bowel sounds are normal. There is no tenderness. There is no guarding.  Musculoskeletal: Normal range of motion. She exhibits no edema.  Neurological: She is alert and oriented to person, place, and time. She has normal reflexes. No cranial nerve deficit. Coordination normal.  Skin: Skin is warm and dry. No rash noted. No erythema. No pallor.  Psychiatric: She has a normal mood and affect. Judgment normal.    Results for orders placed or performed in visit on 10/14/16  Microalbumin / creatinine urine ratio  Result Value Ref Range   Creatinine, Urine 105 20 - 320 mg/dL   Microalb, Ur 0.7 Not estab mg/dL   Microalb Creat Ratio 7 <30 mcg/mg creat  Comprehensive  metabolic panel  Result Value Ref Range   Sodium 142 135 - 146 mmol/L   Potassium 5.0 3.5 - 5.3 mmol/L   Chloride 106 98 - 110 mmol/L   CO2 27 20 - 31 mmol/L   Glucose, Bld 118 (H) 65 - 99 mg/dL   BUN 14 7 - 25 mg/dL   Creat 0.79 0.50 - 0.99 mg/dL   Total Bilirubin 0.3 0.2 - 1.2 mg/dL   Alkaline Phosphatase 60 33 - 130 U/L   AST 15 10 - 35 U/L   ALT 18 6 - 29 U/L   Total Protein 6.0 (L) 6.1 - 8.1 g/dL   Albumin 4.0 3.6 - 5.1 g/dL   Calcium 8.9 8.6 - 10.4 mg/dL  Lipid panel  Result Value Ref Range   Cholesterol 156 <200 mg/dL   Triglycerides 163 (H) <150 mg/dL   HDL 27 (L) >50 mg/dL   Total CHOL/HDL Ratio 5.8 (H) <5.0 Ratio   VLDL 33 (H) <30 mg/dL   LDL Cholesterol 96 <100 mg/dL  TSH  Result Value Ref Range   TSH 1.98 mIU/L  T4, free  Result Value Ref Range   Free T4 1.5 0.8 - 1.8 ng/dL  Hemoglobin A1c  Result Value Ref Range   Hgb A1c MFr Bld 6.2 (H) <5.7 %   Mean Plasma Glucose 131 mg/dL   Complete Blood Count (Most recent): Lab Results  Component Value Date   WBC 7.4 04/05/2016   HGB 12.4 04/05/2016   HCT 39.1 04/05/2016   MCV 79.6 04/05/2016   PLT 233 04/05/2016   Chemistry (most recent): Lab Results  Component Value Date   NA 142 10/14/2016   K 5.0 10/14/2016   CL 106 10/14/2016   CO2 27 10/14/2016   BUN 14 10/14/2016   CREATININE 0.79 10/14/2016   Diabetic Labs (most recent): Lab Results  Component Value Date   HGBA1C 6.2 (H) 10/14/2016   HGBA1C 6.5 (H) 07/15/2016   HGBA1C 6.6 (H) 04/04/2016    Assessment & Plan:   1. Uncontrolled type 2 diabetes mellitus without complication, with long-term current use of insulin ([place])   Patient came with target glucose profile, and  recent A1c of 6.2% overall improving from 11.5 %.  Glucose logs and insulin administration records pertaining to this visit,  to be scanned into patient's records.  Recent labs reviewed. -Patient remains at a high risk for more acute and chronic complications of diabetes which  include CAD, CVA, CKD, retinopathy, and neuropathy. These are all discussed in detail with the patient. -I have re-counseled  the patient on diet management and weight loss, by adopting a carbohydrate restricted / protein rich  Diet. Patient is advised to stick to a routine mealtimes to eat 3 meals  a day and avoid unnecessary snacks ( to snack only to correct hypoglycemia).  -Suggestion is made for patient to avoid simple carbohydrates   from their diet including Cakes , Desserts, Ice Cream,  Soda (  diet and regular) , Sweet Tea , Candies,  Chips, Cookies, Artificial Sweeteners,   and "Sugar-free" Products .  This will help patient to have stable blood glucose profile and potentially lose weight.  -The patient is provided with individualized DM education. -I have approached patient to continue on  intensive monitoring of blood glucose and  Therapy as follows, and patient agrees.  - I will lower Levemir to 35 units daily at bedtime, associated with strict monitoring of glucose  every morning,  And as needed.  -Patient is encouraged to call clinic for blood glucose levels less than 70 or above 300 mg /dl. - I will continue metformin 1000 mg by mouth twice a day, therapeutically suitable for patient..   - Patient specific target  for A1c; LDL, HDL, Triglycerides, and  Waist Circumference were discussed in detail.  2) BP/HTN: Controlled. Continue current medications including ACEI. 3) Lipids/HPL: Intolerant to statins .continue WelChol   4)  Weight/Diet: exercise, and carbohydrates information provided.  5) hypothyroidism:  Her thyroid function tests are consistent with appropriate replacement. I will continue levothyroxine 75 g by mouth every morning.  - We discussed about correct intake of levothyroxine, at fasting, with water, separated by at least 30 minutes from breakfast, and separated by more than 4 hours from calcium, iron, multivitamins, acid reflux medications (PPIs). -Patient is made  aware of the fact that thyroid hormone replacement is needed for life, dose to be adjusted by periodic monitoring of thyroid function tests.  6) Chronic Care/Health Maintenance: -Patient on ACEI medications and encouraged to continue to follow up with Ophthalmology, Podiatrist at least yearly or according to recommendations, and advised to quit smoking. I have recommended yearly flu vaccine and pneumonia vaccination at least every 5 years; moderate intensity exercise for up to 150 minutes weekly; and  sleep for at least 7 hours a day.  7) history of hyperparathyroidism: She is status post parathyroidectomy. Calcium is stable and normal at 9.1.    Patient to bring meter and  blood glucose logs during their next visit. Follow up plan: No Follow-up on file.  Glade Lloyd, MD Phone: [phone]  Fax: [phone]   10/22/2016, 11:05 AM

## 2016-10-22 NOTE — Patient Instructions (Signed)

## 2016-10-25 DIAGNOSIS — M25562 Pain in left knee: Secondary | ICD-10-CM | POA: Diagnosis not present

## 2016-10-29 ENCOUNTER — Other Ambulatory Visit: Payer: Self-pay | Admitting: Cardiovascular Disease

## 2016-10-29 MED ORDER — ENALAPRIL MALEATE 20 MG PO TABS: 20.0000 mg | ORAL_TABLET | Freq: Every day | ORAL | 1 refills | Status: DC

## 2016-10-29 NOTE — Telephone Encounter (Signed)
enalapril (VASOTEC) 20 MG tablet  enalapril (VASOTEC) 20 MG tablet  Please send in 20mg  for 30 day until her next appt to make sure she stays at this strength before sending 90 day  [facility]

## 2016-10-29 NOTE — Telephone Encounter (Signed)
#  [phone] sent to pharmacy  

## 2016-11-11 ENCOUNTER — Encounter: Payer: Self-pay | Admitting: Cardiovascular Disease

## 2016-11-11 ENCOUNTER — Ambulatory Visit (INDEPENDENT_AMBULATORY_CARE_PROVIDER_SITE_OTHER): Payer: BLUE CROSS/BLUE SHIELD | Admitting: Cardiovascular Disease

## 2016-11-11 VITALS — BP 170/78 | HR 77 | Ht 66.0 in | Wt 150.4 lb

## 2016-11-11 DIAGNOSIS — I10 Essential (primary) hypertension: Secondary | ICD-10-CM | POA: Diagnosis not present

## 2016-11-11 DIAGNOSIS — R0602 Shortness of breath: Secondary | ICD-10-CM

## 2016-11-11 DIAGNOSIS — R072 Precordial pain: Secondary | ICD-10-CM

## 2016-11-11 MED ORDER — ENALAPRIL MALEATE 20 MG PO TABS: 40.0000 mg | ORAL_TABLET | Freq: Every day | ORAL | 6 refills | Status: DC

## 2016-11-11 NOTE — Addendum Note (Signed)
Addended by: Laurine Blazer on: 11/11/2016 02:44 PM   Modules accepted: Orders

## 2016-11-11 NOTE — Addendum Note (Signed)
Addended by: Laurine Blazer on: 11/11/2016 03:32 PM   Modules accepted: Orders

## 2016-11-11 NOTE — Patient Instructions (Signed)
Medication Instructions:   Increase Enalapril to 40mg  daily.  Continue all other medications.    Labwork: none  Testing/Procedures: none  Follow-Up: 6 weeks   Any Other Special Instructions Will Be Listed Below (If Applicable). Your physician has requested that you regularly monitor and record your blood pressure readings at home every other day till your next office visit.  Please log these readings and bring to next visit for MD review.    If you need a refill on your cardiac medications before your next appointment, please call your pharmacy.

## 2016-11-11 NOTE — Progress Notes (Signed)
SUBJECTIVE: The patient returns follow-up of chest pain and shortness of breath.   Stress test results 11/22/15:  Abnormal ST segment depression of 1.5 mm in leads II, III, aVF, and V5 through V6 at peak heart rate. Chest pain was also reported. Hypertensive response noted. Moderate risk Duke treadmill score of -5.  Blood pressure demonstrated a hypertensive response to exercise.  Small, mild intensity, fixed apical anteroseptal defect that is most consistent with breast attenuation. No clear evidence of ischemia.  This is a low risk study based on perfusion imaging although ECG abnormalities suggest moderately increased risk based on Duke treadmill score. Could be "false positive" result. Would correlate clinically.  Nuclear stress EF: 78%.  She denies chest pain. She has headaches but also has cervical spine disease and has had surgery for this. She is due to undergo left knee surgery for a torn meniscus this week.  Blood pressures at home: 147-170/60-78.     Review of Systems: As per "subjective", otherwise negative.  Allergies  Allergen Reactions  . Advair Diskus [Fluticasone-Salmeterol] Anaphylaxis  . Codeine Nausea And Vomiting  . Hydrocodone-Acetaminophen Nausea And Vomiting  . Oxycodone-Acetaminophen Nausea And Vomiting  . Tetracyclines & Related Itching    Internal    Current Outpatient Prescriptions  Medication Sig Dispense Refill  . Aspirin-Salicylamide-Caffeine (BC HEADACHE) 325-95-16 MG TABS Take 0.5 packets by mouth daily as needed (FOR PAIN).    . BD PEN NEEDLE NANO U/F 32G X 4 MM MISC USE EVERY DAY AS DIRECTED 100 each 3  . enalapril (VASOTEC) 20 MG tablet Take 1 tablet (20 mg total) by mouth daily. 30 tablet 1  . Insulin Detemir (LEVEMIR FLEXTOUCH) 100 UNIT/ML Pen Inject 35 Units into the skin at bedtime. 10 mL 2  . Lancets (ONETOUCH ULTRASOFT) lancets Use as instructed 100 each 12  . levothyroxine (SYNTHROID, LEVOTHROID) 75 MCG tablet Take 1 tablet  (75 mcg total) by mouth daily before breakfast. 90 tablet 1  . metFORMIN (GLUCOPHAGE) 1000 MG tablet TAKE 1 TABLET (1,000 MG TOTAL) BY MOUTH 2 (TWO) TIMES DAILY WITH A MEAL. 180 tablet 1  . metoprolol (LOPRESSOR) 50 MG tablet Take 1.5 tablets (75 mg total) by mouth 2 (two) times daily. 270 tablet 3  . montelukast (SINGULAIR) 10 MG tablet Take 10 mg by mouth at bedtime.     . ONE TOUCH ULTRA TEST test strip TEST TWICE A DAY AS DIRECTED 200 each 5  . pantoprazole (PROTONIX) 40 MG tablet Take 40 mg by mouth 2 (two) times daily.     . WELCHOL 625 MG tablet TAKE 1 TABLET BY MOUTH TWICE A DAY 180 tablet 3  . traMADol (ULTRAM) 50 MG tablet Take 1 tablet by mouth as needed.     No current facility-administered medications for this visit.     Past Medical History:  Diagnosis Date  . Arthritis   . Asthma    takes Singulair nightly  . Diabetes mellitus    takes levemir daily and Metformin daily  . Generalized headaches   . GERD (gastroesophageal reflux disease)    takes Omeprazole daily  . Herniated disc, cervical   . History of kidney stones   . Hyperlipidemia    takes Welchol daily  . Hypertension    takes Enalapril dialy  . Hypothyroidism    takes Synthroid daily  . IBS (irritable bowel syndrome)   . Joint pain    hands and neck  . Nocturia   . PONV (postoperative nausea  and vomiting)   . Seizures ([place])    denies  . TIA (transient ischemic attack)    diagnosed by EEG but never knew she had it  . Trigger finger   . Weakness   . Weakness    in both hands    Past Surgical History:  Procedure Laterality Date  . ABDOMINAL HYSTERECTOMY  1980  . ANTERIOR CERVICAL DECOMP/DISCECTOMY FUSION N/A 10/18/2013   Procedure: Cervical three-four, Cervical four-five, Cervical seven-Thoracic one anterior cervical decompression with fusion plating and bonegraft;  Surgeon: Hosie Spangle, MD;  Location: [facility];  Service: Neurosurgery;  Laterality: N/A;  Cervical three-four, Cervical  four-five, Cervical seven-Thoracic one anterior cervical decompression with fusion plating and bonegraft  . APPENDECTOMY     done with hysterectomy  . BACK SURGERY  2007   lumbar fusion  . carpel tunnel Bilateral    left first then right  . cataract implants    . COLONOSCOPY    . ESOPHAGOGASTRODUODENOSCOPY     hx of h pylori  . EYE SURGERY  11/18/2008   right - cataracts  . EYE SURGERY  06/01/2009   left - cataracts  . herniated disc   10/2000, 08/31/2008   C5-C6 then C6-C7  . knee anthroscopy bilateral Bilateral   . LITHOTRIPSY  11/12/2007   right side  . Parathyroid surgery     October 2013  . PARATHYROIDECTOMY  07/02/2012   Procedure: PARATHYROIDECTOMY;  Surgeon: Earnstine Regal, MD;  Location: [facility];  Service: General;  Laterality: N/A;  . TONSILLECTOMY     as a child    Social History   Social History  . Marital status: Married    Spouse name: N/A  . Number of children: N/A  . Years of education: N/A   Occupational History  . Not on file.   Social History Main Topics  . Smoking status: Former Smoker    Packs/day: 1.00    Years: 40.00    Quit date: 07/19/2012  . Smokeless tobacco: Never Used     Comment: quit smoking in Oct 2013  . Alcohol use No  . Drug use: No  . Sexual activity: Not on file   Other Topics Concern  . Not on file   Social History Narrative  . No narrative on file     Vitals:   11/11/16 1347  BP: (!) 170/78  Pulse: 77  SpO2: 98%  Weight: 150 lb 6.4 oz (68.2 kg)  Height: 5\' 6"  (1.676 m)    PHYSICAL EXAM General: NAD HEENT: Normal. Neck: No JVD, no thyromegaly. Lungs: Clear to auscultation bilaterally with normal respiratory effort. CV: Nondisplaced PMI.  Regular rate and rhythm, normal S1/S2, no S3/S4, no murmur. No pretibial or periankle edema.  No carotid bruit.   Abdomen: Soft, nontender, no distention.  Neurologic: Alert and oriented.  Psych: Normal affect. Skin: Normal. Musculoskeletal: No gross deformities.    ECG:  Most recent ECG reviewed.      ASSESSMENT AND PLAN: 1. Chest pain and shortness of breath: Symptoms are presently stable. Currently on metoprolol 75 mg BID. It is likely that paroxysms of chest pain are related to elevated BP. On ASA. Likely also warrants statin therapy given concomitant h/o diabetes. I will increase enalapril to 40 mg daily.  2. Essential HTN: Elevated. I will increase enalapril to 40 mg daily. I have asked the patient to check blood pressure readings 4-5 times per week, at different times throughout the day, in order to get  a better approximation of mean BP values. These results will be provided to me at the end of that period so that I can determine if antihypertensive medication titration is indicated.   Dispo: f/u 6 weeks   Kate Sable, M.D., F.A.C.C.

## 2016-11-22 DIAGNOSIS — M7989 Other specified soft tissue disorders: Secondary | ICD-10-CM | POA: Diagnosis not present

## 2016-11-22 DIAGNOSIS — G8918 Other acute postprocedural pain: Secondary | ICD-10-CM | POA: Diagnosis not present

## 2016-11-27 DIAGNOSIS — M25662 Stiffness of left knee, not elsewhere classified: Secondary | ICD-10-CM | POA: Diagnosis not present

## 2016-11-27 DIAGNOSIS — R2689 Other abnormalities of gait and mobility: Secondary | ICD-10-CM | POA: Diagnosis not present

## 2016-11-27 DIAGNOSIS — M25562 Pain in left knee: Secondary | ICD-10-CM | POA: Diagnosis not present

## 2016-12-03 DIAGNOSIS — M25662 Stiffness of left knee, not elsewhere classified: Secondary | ICD-10-CM | POA: Diagnosis not present

## 2016-12-03 DIAGNOSIS — R2689 Other abnormalities of gait and mobility: Secondary | ICD-10-CM | POA: Diagnosis not present

## 2016-12-03 DIAGNOSIS — M25562 Pain in left knee: Secondary | ICD-10-CM | POA: Diagnosis not present

## 2016-12-05 DIAGNOSIS — R2689 Other abnormalities of gait and mobility: Secondary | ICD-10-CM | POA: Diagnosis not present

## 2016-12-05 DIAGNOSIS — M25662 Stiffness of left knee, not elsewhere classified: Secondary | ICD-10-CM | POA: Diagnosis not present

## 2016-12-05 DIAGNOSIS — M25562 Pain in left knee: Secondary | ICD-10-CM | POA: Diagnosis not present

## 2016-12-10 DIAGNOSIS — M25562 Pain in left knee: Secondary | ICD-10-CM | POA: Diagnosis not present

## 2016-12-10 DIAGNOSIS — M25662 Stiffness of left knee, not elsewhere classified: Secondary | ICD-10-CM | POA: Diagnosis not present

## 2016-12-10 DIAGNOSIS — R2689 Other abnormalities of gait and mobility: Secondary | ICD-10-CM | POA: Diagnosis not present

## 2016-12-11 DIAGNOSIS — M25562 Pain in left knee: Secondary | ICD-10-CM | POA: Diagnosis not present

## 2016-12-11 DIAGNOSIS — M25662 Stiffness of left knee, not elsewhere classified: Secondary | ICD-10-CM | POA: Diagnosis not present

## 2016-12-11 DIAGNOSIS — R2689 Other abnormalities of gait and mobility: Secondary | ICD-10-CM | POA: Diagnosis not present

## 2016-12-16 ENCOUNTER — Other Ambulatory Visit: Payer: Self-pay | Admitting: "Endocrinology

## 2016-12-23 ENCOUNTER — Ambulatory Visit: Payer: BLUE CROSS/BLUE SHIELD | Admitting: Cardiovascular Disease

## 2017-01-13 ENCOUNTER — Other Ambulatory Visit: Payer: Self-pay

## 2017-01-13 ENCOUNTER — Other Ambulatory Visit: Payer: Self-pay | Admitting: "Endocrinology

## 2017-01-13 MED ORDER — ONETOUCH DELICA LANCETS 33G MISC: 3 refills | Status: DC

## 2017-01-14 ENCOUNTER — Other Ambulatory Visit: Payer: Self-pay | Admitting: "Endocrinology

## 2017-01-14 DIAGNOSIS — E1165 Type 2 diabetes mellitus with hyperglycemia: Secondary | ICD-10-CM | POA: Diagnosis not present

## 2017-01-14 DIAGNOSIS — Z794 Long term (current) use of insulin: Secondary | ICD-10-CM | POA: Diagnosis not present

## 2017-01-14 LAB — COMPREHENSIVE METABOLIC PANEL
ALBUMIN: 4 g/dL (ref 3.6–5.1)
ALT: 18 U/L (ref 6–29)
AST: 17 U/L (ref 10–35)
Alkaline Phosphatase: 59 U/L (ref 33–130)
BUN: 14 mg/dL (ref 7–25)
CHLORIDE: 105 mmol/L (ref 98–110)
CO2: 27 mmol/L (ref 20–31)
Calcium: 9 mg/dL (ref 8.6–10.4)
Creat: 0.8 mg/dL (ref 0.50–0.99)
Glucose, Bld: 90 mg/dL (ref 65–99)
POTASSIUM: 5 mmol/L (ref 3.5–5.3)
Sodium: 141 mmol/L (ref 135–146)
TOTAL PROTEIN: 6.2 g/dL (ref 6.1–8.1)
Total Bilirubin: 0.4 mg/dL (ref 0.2–1.2)

## 2017-01-15 ENCOUNTER — Other Ambulatory Visit: Payer: Self-pay

## 2017-01-15 LAB — HEMOGLOBIN A1C
HEMOGLOBIN A1C: 6.1 % — AB (ref <5.7)
MEAN PLASMA GLUCOSE: 128 mg/dL

## 2017-01-15 MED ORDER — ONETOUCH DELICA LANCETS 33G MISC: 3 refills | Status: DC

## 2017-01-16 ENCOUNTER — Ambulatory Visit (INDEPENDENT_AMBULATORY_CARE_PROVIDER_SITE_OTHER): Payer: Medicare Other | Admitting: Cardiovascular Disease

## 2017-01-16 ENCOUNTER — Encounter: Payer: Self-pay | Admitting: Cardiovascular Disease

## 2017-01-16 VITALS — BP 147/73 | HR 67 | Ht 66.0 in | Wt 142.0 lb

## 2017-01-16 DIAGNOSIS — R072 Precordial pain: Secondary | ICD-10-CM

## 2017-01-16 DIAGNOSIS — I10 Essential (primary) hypertension: Secondary | ICD-10-CM | POA: Diagnosis not present

## 2017-01-16 DIAGNOSIS — R0602 Shortness of breath: Secondary | ICD-10-CM | POA: Diagnosis not present

## 2017-01-16 MED ORDER — AMLODIPINE BESYLATE 5 MG PO TABS: 5.0000 mg | ORAL_TABLET | Freq: Every day | ORAL | 6 refills | Status: DC

## 2017-01-16 NOTE — Progress Notes (Signed)
SUBJECTIVE: The patient returns follow-up of chest pain and shortness of breath.  Stress test results 11/22/15:  Abnormal ST segment depression of 1.5 mm in leads II, III, aVF, and V5 through V6 at peak heart rate. Chest pain was also reported. Hypertensive response noted. Moderate risk Duke treadmill score of -5.  Blood pressure demonstrated a hypertensive response to exercise.  Small, mild intensity, fixed apical anteroseptal defect that is most consistent with breast attenuation. No clear evidence of ischemia.  This is a low risk study based on perfusion imaging although ECG abnormalities suggest moderately increased risk based on Duke treadmill score. Could be "false positive" result. Would correlate clinically.                              Nuclear stress EF: 78%.  She denies chest pain. She has some mild shortness of breath when her blood pressure is elevated. She brought in her blood pressure log and I reviewed it thoroughly. Systolic blood pressures have ranged from 140-162. She has occasional left foot cramping but denies leg swelling. She had surgery in the left leg.  She also complains of sinus drainage.  She told me that her medications are very expensive, insulin in particular.      Review of Systems: As per "subjective", otherwise negative.  Allergies  Allergen Reactions  . Advair Diskus [Fluticasone-Salmeterol] Anaphylaxis  . Codeine Nausea And Vomiting  . Hydrocodone-Acetaminophen Nausea And Vomiting  . Oxycodone-Acetaminophen Nausea And Vomiting  . Tetracyclines & Related Itching    Internal    Current Outpatient Prescriptions  Medication Sig Dispense Refill  . Aspirin-Salicylamide-Caffeine (BC HEADACHE) 325-95-16 MG TABS Take 0.5 packets by mouth daily as needed (FOR PAIN).    . BD PEN NEEDLE NANO U/F 32G X 4 MM MISC USE EVERY DAY AS DIRECTED 100 each 3  . colesevelam (WELCHOL) 625 MG tablet Take by mouth daily with breakfast.    . enalapril  (VASOTEC) 20 MG tablet Take 2 tablets (40 mg total) by mouth daily. 60 tablet 6  . LEVEMIR FLEXTOUCH 100 UNIT/ML Pen INJECT 50 UNITS INTO THE SKIN AT BEDTIME 15 mL 2  . levothyroxine (SYNTHROID, LEVOTHROID) 75 MCG tablet Take 1 tablet (75 mcg total) by mouth daily before breakfast. 90 tablet 1  . metFORMIN (GLUCOPHAGE) 1000 MG tablet TAKE 1 TABLET (1,000 MG TOTAL) BY MOUTH 2 (TWO) TIMES DAILY WITH A MEAL. 180 tablet 1  . metoprolol (LOPRESSOR) 50 MG tablet Take 1.5 tablets (75 mg total) by mouth 2 (two) times daily. 270 tablet 3  . montelukast (SINGULAIR) 10 MG tablet Take 10 mg by mouth at bedtime.     . ONE TOUCH ULTRA TEST test strip TEST TWICE A DAY AS DIRECTED 200 each 5  . ONETOUCH DELICA LANCETS 38B MISC Use as instructed bid E11.65 100 each 3  . pantoprazole (PROTONIX) 40 MG tablet Take 40 mg by mouth daily.    . traMADol (ULTRAM) 50 MG tablet Take 1 tablet by mouth as needed.     No current facility-administered medications for this visit.     Past Medical History:  Diagnosis Date  . Arthritis   . Asthma    takes Singulair nightly  . Diabetes mellitus    takes levemir daily and Metformin daily  . Generalized headaches   . GERD (gastroesophageal reflux disease)    takes Omeprazole daily  . Herniated disc, cervical   . History  of kidney stones   . Hyperlipidemia    takes Welchol daily  . Hypertension    takes Enalapril dialy  . Hypothyroidism    takes Synthroid daily  . IBS (irritable bowel syndrome)   . Joint pain    hands and neck  . Nocturia   . PONV (postoperative nausea and vomiting)   . Seizures ([place])    denies  . TIA (transient ischemic attack)    diagnosed by EEG but never knew she had it  . Trigger finger   . Weakness   . Weakness    in both hands    Past Surgical History:  Procedure Laterality Date  . ABDOMINAL HYSTERECTOMY  1980  . ANTERIOR CERVICAL DECOMP/DISCECTOMY FUSION N/A 10/18/2013   Procedure: Cervical three-four, Cervical four-five,  Cervical seven-Thoracic one anterior cervical decompression with fusion plating and bonegraft;  Surgeon: Hosie Spangle, MD;  Location: [facility] [facility];  Service: Neurosurgery;  Laterality: N/A;  Cervical three-four, Cervical four-five, Cervical seven-Thoracic one anterior cervical decompression with fusion plating and bonegraft  . APPENDECTOMY     done with hysterectomy  . BACK SURGERY  2007   lumbar fusion  . carpel tunnel Bilateral    left first then right  . cataract implants    . COLONOSCOPY    . ESOPHAGOGASTRODUODENOSCOPY     hx of h pylori  . EYE SURGERY  11/18/2008   right - cataracts  . EYE SURGERY  06/01/2009   left - cataracts  . herniated disc   10/2000, 08/31/2008   C5-C6 then C6-C7  . knee anthroscopy bilateral Bilateral   . KNEE ARTHROSCOPY    . LITHOTRIPSY  11/12/2007   right side  . Parathyroid surgery     October 2013  . PARATHYROIDECTOMY  07/02/2012   Procedure: PARATHYROIDECTOMY;  Surgeon: Earnstine Regal, MD;  Location: [facility];  Service: General;  Laterality: N/A;  . TONSILLECTOMY     as a child    Social History   Social History  . Marital status: Married    Spouse name: N/A  . Number of children: N/A  . Years of education: N/A   Occupational History  . Not on file.   Social History Main Topics  . Smoking status: Former Smoker    Packs/day: 1.00    Years: 40.00    Quit date: 07/19/2012  . Smokeless tobacco: Never Used     Comment: quit smoking in Oct 2013  . Alcohol use No  . Drug use: No  . Sexual activity: Not on file   Other Topics Concern  . Not on file   Social History Narrative  . No narrative on file     Vitals:   01/16/17 1139  BP: (!) 147/73  Pulse: 67  SpO2: 97%  Weight: 142 lb (64.4 kg)  Height: 5\' 6"  (1.676 m)    Wt Readings from Last 3 Encounters:  01/16/17 142 lb (64.4 kg)  11/11/16 150 lb 6.4 oz (68.2 kg)  10/22/16 152 lb (68.9 kg)     PHYSICAL EXAM General: NAD HEENT: Normal. Neck: No JVD, no  thyromegaly. Lungs: Clear to auscultation bilaterally with normal respiratory effort. CV: Nondisplaced PMI.  Regular rate and rhythm, normal S1/S2, no S3/S4, no murmur. No pretibial or periankle edema.  No carotid bruit.   Abdomen: Soft, nontender, no distention.  Neurologic: Alert and oriented.  Psych: Normal affect. Skin: Normal. Musculoskeletal: No gross deformities.    ECG: Most recent ECG reviewed.   Labs:  Lab Results  Component Value Date/Time   K 5.0 01/14/2017 08:05 AM   BUN 14 01/14/2017 08:05 AM   CREATININE 0.80 01/14/2017 08:05 AM   ALT 18 01/14/2017 08:05 AM   TSH 1.98 10/14/2016 07:21 AM   HGB 12.4 04/05/2016 04:16 PM     Lipids: Lab Results  Component Value Date/Time   LDLCALC 96 10/14/2016 07:21 AM   CHOL 156 10/14/2016 07:21 AM   TRIG 163 (H) 10/14/2016 07:21 AM   HDL 27 (L) 10/14/2016 07:21 AM       ASSESSMENT AND PLAN: 1. Chest pain and shortness of breath: Symptomatically stable overall. Shortness of breath occurs when blood pressure is elevated. I will aim to control this with the addition of amlodipine 5 mg daily. Continue metoprolol 75 mg twice daily. Continue aspirin and enalapril. Prior paroxysms of chest pain appeared to have been related to elevated blood pressure.  2. Essential HTN: It remains elevated. I reviewed her blood pressure log in detail. I will add amlodipine 5 mg daily and continue enalapril 40 mg daily. I asked her to continue to monitor her blood pressures.     Disposition: Follow up 3 months  Kate Sable, M.D., F.A.C.C.

## 2017-01-16 NOTE — Patient Instructions (Signed)
Medication Instructions:   Begin Amlodipine 5mg daily.  Continue all other medications.    Labwork: none  Testing/Procedures: none  Follow-Up: 3 months   Any Other Special Instructions Will Be Listed Below (If Applicable).  If you need a refill on your cardiac medications before your next appointment, please call your pharmacy.  

## 2017-01-21 ENCOUNTER — Telehealth: Payer: Self-pay | Admitting: Cardiovascular Disease

## 2017-01-21 NOTE — Telephone Encounter (Signed)
Patient called stating that since she started taking the Amlodipine 5 mg her blood pressure seems to be lower.  States that she is nausea, dizzy, light headed today. Concerned that the medication is not agreeing with her.  Please call [phone].

## 2017-01-21 NOTE — Telephone Encounter (Signed)
For the past couple of days, patient c/o having stomach issues, nausea, gas and chest discomfort on left side rated 3/10 that restarted on yesterday. Patient said she used 2 rolaids and the discomfort went away. Patient said she had to lay down and take a nap today because of being fatigue and after she got up from taking a nap, she felt dizzy x's one while walking across the room. Patient said she got up at her regular speed when this happened. Patient said she started amlodipine 5 mg on 01/17/17 daily in the morning with her other medications. BP on 01/17/17 was 111/60, on 01/19/17 was 148/62 and on 01/21/17 was 141/64. No c/o vomiting, fever, chills, sob or swelling. Nurse advised patient to have bp monitor checked for accuracy at her endocrinologist appointment on tomorrow since she has never had it checked, also patient advised to take amlodipine at bedtime starting tomorrow night since she has already taken it today, and patient advised to get up slower when changing positions. Patient advised that nausea, fatigue and dizziness are side effects of starting this new medication and that these symptoms should go away after taking it for a couple of weeks. Patient advised to contact our office back if her symptoms didn't improve or got worse. Patient verbalized understanding of plan.

## 2017-01-22 ENCOUNTER — Ambulatory Visit (INDEPENDENT_AMBULATORY_CARE_PROVIDER_SITE_OTHER): Payer: Medicare Other | Admitting: "Endocrinology

## 2017-01-22 ENCOUNTER — Encounter: Payer: Self-pay | Admitting: "Endocrinology

## 2017-01-22 VITALS — BP 157/76 | HR 75 | Ht 66.0 in | Wt 148.0 lb

## 2017-01-22 DIAGNOSIS — E782 Mixed hyperlipidemia: Secondary | ICD-10-CM | POA: Diagnosis not present

## 2017-01-22 DIAGNOSIS — I10 Essential (primary) hypertension: Secondary | ICD-10-CM | POA: Diagnosis not present

## 2017-01-22 DIAGNOSIS — Z794 Long term (current) use of insulin: Secondary | ICD-10-CM | POA: Diagnosis not present

## 2017-01-22 DIAGNOSIS — IMO0001 Reserved for inherently not codable concepts without codable children: Secondary | ICD-10-CM

## 2017-01-22 DIAGNOSIS — E039 Hypothyroidism, unspecified: Secondary | ICD-10-CM

## 2017-01-22 DIAGNOSIS — E1165 Type 2 diabetes mellitus with hyperglycemia: Secondary | ICD-10-CM

## 2017-01-22 NOTE — Telephone Encounter (Signed)
Agree with your recommendations 

## 2017-01-22 NOTE — Progress Notes (Signed)
Subjective:    Patient ID: [name], female    DOB: 1951/01/10,    Past Medical History:  Diagnosis Date  . Arthritis   . Asthma    takes Singulair nightly  . Diabetes mellitus    takes levemir daily and Metformin daily  . Generalized headaches   . GERD (gastroesophageal reflux disease)    takes Omeprazole daily  . Herniated disc, cervical   . History of kidney stones   . Hyperlipidemia    takes Welchol daily  . Hypertension    takes Enalapril dialy  . Hypothyroidism    takes Synthroid daily  . IBS (irritable bowel syndrome)   . Joint pain    hands and neck  . Nocturia   . PONV (postoperative nausea and vomiting)   . Seizures ([place])    denies  . TIA (transient ischemic attack)    diagnosed by EEG but never knew she had it  . Trigger finger   . Weakness   . Weakness    in both hands   Past Surgical History:  Procedure Laterality Date  . ABDOMINAL HYSTERECTOMY  1980  . ANTERIOR CERVICAL DECOMP/DISCECTOMY FUSION N/A 10/18/2013   Procedure: Cervical three-four, Cervical four-five, Cervical seven-Thoracic one anterior cervical decompression with fusion plating and bonegraft;  Surgeon: Hosie Spangle, MD;  Location: [place] [facility];  Service: Neurosurgery;  Laterality: N/A;  Cervical three-four, Cervical four-five, Cervical seven-Thoracic one anterior cervical decompression with fusion plating and bonegraft  . APPENDECTOMY     done with hysterectomy  . BACK SURGERY  2007   lumbar fusion  . carpel tunnel Bilateral    left first then right  . cataract implants    . COLONOSCOPY    . ESOPHAGOGASTRODUODENOSCOPY     hx of h pylori  . EYE SURGERY  11/18/2008   right - cataracts  . EYE SURGERY  06/01/2009   left - cataracts  . herniated disc   10/2000, 08/31/2008   C5-C6 then C6-C7  . knee anthroscopy bilateral Bilateral   . KNEE ARTHROSCOPY    . LITHOTRIPSY  11/12/2007   right side  . Parathyroid surgery     October 2013  . PARATHYROIDECTOMY  07/02/2012    Procedure: PARATHYROIDECTOMY;  Surgeon: Earnstine Regal, MD;  Location: [facility];  Service: General;  Laterality: N/A;  . TONSILLECTOMY     as a child   Social History   Social History  . Marital status: Married    Spouse name: N/A  . Number of children: N/A  . Years of education: N/A   Social History Main Topics  . Smoking status: Former Smoker    Packs/day: 1.00    Years: 40.00    Quit date: 07/19/2012  . Smokeless tobacco: Never Used     Comment: quit smoking in Oct 2013  . Alcohol use No  . Drug use: No  . Sexual activity: Not Asked   Other Topics Concern  . None   Social History Narrative  . None   Outpatient Encounter Prescriptions as of 01/22/2017  Medication Sig  . amLODipine (NORVASC) 5 MG tablet Take 1 tablet (5 mg total) by mouth daily.  . Aspirin-Salicylamide-Caffeine (BC HEADACHE) 325-95-16 MG TABS Take 0.5 packets by mouth daily as needed (FOR PAIN).  . BD PEN NEEDLE NANO U/F 32G X 4 MM MISC USE EVERY DAY AS DIRECTED  . colesevelam (WELCHOL) 625 MG tablet Take by mouth daily with breakfast.  . enalapril (VASOTEC) 20  MG tablet Take 2 tablets (40 mg total) by mouth daily.  Marland Kitchen LEVEMIR FLEXTOUCH 100 UNIT/ML Pen INJECT 50 UNITS INTO THE SKIN AT BEDTIME  . levothyroxine (SYNTHROID, LEVOTHROID) 75 MCG tablet Take 1 tablet (75 mcg total) by mouth daily before breakfast.  . metFORMIN (GLUCOPHAGE) 1000 MG tablet TAKE 1 TABLET (1,000 MG TOTAL) BY MOUTH 2 (TWO) TIMES DAILY WITH A MEAL.  . metoprolol (LOPRESSOR) 50 MG tablet Take 1.5 tablets (75 mg total) by mouth 2 (two) times daily.  . montelukast (SINGULAIR) 10 MG tablet Take 10 mg by mouth at bedtime.   . ONE TOUCH ULTRA TEST test strip TEST TWICE A DAY AS DIRECTED  . ONETOUCH DELICA LANCETS 54Y MISC Use as instructed bid E11.65  . pantoprazole (PROTONIX) 40 MG tablet Take 40 mg by mouth daily.  . traMADol (ULTRAM) 50 MG tablet Take 1 tablet by mouth as needed.   No facility-administered encounter medications on file as  of 01/22/2017.    ALLERGIES: Allergies  Allergen Reactions  . Advair Diskus [Fluticasone-Salmeterol] Anaphylaxis  . Codeine Nausea And Vomiting  . Hydrocodone-Acetaminophen Nausea And Vomiting  . Oxycodone-Acetaminophen Nausea And Vomiting  . Tetracyclines & Related Itching    Internal   VACCINATION STATUS: Immunization History  Administered Date(s) Administered  . Tdap 07/12/2014    Diabetes  She presents for her follow-up diabetic visit. She has type 2 diabetes mellitus. Onset time: Diagnosed approximately at age  34 years. Her disease course has been improving. Pertinent negatives for hypoglycemia include no confusion, headaches, pallor or seizures. Associated symptoms include fatigue. Pertinent negatives for diabetes include no chest pain, no polydipsia, no polyphagia and no polyuria. Symptoms are improving. There are no diabetic complications. Risk factors for coronary artery disease include tobacco exposure, hypertension and dyslipidemia. Current diabetic treatment includes insulin injections and oral agent (monotherapy). She is compliant with treatment most of the time. Her weight is stable. She is following a generally unhealthy diet. Her breakfast blood glucose range is generally 130-140 mg/dl. An ACE inhibitor/angiotensin II receptor blocker is being taken.  Hyperlipidemia  This is a chronic problem. The current episode started more than 1 year ago. The problem is controlled. Pertinent negatives include no chest pain, myalgias or shortness of breath. Current antihyperlipidemic treatment includes bile acid squestrants. Risk factors for coronary artery disease include dyslipidemia, diabetes mellitus, hypertension and a sedentary lifestyle.  Hypertension  This is a chronic problem. The current episode started more than 1 year ago. The problem is controlled. Pertinent negatives include no chest pain, headaches, palpitations or shortness of breath. Past treatments include ACE inhibitors.      Review of Systems  Constitutional: Positive for fatigue. Negative for unexpected weight change.  HENT: Negative for trouble swallowing and voice change.   Eyes: Negative for visual disturbance.  Respiratory: Negative for cough, shortness of breath and wheezing.   Cardiovascular: Negative for chest pain, palpitations and leg swelling.  Gastrointestinal: Negative for diarrhea, nausea and vomiting.  Endocrine: Negative for cold intolerance, heat intolerance, polydipsia, polyphagia and polyuria.  Musculoskeletal: Negative for arthralgias and myalgias.  Skin: Negative for color change, pallor, rash and wound.  Neurological: Negative for seizures and headaches.  Psychiatric/Behavioral: Negative for confusion and suicidal ideas.    Objective:    BP (!) 157/76   Pulse 75   Ht 5\' 6"  (1.676 m)   Wt 148 lb (67.1 kg)   BMI 23.89 kg/m   Wt Readings from Last 3 Encounters:  01/22/17 148 lb (67.1 kg)  01/16/17 142 lb (64.4 kg)  11/11/16 150 lb 6.4 oz (68.2 kg)    Physical Exam  Constitutional: She is oriented to person, place, and time. She appears well-developed.  HENT:  Head: Normocephalic and atraumatic.  Eyes: EOM are normal.  Neck: Normal range of motion. Neck supple. No tracheal deviation present. No thyromegaly present.  Cardiovascular: Normal rate and regular rhythm.   Pulmonary/Chest: Effort normal and breath sounds normal.  Abdominal: Soft. Bowel sounds are normal. There is no tenderness. There is no guarding.  Musculoskeletal: Normal range of motion. She exhibits no edema.  Neurological: She is alert and oriented to person, place, and time. She has normal reflexes. No cranial nerve deficit. Coordination normal.  Skin: Skin is warm and dry. No rash noted. No erythema. No pallor.  Psychiatric: She has a normal mood and affect. Judgment normal.    Results for orders placed or performed in visit on 01/14/17  Comprehensive metabolic panel  Result Value Ref Range   Sodium  141 135 - 146 mmol/L   Potassium 5.0 3.5 - 5.3 mmol/L   Chloride 105 98 - 110 mmol/L   CO2 27 20 - 31 mmol/L   Glucose, Bld 90 65 - 99 mg/dL   BUN 14 7 - 25 mg/dL   Creat 0.80 0.50 - 0.99 mg/dL   Total Bilirubin 0.4 0.2 - 1.2 mg/dL   Alkaline Phosphatase 59 33 - 130 U/L   AST 17 10 - 35 U/L   ALT 18 6 - 29 U/L   Total Protein 6.2 6.1 - 8.1 g/dL   Albumin 4.0 3.6 - 5.1 g/dL   Calcium 9.0 8.6 - 10.4 mg/dL  Hemoglobin A1c  Result Value Ref Range   Hgb A1c MFr Bld 6.1 (H) <5.7 %   Mean Plasma Glucose 128 mg/dL   Complete Blood Count (Most recent): Lab Results  Component Value Date   WBC 7.4 04/05/2016   HGB 12.4 04/05/2016   HCT 39.1 04/05/2016   MCV 79.6 04/05/2016   PLT 233 04/05/2016   Chemistry (most recent): Lab Results  Component Value Date   NA 141 01/14/2017   K 5.0 01/14/2017   CL 105 01/14/2017   CO2 27 01/14/2017   BUN 14 01/14/2017   CREATININE 0.80 01/14/2017   Diabetic Labs (most recent): Lab Results  Component Value Date   HGBA1C 6.1 (H) 01/14/2017   HGBA1C 6.2 (H) 10/14/2016   HGBA1C 6.5 (H) 07/15/2016    Assessment & Plan:   1. Uncontrolled type 2 diabetes mellitus without complication, with long-term current use of insulin ([place])   Patient came with target glucose profile, and  recent A1c of 6.1% overall improving from 11.5 %.  Glucose logs and insulin administration records pertaining to this visit,  to be scanned into patient's records.  Recent labs reviewed. -Patient remains at a high risk for more acute and chronic complications of diabetes which include CAD, CVA, CKD, retinopathy, and neuropathy. These are all discussed in detail with the patient. -I have re-counseled the patient on diet management and weight loss, by adopting a carbohydrate restricted / protein rich  Diet. Patient is advised to stick to a routine mealtimes to eat 3 meals  a day and avoid unnecessary snacks ( to snack only to correct hypoglycemia).  -Suggestion is made for  patient to avoid simple carbohydrates   from their diet including Cakes , Desserts, Ice Cream,  Soda (  diet and regular) , Sweet Tea , Candies,  Chips, Cookies, Artificial  Sweeteners,   and "Sugar-free" Products .  This will help patient to have stable blood glucose profile and potentially lose weight.  -The patient is provided with individualized DM education. -I have approached patient to continue on  intensive monitoring of blood glucose and  Therapy as follows, and patient agrees.  - I will lower Levemir to 35 units daily at bedtime, associated with strict monitoring of glucose  every morning,  And as needed.  -Patient is encouraged to call clinic for blood glucose levels less than 70 or above 300 mg /dl. - I will continue metformin 1000 mg by mouth twice a day, therapeutically suitable for patient..   - Patient specific target  for A1c; LDL, HDL, Triglycerides, and  Waist Circumference were discussed in detail.  2) BP/HTN: unontrolled.  Continue current medications including ACEI, amlodipine was recently added by her PMD. I have advised her to measure her blood pressure at home, report if readings are above 140/90. 3) Lipids/HPL: Intolerant to statins .continue WelChol   4)  Weight/Diet: exercise, and carbohydrates information provided.  5) hypothyroidism:  Her thyroid function tests are consistent with appropriate replacement. I will continue levothyroxine 75 g by mouth every morning.  - We discussed about correct intake of levothyroxine, at fasting, with water, separated by at least 30 minutes from breakfast, and separated by more than 4 hours from calcium, iron, multivitamins, acid reflux medications (PPIs). -Patient is made aware of the fact that thyroid hormone replacement is needed for life, dose to be adjusted by periodic monitoring of thyroid function tests.  6) Chronic Care/Health Maintenance: -Patient on ACEI medications and encouraged to continue to follow up with  Ophthalmology, Podiatrist at least yearly or according to recommendations, and advised to quit smoking. I have recommended yearly flu vaccine and pneumonia vaccination at least every 5 years; moderate intensity exercise for up to 150 minutes weekly; and  sleep for at least 7 hours a day.  7) history of hyperparathyroidism: She is status post parathyroidectomy. Calcium is stable and normal at 9.1.    Patient to bring meter and  blood glucose logs during their next visit. Follow up plan: Return in about 3 months (around 04/23/2017) for meter, and logs.  Glade Lloyd, MD Phone: [phone]  Fax: [phone]   01/22/2017, 11:19 AM

## 2017-02-13 ENCOUNTER — Other Ambulatory Visit: Payer: Self-pay | Admitting: Cardiovascular Disease

## 2017-02-14 ENCOUNTER — Telehealth: Payer: Self-pay | Admitting: *Deleted

## 2017-02-14 DIAGNOSIS — I10 Essential (primary) hypertension: Secondary | ICD-10-CM

## 2017-02-14 MED ORDER — CHLORTHALIDONE 25 MG PO TABS: 25.0000 mg | ORAL_TABLET | Freq: Every day | ORAL | 6 refills | Status: DC

## 2017-02-14 NOTE — Telephone Encounter (Signed)
On 01/23/2017, stopped Amlodipine all together.  Got to feeling so bad she had to stop.  Stated she did try again on 5/7 at night.  Since then top number running 120-130's & bottom number 52-65.  Heart rate 80's.  States she is not feeling good.  Noticing some dizziness still.  No SOB.  Last 2 nights has had some discomfort in chest area, pressure.  States that she feels that she has been having some burring of her vision x 3-4 days.  Does state that her sugar has been good as well.   136/57   84  At 9:00 am

## 2017-02-14 NOTE — Telephone Encounter (Signed)
Patient notified.  She will stop the Amlodipine.  Will send new Chlorthalidone to [facility] now.  She will do lab in 7-10 days at [facility] across from [facility].

## 2017-02-14 NOTE — Telephone Encounter (Signed)
Stop amlodipine. Will try chlorthalidone 25 mg daily to treat hypertension. Check BMET one week after starting.

## 2017-02-16 ENCOUNTER — Other Ambulatory Visit: Payer: Self-pay | Admitting: "Endocrinology

## 2017-02-17 ENCOUNTER — Telehealth: Payer: Self-pay | Admitting: Cardiovascular Disease

## 2017-02-17 NOTE — Telephone Encounter (Signed)
Patient started taking chlorthalidone on Saturday took it on Sunday as well, but not today. Patient stated Sunday she felt very light headed, dizzy and legs were weak. Patient stated she almost passed out.  110/53 Wednesday 130/54 Friday 136/54 Saturday 166/57 Sunday 120/56 Today Patient was taking 40mg  of Enalapril and 75 of Lopressor at last office visit and wants to go back to this. Patient states she felt great during this time.

## 2017-02-17 NOTE — Telephone Encounter (Signed)
Recent change in BP medication she thinks is causign her BP to be too low.  She said she feels horrible diastolic staying around 50-72

## 2017-02-17 NOTE — Telephone Encounter (Signed)
Patient notified and verbalized understanding. 

## 2017-02-17 NOTE — Telephone Encounter (Signed)
That would be fine, although there are no low BP's on Sunday to suggest why she would have "almost passed out".

## 2017-03-17 ENCOUNTER — Encounter: Payer: Self-pay | Admitting: *Deleted

## 2017-03-17 ENCOUNTER — Other Ambulatory Visit: Payer: Self-pay | Admitting: "Endocrinology

## 2017-03-19 ENCOUNTER — Other Ambulatory Visit: Payer: Self-pay

## 2017-04-08 ENCOUNTER — Ambulatory Visit (INDEPENDENT_AMBULATORY_CARE_PROVIDER_SITE_OTHER): Payer: Medicare Other | Admitting: Cardiovascular Disease

## 2017-04-08 ENCOUNTER — Encounter: Payer: Self-pay | Admitting: Cardiovascular Disease

## 2017-04-08 VITALS — BP 155/66 | HR 78 | Ht 66.0 in | Wt 150.6 lb

## 2017-04-08 DIAGNOSIS — R9431 Abnormal electrocardiogram [ECG] [EKG]: Secondary | ICD-10-CM | POA: Diagnosis not present

## 2017-04-08 DIAGNOSIS — Z8249 Family history of ischemic heart disease and other diseases of the circulatory system: Secondary | ICD-10-CM

## 2017-04-08 DIAGNOSIS — R0602 Shortness of breath: Secondary | ICD-10-CM

## 2017-04-08 DIAGNOSIS — R072 Precordial pain: Secondary | ICD-10-CM | POA: Diagnosis not present

## 2017-04-08 DIAGNOSIS — I10 Essential (primary) hypertension: Secondary | ICD-10-CM | POA: Diagnosis not present

## 2017-04-08 NOTE — Progress Notes (Signed)
SUBJECTIVE: The patient presents for follow-up of chest pain, shortness of breath, and hypertension.  Stress test results 11/22/15:  Abnormal ST segment depression of 1.5 mm in leads II, III, aVF, and V5 through V6 at peak heart rate. Chest pain was also reported. Hypertensive response noted. Moderate risk Duke treadmill score of -5.  Blood pressure demonstrated a hypertensive response to exercise.  Small, mild intensity, fixed apical anteroseptal defect that is most consistent with breast attenuation. No clear evidence of ischemia.  This is a low risk study based on perfusion imaging although ECG abnormalities suggest moderately increased risk based on Duke treadmill score. Could be "false positive" result. Would correlate clinically.                              Nuclear stress EF: 78%.  I started amlodipine at her last visit but she stopped it due to medication intolerance. I then tried chlorthalidone 25 mg. She stopped this as well and went back to enalapril and metoprolol as per her preference.  She denies exertional chest pain and dyspnea. She experienced dizziness with both amlodipine and chlorthalidone. She maintained a detailed blood pressure log which I reviewed.  Systolic blood pressures are quite consistently ranging from 140-175.  We had a long discussion about trying to initiate very low dose amlodipine again to reduce her blood pressure.   ECG which I ordered in person interpreted demonstrated sinus rhythm with nonspecific ST segment and T-wave abnormalities.  Review of Systems: As per "subjective", otherwise negative.  Allergies  Allergen Reactions  . Advair Diskus [Fluticasone-Salmeterol] Anaphylaxis  . Codeine Nausea And Vomiting  . Hydrocodone-Acetaminophen Nausea And Vomiting  . Oxycodone-Acetaminophen Nausea And Vomiting  . Tetracyclines & Related Itching    Internal    Current Outpatient Prescriptions  Medication Sig Dispense Refill  .  Aspirin-Salicylamide-Caffeine (BC HEADACHE) 325-95-16 MG TABS Take 0.5 packets by mouth daily as needed (FOR PAIN).    . BD PEN NEEDLE NANO U/F 32G X 4 MM MISC USE EVERY DAY AS DIRECTED 100 each 3  . colesevelam (WELCHOL) 625 MG tablet Take by mouth daily with breakfast.    . enalapril (VASOTEC) 20 MG tablet Take 2 tablets (40 mg total) by mouth daily. 60 tablet 6  . LEVEMIR FLEXTOUCH 100 UNIT/ML Pen INJECT 50 UNITS INTO THE SKIN AT BEDTIME 45 mL 0  . levothyroxine (SYNTHROID, LEVOTHROID) 75 MCG tablet TAKE 1 TABLET (75 MCG TOTAL) BY MOUTH DAILY BEFORE BREAKFAST. 90 tablet 1  . metFORMIN (GLUCOPHAGE) 1000 MG tablet TAKE 1 TABLET (1,000 MG TOTAL) BY MOUTH 2 (TWO) TIMES DAILY WITH A MEAL. 180 tablet 1  . metoprolol tartrate (LOPRESSOR) 50 MG tablet TAKE 1.5 TABLETS (75 MG TOTAL) BY MOUTH 2 (TWO) TIMES DAILY. 270 tablet 1  . montelukast (SINGULAIR) 10 MG tablet Take 10 mg by mouth at bedtime.     . ONE TOUCH ULTRA TEST test strip TEST TWICE A DAY AS DIRECTED 200 each 5  . ONETOUCH DELICA LANCETS 30Q MISC Use as instructed bid E11.65 100 each 3  . pantoprazole (PROTONIX) 40 MG tablet Take 40 mg by mouth daily.    . traMADol (ULTRAM) 50 MG tablet Take 1 tablet by mouth as needed.     No current facility-administered medications for this visit.     Past Medical History:  Diagnosis Date  . Arthritis   . Asthma    takes Singulair nightly  .  Diabetes mellitus    takes levemir daily and Metformin daily  . Generalized headaches   . GERD (gastroesophageal reflux disease)    takes Omeprazole daily  . Herniated disc, cervical   . History of kidney stones   . Hyperlipidemia    takes Welchol daily  . Hypertension    takes Enalapril dialy  . Hypothyroidism    takes Synthroid daily  . IBS (irritable bowel syndrome)   . Joint pain    hands and neck  . Nocturia   . PONV (postoperative nausea and vomiting)   . Seizures ([place])    denies  . TIA (transient ischemic attack)    diagnosed by EEG but  never knew she had it  . Trigger finger   . Weakness   . Weakness    in both hands    Past Surgical History:  Procedure Laterality Date  . ABDOMINAL HYSTERECTOMY  1980  . ANTERIOR CERVICAL DECOMP/DISCECTOMY FUSION N/A 10/18/2013   Procedure: Cervical three-four, Cervical four-five, Cervical seven-Thoracic one anterior cervical decompression with fusion plating and bonegraft;  Surgeon: Hosie Spangle, MD;  Location: [facility];  Service: Neurosurgery;  Laterality: N/A;  Cervical three-four, Cervical four-five, Cervical seven-Thoracic one anterior cervical decompression with fusion plating and bonegraft  . APPENDECTOMY     done with hysterectomy  . BACK SURGERY  2007   lumbar fusion  . carpel tunnel Bilateral    left first then right  . cataract implants    . COLONOSCOPY    . ESOPHAGOGASTRODUODENOSCOPY     hx of h pylori  . EYE SURGERY  11/18/2008   right - cataracts  . EYE SURGERY  06/01/2009   left - cataracts  . herniated disc   10/2000, 08/31/2008   C5-C6 then C6-C7  . knee anthroscopy bilateral Bilateral   . KNEE ARTHROSCOPY    . LITHOTRIPSY  11/12/2007   right side  . Parathyroid surgery     October 2013  . PARATHYROIDECTOMY  07/02/2012   Procedure: PARATHYROIDECTOMY;  Surgeon: Earnstine Regal, MD;  Location: [facility];  Service: General;  Laterality: N/A;  . TONSILLECTOMY     as a child    Social History   Social History  . Marital status: Married    Spouse name: N/A  . Number of children: N/A  . Years of education: N/A   Occupational History  . Not on file.   Social History Main Topics  . Smoking status: Former Smoker    Packs/day: 1.00    Years: 40.00    Quit date: 07/19/2012  . Smokeless tobacco: Never Used     Comment: quit smoking in Oct 2013  . Alcohol use No  . Drug use: No  . Sexual activity: Not on file   Other Topics Concern  . Not on file   Social History Narrative  . No narrative on file     Vitals:   04/08/17 1035  BP: (!) 155/66    Pulse: 78  Weight: 150 lb 9.6 oz (68.3 kg)  Height: 5\' 6"  (1.676 m)    Wt Readings from Last 3 Encounters:  04/08/17 150 lb 9.6 oz (68.3 kg)  01/22/17 148 lb (67.1 kg)  01/16/17 142 lb (64.4 kg)     PHYSICAL EXAM General: NAD HEENT: Normal. Neck: No JVD, no thyromegaly. Lungs: Clear to auscultation bilaterally with normal respiratory effort. CV: Nondisplaced PMI.  Regular rate and rhythm, normal S1/S2, no S3/S4, no murmur. No pretibial or periankle edema.  No carotid bruit.   Abdomen: Soft, nontender, no distention.  Neurologic: Alert and oriented.  Psych: Normal affect. Skin: Normal. Musculoskeletal: No gross deformities.    ECG: Most recent ECG reviewed.   Labs: Lab Results  Component Value Date/Time   K 5.0 01/14/2017 08:05 AM   BUN 14 01/14/2017 08:05 AM   CREATININE 0.80 01/14/2017 08:05 AM   ALT 18 01/14/2017 08:05 AM   TSH 1.98 10/14/2016 07:21 AM   HGB 12.4 04/05/2016 04:16 PM     Lipids: Lab Results  Component Value Date/Time   LDLCALC 96 10/14/2016 07:21 AM   CHOL 156 10/14/2016 07:21 AM   TRIG 163 (H) 10/14/2016 07:21 AM   HDL 27 (L) 10/14/2016 07:21 AM       ASSESSMENT AND PLAN:  1. Chest pain and shortness of breath: Stress test reviewed above in detail. Symptomatically stable.  2. Essential hypertension: Blood pressure remains elevated. Did not tolerate amlodipine or chlorthalidone. She preferred to be on enalapril and metoprolol. I had a very long discussion with her after carefully reviewing her blood pressure log. I will try very low-dose amlodipine 2.5 mg daily for 2 weeks to allow this medication to achieve equilibrium in her system. I will then increase it to 5 mg daily. I've asked her to maintain checking her blood pressure 4 times per week. I emphasized the importance of blood pressure control for the primary prevention of cardiovascular and renal disease.  Disposition: Follow up 6 weeks   Kate Sable, M.D., F.A.C.C.

## 2017-04-08 NOTE — Patient Instructions (Addendum)
Your physician recommends that you schedule a follow-up appointment in: [place] physician has recommended you make the following change in your medication:   TAKE AMLODIPINE 2.5 MG DAILY FOR 2 WEEKS  THEN INCREASE AMLODIPINE 5 MG DAILY  CONTINUE TO MONITOR YOUR BLOOD PRESSURE DAILY AND BRING YOUR READINGS TO YOUR NEXT OFFICE VISIT  Thank you for choosing [facility]!!

## 2017-04-12 ENCOUNTER — Other Ambulatory Visit: Payer: Self-pay | Admitting: "Endocrinology

## 2017-04-14 ENCOUNTER — Other Ambulatory Visit: Payer: Self-pay | Admitting: Cardiovascular Disease

## 2017-04-14 MED ORDER — AMLODIPINE BESYLATE 5 MG PO TABS: 5.0000 mg | ORAL_TABLET | Freq: Every day | ORAL | 0 refills | Status: DC

## 2017-04-14 NOTE — Telephone Encounter (Signed)
Medication sent to pharmacy  

## 2017-04-14 NOTE — Telephone Encounter (Signed)
Patient needs refill on Amlodipine 5mg   Please call [facility]  [place]

## 2017-04-16 ENCOUNTER — Other Ambulatory Visit: Payer: Self-pay | Admitting: "Endocrinology

## 2017-04-16 DIAGNOSIS — Z794 Long term (current) use of insulin: Secondary | ICD-10-CM | POA: Diagnosis not present

## 2017-04-16 DIAGNOSIS — E1165 Type 2 diabetes mellitus with hyperglycemia: Secondary | ICD-10-CM | POA: Diagnosis not present

## 2017-04-16 DIAGNOSIS — E039 Hypothyroidism, unspecified: Secondary | ICD-10-CM | POA: Diagnosis not present

## 2017-04-16 DIAGNOSIS — R7309 Other abnormal glucose: Secondary | ICD-10-CM | POA: Diagnosis not present

## 2017-04-16 DIAGNOSIS — Z6829 Body mass index (BMI) 29.0-29.9, adult: Secondary | ICD-10-CM | POA: Diagnosis not present

## 2017-04-16 DIAGNOSIS — Z0000 Encounter for general adult medical examination without abnormal findings: Secondary | ICD-10-CM | POA: Diagnosis not present

## 2017-04-16 DIAGNOSIS — E663 Overweight: Secondary | ICD-10-CM | POA: Diagnosis not present

## 2017-04-16 LAB — COMPREHENSIVE METABOLIC PANEL
ALBUMIN: 3.9 g/dL (ref 3.6–5.1)
ALK PHOS: 60 U/L (ref 33–130)
ALT: 23 U/L (ref 6–29)
AST: 19 U/L (ref 10–35)
BILIRUBIN TOTAL: 0.4 mg/dL (ref 0.2–1.2)
BUN: 15 mg/dL (ref 7–25)
CALCIUM: 9 mg/dL (ref 8.6–10.4)
CO2: 28 mmol/L (ref 20–31)
Chloride: 104 mmol/L (ref 98–110)
Creat: 0.81 mg/dL (ref 0.50–0.99)
Glucose, Bld: 122 mg/dL — ABNORMAL HIGH (ref 65–99)
Potassium: 4.6 mmol/L (ref 3.5–5.3)
Sodium: 141 mmol/L (ref 135–146)
TOTAL PROTEIN: 6 g/dL — AB (ref 6.1–8.1)

## 2017-04-16 LAB — TSH: TSH: 2.66 m[IU]/L

## 2017-04-16 LAB — T4, FREE: FREE T4: 1.4 ng/dL (ref 0.8–1.8)

## 2017-04-17 LAB — HEMOGLOBIN A1C
HEMOGLOBIN A1C: 6.5 % — AB (ref <5.7)
Mean Plasma Glucose: 140 mg/dL

## 2017-04-24 ENCOUNTER — Ambulatory Visit (INDEPENDENT_AMBULATORY_CARE_PROVIDER_SITE_OTHER): Payer: Medicare Other | Admitting: "Endocrinology

## 2017-04-24 ENCOUNTER — Encounter: Payer: Self-pay | Admitting: "Endocrinology

## 2017-04-24 VITALS — BP 129/64 | HR 74 | Ht 66.0 in | Wt 151.0 lb

## 2017-04-24 DIAGNOSIS — Z794 Long term (current) use of insulin: Secondary | ICD-10-CM | POA: Diagnosis not present

## 2017-04-24 DIAGNOSIS — I10 Essential (primary) hypertension: Secondary | ICD-10-CM

## 2017-04-24 DIAGNOSIS — E039 Hypothyroidism, unspecified: Secondary | ICD-10-CM | POA: Diagnosis not present

## 2017-04-24 DIAGNOSIS — E1165 Type 2 diabetes mellitus with hyperglycemia: Secondary | ICD-10-CM | POA: Diagnosis not present

## 2017-04-24 DIAGNOSIS — E782 Mixed hyperlipidemia: Secondary | ICD-10-CM

## 2017-04-24 DIAGNOSIS — IMO0001 Reserved for inherently not codable concepts without codable children: Secondary | ICD-10-CM

## 2017-04-24 MED ORDER — INSULIN DETEMIR 100 UNIT/ML FLEXPEN: PEN_INJECTOR | SUBCUTANEOUS | 0 refills | Status: DC

## 2017-04-24 NOTE — Patient Instructions (Signed)

## 2017-04-24 NOTE — Progress Notes (Signed)
Subjective:    Patient ID: [name], female    DOB: [birth year],    Past Medical History:  Diagnosis Date  . Arthritis   . Asthma    takes Singulair nightly  . Diabetes mellitus    takes levemir daily and Metformin daily  . Generalized headaches   . GERD (gastroesophageal reflux disease)    takes Omeprazole daily  . Herniated disc, cervical   . History of kidney stones   . Hyperlipidemia    takes Welchol daily  . Hypertension    takes Enalapril dialy  . Hypothyroidism    takes Synthroid daily  . IBS (irritable bowel syndrome)   . Joint pain    hands and neck  . Nocturia   . PONV (postoperative nausea and vomiting)   . Seizures ([place])    denies  . TIA (transient ischemic attack)    diagnosed by EEG but never knew she had it  . Trigger finger   . Weakness   . Weakness    in both hands   Past Surgical History:  Procedure Laterality Date  . ABDOMINAL HYSTERECTOMY  1980  . ANTERIOR CERVICAL DECOMP/DISCECTOMY FUSION N/A 10/18/2013   Procedure: Cervical three-four, Cervical four-five, Cervical seven-Thoracic one anterior cervical decompression with fusion plating and bonegraft;  Surgeon: Hosie Spangle, MD;  Location: [facility];  Service: Neurosurgery;  Laterality: N/A;  Cervical three-four, Cervical four-five, Cervical seven-Thoracic one anterior cervical decompression with fusion plating and bonegraft  . APPENDECTOMY     done with hysterectomy  . BACK SURGERY  2007   lumbar fusion  . carpel tunnel Bilateral    left first then right  . cataract implants    . COLONOSCOPY    . ESOPHAGOGASTRODUODENOSCOPY     hx of h pylori  . EYE SURGERY  11/18/2008   right - cataracts  . EYE SURGERY  06/01/2009   left - cataracts  . herniated disc   10/2000, 08/31/2008   C5-C6 then C6-C7  . knee anthroscopy bilateral Bilateral   . KNEE ARTHROSCOPY    . LITHOTRIPSY  11/12/2007   right side  . Parathyroid surgery     October 2013  . PARATHYROIDECTOMY  07/02/2012   Procedure: PARATHYROIDECTOMY;  Surgeon: Earnstine Regal, MD;  Location: [facility];  Service: General;  Laterality: N/A;  . TONSILLECTOMY     as a child   Social History   Social History  . Marital status: Married    Spouse name: N/A  . Number of children: N/A  . Years of education: N/A   Social History Main Topics  . Smoking status: Former Smoker    Packs/day: 1.00    Years: 40.00    Quit date: 07/19/2012  . Smokeless tobacco: Never Used     Comment: quit smoking in Oct 2013  . Alcohol use No  . Drug use: No  . Sexual activity: Not Asked   Other Topics Concern  . None   Social History Narrative  . None   Outpatient Encounter Prescriptions as of 04/24/2017  Medication Sig  . amLODipine (NORVASC) 5 MG tablet Take 1 tablet (5 mg total) by mouth daily.  . Aspirin-Salicylamide-Caffeine (BC HEADACHE) 325-95-16 MG TABS Take 0.5 packets by mouth daily as needed (FOR PAIN).  . BD PEN NEEDLE NANO U/F 32G X 4 MM MISC USE EVERY DAY AS DIRECTED  . colesevelam (WELCHOL) 625 MG tablet Take by mouth 2 (two) times daily with a meal.   .  enalapril (VASOTEC) 20 MG tablet Take 2 tablets (40 mg total) by mouth daily.  . Insulin Detemir (LEVEMIR FLEXTOUCH) 100 UNIT/ML Pen INJECT 30 UNITS INTO THE SKIN AT BEDTIME  . levothyroxine (SYNTHROID, LEVOTHROID) 75 MCG tablet TAKE 1 TABLET (75 MCG TOTAL) BY MOUTH DAILY BEFORE BREAKFAST.  . metFORMIN (GLUCOPHAGE) 1000 MG tablet TAKE 1 TABLET (1,000 MG TOTAL) BY MOUTH 2 (TWO) TIMES DAILY WITH A MEAL.  . metoprolol tartrate (LOPRESSOR) 50 MG tablet TAKE 1.5 TABLETS (75 MG TOTAL) BY MOUTH 2 (TWO) TIMES DAILY.  . montelukast (SINGULAIR) 10 MG tablet Take 10 mg by mouth at bedtime.   . ONE TOUCH ULTRA TEST test strip TEST TWICE A DAY AS DIRECTED E11.65  . ONETOUCH DELICA LANCETS 64W MISC Use as instructed bid E11.65  . pantoprazole (PROTONIX) 40 MG tablet Take 40 mg by mouth daily.  . traMADol (ULTRAM) 50 MG tablet Take 1 tablet by mouth as needed.  .  [DISCONTINUED] LEVEMIR FLEXTOUCH 100 UNIT/ML Pen INJECT 50 UNITS INTO THE SKIN AT BEDTIME   No facility-administered encounter medications on file as of 04/24/2017.    ALLERGIES: Allergies  Allergen Reactions  . Advair Diskus [Fluticasone-Salmeterol] Anaphylaxis  . Codeine Nausea And Vomiting  . Hydrocodone-Acetaminophen Nausea And Vomiting  . Oxycodone-Acetaminophen Nausea And Vomiting  . Tetracyclines & Related Itching    Internal   VACCINATION STATUS: Immunization History  Administered Date(s) Administered  . Tdap 07/12/2014    Diabetes  She presents for her follow-up diabetic visit. She has type 2 diabetes mellitus. Onset time: Diagnosed approximately at age  12 years. Her disease course has been stable. Pertinent negatives for hypoglycemia include no confusion, headaches, pallor or seizures. Associated symptoms include fatigue. Pertinent negatives for diabetes include no chest pain, no polydipsia, no polyphagia and no polyuria. Symptoms are stable. There are no diabetic complications. Risk factors for coronary artery disease include tobacco exposure, hypertension and dyslipidemia. Current diabetic treatment includes insulin injections and oral agent (monotherapy). She is compliant with treatment most of the time. Her weight is stable. She is following a generally unhealthy diet. Her breakfast blood glucose range is generally 130-140 mg/dl. An ACE inhibitor/angiotensin II receptor blocker is being taken.  Hyperlipidemia  This is a chronic problem. The current episode started more than 1 year ago. The problem is controlled. Pertinent negatives include no chest pain, myalgias or shortness of breath. Current antihyperlipidemic treatment includes bile acid squestrants. Risk factors for coronary artery disease include dyslipidemia, diabetes mellitus, hypertension and a sedentary lifestyle.  Hypertension  This is a chronic problem. The current episode started more than 1 year ago. The problem  is controlled. Pertinent negatives include no chest pain, headaches, palpitations or shortness of breath. Past treatments include ACE inhibitors.     Review of Systems  Constitutional: Positive for fatigue. Negative for unexpected weight change.  HENT: Negative for trouble swallowing and voice change.   Eyes: Negative for visual disturbance.  Respiratory: Negative for cough, shortness of breath and wheezing.   Cardiovascular: Negative for chest pain, palpitations and leg swelling.  Gastrointestinal: Negative for diarrhea, nausea and vomiting.  Endocrine: Negative for cold intolerance, heat intolerance, polydipsia, polyphagia and polyuria.  Musculoskeletal: Negative for arthralgias and myalgias.  Skin: Negative for color change, pallor, rash and wound.  Neurological: Negative for seizures and headaches.  Psychiatric/Behavioral: Negative for confusion and suicidal ideas.    Objective:    BP 129/64   Pulse 74   Ht 5\' 6"  (1.676 m)   Wt 151 lb (  68.5 kg)   BMI 24.37 kg/m   Wt Readings from Last 3 Encounters:  04/24/17 151 lb (68.5 kg)  04/08/17 150 lb 9.6 oz (68.3 kg)  01/22/17 148 lb (67.1 kg)    Physical Exam  Constitutional: She is oriented to person, place, and time. She appears well-developed.  HENT:  Head: Normocephalic and atraumatic.  Eyes: EOM are normal.  Neck: Normal range of motion. Neck supple. No tracheal deviation present. No thyromegaly present.  Cardiovascular: Normal rate and regular rhythm.   Pulmonary/Chest: Effort normal and breath sounds normal.  Abdominal: Soft. Bowel sounds are normal. There is no tenderness. There is no guarding.  Musculoskeletal: Normal range of motion. She exhibits no edema.  Neurological: She is alert and oriented to person, place, and time. She has normal reflexes. No cranial nerve deficit. Coordination normal.  Skin: Skin is warm and dry. No rash noted. No erythema. No pallor.  Psychiatric: She has a normal mood and affect. Judgment  normal.    Results for orders placed or performed in visit on 04/16/17  Comprehensive metabolic panel  Result Value Ref Range   Sodium 141 135 - 146 mmol/L   Potassium 4.6 3.5 - 5.3 mmol/L   Chloride 104 98 - 110 mmol/L   CO2 28 20 - 31 mmol/L   Glucose, Bld 122 (H) 65 - 99 mg/dL   BUN 15 7 - 25 mg/dL   Creat 0.81 0.50 - 0.99 mg/dL   Total Bilirubin 0.4 0.2 - 1.2 mg/dL   Alkaline Phosphatase 60 33 - 130 U/L   AST 19 10 - 35 U/L   ALT 23 6 - 29 U/L   Total Protein 6.0 (L) 6.1 - 8.1 g/dL   Albumin 3.9 3.6 - 5.1 g/dL   Calcium 9.0 8.6 - 10.4 mg/dL  TSH  Result Value Ref Range   TSH 2.66 mIU/L  T4, free  Result Value Ref Range   Free T4 1.4 0.8 - 1.8 ng/dL  Hemoglobin A1c  Result Value Ref Range   Hgb A1c MFr Bld 6.5 (H) <5.7 %   Mean Plasma Glucose 140 mg/dL   Complete Blood Count (Most recent): Lab Results  Component Value Date   WBC 7.4 04/05/2016   HGB 12.4 04/05/2016   HCT 39.1 04/05/2016   MCV 79.6 04/05/2016   PLT 233 04/05/2016   Chemistry (most recent): Lab Results  Component Value Date   NA 141 04/16/2017   K 4.6 04/16/2017   CL 104 04/16/2017   CO2 28 04/16/2017   BUN 15 04/16/2017   CREATININE 0.81 04/16/2017   Diabetic Labs (most recent): Lab Results  Component Value Date   HGBA1C 6.5 (H) 04/16/2017   HGBA1C 6.1 (H) 01/14/2017   HGBA1C 6.2 (H) 10/14/2016    Assessment & Plan:   1. Uncontrolled type 2 diabetes mellitus without complication, with long-term current use of insulin ([place])   Patient came with target glucose profile, and  recent A1c of 6.5% overall improving from 11.5 %.  Glucose logs and insulin administration records pertaining to this visit,  to be scanned into patient's records.  Recent labs reviewed. -Patient remains at a high risk for more acute and chronic complications of diabetes which include CAD, CVA, CKD, retinopathy, and neuropathy. These are all discussed in detail with the patient. -I have re-counseled the patient on  diet management and weight loss, by adopting a carbohydrate restricted / protein rich  Diet. Patient is advised to stick to a routine mealtimes to eat  3 meals  a day and avoid unnecessary snacks ( to snack only to correct hypoglycemia).  -Suggestion is made for patient to avoid simple carbohydrates   from her  diet including Cakes , Desserts, Ice Cream,  Soda (  diet and regular) , Sweet Tea , Candies,  Chips, Cookies, Artificial Sweeteners,   and "Sugar-free" Products .  This will help patient to have stable blood glucose profile and potentially lose weight.  -The patient is provided with individualized DM education. -I have approached patient to continue on  intensive monitoring of blood glucose and  Therapy as follows, and patient agrees.  - I will lower Levemir to 30 units daily at bedtime, associated with strict monitoring of glucose  every morning,  and as needed.  -Patient is encouraged to call clinic for blood glucose levels less than 70 or above 300 mg /dl.  - I will continue metformin 1000 mg by mouth twice a day, therapeutically suitable for patient..   - Patient specific target  for A1c; LDL, HDL, Triglycerides, and  Waist Circumference were discussed in detail.  2) BP/HTN: unontrolled.  Continue current medications including ACEI, amlodipine was recently added by her PMD. I have advised her to measure her blood pressure at home, report if readings are above 140/90. 3) Lipids/HPL: Intolerant to statins .continue WelChol   4)  Weight/Diet: exercise, and carbohydrates information provided.  5) hypothyroidism:  Her thyroid function tests are consistent with appropriate replacement. I will continue levothyroxine 75 g by mouth every morning.  - We discussed about correct intake of levothyroxine, at fasting, with water, separated by at least 30 minutes from breakfast, and separated by more than 4 hours from calcium, iron, multivitamins, acid reflux medications (PPIs). -Patient is made  aware of the fact that thyroid hormone replacement is needed for life, dose to be adjusted by periodic monitoring of thyroid function tests.  6) Chronic Care/Health Maintenance: -Patient on ACEI medications and encouraged to continue to follow up with Ophthalmology, Podiatrist at least yearly or according to recommendations, and advised to quit smoking. I have recommended yearly flu vaccine and pneumonia vaccination at least every 5 years; moderate intensity exercise for up to 150 minutes weekly; and  sleep for at least 7 hours a day.  7) history of hyperparathyroidism: She is status post parathyroidectomy. Calcium is stable and normal at 9.1.    Patient to bring meter and  blood glucose logs during her next visit. Follow up plan: Return in about 4 months (around 08/25/2017) for follow up with pre-visit labs, meter, and logs.  Glade Lloyd, MD Phone: [phone]  Fax: [phone]   04/24/2017, 12:11 PM

## 2017-05-21 ENCOUNTER — Other Ambulatory Visit: Payer: Self-pay | Admitting: "Endocrinology

## 2017-05-21 ENCOUNTER — Ambulatory Visit: Payer: Medicare Other | Admitting: Cardiovascular Disease

## 2017-05-22 ENCOUNTER — Other Ambulatory Visit: Payer: Self-pay | Admitting: "Endocrinology

## 2017-05-24 ENCOUNTER — Other Ambulatory Visit: Payer: Self-pay | Admitting: Cardiovascular Disease

## 2017-05-26 ENCOUNTER — Ambulatory Visit (INDEPENDENT_AMBULATORY_CARE_PROVIDER_SITE_OTHER): Payer: Medicare Other | Admitting: Cardiovascular Disease

## 2017-05-26 ENCOUNTER — Encounter: Payer: Self-pay | Admitting: Cardiovascular Disease

## 2017-05-26 VITALS — BP 138/50 | HR 102 | Ht 66.0 in | Wt 153.0 lb

## 2017-05-26 DIAGNOSIS — I10 Essential (primary) hypertension: Secondary | ICD-10-CM | POA: Diagnosis not present

## 2017-05-26 DIAGNOSIS — R072 Precordial pain: Secondary | ICD-10-CM

## 2017-05-26 DIAGNOSIS — Z8249 Family history of ischemic heart disease and other diseases of the circulatory system: Secondary | ICD-10-CM | POA: Diagnosis not present

## 2017-05-26 DIAGNOSIS — R0602 Shortness of breath: Secondary | ICD-10-CM | POA: Diagnosis not present

## 2017-05-26 MED ORDER — AMLODIPINE BESYLATE 5 MG PO TABS: 7.5000 mg | ORAL_TABLET | Freq: Every day | ORAL | Status: DC

## 2017-05-26 NOTE — Patient Instructions (Addendum)
Medication Instructions:   Increase Amlodipine to 7.5mg  daily.    After 5 days of being on the 7.5mg  daily dose, begin monitoring your blood pressure.  If consistently greater than 140 (top number), increase Amlodipine to 10mg  daily.   Continue all other medications.    Labwork: none  Testing/Procedures: none  Follow-Up: 2 month   Any Other Special Instructions Will Be Listed Below (If Applicable). Your physician has requested that you regularly monitor and record your blood pressure readings at home. Please take 3 x week for 2 weeks.  Return readings to office for MD review.    If you need a refill on your cardiac medications before your next appointment, please call your pharmacy.

## 2017-05-26 NOTE — Progress Notes (Signed)
SUBJECTIVE: The patient presents for follow-up of chest pain, shortness of breath, and hypertension.  Stress test results 11/22/15:  Abnormal ST segment depression of 1.5 mm in leads II, III, aVF, and V5 through V6 at peak heart rate. Chest pain was also reported. Hypertensive response noted. Moderate risk Duke treadmill score of -5.  Blood pressure demonstrated a hypertensive response to exercise.  Small, mild intensity, fixed apical anteroseptal defect that is most consistent with breast attenuation. No clear evidence of ischemia.  This is a low risk study based on perfusion imaging although ECG abnormalities suggest moderately increased risk based on Duke treadmill score. Could be "false positive" result. Would correlate clinically. Nuclear stress EF: 78%.  She denies chest pain and shortness of breath. She also denies orthopnea and leg swelling. She has started line dancing for 1-1/2 hours per week at the [facility].  I have reviewed her blood pressure log and systolic readings have consistently been higher than 140. She has tolerated amlodipine.     Review of Systems: As per "subjective", otherwise negative.  Allergies  Allergen Reactions  . Advair Diskus [Fluticasone-Salmeterol] Anaphylaxis  . Codeine Nausea And Vomiting  . Hydrocodone-Acetaminophen Nausea And Vomiting  . Oxycodone-Acetaminophen Nausea And Vomiting  . Tetracyclines & Related Itching    Internal    Current Outpatient Prescriptions  Medication Sig Dispense Refill  . amLODipine (NORVASC) 5 MG tablet Take 1 tablet (5 mg total) by mouth daily. 90 tablet 0  . Aspirin-Salicylamide-Caffeine (BC HEADACHE) 325-95-16 MG TABS Take 0.5 packets by mouth daily as needed (FOR PAIN).    . BD PEN NEEDLE NANO U/F 32G X 4 MM MISC USE EVERY DAY AS DIRECTED 100 each 3  . enalapril (VASOTEC) 20 MG tablet TAKE 2 TABLETS (40 MG TOTAL) BY MOUTH DAILY. 180 tablet 1  . Insulin  Detemir (LEVEMIR FLEXTOUCH) 100 UNIT/ML Pen INJECT 30 UNITS INTO THE SKIN AT BEDTIME 45 mL 0  . levothyroxine (SYNTHROID, LEVOTHROID) 75 MCG tablet TAKE 1 TABLET (75 MCG TOTAL) BY MOUTH DAILY BEFORE BREAKFAST. 90 tablet 1  . metFORMIN (GLUCOPHAGE) 1000 MG tablet TAKE 1 TABLET (1,000 MG TOTAL) BY MOUTH 2 (TWO) TIMES DAILY WITH A MEAL. 180 tablet 1  . metoprolol tartrate (LOPRESSOR) 50 MG tablet TAKE 1.5 TABLETS (75 MG TOTAL) BY MOUTH 2 (TWO) TIMES DAILY. 270 tablet 1  . montelukast (SINGULAIR) 10 MG tablet Take 10 mg by mouth at bedtime.     . ONE TOUCH ULTRA TEST test strip TEST TWICE A DAY AS DIRECTED E11.65 200 each 1  . ONETOUCH DELICA LANCETS 22W MISC Use as instructed bid E11.65 100 each 3  . pantoprazole (PROTONIX) 40 MG tablet Take 40 mg by mouth daily.    . traMADol (ULTRAM) 50 MG tablet Take 1 tablet by mouth as needed.    Earnestine Mealing 625 MG tablet TAKE 1 TABLET BY MOUTH TWICE A DAY 180 tablet 1   No current facility-administered medications for this visit.     Past Medical History:  Diagnosis Date  . Arthritis   . Asthma    takes Singulair nightly  . Diabetes mellitus    takes levemir daily and Metformin daily  . Generalized headaches   . GERD (gastroesophageal reflux disease)    takes Omeprazole daily  . Herniated disc, cervical   . History of kidney stones   . Hyperlipidemia    takes Welchol daily  . Hypertension    takes Enalapril dialy  . Hypothyroidism  takes Synthroid daily  . IBS (irritable bowel syndrome)   . Joint pain    hands and neck  . Nocturia   . PONV (postoperative nausea and vomiting)   . Seizures ([place])    denies  . TIA (transient ischemic attack)    diagnosed by EEG but never knew she had it  . Trigger finger   . Weakness   . Weakness    in both hands    Past Surgical History:  Procedure Laterality Date  . ABDOMINAL HYSTERECTOMY  1980  . ANTERIOR CERVICAL DECOMP/DISCECTOMY FUSION N/A 10/18/2013   Procedure: Cervical three-four, Cervical  four-five, Cervical seven-Thoracic one anterior cervical decompression with fusion plating and bonegraft;  Surgeon: Hosie Spangle, MD;  Location: [facility];  Service: Neurosurgery;  Laterality: N/A;  Cervical three-four, Cervical four-five, Cervical seven-Thoracic one anterior cervical decompression with fusion plating and bonegraft  . APPENDECTOMY     done with hysterectomy  . BACK SURGERY  2007   lumbar fusion  . carpel tunnel Bilateral    left first then right  . cataract implants    . COLONOSCOPY    . ESOPHAGOGASTRODUODENOSCOPY     hx of h pylori  . EYE SURGERY  11/18/2008   right - cataracts  . EYE SURGERY  06/01/2009   left - cataracts  . herniated disc   10/2000, 08/31/2008   C5-C6 then C6-C7  . knee anthroscopy bilateral Bilateral   . KNEE ARTHROSCOPY    . LITHOTRIPSY  11/12/2007   right side  . Parathyroid surgery     October 2013  . PARATHYROIDECTOMY  07/02/2012   Procedure: PARATHYROIDECTOMY;  Surgeon: Earnstine Regal, MD;  Location: [facility];  Service: General;  Laterality: N/A;  . TONSILLECTOMY     as a child    Social History   Social History  . Marital status: Married    Spouse name: N/A  . Number of children: N/A  . Years of education: N/A   Occupational History  . Not on file.   Social History Main Topics  . Smoking status: Former Smoker    Packs/day: 1.00    Years: 40.00    Quit date: 07/19/2012  . Smokeless tobacco: Never Used     Comment: quit smoking in Oct 2013  . Alcohol use No  . Drug use: No  . Sexual activity: Not on file   Other Topics Concern  . Not on file   Social History Narrative  . No narrative on file     Vitals:   05/26/17 1411  BP: (!) 138/50  Pulse: (!) 102  SpO2: 97%  Weight: 153 lb (69.4 kg)  Height: 5\' 6"  (1.676 m)    Wt Readings from Last 3 Encounters:  05/26/17 153 lb (69.4 kg)  04/24/17 151 lb (68.5 kg)  04/08/17 150 lb 9.6 oz (68.3 kg)     PHYSICAL EXAM General: NAD HEENT: Normal. Neck: No JVD, no  thyromegaly. Lungs: Clear to auscultation bilaterally with normal respiratory effort. CV: Nondisplaced PMI.  Regular rate and rhythm, normal S1/S2, no S3/S4, no murmur. No pretibial or periankle edema.     Abdomen: Soft, nontender, no distention.  Neurologic: Alert and oriented.  Psych: Normal affect. Skin: Normal. Musculoskeletal: No gross deformities.    ECG: Most recent ECG reviewed.   Labs: Lab Results  Component Value Date/Time   K 4.6 04/16/2017 07:44 AM   BUN 15 04/16/2017 07:44 AM   CREATININE 0.81 04/16/2017 07:44 AM  ALT 23 04/16/2017 07:44 AM   TSH 2.66 04/16/2017 07:44 AM   HGB 12.4 04/05/2016 04:16 PM     Lipids: Lab Results  Component Value Date/Time   LDLCALC 96 10/14/2016 07:21 AM   CHOL 156 10/14/2016 07:21 AM   TRIG 163 (H) 10/14/2016 07:21 AM   HDL 27 (L) 10/14/2016 07:21 AM       ASSESSMENT AND PLAN:  1. Chest pain and shortness of breath: Stress test reviewed above in detail. Symptomatically stable.  2. Essential hypertension: Systolic blood pressure is mildly elevated. Did not tolerate chlorthalidone.  Currently taking amlodipine 5 mg and enalapril 40 mg. Also on metoprolol 75 mg twice daily. I have instructed her to start taking amlodipine 7.5 mg daily. I told her to monitor her blood pressure for the next few weeks. If systolic readings are consistently higher than 140, I will increase amlodipine to 10 mg daily.    Disposition: Follow up 2 months   Kate Sable, M.D., F.A.C.C.

## 2017-05-30 ENCOUNTER — Other Ambulatory Visit: Payer: Self-pay | Admitting: "Endocrinology

## 2017-05-30 MED ORDER — FENOFIBRATE 145 MG PO TABS: 145.0000 mg | ORAL_TABLET | Freq: Every day | ORAL | 3 refills | Status: DC

## 2017-06-09 NOTE — Telephone Encounter (Signed)
Patient called stating that she will need a refill on amLODipine (NORVASC) 5 MG tablet  [facility]   [place], [place]

## 2017-06-09 NOTE — Telephone Encounter (Signed)
Left message to return call on mobile number.

## 2017-06-09 NOTE — Telephone Encounter (Signed)
Left message to return call 

## 2017-06-10 DIAGNOSIS — H35363 Drusen (degenerative) of macula, bilateral: Secondary | ICD-10-CM | POA: Diagnosis not present

## 2017-06-10 MED ORDER — AMLODIPINE BESYLATE 10 MG PO TABS: 10.0000 mg | ORAL_TABLET | Freq: Every day | ORAL | 3 refills | Status: DC

## 2017-06-10 NOTE — Telephone Encounter (Signed)
Discussed Amlodipine with patient.  Stated that she has gone up to the 10mg  daily.  Will send new 90 script to [facility] now.  Follow up is scheduled for 07/29/2017.

## 2017-06-19 ENCOUNTER — Telehealth: Payer: Self-pay | Admitting: Cardiovascular Disease

## 2017-06-19 NOTE — Telephone Encounter (Signed)
Patient notified and verbalized understanding. 

## 2017-06-19 NOTE — Telephone Encounter (Signed)
Patient called stating that she has decreased her dosage of amlodipine 10 mg to 5mg .  States that it causing Her to stay dizzy.   Please call [phone]

## 2017-06-19 NOTE — Telephone Encounter (Signed)
Ok. Please ask her to continue to monitor BP 3-4 times per week and to let me know if SBP's are consistently >140 and/or DBP's consistently >90.

## 2017-06-25 ENCOUNTER — Telehealth: Payer: Self-pay | Admitting: Cardiovascular Disease

## 2017-06-25 NOTE — Telephone Encounter (Signed)
Patient called stating that she cannot tolerate the medication Norvasc 10 mg. She took her last pill on 06-18-17.  She states that she continues to have dizzy spells.   Please call [phone].

## 2017-06-26 ENCOUNTER — Other Ambulatory Visit: Payer: Self-pay | Admitting: "Endocrinology

## 2017-06-26 NOTE — Telephone Encounter (Signed)
Stated that she did go back to the Norvasc 5mg  daily - did that for about a week at night & still had dizzy spells.  Stopped altogether on 06-19-2017.  But still having the dizziness.  Could be with movement or at anytime with activity.  States that she is having a lot of sinus issues lately.  BP was not low during these dizzy spells.  Now, BP going back up since off medication.  No c/o fever, chest pain.  Does have follow up scheduled for 07/29/2017.  Message fwd to provider for further advice.

## 2017-06-26 NOTE — Telephone Encounter (Signed)
Dizziness may be related to sinus issues. Continue to monitor BP to see if additional medication adjustments are warranted.

## 2017-06-27 NOTE — Telephone Encounter (Signed)
No answer

## 2017-07-01 DIAGNOSIS — Z23 Encounter for immunization: Secondary | ICD-10-CM | POA: Diagnosis not present

## 2017-07-02 NOTE — Telephone Encounter (Signed)
Liliane Channel (husband) notified.

## 2017-07-11 ENCOUNTER — Other Ambulatory Visit: Payer: Self-pay | Admitting: Cardiovascular Disease

## 2017-07-11 NOTE — Addendum Note (Signed)
Addended by: Merlene Laughter on: 07/11/2017 08:53 AM   Modules accepted: Orders

## 2017-07-29 ENCOUNTER — Encounter: Payer: Self-pay | Admitting: Cardiovascular Disease

## 2017-07-29 ENCOUNTER — Ambulatory Visit (INDEPENDENT_AMBULATORY_CARE_PROVIDER_SITE_OTHER): Payer: Medicare Other | Admitting: Cardiovascular Disease

## 2017-07-29 VITALS — BP 132/60 | HR 68 | Ht 66.0 in | Wt 151.0 lb

## 2017-07-29 DIAGNOSIS — I10 Essential (primary) hypertension: Secondary | ICD-10-CM

## 2017-07-29 DIAGNOSIS — R072 Precordial pain: Secondary | ICD-10-CM | POA: Diagnosis not present

## 2017-07-29 DIAGNOSIS — R9431 Abnormal electrocardiogram [ECG] [EKG]: Secondary | ICD-10-CM | POA: Diagnosis not present

## 2017-07-29 DIAGNOSIS — R0602 Shortness of breath: Secondary | ICD-10-CM | POA: Diagnosis not present

## 2017-07-29 DIAGNOSIS — Z8249 Family history of ischemic heart disease and other diseases of the circulatory system: Secondary | ICD-10-CM

## 2017-07-29 MED ORDER — HYDRALAZINE HCL 25 MG PO TABS: 25.0000 mg | ORAL_TABLET | Freq: Two times a day (BID) | ORAL | 6 refills | Status: DC

## 2017-07-29 NOTE — Progress Notes (Signed)
SUBJECTIVE: The patient presents for routine follow-up.  In the interim she developed dizziness and stopped Norvasc but dizziness persisted.  She does have sinus issues.  She did not have chest pain or fever.  Stress test results 11/22/15:  Abnormal ST segment depression of 1.5 mm in leads II, III, aVF, and V5 through V6 at peak heart rate. Chest pain was also reported. Hypertensive response noted. Moderate risk Duke treadmill score of -5.  Blood pressure demonstrated a hypertensive response to exercise.  Small, mild intensity, fixed apical anteroseptal defect that is most consistent with breast attenuation. No clear evidence of ischemia.  This is a low risk study based on perfusion imaging although ECG abnormalities suggest moderately increased risk based on Duke treadmill score. Could be "false positive" result. Would correlate clinically. Nuclear stress EF: 78%.   She has been having problems with left maxillary sinus pain.  She is scheduled to see ENT in 2 days.  I reviewed her blood pressure log.  Overall it is much better controlled but she still has occasional systolic readings as high as 150.  She denies chest pain, leg swelling, and shortness of breath.  She has had some bilateral leg cramping, moreso in the right leg and foot.      Review of Systems: As per "subjective", otherwise negative.  Allergies  Allergen Reactions  . Advair Diskus [Fluticasone-Salmeterol] Anaphylaxis  . Codeine Nausea And Vomiting  . Hydrocodone-Acetaminophen Nausea And Vomiting  . Oxycodone-Acetaminophen Nausea And Vomiting  . Tetracyclines & Related Itching    Internal    Current Outpatient Prescriptions  Medication Sig Dispense Refill  . amLODipine (NORVASC) 10 MG tablet Take 10 mg by mouth daily.    . Aspirin-Salicylamide-Caffeine (BC HEADACHE) 325-95-16 MG TABS Take 0.5 packets by mouth daily as needed (FOR PAIN).    . BD PEN NEEDLE NANO U/F 32G X 4 MM  MISC USE EVERY DAY AS DIRECTED 100 each 3  . enalapril (VASOTEC) 20 MG tablet TAKE 2 TABLETS (40 MG TOTAL) BY MOUTH DAILY. 180 tablet 1  . Insulin Detemir (LEVEMIR FLEXTOUCH) 100 UNIT/ML Pen INJECT 30 UNITS INTO THE SKIN AT BEDTIME 45 mL 0  . levothyroxine (SYNTHROID, LEVOTHROID) 75 MCG tablet TAKE 1 TABLET (75 MCG TOTAL) BY MOUTH DAILY BEFORE BREAKFAST. 90 tablet 1  . metFORMIN (GLUCOPHAGE) 1000 MG tablet TAKE 1 TABLET (1,000 MG TOTAL) BY MOUTH 2 (TWO) TIMES DAILY WITH A MEAL. 180 tablet 1  . metoprolol tartrate (LOPRESSOR) 50 MG tablet TAKE 1.5 TABLETS (75 MG TOTAL) BY MOUTH 2 (TWO) TIMES DAILY. 270 tablet 1  . montelukast (SINGULAIR) 10 MG tablet Take 10 mg by mouth at bedtime.     . ONE TOUCH ULTRA TEST test strip TEST TWICE A DAY AS DIRECTED E11.65 200 each 1  . ONETOUCH DELICA LANCETS 18A MISC Use as instructed bid E11.65 100 each 3  . pantoprazole (PROTONIX) 40 MG tablet Take 40 mg by mouth 2 (two) times daily.     . traMADol (ULTRAM) 50 MG tablet Take 1 tablet by mouth as needed.     No current facility-administered medications for this visit.     Past Medical History:  Diagnosis Date  . Arthritis   . Asthma    takes Singulair nightly  . Diabetes mellitus    takes levemir daily and Metformin daily  . Generalized headaches   . GERD (gastroesophageal reflux disease)    takes Omeprazole daily  . Herniated disc, cervical   .  History of kidney stones   . Hyperlipidemia    takes Welchol daily  . Hypertension    takes Enalapril dialy  . Hypothyroidism    takes Synthroid daily  . IBS (irritable bowel syndrome)   . Joint pain    hands and neck  . Nocturia   . PONV (postoperative nausea and vomiting)   . Seizures ([place])    denies  . TIA (transient ischemic attack)    diagnosed by EEG but never knew she had it  . Trigger finger   . Weakness   . Weakness    in both hands    Past Surgical History:  Procedure Laterality Date  . ABDOMINAL HYSTERECTOMY  1980  . ANTERIOR  CERVICAL DECOMP/DISCECTOMY FUSION N/A 10/18/2013   Procedure: Cervical three-four, Cervical four-five, Cervical seven-Thoracic one anterior cervical decompression with fusion plating and bonegraft;  Surgeon: Hosie Spangle, MD;  Location: [facility];  Service: Neurosurgery;  Laterality: N/A;  Cervical three-four, Cervical four-five, Cervical seven-Thoracic one anterior cervical decompression with fusion plating and bonegraft  . APPENDECTOMY     done with hysterectomy  . BACK SURGERY  2007   lumbar fusion  . carpel tunnel Bilateral    left first then right  . cataract implants    . COLONOSCOPY    . ESOPHAGOGASTRODUODENOSCOPY     hx of h pylori  . EYE SURGERY  11/18/2008   right - cataracts  . EYE SURGERY  06/01/2009   left - cataracts  . herniated disc   10/2000, 08/31/2008   C5-C6 then C6-C7  . knee anthroscopy bilateral Bilateral   . KNEE ARTHROSCOPY    . LITHOTRIPSY  11/12/2007   right side  . Parathyroid surgery     October 2013  . PARATHYROIDECTOMY  07/02/2012   Procedure: PARATHYROIDECTOMY;  Surgeon: Earnstine Regal, MD;  Location: [facility];  Service: General;  Laterality: N/A;  . TONSILLECTOMY     as a child    Social History   Social History  . Marital status: Married    Spouse name: N/A  . Number of children: N/A  . Years of education: N/A   Occupational History  . Not on file.   Social History Main Topics  . Smoking status: Former Smoker    Packs/day: 1.00    Years: 40.00    Quit date: 07/19/2012  . Smokeless tobacco: Never Used     Comment: quit smoking in Oct 2013  . Alcohol use No  . Drug use: No  . Sexual activity: Not on file   Other Topics Concern  . Not on file   Social History Narrative  . No narrative on file     Vitals:   07/29/17 1403  BP: 132/60  Pulse: 68  SpO2: 98%  Weight: 151 lb (68.5 kg)  Height: 5\' 6"  (1.676 m)    Wt Readings from Last 3 Encounters:  07/29/17 151 lb (68.5 kg)  05/26/17 153 lb (69.4 kg)  04/24/17 151 lb (68.5  kg)     PHYSICAL EXAM General: NAD HEENT: Normal. Neck: No JVD, no thyromegaly. Lungs: Clear to auscultation bilaterally with normal respiratory effort. CV: Regular rate and rhythm, normal S1/S2, no S3/S4, no murmur. No pretibial or periankle edema.  No carotid bruit.   Abdomen: Soft, nontender, no distention.  Neurologic: Alert and oriented.  Psych: Normal affect. Skin: Normal. Musculoskeletal: No gross deformities.    ECG: Most recent ECG reviewed.   Labs: Lab Results  Component Value  Date/Time   K 4.6 04/16/2017 07:44 AM   BUN 15 04/16/2017 07:44 AM   CREATININE 0.81 04/16/2017 07:44 AM   ALT 23 04/16/2017 07:44 AM   TSH 2.66 04/16/2017 07:44 AM   HGB 12.4 04/05/2016 04:16 PM     Lipids: Lab Results  Component Value Date/Time   LDLCALC 96 10/14/2016 07:21 AM   CHOL 156 10/14/2016 07:21 AM   TRIG 163 (H) 10/14/2016 07:21 AM   HDL 27 (L) 10/14/2016 07:21 AM       ASSESSMENT AND PLAN:  1. Chest pain and shortness of breath: Stress test reviewed above in detail. Symptomatically stable.  2. Essential hypertension: Blood pressure is controlled today with systolic readings at home as high as 150. Did not tolerate chlorthalidone.  She is currently on enalapril, amlodipine, and metoprolol. I will attempt hydralazine 25 mg twice daily for feasibility.     Disposition: Follow up 3 months.   Kate Sable, M.D., F.A.C.C.

## 2017-07-29 NOTE — Patient Instructions (Signed)
Medication Instructions:   Begin Hydralazine 25mg  twice a day.  Continue all other medications.    Labwork: none  Testing/Procedures: none  Follow-Up: 3 months   Any Other Special Instructions Will Be Listed Below (If Applicable).  If you need a refill on your cardiac medications before your next appointment, please call your pharmacy.

## 2017-07-31 ENCOUNTER — Ambulatory Visit (INDEPENDENT_AMBULATORY_CARE_PROVIDER_SITE_OTHER): Payer: Medicare Other | Admitting: Otolaryngology

## 2017-07-31 DIAGNOSIS — R42 Dizziness and giddiness: Secondary | ICD-10-CM

## 2017-07-31 DIAGNOSIS — J342 Deviated nasal septum: Secondary | ICD-10-CM

## 2017-07-31 DIAGNOSIS — H903 Sensorineural hearing loss, bilateral: Secondary | ICD-10-CM

## 2017-07-31 DIAGNOSIS — J343 Hypertrophy of nasal turbinates: Secondary | ICD-10-CM | POA: Diagnosis not present

## 2017-08-11 ENCOUNTER — Other Ambulatory Visit: Payer: Self-pay | Admitting: Cardiovascular Disease

## 2017-08-11 ENCOUNTER — Other Ambulatory Visit: Payer: Self-pay | Admitting: "Endocrinology

## 2017-08-14 DIAGNOSIS — M85852 Other specified disorders of bone density and structure, left thigh: Secondary | ICD-10-CM | POA: Diagnosis not present

## 2017-08-14 DIAGNOSIS — E2839 Other primary ovarian failure: Secondary | ICD-10-CM | POA: Diagnosis not present

## 2017-08-19 DIAGNOSIS — Z794 Long term (current) use of insulin: Secondary | ICD-10-CM | POA: Diagnosis not present

## 2017-08-19 DIAGNOSIS — E1165 Type 2 diabetes mellitus with hyperglycemia: Secondary | ICD-10-CM | POA: Diagnosis not present

## 2017-08-19 DIAGNOSIS — R7309 Other abnormal glucose: Secondary | ICD-10-CM | POA: Diagnosis not present

## 2017-08-20 LAB — HEMOGLOBIN A1C
HEMOGLOBIN A1C: 6.7 %{Hb} — AB (ref <5.7)
Mean Plasma Glucose: 146 (calc)
eAG (mmol/L): 8.1 (calc)

## 2017-08-20 LAB — RENAL FUNCTION PANEL
Albumin: 4.1 g/dL (ref 3.6–5.1)
BUN: 14 mg/dL (ref 7–25)
CO2: 29 mmol/L (ref 20–32)
CREATININE: 0.86 mg/dL (ref 0.50–0.99)
Calcium: 9 mg/dL (ref 8.6–10.4)
Chloride: 104 mmol/L (ref 98–110)
GLUCOSE: 111 mg/dL — AB (ref 65–99)
PHOSPHORUS: 3.6 mg/dL (ref 2.1–4.3)
Potassium: 4.5 mmol/L (ref 3.5–5.3)
Sodium: 140 mmol/L (ref 135–146)

## 2017-08-25 ENCOUNTER — Ambulatory Visit: Payer: Medicare Other | Admitting: "Endocrinology

## 2017-08-26 ENCOUNTER — Ambulatory Visit (INDEPENDENT_AMBULATORY_CARE_PROVIDER_SITE_OTHER): Payer: Medicare Other | Admitting: "Endocrinology

## 2017-08-26 ENCOUNTER — Encounter: Payer: Self-pay | Admitting: "Endocrinology

## 2017-08-26 VITALS — BP 137/69 | HR 72 | Ht 66.0 in | Wt 152.0 lb

## 2017-08-26 DIAGNOSIS — E1165 Type 2 diabetes mellitus with hyperglycemia: Secondary | ICD-10-CM | POA: Diagnosis not present

## 2017-08-26 DIAGNOSIS — E782 Mixed hyperlipidemia: Secondary | ICD-10-CM

## 2017-08-26 DIAGNOSIS — E039 Hypothyroidism, unspecified: Secondary | ICD-10-CM

## 2017-08-26 DIAGNOSIS — I10 Essential (primary) hypertension: Secondary | ICD-10-CM | POA: Diagnosis not present

## 2017-08-26 NOTE — Patient Instructions (Signed)

## 2017-08-26 NOTE — Progress Notes (Signed)
Subjective:    Patient ID: [name], female    DOB: 12/16/1950,    Past Medical History:  Diagnosis Date  . Arthritis   . Asthma    takes Singulair nightly  . Diabetes mellitus    takes levemir daily and Metformin daily  . Generalized headaches   . GERD (gastroesophageal reflux disease)    takes Omeprazole daily  . Herniated disc, cervical   . History of kidney stones   . Hyperlipidemia    takes Welchol daily  . Hypertension    takes Enalapril dialy  . Hypothyroidism    takes Synthroid daily  . IBS (irritable bowel syndrome)   . Joint pain    hands and neck  . Nocturia   . PONV (postoperative nausea and vomiting)   . Seizures ([place])    denies  . TIA (transient ischemic attack)    diagnosed by EEG but never knew she had it  . Trigger finger   . Weakness   . Weakness    in both hands   Past Surgical History:  Procedure Laterality Date  . ABDOMINAL HYSTERECTOMY  1980  . ANTERIOR CERVICAL DECOMP/DISCECTOMY FUSION N/A 10/18/2013   Procedure: Cervical three-four, Cervical four-five, Cervical seven-Thoracic one anterior cervical decompression with fusion plating and bonegraft;  Surgeon: Hosie Spangle, MD;  Location: [facility];  Service: Neurosurgery;  Laterality: N/A;  Cervical three-four, Cervical four-five, Cervical seven-Thoracic one anterior cervical decompression with fusion plating and bonegraft  . APPENDECTOMY     done with hysterectomy  . BACK SURGERY  2007   lumbar fusion  . carpel tunnel Bilateral    left first then right  . cataract implants    . COLONOSCOPY    . ESOPHAGOGASTRODUODENOSCOPY     hx of h pylori  . EYE SURGERY  11/18/2008   right - cataracts  . EYE SURGERY  06/01/2009   left - cataracts  . herniated disc   10/2000, 08/31/2008   C5-C6 then C6-C7  . knee anthroscopy bilateral Bilateral   . KNEE ARTHROSCOPY    . LITHOTRIPSY  11/12/2007   right side  . Parathyroid surgery     October 2013  . PARATHYROIDECTOMY  07/02/2012   Procedure: PARATHYROIDECTOMY;  Surgeon: Earnstine Regal, MD;  Location: [facility];  Service: General;  Laterality: N/A;  . TONSILLECTOMY     as a child   Social History   Socioeconomic History  . Marital status: Married    Spouse name: None  . Number of children: None  . Years of education: None  . Highest education level: None  Social Needs  . Financial resource strain: None  . Food insecurity - worry: None  . Food insecurity - inability: None  . Transportation needs - medical: None  . Transportation needs - non-medical: None  Occupational History  . None  Tobacco Use  . Smoking status: Former Smoker    Packs/day: 1.00    Years: 40.00    Pack years: 40.00    Last attempt to quit: 07/19/2012    Years since quitting: 5.1  . Smokeless tobacco: Never Used  . Tobacco comment: quit smoking in Oct 2013  Substance and Sexual Activity  . Alcohol use: No    Alcohol/week: 0.0 oz  . Drug use: No  . Sexual activity: None  Other Topics Concern  . None  Social History Narrative  . None   Outpatient Encounter Medications as of 08/26/2017  Medication Sig  .  amLODipine (NORVASC) 10 MG tablet Take 10 mg by mouth daily.  . Aspirin-Salicylamide-Caffeine (BC HEADACHE) 325-95-16 MG TABS Take 0.5 packets by mouth daily as needed (FOR PAIN).  . BD PEN NEEDLE NANO U/F 32G X 4 MM MISC USE EVERY DAY AS DIRECTED  . enalapril (VASOTEC) 20 MG tablet TAKE 2 TABLETS (40 MG TOTAL) BY MOUTH DAILY.  . hydrALAZINE (APRESOLINE) 25 MG tablet Take 1 tablet (25 mg total) by mouth 2 (two) times daily.  . Insulin Detemir (LEVEMIR FLEXTOUCH) 100 UNIT/ML Pen INJECT 30 UNITS INTO THE SKIN AT BEDTIME  . levothyroxine (SYNTHROID, LEVOTHROID) 75 MCG tablet TAKE 1 TABLET (75 MCG TOTAL) BY MOUTH DAILY BEFORE BREAKFAST.  . metFORMIN (GLUCOPHAGE) 1000 MG tablet TAKE 1 TABLET (1,000 MG TOTAL) BY MOUTH 2 (TWO) TIMES DAILY WITH A MEAL.  . metoprolol tartrate (LOPRESSOR) 50 MG tablet TAKE 1.5 TABLETS (75 MG TOTAL) BY MOUTH 2  (TWO) TIMES DAILY.  . montelukast (SINGULAIR) 10 MG tablet Take 10 mg by mouth at bedtime.   . ONE TOUCH ULTRA TEST test strip TEST TWICE A DAY AS DIRECTED E11.65  . ONETOUCH DELICA LANCETS 81X MISC Use as instructed bid E11.65  . pantoprazole (PROTONIX) 40 MG tablet Take 40 mg by mouth 2 (two) times daily.   . traMADol (ULTRAM) 50 MG tablet Take 1 tablet by mouth as needed.   No facility-administered encounter medications on file as of 08/26/2017.    ALLERGIES: Allergies  Allergen Reactions  . Advair Diskus [Fluticasone-Salmeterol] Anaphylaxis  . Codeine Nausea And Vomiting  . Hydrocodone-Acetaminophen Nausea And Vomiting  . Oxycodone-Acetaminophen Nausea And Vomiting  . Tetracyclines & Related Itching    Internal   VACCINATION STATUS: Immunization History  Administered Date(s) Administered  . Tdap 07/12/2014    Diabetes  She presents for her follow-up diabetic visit. She has type 2 diabetes mellitus. Onset time: Diagnosed approximately at age  7 years. Her disease course has been stable. Pertinent negatives for hypoglycemia include no confusion, headaches, pallor or seizures. Associated symptoms include fatigue. Pertinent negatives for diabetes include no chest pain, no polydipsia, no polyphagia and no polyuria. Symptoms are stable. There are no diabetic complications. Risk factors for coronary artery disease include tobacco exposure, hypertension and dyslipidemia. Current diabetic treatment includes insulin injections and oral agent (monotherapy). She is compliant with treatment most of the time. Her weight is stable. She is following a generally unhealthy diet. Her breakfast blood glucose range is generally 130-140 mg/dl. An ACE inhibitor/angiotensin II receptor blocker is being taken.  Hyperlipidemia  This is a chronic problem. The current episode started more than 1 year ago. The problem is controlled. Pertinent negatives include no chest pain, myalgias or shortness of breath.  Current antihyperlipidemic treatment includes bile acid squestrants. Risk factors for coronary artery disease include dyslipidemia, diabetes mellitus, hypertension and a sedentary lifestyle.  Hypertension  This is a chronic problem. The current episode started more than 1 year ago. The problem is controlled. Pertinent negatives include no chest pain, headaches, palpitations or shortness of breath. Past treatments include ACE inhibitors.     Review of Systems  Constitutional: Positive for fatigue. Negative for unexpected weight change.  HENT: Negative for trouble swallowing and voice change.   Eyes: Negative for visual disturbance.  Respiratory: Negative for cough, shortness of breath and wheezing.   Cardiovascular: Negative for chest pain, palpitations and leg swelling.  Gastrointestinal: Negative for diarrhea, nausea and vomiting.  Endocrine: Negative for cold intolerance, heat intolerance, polydipsia, polyphagia and polyuria.  Musculoskeletal:  Negative for arthralgias and myalgias.  Skin: Negative for color change, pallor, rash and wound.  Neurological: Negative for seizures and headaches.  Psychiatric/Behavioral: Negative for confusion and suicidal ideas.    Objective:    BP 137/69   Pulse 72   Ht 5\' 6"  (1.676 m)   Wt 152 lb (68.9 kg)   BMI 24.53 kg/m   Wt Readings from Last 3 Encounters:  08/26/17 152 lb (68.9 kg)  07/29/17 151 lb (68.5 kg)  05/26/17 153 lb (69.4 kg)    Physical Exam  Constitutional: She is oriented to person, place, and time. She appears well-developed.  HENT:  Head: Normocephalic and atraumatic.  Eyes: EOM are normal.  Neck: Normal range of motion. Neck supple. No tracheal deviation present. No thyromegaly present.  Cardiovascular: Normal rate and regular rhythm.  Pulmonary/Chest: Effort normal and breath sounds normal.  Abdominal: Soft. Bowel sounds are normal. There is no tenderness. There is no guarding.  Musculoskeletal: Normal range of motion. She  exhibits no edema.  Neurological: She is alert and oriented to person, place, and time. She has normal reflexes. No cranial nerve deficit. Coordination normal.  Skin: Skin is warm and dry. No rash noted. No erythema. No pallor.  Psychiatric: She has a normal mood and affect. Judgment normal.    Results for orders placed or performed in visit on 04/24/17  Renal function panel  Result Value Ref Range   Glucose, Bld 111 (H) 65 - 99 mg/dL   BUN 14 7 - 25 mg/dL   Creat 0.86 0.50 - 0.99 mg/dL   BUN/Creatinine Ratio NOT APPLICABLE 6 - 22 (calc)   Sodium 140 135 - 146 mmol/L   Potassium 4.5 3.5 - 5.3 mmol/L   Chloride 104 98 - 110 mmol/L   CO2 29 20 - 32 mmol/L   Calcium 9.0 8.6 - 10.4 mg/dL   Phosphorus 3.6 2.1 - 4.3 mg/dL   Albumin 4.1 3.6 - 5.1 g/dL  Hemoglobin A1c  Result Value Ref Range   Hgb A1c MFr Bld 6.7 (H) <5.7 % of total Hgb   Mean Plasma Glucose 146 (calc)   eAG (mmol/L) 8.1 (calc)   Complete Blood Count (Most recent): Lab Results  Component Value Date   WBC 7.4 04/05/2016   HGB 12.4 04/05/2016   HCT 39.1 04/05/2016   MCV 79.6 04/05/2016   PLT 233 04/05/2016   Chemistry (most recent): Lab Results  Component Value Date   NA 140 08/19/2017   K 4.5 08/19/2017   CL 104 08/19/2017   CO2 29 08/19/2017   BUN 14 08/19/2017   CREATININE 0.86 08/19/2017   Diabetic Labs (most recent): Lab Results  Component Value Date   HGBA1C 6.7 (H) 08/19/2017   HGBA1C 6.5 (H) 04/16/2017   HGBA1C 6.1 (H) 01/14/2017    Assessment & Plan:   1. Uncontrolled type 2 diabetes mellitus without complication, with long-term current use of insulin ([place])  She  came with target fasting glucose profile, and  recent A1c of 6.7% overall improving from 11.5 %.  Glucose logs and insulin administration records pertaining to this visit,  to be scanned into patient's records.  Recent labs reviewed. -Patient remains at a high risk for more acute and chronic complications of diabetes which include  CAD, CVA, CKD, retinopathy, and neuropathy. These are all discussed in detail with the patient. -I have re-counseled the patient on diet management and weight loss, by adopting a carbohydrate restricted / protein rich  Diet. Patient is advised to  stick to a routine mealtimes to eat 3 meals  a day and avoid unnecessary snacks ( to snack only to correct hypoglycemia).  -  Suggestion is made for her to avoid simple carbohydrates  from her diet including Cakes, Sweet Desserts / Pastries, Ice Cream, Soda (diet and regular), Sweet Tea, Candies, Chips, Cookies, Store Bought Juices, Alcohol in Excess of  1-2 drinks a day, Artificial Sweeteners, and "Sugar-free" Products. This will help patient to have stable blood glucose profile and potentially avoid unintended weight gain.   -I have approached patient to continue on  intensive monitoring of blood glucose and  Therapy as follows, and patient agrees.  - I will continue Levemir  30 units daily at bedtime, associated with strict monitoring of glucose  every morning,  and as needed.  -Patient is encouraged to call clinic for blood glucose levels less than 70 or above 300 mg /dl.  - I will continue metformin 1000 mg by mouth twice a day, therapeutically suitable for patient..   - Patient specific target  for A1c; LDL, HDL, Triglycerides, and  Waist Circumference were discussed in detail.  2) BP/HTN: unontrolled.  Continue current medications including ACEI, amlodipine was recently added by her PMD. I have advised her to measure her blood pressure at home, report if readings are above 140/90. 3) Lipids/HPL: Intolerant to statins .continue WelChol   4)  Weight/Diet: exercise, and carbohydrates information provided.  5) hypothyroidism:   - Her thyroid function tests are consistent with appropriate replacement. I will continue levothyroxine 75 g by mouth every morning.  - We discussed about correct intake of levothyroxine, at fasting, with water, separated  by at least 30 minutes from breakfast, and separated by more than 4 hours from calcium, iron, multivitamins, acid reflux medications (PPIs). -Patient is made aware of the fact that thyroid hormone replacement is needed for life, dose to be adjusted by periodic monitoring of thyroid function tests.  6) Chronic Care/Health Maintenance: -Patient on ACEI medications and encouraged to continue to follow up with Ophthalmology, Podiatrist at least yearly or according to recommendations, and advised to quit smoking. I have recommended yearly flu vaccine and pneumonia vaccination at least every 5 years; moderate intensity exercise for up to 150 minutes weekly; and  sleep for at least 7 hours a day.  7) history of hyperparathyroidism: She is status post parathyroidectomy. Calcium is stable and normal at 9.   - Time spent with the patient: 25 min, of which >50% was spent in reviewing her sugar logs , discussing her hypo- and hyper-glycemic episodes, reviewing her current and  previous labs and insulin doses and developing a plan to avoid hypo- and hyper-glycemia.   Follow up plan: Return in about 6 months (around 02/23/2018) for follow up with pre-visit labs, meter, and logs.  Glade Lloyd, MD Phone: [phone]  Fax: [phone]  -  This note was partially dictated with voice recognition software. Similar sounding words can be transcribed inadequately or may not  be corrected upon review.  08/26/2017, 9:33 AM

## 2017-10-21 DIAGNOSIS — Z01419 Encounter for gynecological examination (general) (routine) without abnormal findings: Secondary | ICD-10-CM | POA: Diagnosis not present

## 2017-10-21 DIAGNOSIS — Z1231 Encounter for screening mammogram for malignant neoplasm of breast: Secondary | ICD-10-CM | POA: Diagnosis not present

## 2017-10-21 DIAGNOSIS — Z1389 Encounter for screening for other disorder: Secondary | ICD-10-CM | POA: Diagnosis not present

## 2017-10-27 ENCOUNTER — Other Ambulatory Visit: Payer: Self-pay | Admitting: Obstetrics and Gynecology

## 2017-10-27 DIAGNOSIS — N6310 Unspecified lump in the right breast, unspecified quadrant: Secondary | ICD-10-CM

## 2017-10-30 ENCOUNTER — Encounter: Payer: Self-pay | Admitting: Cardiovascular Disease

## 2017-10-30 ENCOUNTER — Ambulatory Visit (INDEPENDENT_AMBULATORY_CARE_PROVIDER_SITE_OTHER): Payer: Medicare Other | Admitting: Cardiovascular Disease

## 2017-10-30 VITALS — BP 118/48 | HR 83 | Ht 66.0 in | Wt 154.0 lb

## 2017-10-30 DIAGNOSIS — R0602 Shortness of breath: Secondary | ICD-10-CM

## 2017-10-30 DIAGNOSIS — R072 Precordial pain: Secondary | ICD-10-CM

## 2017-10-30 DIAGNOSIS — I10 Essential (primary) hypertension: Secondary | ICD-10-CM | POA: Diagnosis not present

## 2017-10-30 DIAGNOSIS — Z8249 Family history of ischemic heart disease and other diseases of the circulatory system: Secondary | ICD-10-CM | POA: Diagnosis not present

## 2017-10-30 MED ORDER — HYDRALAZINE HCL 25 MG PO TABS: 25.0000 mg | ORAL_TABLET | Freq: Two times a day (BID) | ORAL | 3 refills | Status: DC

## 2017-10-30 NOTE — Progress Notes (Signed)
SUBJECTIVE: The patient presents for follow-up of chest pain, shortness of breath, and hypertension. She is doing well overall.  She denies chest pain, palpitations, dizziness, orthopnea, and shortness of breath.  I reviewed her blood pressure log and she has primarily normotensive readings.  Her home blood pressure cuff tends to have higher readings than ones in the office.  She has occasional right ankle and foot swelling but this quickly resolves on its own.  She has been volunteering with hospice.     Review of Systems: As per "subjective", otherwise negative.  Allergies  Allergen Reactions  . Advair Diskus [Fluticasone-Salmeterol] Anaphylaxis  . Codeine Nausea And Vomiting  . Hydrocodone-Acetaminophen Nausea And Vomiting  . Oxycodone-Acetaminophen Nausea And Vomiting  . Tetracyclines & Related Itching    Internal    Current Outpatient Medications  Medication Sig Dispense Refill  . amLODipine (NORVASC) 10 MG tablet Take 10 mg by mouth daily.    . Aspirin-Salicylamide-Caffeine (BC HEADACHE) 325-95-16 MG TABS Take 0.5 packets by mouth daily as needed (FOR PAIN).    . BD PEN NEEDLE NANO U/F 32G X 4 MM MISC USE EVERY DAY AS DIRECTED 100 each 3  . cholecalciferol (VITAMIN D) 1000 units tablet Take 1,000 Units by mouth daily.    . enalapril (VASOTEC) 20 MG tablet TAKE 2 TABLETS (40 MG TOTAL) BY MOUTH DAILY. 180 tablet 1  . hydrALAZINE (APRESOLINE) 25 MG tablet Take 1 tablet (25 mg total) by mouth 2 (two) times daily. 60 tablet 6  . Insulin Detemir (LEVEMIR FLEXTOUCH) 100 UNIT/ML Pen INJECT 30 UNITS INTO THE SKIN AT BEDTIME 45 mL 0  . levothyroxine (SYNTHROID, LEVOTHROID) 75 MCG tablet TAKE 1 TABLET (75 MCG TOTAL) BY MOUTH DAILY BEFORE BREAKFAST. 90 tablet 1  . metFORMIN (GLUCOPHAGE) 1000 MG tablet TAKE 1 TABLET (1,000 MG TOTAL) BY MOUTH 2 (TWO) TIMES DAILY WITH A MEAL. 180 tablet 1  . metoprolol tartrate (LOPRESSOR) 50 MG tablet TAKE 1.5 TABLETS (75 MG TOTAL) BY MOUTH 2  (TWO) TIMES DAILY. 270 tablet 1  . montelukast (SINGULAIR) 10 MG tablet Take 10 mg by mouth at bedtime.     . ONE TOUCH ULTRA TEST test strip TEST TWICE A DAY AS DIRECTED E11.65 200 each 1  . ONETOUCH DELICA LANCETS 40J MISC Use as instructed bid E11.65 100 each 3  . pantoprazole (PROTONIX) 40 MG tablet Take 40 mg by mouth 2 (two) times daily.     . traMADol (ULTRAM) 50 MG tablet Take 1 tablet by mouth as needed.     No current facility-administered medications for this visit.     Past Medical History:  Diagnosis Date  . Arthritis   . Asthma    takes Singulair nightly  . Diabetes mellitus    takes levemir daily and Metformin daily  . Generalized headaches   . GERD (gastroesophageal reflux disease)    takes Omeprazole daily  . Herniated disc, cervical   . History of kidney stones   . Hyperlipidemia    takes Welchol daily  . Hypertension    takes Enalapril dialy  . Hypothyroidism    takes Synthroid daily  . IBS (irritable bowel syndrome)   . Joint pain    hands and neck  . Nocturia   . PONV (postoperative nausea and vomiting)   . Seizures ([place])    denies  . TIA (transient ischemic attack)    diagnosed by EEG but never knew she had it  . Trigger finger   .  Weakness   . Weakness    in both hands    Past Surgical History:  Procedure Laterality Date  . ABDOMINAL HYSTERECTOMY  1980  . ANTERIOR CERVICAL DECOMP/DISCECTOMY FUSION N/A 10/18/2013   Procedure: Cervical three-four, Cervical four-five, Cervical seven-Thoracic one anterior cervical decompression with fusion plating and bonegraft;  Surgeon: Hosie Spangle, MD;  Location: [facility];  Service: Neurosurgery;  Laterality: N/A;  Cervical three-four, Cervical four-five, Cervical seven-Thoracic one anterior cervical decompression with fusion plating and bonegraft  . APPENDECTOMY     done with hysterectomy  . BACK SURGERY  2007   lumbar fusion  . carpel tunnel Bilateral    left first then right  . cataract implants     . COLONOSCOPY    . ESOPHAGOGASTRODUODENOSCOPY     hx of h pylori  . EYE SURGERY  11/18/2008   right - cataracts  . EYE SURGERY  06/01/2009   left - cataracts  . herniated disc   10/2000, 08/31/2008   C5-C6 then C6-C7  . knee anthroscopy bilateral Bilateral   . KNEE ARTHROSCOPY    . LITHOTRIPSY  11/12/2007   right side  . Parathyroid surgery     October 2013  . PARATHYROIDECTOMY  07/02/2012   Procedure: PARATHYROIDECTOMY;  Surgeon: Earnstine Regal, MD;  Location: [facility];  Service: General;  Laterality: N/A;  . TONSILLECTOMY     as a child    Social History   Socioeconomic History  . Marital status: Married    Spouse name: Not on file  . Number of children: Not on file  . Years of education: Not on file  . Highest education level: Not on file  Social Needs  . Financial resource strain: Not on file  . Food insecurity - worry: Not on file  . Food insecurity - inability: Not on file  . Transportation needs - medical: Not on file  . Transportation needs - non-medical: Not on file  Occupational History  . Not on file  Tobacco Use  . Smoking status: Former Smoker    Packs/day: 1.00    Years: 40.00    Pack years: 40.00    Last attempt to quit: 07/19/2012    Years since quitting: 5.2  . Smokeless tobacco: Never Used  . Tobacco comment: quit smoking in Oct 2013  Substance and Sexual Activity  . Alcohol use: No    Alcohol/week: 0.0 oz  . Drug use: No  . Sexual activity: Not on file  Other Topics Concern  . Not on file  Social History Narrative  . Not on file     Vitals:   10/30/17 0828  BP: (!) 118/48  Pulse: 83  SpO2: 98%  Weight: 154 lb (69.9 kg)  Height: 5\' 6"  (1.676 m)    Wt Readings from Last 3 Encounters:  10/30/17 154 lb (69.9 kg)  08/26/17 152 lb (68.9 kg)  07/29/17 151 lb (68.5 kg)     PHYSICAL EXAM General: NAD HEENT: Normal. Neck: No JVD, no thyromegaly. Lungs: Clear to auscultation bilaterally with normal respiratory effort. CV: Regular rate  and rhythm, normal S1/S2, no S3/S4, no murmur. No pretibial or periankle edema.  No carotid bruit.   Abdomen: Soft, nontender, no distention.  Neurologic: Alert and oriented.  Psych: Normal affect. Skin: Normal. Musculoskeletal: No gross deformities.    ECG: Most recent ECG reviewed.   Labs: Lab Results  Component Value Date/Time   K 4.5 08/19/2017 07:54 AM   BUN 14 08/19/2017 07:54  AM   CREATININE 0.86 08/19/2017 07:54 AM   ALT 23 04/16/2017 07:44 AM   TSH 2.66 04/16/2017 07:44 AM   HGB 12.4 04/05/2016 04:16 PM     Lipids: Lab Results  Component Value Date/Time   LDLCALC 96 10/14/2016 07:21 AM   CHOL 156 10/14/2016 07:21 AM   TRIG 163 (H) 10/14/2016 07:21 AM   HDL 27 (L) 10/14/2016 07:21 AM       ASSESSMENT AND PLAN: 1.  Chest pain and shortness of breath: Symptomatically stable.  Blood pressure is controlled.  No changes to therapy.  2.  Chronic hypertension: I reviewed her blood pressure log as noted above with primarily normotensive readings.  She does have some mild right ankle and foot swelling from time to time which is likely due to both amlodipine and sodium and water retention secondary to hydralazine.  It has not been bothersome.  No changes to therapy.    Disposition: Follow up 6 months   Kate Sable, M.D., F.A.C.C.

## 2017-10-30 NOTE — Patient Instructions (Signed)

## 2017-11-01 ENCOUNTER — Other Ambulatory Visit: Payer: Self-pay | Admitting: "Endocrinology

## 2017-11-04 ENCOUNTER — Ambulatory Visit
Admission: RE | Admit: 2017-11-04 | Discharge: 2017-11-04 | Disposition: A | Discharge status: Home / Self Care | Payer: Medicare Other | Source: Ambulatory Visit | Attending: Obstetrics and Gynecology | Admitting: Obstetrics and Gynecology

## 2017-11-04 ENCOUNTER — Ambulatory Visit: Payer: Medicare Other

## 2017-11-04 DIAGNOSIS — N6310 Unspecified lump in the right breast, unspecified quadrant: Secondary | ICD-10-CM

## 2017-11-04 DIAGNOSIS — R922 Inconclusive mammogram: Secondary | ICD-10-CM | POA: Diagnosis not present

## 2017-11-16 ENCOUNTER — Other Ambulatory Visit: Payer: Self-pay | Admitting: Cardiovascular Disease

## 2017-11-25 ENCOUNTER — Other Ambulatory Visit: Payer: Self-pay

## 2017-11-25 MED ORDER — INSULIN DETEMIR 100 UNIT/ML FLEXPEN: PEN_INJECTOR | SUBCUTANEOUS | 0 refills | Status: DC

## 2017-12-01 ENCOUNTER — Other Ambulatory Visit: Payer: Self-pay

## 2017-12-01 MED ORDER — INSULIN DETEMIR 100 UNIT/ML FLEXPEN: PEN_INJECTOR | SUBCUTANEOUS | 0 refills | Status: DC

## 2017-12-09 ENCOUNTER — Telehealth: Payer: Self-pay | Admitting: Cardiovascular Disease

## 2017-12-09 NOTE — Telephone Encounter (Signed)
Can reduce amlodipine to 5 mg daily. If BP begins to rise, should increase hydralazine to 50 mg bid.

## 2017-12-09 NOTE — Telephone Encounter (Signed)
LEG SWOLLEN  And feela tight [phone]

## 2017-12-09 NOTE — Telephone Encounter (Signed)
Pt c/o R leg swelling/legs cramps - thinks this is coming from amlodipine (some mention of this from January office notes)  denies any weight gain/SOB/dizziness - says BP has been WNL.

## 2017-12-10 NOTE — Telephone Encounter (Signed)
Pt aware and voiced understanding - updated medication list. Says if she increases hydralazine she would contact office.

## 2018-01-11 ENCOUNTER — Other Ambulatory Visit: Payer: Self-pay | Admitting: "Endocrinology

## 2018-01-12 ENCOUNTER — Other Ambulatory Visit: Payer: Self-pay | Admitting: Cardiovascular Disease

## 2018-01-12 MED ORDER — HYDRALAZINE HCL 25 MG PO TABS: 25.0000 mg | ORAL_TABLET | Freq: Two times a day (BID) | ORAL | 3 refills | Status: DC

## 2018-01-12 MED ORDER — AMLODIPINE BESYLATE 5 MG PO TABS: 5.0000 mg | ORAL_TABLET | Freq: Every day | ORAL | 3 refills | Status: DC

## 2018-01-12 NOTE — Telephone Encounter (Signed)
Done

## 2018-01-12 NOTE — Telephone Encounter (Signed)
Patient called stating that she needs new prescriptions called in for the following.   [facility]  [place], [place]  Norvasc 5mg  Apresoline 50 mg Requesting 90 day supply

## 2018-01-14 ENCOUNTER — Other Ambulatory Visit: Payer: Self-pay | Admitting: *Deleted

## 2018-01-14 MED ORDER — HYDRALAZINE HCL 50 MG PO TABS: 50.0000 mg | ORAL_TABLET | Freq: Two times a day (BID) | ORAL | 1 refills | Status: DC

## 2018-02-07 ENCOUNTER — Other Ambulatory Visit: Payer: Self-pay | Admitting: Cardiovascular Disease

## 2018-02-18 DIAGNOSIS — R7309 Other abnormal glucose: Secondary | ICD-10-CM | POA: Diagnosis not present

## 2018-02-18 DIAGNOSIS — E782 Mixed hyperlipidemia: Secondary | ICD-10-CM | POA: Diagnosis not present

## 2018-02-18 DIAGNOSIS — E1165 Type 2 diabetes mellitus with hyperglycemia: Secondary | ICD-10-CM | POA: Diagnosis not present

## 2018-02-19 LAB — HEMOGLOBIN A1C
HEMOGLOBIN A1C: 6.5 %{Hb} — AB (ref <5.7)
Mean Plasma Glucose: 140 (calc)
eAG (mmol/L): 7.7 (calc)

## 2018-02-19 LAB — LIPID PANEL
CHOLESTEROL: 149 mg/dL (ref <200)
HDL: 29 mg/dL — AB (ref >50)
LDL Cholesterol (Calc): 93 mg/dL (calc)
Non-HDL Cholesterol (Calc): 120 mg/dL (calc) (ref <130)
Total CHOL/HDL Ratio: 5.1 (calc) — ABNORMAL HIGH (ref <5.0)
Triglycerides: 169 mg/dL — ABNORMAL HIGH (ref <150)

## 2018-02-19 LAB — COMPLETE METABOLIC PANEL WITH GFR
AG RATIO: 2.3 (calc) (ref 1.0–2.5)
ALKALINE PHOSPHATASE (APISO): 54 U/L (ref 33–130)
ALT: 20 U/L (ref 6–29)
AST: 16 U/L (ref 10–35)
Albumin: 4.4 g/dL (ref 3.6–5.1)
BILIRUBIN TOTAL: 0.4 mg/dL (ref 0.2–1.2)
BUN: 15 mg/dL (ref 7–25)
CO2: 29 mmol/L (ref 20–32)
CREATININE: 0.95 mg/dL (ref 0.50–0.99)
Calcium: 9.2 mg/dL (ref 8.6–10.4)
Chloride: 105 mmol/L (ref 98–110)
GFR, Est African American: 71 mL/min/{1.73_m2} (ref >=60)
GFR, Est Non African American: 62 mL/min/{1.73_m2} (ref >=60)
GLOBULIN: 1.9 g/dL (ref 1.9–3.7)
Glucose, Bld: 100 mg/dL — ABNORMAL HIGH (ref 65–99)
POTASSIUM: 4.6 mmol/L (ref 3.5–5.3)
SODIUM: 141 mmol/L (ref 135–146)
Total Protein: 6.3 g/dL (ref 6.1–8.1)

## 2018-02-19 LAB — MICROALBUMIN / CREATININE URINE RATIO
Creatinine, Urine: 37 mg/dL (ref 20–275)
MICROALB UR: 0.3 mg/dL
MICROALB/CREAT RATIO: 8 ug/mg{creat} (ref <30)

## 2018-02-25 ENCOUNTER — Ambulatory Visit (INDEPENDENT_AMBULATORY_CARE_PROVIDER_SITE_OTHER): Payer: Medicare Other | Admitting: "Endocrinology

## 2018-02-25 ENCOUNTER — Encounter: Payer: Self-pay | Admitting: "Endocrinology

## 2018-02-25 VITALS — BP 128/65 | HR 84 | Ht 66.0 in | Wt 150.0 lb

## 2018-02-25 DIAGNOSIS — E782 Mixed hyperlipidemia: Secondary | ICD-10-CM | POA: Diagnosis not present

## 2018-02-25 DIAGNOSIS — E039 Hypothyroidism, unspecified: Secondary | ICD-10-CM | POA: Diagnosis not present

## 2018-02-25 DIAGNOSIS — I10 Essential (primary) hypertension: Secondary | ICD-10-CM | POA: Diagnosis not present

## 2018-02-25 DIAGNOSIS — E1165 Type 2 diabetes mellitus with hyperglycemia: Secondary | ICD-10-CM | POA: Diagnosis not present

## 2018-02-25 NOTE — Patient Instructions (Signed)

## 2018-02-25 NOTE — Progress Notes (Signed)
Subjective:    Patient ID: [name], female    DOB: 11/24/1950,    Past Medical History:  Diagnosis Date  . Arthritis   . Asthma    takes Singulair nightly  . Diabetes mellitus    takes levemir daily and Metformin daily  . Generalized headaches   . GERD (gastroesophageal reflux disease)    takes Omeprazole daily  . Herniated disc, cervical   . History of kidney stones   . Hyperlipidemia    takes Welchol daily  . Hypertension    takes Enalapril dialy  . Hypothyroidism    takes Synthroid daily  . IBS (irritable bowel syndrome)   . Joint pain    hands and neck  . Nocturia   . PONV (postoperative nausea and vomiting)   . Seizures ([place])    denies  . TIA (transient ischemic attack)    diagnosed by EEG but never knew she had it  . Trigger finger   . Weakness   . Weakness    in both hands   Past Surgical History:  Procedure Laterality Date  . ABDOMINAL HYSTERECTOMY  1980  . ANTERIOR CERVICAL DECOMP/DISCECTOMY FUSION N/A 10/18/2013   Procedure: Cervical three-four, Cervical four-five, Cervical seven-Thoracic one anterior cervical decompression with fusion plating and bonegraft;  Surgeon: Hosie Spangle, MD;  Location: [facility];  Service: Neurosurgery;  Laterality: N/A;  Cervical three-four, Cervical four-five, Cervical seven-Thoracic one anterior cervical decompression with fusion plating and bonegraft  . APPENDECTOMY     done with hysterectomy  . BACK SURGERY  2007   lumbar fusion  . carpel tunnel Bilateral    left first then right  . cataract implants    . COLONOSCOPY    . ESOPHAGOGASTRODUODENOSCOPY     hx of h pylori  . EYE SURGERY  11/18/2008   right - cataracts  . EYE SURGERY  06/01/2009   left - cataracts  . herniated disc   10/2000, 08/31/2008   C5-C6 then C6-C7  . knee anthroscopy bilateral Bilateral   . KNEE ARTHROSCOPY    . LITHOTRIPSY  11/12/2007   right side  . Parathyroid surgery     October 2013  . PARATHYROIDECTOMY  07/02/2012    Procedure: PARATHYROIDECTOMY;  Surgeon: Earnstine Regal, MD;  Location: [facility];  Service: General;  Laterality: N/A;  . TONSILLECTOMY     as a child   Social History   Socioeconomic History  . Marital status: Married    Spouse name: Not on file  . Number of children: Not on file  . Years of education: Not on file  . Highest education level: Not on file  Occupational History  . Not on file  Social Needs  . Financial resource strain: Not on file  . Food insecurity:    Worry: Not on file    Inability: Not on file  . Transportation needs:    Medical: Not on file    Non-medical: Not on file  Tobacco Use  . Smoking status: Former Smoker    Packs/day: 1.00    Years: 40.00    Pack years: 40.00    Last attempt to quit: 07/19/2012    Years since quitting: 5.6  . Smokeless tobacco: Never Used  . Tobacco comment: quit smoking in Oct 2013  Substance and Sexual Activity  . Alcohol use: No    Alcohol/week: 0.0 oz  . Drug use: No  . Sexual activity: Not on file  Lifestyle  .  Physical activity:    Days per week: Not on file    Minutes per session: Not on file  . Stress: Not on file  Relationships  . Social connections:    Talks on phone: Not on file    Gets together: Not on file    Attends religious service: Not on file    Active member of club or organization: Not on file    Attends meetings of clubs or organizations: Not on file    Relationship status: Not on file  Other Topics Concern  . Not on file  Social History Narrative  . Not on file   Outpatient Encounter Medications as of 02/25/2018  Medication Sig  . amLODipine (NORVASC) 5 MG tablet Take 1 tablet (5 mg total) by mouth daily.  . Aspirin-Salicylamide-Caffeine (BC HEADACHE) 325-95-16 MG TABS Take 0.5 packets by mouth daily as needed (FOR PAIN).  . BD PEN NEEDLE NANO U/F 32G X 4 MM MISC USE EVERY DAY AS DIRECTED  . cholecalciferol (VITAMIN D) 1000 units tablet Take 1,000 Units by mouth daily.  . enalapril (VASOTEC) 20  MG tablet TAKE 2 TABLETS (40 MG TOTAL) BY MOUTH DAILY.  . hydrALAZINE (APRESOLINE) 50 MG tablet Take 1 tablet (50 mg total) by mouth 2 (two) times daily.  . Insulin Detemir (LEVEMIR FLEXTOUCH) 100 UNIT/ML Pen INJECT 30 UNITS INTO THE SKIN AT BEDTIME  . levothyroxine (SYNTHROID, LEVOTHROID) 75 MCG tablet TAKE 1 TABLET (75 MCG TOTAL) BY MOUTH DAILY BEFORE BREAKFAST.  . metFORMIN (GLUCOPHAGE) 1000 MG tablet TAKE 1 TABLET (1,000 MG TOTAL) BY MOUTH 2 (TWO) TIMES DAILY WITH A MEAL.  . metoprolol tartrate (LOPRESSOR) 50 MG tablet TAKE 1.5 TABLETS (75 MG TOTAL) BY MOUTH 2 (TWO) TIMES DAILY.  . montelukast (SINGULAIR) 10 MG tablet Take 10 mg by mouth at bedtime.   . ONE TOUCH ULTRA TEST test strip TEST TWICE A DAY AS DIRECTED E11.65  . ONETOUCH DELICA LANCETS 23J MISC Use as instructed bid E11.65  . pantoprazole (PROTONIX) 40 MG tablet Take 40 mg by mouth 2 (two) times daily.   . traMADol (ULTRAM) 50 MG tablet Take 1 tablet by mouth as needed.  . [DISCONTINUED] Insulin Detemir (LEVEMIR FLEXTOUCH) 100 UNIT/ML Pen INJECT up to 50 UNITS INTO THE SKIN AT BEDTIME E11.65   No facility-administered encounter medications on file as of 02/25/2018.    ALLERGIES: Allergies  Allergen Reactions  . Advair Diskus [Fluticasone-Salmeterol] Anaphylaxis  . Codeine Nausea And Vomiting  . Hydrocodone-Acetaminophen Nausea And Vomiting  . Oxycodone-Acetaminophen Nausea And Vomiting  . Tetracyclines & Related Itching    Internal   VACCINATION STATUS: Immunization History  Administered Date(s) Administered  . Tdap 07/12/2014    Diabetes  She presents for her follow-up diabetic visit. She has type 2 diabetes mellitus. Onset time: Diagnosed approximately at age  74 years. Her disease course has been improving. Pertinent negatives for hypoglycemia include no confusion, headaches, pallor or seizures. Pertinent negatives for diabetes include no chest pain, no fatigue, no polydipsia, no polyphagia and no polyuria. Symptoms  are improving. There are no diabetic complications. Risk factors for coronary artery disease include tobacco exposure, hypertension, dyslipidemia, diabetes mellitus and post-menopausal. Current diabetic treatment includes insulin injections and oral agent (monotherapy). She is compliant with treatment most of the time. Her weight is stable. She is following a generally unhealthy diet. Her home blood glucose trend is decreasing steadily. Her breakfast blood glucose range is generally 130-140 mg/dl. An ACE inhibitor/angiotensin II receptor blocker is being taken.  Hyperlipidemia  This is a chronic problem. The current episode started more than 1 year ago. The problem is uncontrolled. Pertinent negatives include no chest pain, myalgias or shortness of breath. Current antihyperlipidemic treatment includes bile acid squestrants. Risk factors for coronary artery disease include dyslipidemia, diabetes mellitus, hypertension, a sedentary lifestyle and post-menopausal.  Hypertension  This is a chronic problem. The current episode started more than 1 year ago. The problem is controlled. Pertinent negatives include no chest pain, headaches, palpitations or shortness of breath. Risk factors for coronary artery disease include dyslipidemia, diabetes mellitus, sedentary lifestyle, smoking/tobacco exposure and post-menopausal state. Past treatments include ACE inhibitors.     Review of Systems  Constitutional: Negative for fatigue and unexpected weight change.  HENT: Negative for trouble swallowing and voice change.   Eyes: Negative for visual disturbance.  Respiratory: Negative for cough, shortness of breath and wheezing.   Cardiovascular: Negative for chest pain, palpitations and leg swelling.  Gastrointestinal: Negative for diarrhea, nausea and vomiting.  Endocrine: Negative for cold intolerance, heat intolerance, polydipsia, polyphagia and polyuria.  Musculoskeletal: Negative for arthralgias and myalgias.   Skin: Negative for color change, pallor, rash and wound.  Neurological: Negative for seizures and headaches.  Psychiatric/Behavioral: Negative for confusion and suicidal ideas.    Objective:    BP 128/65   Pulse 84   Ht 5\' 6"  (1.676 m)   Wt 150 lb (68 kg)   BMI 24.21 kg/m   Wt Readings from Last 3 Encounters:  02/25/18 150 lb (68 kg)  10/30/17 154 lb (69.9 kg)  08/26/17 152 lb (68.9 kg)    Physical Exam  Constitutional: She is oriented to person, place, and time. She appears well-developed.  HENT:  Head: Normocephalic and atraumatic.  Eyes: EOM are normal.  Neck: Normal range of motion. Neck supple. No tracheal deviation present. No thyromegaly present.  Cardiovascular: Normal rate.  Pulmonary/Chest: Effort normal.  Abdominal: There is no tenderness. There is no guarding.  Musculoskeletal: Normal range of motion. She exhibits no edema.  Neurological: She is alert and oriented to person, place, and time. She has normal reflexes. No cranial nerve deficit. Coordination normal.  Skin: Skin is warm and dry. No rash noted. No erythema. No pallor.  Psychiatric: She has a normal mood and affect. Judgment normal.    Results for orders placed or performed in visit on 08/26/17  COMPLETE METABOLIC PANEL WITH GFR  Result Value Ref Range   Glucose, Bld 100 (H) 65 - 99 mg/dL   BUN 15 7 - 25 mg/dL   Creat 0.95 0.50 - 0.99 mg/dL   GFR, Est Non African American 62 > OR = 60 mL/min/1.35m2   GFR, Est African American 71 > OR = 60 mL/min/1.69m2   BUN/Creatinine Ratio NOT APPLICABLE 6 - 22 (calc)   Sodium 141 135 - 146 mmol/L   Potassium 4.6 3.5 - 5.3 mmol/L   Chloride 105 98 - 110 mmol/L   CO2 29 20 - 32 mmol/L   Calcium 9.2 8.6 - 10.4 mg/dL   Total Protein 6.3 6.1 - 8.1 g/dL   Albumin 4.4 3.6 - 5.1 g/dL   Globulin 1.9 1.9 - 3.7 g/dL (calc)   AG Ratio 2.3 1.0 - 2.5 (calc)   Total Bilirubin 0.4 0.2 - 1.2 mg/dL   Alkaline phosphatase (APISO) 54 33 - 130 U/L   AST 16 10 - 35 U/L   ALT  20 6 - 29 U/L  Hemoglobin A1c  Result Value Ref Range   Hgb  A1c MFr Bld 6.5 (H) <5.7 % of total Hgb   Mean Plasma Glucose 140 (calc)   eAG (mmol/L) 7.7 (calc)  Lipid panel  Result Value Ref Range   Cholesterol 149 <200 mg/dL   HDL 29 (L) >50 mg/dL   Triglycerides 169 (H) <150 mg/dL   LDL Cholesterol (Calc) 93 mg/dL (calc)   Total CHOL/HDL Ratio 5.1 (H) <5.0 (calc)   Non-HDL Cholesterol (Calc) 120 <130 mg/dL (calc)  Microalbumin / creatinine urine ratio  Result Value Ref Range   Creatinine, Urine 37 20 - 275 mg/dL   Microalb, Ur 0.3 mg/dL   Microalb Creat Ratio 8 <30 mcg/mg creat   Complete Blood Count (Most recent): Lab Results  Component Value Date   WBC 7.4 04/05/2016   HGB 12.4 04/05/2016   HCT 39.1 04/05/2016   MCV 79.6 04/05/2016   PLT 233 04/05/2016   Diabetic Labs (most recent): Lab Results  Component Value Date   HGBA1C 6.5 (H) 02/18/2018   HGBA1C 6.7 (H) 08/19/2017   HGBA1C 6.5 (H) 04/16/2017   Lipid Panel     Component Value Date/Time   CHOL 149 02/18/2018 0735   TRIG 169 (H) 02/18/2018 0735   HDL 29 (L) 02/18/2018 0735   CHOLHDL 5.1 (H) 02/18/2018 0735   VLDL 33 (H) 10/14/2016 0721   LDLCALC 93 02/18/2018 0735    Assessment & Plan:   1. Uncontrolled type 2 diabetes mellitus without complication, with long-term current use of insulin ([place])  She  came with improved glycemic profile to target, A1c 6.5%, generally improving from 11.5%.      Glucose logs and insulin administration records pertaining to this visit,  to be scanned into patient's records.  Recent labs reviewed. -Patient remains at a high risk for more acute and chronic complications of diabetes which include CAD, CVA, CKD, retinopathy, and neuropathy. These are all discussed in detail with the patient. -I have re-counseled the patient on diet management and weight loss, by adopting a carbohydrate restricted / protein rich  Diet. Patient is advised to stick to a routine mealtimes to eat 3  meals  a day and avoid unnecessary snacks ( to snack only to correct hypoglycemia).   -  Suggestion is made for her to avoid simple carbohydrates  from her diet including Cakes, Sweet Desserts / Pastries, Ice Cream, Soda (diet and regular), Sweet Tea, Candies, Chips, Cookies, Store Bought Juices, Alcohol in Excess of  1-2 drinks a day, Artificial Sweeteners, and "Sugar-free" Products. This will help patient to have stable blood glucose profile and potentially avoid unintended weight gain.   -I have approached patient to continue on  intensive monitoring of blood glucose and  Therapy as follows, and patient agrees.  -I advised her to continue Levemir 30 units nightly, associated with monitoring of blood glucose daily before breakfast and as needed.   -Patient is encouraged to call clinic for blood glucose levels less than 70 or above 300 mg /dl.  - I advised her to continue metformin 1000 mg p.o. twice daily after breakfast and supper, therapeutically suitable for patient..  - Patient specific target  for A1c; LDL, HDL, Triglycerides, and  Waist Circumference were discussed in detail.  2) BP/HTN: Her blood pressure is controlled to target.  She is to continue her blood pressure medications including enalapril 40 mg p.o. Daily.   3) Lipids/HPL: Intolerant to statins , her LDL is improving to 93.she is advised to continue continue WelChol.   4)  Weight/Diet: exercise, and  carbohydrates information provided.  5) hypothyroidism:   -Her recent thyroid function tests are consistent with appropriate replacement. I will continue levothyroxine 75 g by mouth every morning.   - We discussed about correct intake of levothyroxine, at fasting, with water, separated by at least 30 minutes from breakfast, and separated by more than 4 hours from calcium, iron, multivitamins, acid reflux medications (PPIs). -Patient is made aware of the fact that thyroid hormone replacement is needed for life, dose to be  adjusted by periodic monitoring of thyroid function tests.  6) Chronic Care/Health Maintenance:  -Patient on ACEI medications and encouraged to continue to follow up with Ophthalmology, Podiatrist at least yearly or according to recommendations, and advised to quit smoking. I have recommended yearly flu vaccine and pneumonia vaccination at least every 5 years; moderate intensity exercise for up to 150 minutes weekly; and  sleep for at least 7 hours a day.  7) history of hyperparathyroidism: She is status post parathyroidectomy. Calcium is stable and normal at 9.  - Time spent with the patient: 25 min, of which >50% was spent in reviewing her blood glucose logs , discussing her hypo- and hyper-glycemic episodes, reviewing her current and  previous labs and insulin doses and developing a plan to avoid hypo- and hyper-glycemia. Please refer to Patient Instructions for Blood Glucose Monitoring and Insulin/Medications Dosing Guide"  in media tab for additional information. Hibba S Carriger participated in the discussions, expressed understanding, and voiced agreement with the above plans.  All questions were answered to her satisfaction. she is encouraged to contact clinic should she have any questions or concerns prior to her return visit.  Follow up plan: Return in about 4 months (around 06/28/2018) for follow up with pre-visit labs, meter, and logs.  Glade Lloyd, MD Phone: [phone]  Fax: [phone]  -  This note was partially dictated with voice recognition software. Similar sounding words can be transcribed inadequately or may not  be corrected upon review.  02/25/2018, 9:45 AM

## 2018-02-26 ENCOUNTER — Other Ambulatory Visit: Payer: Self-pay | Admitting: "Endocrinology

## 2018-03-12 ENCOUNTER — Other Ambulatory Visit: Payer: Self-pay | Admitting: "Endocrinology

## 2018-03-16 ENCOUNTER — Telehealth: Payer: Self-pay

## 2018-03-16 NOTE — Telephone Encounter (Signed)
Patient states its been going on for about 2 weeks

## 2018-03-16 NOTE — Telephone Encounter (Signed)
Returning call.

## 2018-03-16 NOTE — Telephone Encounter (Signed)
Patient contacted office stating she has had excess gas and constipation. Patient wanted to know if the hydralazine could be causing this. Advised patient I would forward to provider, but she would need to contact her PCP regarding this as well.

## 2018-03-16 NOTE — Telephone Encounter (Signed)
When did symptoms start? I agree about contacting PCP.

## 2018-03-16 NOTE — Telephone Encounter (Signed)
LMTCB

## 2018-03-18 NOTE — Telephone Encounter (Signed)
Will await PCP's assessment.

## 2018-04-01 ENCOUNTER — Ambulatory Visit (INDEPENDENT_AMBULATORY_CARE_PROVIDER_SITE_OTHER): Payer: Medicare Other | Admitting: Internal Medicine

## 2018-04-17 ENCOUNTER — Other Ambulatory Visit: Payer: Self-pay | Admitting: "Endocrinology

## 2018-04-22 ENCOUNTER — Encounter (INDEPENDENT_AMBULATORY_CARE_PROVIDER_SITE_OTHER): Payer: Self-pay | Admitting: *Deleted

## 2018-04-22 ENCOUNTER — Encounter (INDEPENDENT_AMBULATORY_CARE_PROVIDER_SITE_OTHER): Payer: Self-pay | Admitting: Internal Medicine

## 2018-04-22 ENCOUNTER — Ambulatory Visit (INDEPENDENT_AMBULATORY_CARE_PROVIDER_SITE_OTHER): Payer: Medicare Other | Admitting: Internal Medicine

## 2018-04-22 VITALS — BP 134/68 | HR 66 | Temp 98.1°F | Resp 18 | Ht 66.0 in | Wt 149.7 lb

## 2018-04-22 DIAGNOSIS — R1013 Epigastric pain: Secondary | ICD-10-CM

## 2018-04-22 DIAGNOSIS — R142 Eructation: Secondary | ICD-10-CM | POA: Diagnosis not present

## 2018-04-22 DIAGNOSIS — R140 Abdominal distension (gaseous): Secondary | ICD-10-CM

## 2018-04-22 NOTE — Patient Instructions (Addendum)
H. Pylori. Gas X Continue the Protonix.

## 2018-04-22 NOTE — Progress Notes (Addendum)
Subjective:    Patient ID: [name], female    DOB: 04-Oct-1950, 67 y.o.   MRN: [id number]  HPI Presents today with c/o of burping. She says she has a lot of gas.  She also c/o lower abdominal pain which radiates into her chest. She has had symptoms for about 2-3 weeks. Sometimes she has urgency which is new. Usually has a BM daily.  She thinks maybe it is the Vitamin D.  Has been taking since the winter. Has been taking a BC powder x 1 a day.  She says she stays bloated. Has lost 4-5 pounds which she says is normal for her in the summer.   She underwent an EGD in April of 2010 for recurrent epigastric pain rt and rt upper quadrant pain. Multiple erosions at gastric body and antrum with scan of the angularis.  H. Pylori was recently was recently negative.  Changes appear to be secondary to excessive consumption of BC Powders.  Her colonoscopy was negative.   Hx of diabetes since 2010.  Retired   Review of Systems Past Medical History:  Diagnosis Date  . Arthritis   . Asthma    takes Singulair nightly  . Diabetes mellitus    takes levemir daily and Metformin daily  . Generalized headaches   . GERD (gastroesophageal reflux disease)    takes Omeprazole daily  . Herniated disc, cervical   . History of kidney stones   . Hyperlipidemia    takes Welchol daily  . Hypertension    takes Enalapril dialy  . Hypothyroidism    takes Synthroid daily  . IBS (irritable bowel syndrome)   . Joint pain    hands and neck  . Nocturia   . PONV (postoperative nausea and vomiting)   . Seizures ([place])    denies  . TIA (transient ischemic attack)    diagnosed by EEG but never knew she had it  . Trigger finger   . Weakness   . Weakness    in both hands    Past Surgical History:  Procedure Laterality Date  . ABDOMINAL HYSTERECTOMY  1980  . ANTERIOR CERVICAL DECOMP/DISCECTOMY FUSION N/A 10/18/2013   Procedure: Cervical three-four, Cervical four-five, Cervical seven-Thoracic one  anterior cervical decompression with fusion plating and bonegraft;  Surgeon: Hosie Spangle, MD;  Location: [facility];  Service: Neurosurgery;  Laterality: N/A;  Cervical three-four, Cervical four-five, Cervical seven-Thoracic one anterior cervical decompression with fusion plating and bonegraft  . APPENDECTOMY     done with hysterectomy  . BACK SURGERY  2007   lumbar fusion  . carpel tunnel Bilateral    left first then right  . cataract implants    . COLONOSCOPY    . ESOPHAGOGASTRODUODENOSCOPY     hx of h pylori  . EYE SURGERY  11/18/2008   right - cataracts  . EYE SURGERY  06/01/2009   left - cataracts  . herniated disc   10/2000, 08/31/2008   C5-C6 then C6-C7  . knee anthroscopy bilateral Bilateral   . KNEE ARTHROSCOPY    . LITHOTRIPSY  11/12/2007   right side  . Parathyroid surgery     October 2013  . PARATHYROIDECTOMY  07/02/2012   Procedure: PARATHYROIDECTOMY;  Surgeon: Earnstine Regal, MD;  Location: [facility];  Service: General;  Laterality: N/A;  . TONSILLECTOMY     as a child    Allergies  Allergen Reactions  . Advair Diskus [Fluticasone-Salmeterol] Anaphylaxis  . Codeine Nausea And Vomiting  .  Hydrocodone-Acetaminophen Nausea And Vomiting  . Oxycodone-Acetaminophen Nausea And Vomiting  . Tetracyclines & Related Itching    Internal    Current Outpatient Medications on File Prior to Visit  Medication Sig Dispense Refill  . amLODipine (NORVASC) 5 MG tablet Take 1 tablet (5 mg total) by mouth daily. 90 tablet 3  . Aspirin-Salicylamide-Caffeine (BC HEADACHE) 325-95-16 MG TABS Take 0.5 packets by mouth daily as needed (FOR PAIN).    Marland Kitchen cholecalciferol (VITAMIN D) 1000 units tablet Take 400 Units by mouth daily.     . enalapril (VASOTEC) 20 MG tablet TAKE 2 TABLETS (40 MG TOTAL) BY MOUTH DAILY. 180 tablet 1  . hydrALAZINE (APRESOLINE) 50 MG tablet Take 50 mg by mouth 2 (two) times daily.  1  . Insulin Detemir (LEVEMIR FLEXTOUCH) 100 UNIT/ML Pen INJECT 30 UNITS INTO THE SKIN  AT BEDTIME 45 mL 0  . Insulin Pen Needle (BD PEN NEEDLE NANO U/F) 32G X 4 MM MISC 1 each by Other route 4 (four) times daily. 200 each 3  . levothyroxine (SYNTHROID, LEVOTHROID) 75 MCG tablet TAKE 1 TABLET (75 MCG TOTAL) BY MOUTH DAILY BEFORE BREAKFAST. 90 tablet 1  . metFORMIN (GLUCOPHAGE) 1000 MG tablet TAKE 1 TABLET (1,000 MG TOTAL) BY MOUTH 2 (TWO) TIMES DAILY WITH A MEAL. 180 tablet 1  . metoprolol tartrate (LOPRESSOR) 50 MG tablet TAKE 1.5 TABLETS (75 MG TOTAL) BY MOUTH 2 (TWO) TIMES DAILY. 270 tablet 3  . montelukast (SINGULAIR) 10 MG tablet Take 10 mg by mouth at bedtime.     . ONE TOUCH ULTRA TEST test strip USE TO TEST BLOOD SUGAR TWO TIMES DAILY E11.65 200 each 2  . ONETOUCH DELICA LANCETS 43P MISC USE AS DIRECTED TWICE A DAY DX:E11.65 100 each 3  . pantoprazole (PROTONIX) 40 MG tablet Take 40 mg by mouth 2 (two) times daily.     . hydrALAZINE (APRESOLINE) 50 MG tablet Take 1 tablet (50 mg total) by mouth 2 (two) times daily. 180 tablet 1   No current facility-administered medications on file prior to visit.         Objective:   Physical Exam Blood pressure 134/68, pulse 66, temperature 98.1 F (36.7 C), temperature source Oral, resp. rate 18, height 5\' 6"  (1.676 m), weight 149 lb 11.2 oz (67.9 kg). Alert and oriented. Skin warm and dry. Oral mucosa is moist.   . Sclera anicteric, conjunctivae is pink. Thyroid not enlarged. No cervical lymphadenopathy. Lungs clear. Heart regular rate and rhythm.  Abdomen is soft. Bowel sounds are positive. No hepatomegaly. No abdominal masses felt. Epigastric tenderness.  No edema to lower extremities.           Assessment & Plan:  Burping, Epigastric pain. Will get an US abdomen Am going to get an H. Pylori.  Further recommendations to follow.   Continue the Protonix BID

## 2018-04-24 DIAGNOSIS — R142 Eructation: Secondary | ICD-10-CM | POA: Diagnosis not present

## 2018-04-26 LAB — H. PYLORI ANTIGEN, STOOL: H pylori Ag, Stl: NEGATIVE

## 2018-04-27 ENCOUNTER — Ambulatory Visit (HOSPITAL_COMMUNITY)
Admission: RE | Admit: 2018-04-27 | Discharge: 2018-04-27 | Disposition: A | Discharge status: Home / Self Care | Payer: Medicare Other | Source: Ambulatory Visit | Attending: Internal Medicine | Admitting: Internal Medicine

## 2018-04-27 DIAGNOSIS — N281 Cyst of kidney, acquired: Secondary | ICD-10-CM | POA: Diagnosis not present

## 2018-04-27 DIAGNOSIS — R1013 Epigastric pain: Secondary | ICD-10-CM | POA: Diagnosis not present

## 2018-04-27 DIAGNOSIS — N200 Calculus of kidney: Secondary | ICD-10-CM | POA: Diagnosis not present

## 2018-04-27 DIAGNOSIS — R140 Abdominal distension (gaseous): Secondary | ICD-10-CM | POA: Insufficient documentation

## 2018-04-27 DIAGNOSIS — R932 Abnormal findings on diagnostic imaging of liver and biliary tract: Secondary | ICD-10-CM | POA: Diagnosis not present

## 2018-04-27 DIAGNOSIS — R142 Eructation: Secondary | ICD-10-CM | POA: Diagnosis not present

## 2018-05-05 ENCOUNTER — Encounter: Payer: Self-pay | Admitting: Cardiovascular Disease

## 2018-05-05 ENCOUNTER — Ambulatory Visit (INDEPENDENT_AMBULATORY_CARE_PROVIDER_SITE_OTHER): Payer: Medicare Other | Admitting: Cardiovascular Disease

## 2018-05-05 VITALS — BP 120/50 | HR 81 | Ht 66.0 in | Wt 149.0 lb

## 2018-05-05 DIAGNOSIS — I10 Essential (primary) hypertension: Secondary | ICD-10-CM | POA: Diagnosis not present

## 2018-05-05 NOTE — Patient Instructions (Signed)

## 2018-05-05 NOTE — Progress Notes (Addendum)
SUBJECTIVE: The patient presents for follow-up of chest pain, shortness of breath, and hypertension.  The patient denies any symptoms of chest pain, palpitations, shortness of breath, lightheadedness, dizziness, leg swelling, orthopnea, PND, and syncope.  She was evaluated by GI and they asked her to stop taking BC powders and since doing so, her excessive gas and constipation have resolved.  There is a strong family history of cardiovascular disease on her mother side of the family.  Her mother underwent carotid endarterectomies and CABG x2 as did at least 2 of her brothers and sisters.  Her mother died of pancreatic cancer.  I reviewed her blood pressure log and systolic readings fluctuate between 120-140/60-80.  Overall there appears to be good control.  ECG performed in the office today which I ordered and personally interpreted demonstrates normal sinus rhythm with nonspecific T wave abnormalities.   Review of Systems: As per "subjective", otherwise negative.  Allergies  Allergen Reactions  . Advair Diskus [Fluticasone-Salmeterol] Anaphylaxis  . Codeine Nausea And Vomiting  . Hydrocodone-Acetaminophen Nausea And Vomiting  . Oxycodone-Acetaminophen Nausea And Vomiting  . Tetracyclines & Related Itching    Internal    Current Outpatient Medications  Medication Sig Dispense Refill  . amLODipine (NORVASC) 5 MG tablet Take 1 tablet (5 mg total) by mouth daily. 90 tablet 3  . cholecalciferol (VITAMIN D) 1000 units tablet Take 400 Units by mouth daily.     . enalapril (VASOTEC) 20 MG tablet TAKE 2 TABLETS (40 MG TOTAL) BY MOUTH DAILY. 180 tablet 1  . hydrALAZINE (APRESOLINE) 50 MG tablet Take 50 mg by mouth 2 (two) times daily.  1  . Insulin Detemir (LEVEMIR FLEXTOUCH) 100 UNIT/ML Pen INJECT 30 UNITS INTO THE SKIN AT BEDTIME 45 mL 0  . Insulin Pen Needle (BD PEN NEEDLE NANO U/F) 32G X 4 MM MISC 1 each by Other route 4 (four) times daily. 200 each 3  . levothyroxine  (SYNTHROID, LEVOTHROID) 75 MCG tablet TAKE 1 TABLET (75 MCG TOTAL) BY MOUTH DAILY BEFORE BREAKFAST. 90 tablet 1  . metFORMIN (GLUCOPHAGE) 1000 MG tablet TAKE 1 TABLET (1,000 MG TOTAL) BY MOUTH 2 (TWO) TIMES DAILY WITH A MEAL. 180 tablet 1  . metoprolol tartrate (LOPRESSOR) 50 MG tablet TAKE 1.5 TABLETS (75 MG TOTAL) BY MOUTH 2 (TWO) TIMES DAILY. 270 tablet 3  . montelukast (SINGULAIR) 10 MG tablet Take 10 mg by mouth at bedtime.     . ONE TOUCH ULTRA TEST test strip USE TO TEST BLOOD SUGAR TWO TIMES DAILY E11.65 200 each 2  . ONETOUCH DELICA LANCETS 16X MISC USE AS DIRECTED TWICE A DAY DX:E11.65 100 each 3  . pantoprazole (PROTONIX) 40 MG tablet Take 40 mg by mouth 2 (two) times daily.      No current facility-administered medications for this visit.     Past Medical History:  Diagnosis Date  . Arthritis   . Asthma    takes Singulair nightly  . Diabetes mellitus    takes levemir daily and Metformin daily  . Generalized headaches   . GERD (gastroesophageal reflux disease)    takes Omeprazole daily  . Herniated disc, cervical   . History of kidney stones   . Hyperlipidemia    takes Welchol daily  . Hypertension    takes Enalapril dialy  . Hypothyroidism    takes Synthroid daily  . IBS (irritable bowel syndrome)   . Joint pain    hands and neck  . Nocturia   .  PONV (postoperative nausea and vomiting)   . Seizures ([place])    denies  . TIA (transient ischemic attack)    diagnosed by EEG but never knew she had it  . Trigger finger   . Weakness   . Weakness    in both hands    Past Surgical History:  Procedure Laterality Date  . ABDOMINAL HYSTERECTOMY  1980  . ANTERIOR CERVICAL DECOMP/DISCECTOMY FUSION N/A 10/18/2013   Procedure: Cervical three-four, Cervical four-five, Cervical seven-Thoracic one anterior cervical decompression with fusion plating and bonegraft;  Surgeon: Hosie Spangle, MD;  Location: [facility];  Service: Neurosurgery;  Laterality: N/A;  Cervical  three-four, Cervical four-five, Cervical seven-Thoracic one anterior cervical decompression with fusion plating and bonegraft  . APPENDECTOMY     done with hysterectomy  . BACK SURGERY  2007   lumbar fusion  . carpel tunnel Bilateral    left first then right  . cataract implants    . COLONOSCOPY    . ESOPHAGOGASTRODUODENOSCOPY     hx of h pylori  . EYE SURGERY  11/18/2008   right - cataracts  . EYE SURGERY  06/01/2009   left - cataracts  . herniated disc   10/2000, 08/31/2008   C5-C6 then C6-C7  . knee anthroscopy bilateral Bilateral   . KNEE ARTHROSCOPY    . LITHOTRIPSY  11/12/2007   right side  . Parathyroid surgery     October 2013  . PARATHYROIDECTOMY  07/02/2012   Procedure: PARATHYROIDECTOMY;  Surgeon: Earnstine Regal, MD;  Location: [facility];  Service: General;  Laterality: N/A;  . TONSILLECTOMY     as a child    Social History   Socioeconomic History  . Marital status: Married    Spouse name: Not on file  . Number of children: Not on file  . Years of education: Not on file  . Highest education level: Not on file  Occupational History  . Not on file  Social Needs  . Financial resource strain: Not on file  . Food insecurity:    Worry: Not on file    Inability: Not on file  . Transportation needs:    Medical: Not on file    Non-medical: Not on file  Tobacco Use  . Smoking status: Former Smoker    Packs/day: 1.00    Years: 40.00    Pack years: 40.00    Last attempt to quit: 07/19/2012    Years since quitting: 5.7  . Smokeless tobacco: Never Used  . Tobacco comment: quit smoking in Oct 2013  Substance and Sexual Activity  . Alcohol use: No    Alcohol/week: 0.0 oz  . Drug use: No  . Sexual activity: Not on file  Lifestyle  . Physical activity:    Days per week: Not on file    Minutes per session: Not on file  . Stress: Not on file  Relationships  . Social connections:    Talks on phone: Not on file    Gets together: Not on file    Attends religious  service: Not on file    Active member of club or organization: Not on file    Attends meetings of clubs or organizations: Not on file    Relationship status: Not on file  . Intimate partner violence:    Fear of current or ex partner: Not on file    Emotionally abused: Not on file    Physically abused: Not on file    Forced sexual activity:  Not on file  Other Topics Concern  . Not on file  Social History Narrative  . Not on file     Vitals:   05/05/18 0812  BP: (!) 120/50  Pulse: 81  SpO2: 98%  Weight: 149 lb (67.6 kg)  Height: 5\' 6"  (1.676 m)    Wt Readings from Last 3 Encounters:  05/05/18 149 lb (67.6 kg)  04/22/18 149 lb 11.2 oz (67.9 kg)  02/25/18 150 lb (68 kg)     PHYSICAL EXAM General: NAD HEENT: Normal. Neck: No JVD, no thyromegaly. Lungs: Clear to auscultation bilaterally with normal respiratory effort. CV: Regular rate and rhythm, normal S1/S2, no S3/S4, no murmur. No pretibial or periankle edema.  No carotid bruit.   Abdomen: Soft, nontender, no distention.  Neurologic: Alert and oriented.  Psych: Normal affect. Skin: Normal. Musculoskeletal: No gross deformities.    ECG: Reviewed above under Subjective   Labs: Lab Results  Component Value Date/Time   K 4.6 02/18/2018 07:35 AM   BUN 15 02/18/2018 07:35 AM   CREATININE 0.95 02/18/2018 07:35 AM   ALT 20 02/18/2018 07:35 AM   TSH 2.66 04/16/2017 07:44 AM   HGB 12.4 04/05/2016 04:16 PM     Lipids: Lab Results  Component Value Date/Time   LDLCALC 93 02/18/2018 07:35 AM   CHOL 149 02/18/2018 07:35 AM   TRIG 169 (H) 02/18/2018 07:35 AM   HDL 29 (L) 02/18/2018 07:35 AM       ASSESSMENT AND PLAN:  1.  Chest pain and shortness of breath: Symptomatically stable.  Blood pressure is controlled.  No changes to therapy.  2.  Chronic hypertension: Blood pressure is normal.  Blood pressure log reviewed above.  No changes to therapy.     Disposition: Follow up 6 months   Kate Sable,  M.D., F.A.C.C.

## 2018-05-08 ENCOUNTER — Other Ambulatory Visit: Payer: Self-pay | Admitting: Cardiovascular Disease

## 2018-05-11 ENCOUNTER — Telehealth: Payer: Self-pay

## 2018-05-11 ENCOUNTER — Other Ambulatory Visit: Payer: Self-pay | Admitting: "Endocrinology

## 2018-05-11 MED ORDER — INSULIN DEGLUDEC 100 UNIT/ML ~~LOC~~ SOPN: 30.0000 [IU] | PEN_INJECTOR | Freq: Every day | SUBCUTANEOUS | 2 refills | Status: DC

## 2018-05-11 NOTE — Telephone Encounter (Signed)
I will send a new prescription to the pharmacy for Tresiba 30 units nightly in place of Levemir.

## 2018-05-11 NOTE — Telephone Encounter (Signed)
[name] states that she was supposed to start a new insulin after she finishes the Levemir?

## 2018-05-14 ENCOUNTER — Encounter (INDEPENDENT_AMBULATORY_CARE_PROVIDER_SITE_OTHER): Payer: Self-pay

## 2018-05-15 ENCOUNTER — Telehealth: Payer: Self-pay | Admitting: "Endocrinology

## 2018-05-15 NOTE — Telephone Encounter (Signed)
[facility] is calling stating that the [name] is not Cheaper at all and she is stating that she wants to continue the Levemir she was doing well with that and it is cheaper than the [place] please advise? She needs a refill on the Levamir sent to [facility] she is asking for a 3 mon supply

## 2018-05-18 ENCOUNTER — Other Ambulatory Visit: Payer: Self-pay

## 2018-05-18 ENCOUNTER — Other Ambulatory Visit: Payer: Self-pay | Admitting: "Endocrinology

## 2018-05-18 MED ORDER — INSULIN DETEMIR 100 UNIT/ML FLEXPEN: 50.0000 [IU] | PEN_INJECTOR | Freq: Every day | SUBCUTANEOUS | 0 refills | Status: DC

## 2018-05-18 MED ORDER — INSULIN DETEMIR 100 UNIT/ML FLEXPEN: 30.0000 [IU] | PEN_INJECTOR | Freq: Every day | SUBCUTANEOUS | 0 refills | Status: DC

## 2018-05-18 NOTE — Telephone Encounter (Signed)
DO NOT CALL IN THE RX SHE WANTS A WRITTEN RX

## 2018-05-21 ENCOUNTER — Telehealth (INDEPENDENT_AMBULATORY_CARE_PROVIDER_SITE_OTHER): Payer: Self-pay | Admitting: Internal Medicine

## 2018-05-21 NOTE — Telephone Encounter (Signed)
No answer. Will call again tomorrow.

## 2018-05-21 NOTE — Telephone Encounter (Signed)
Patient called stated she has been doing everything you told her to do but she is still having issues - please call her at [phone]

## 2018-06-16 DIAGNOSIS — Z1389 Encounter for screening for other disorder: Secondary | ICD-10-CM | POA: Diagnosis not present

## 2018-06-16 DIAGNOSIS — Z0001 Encounter for general adult medical examination with abnormal findings: Secondary | ICD-10-CM | POA: Diagnosis not present

## 2018-06-16 DIAGNOSIS — Z23 Encounter for immunization: Secondary | ICD-10-CM | POA: Diagnosis not present

## 2018-06-16 DIAGNOSIS — Z6824 Body mass index (BMI) 24.0-24.9, adult: Secondary | ICD-10-CM | POA: Diagnosis not present

## 2018-06-18 ENCOUNTER — Other Ambulatory Visit (HOSPITAL_COMMUNITY): Payer: Self-pay | Admitting: Physician Assistant

## 2018-06-18 DIAGNOSIS — Z1231 Encounter for screening mammogram for malignant neoplasm of breast: Secondary | ICD-10-CM

## 2018-06-23 DIAGNOSIS — E1165 Type 2 diabetes mellitus with hyperglycemia: Secondary | ICD-10-CM | POA: Diagnosis not present

## 2018-06-23 DIAGNOSIS — R7309 Other abnormal glucose: Secondary | ICD-10-CM | POA: Diagnosis not present

## 2018-06-23 DIAGNOSIS — E039 Hypothyroidism, unspecified: Secondary | ICD-10-CM | POA: Diagnosis not present

## 2018-06-24 LAB — TSH: TSH: 2.39 m[IU]/L (ref 0.40–4.50)

## 2018-06-24 LAB — COMPLETE METABOLIC PANEL WITH GFR
AG Ratio: 2.3 (calc) (ref 1.0–2.5)
ALBUMIN MSPROF: 4.1 g/dL (ref 3.6–5.1)
ALT: 19 U/L (ref 6–29)
AST: 15 U/L (ref 10–35)
Alkaline phosphatase (APISO): 53 U/L (ref 33–130)
BUN: 14 mg/dL (ref 7–25)
CALCIUM: 9.2 mg/dL (ref 8.6–10.4)
CO2: 28 mmol/L (ref 20–32)
Chloride: 105 mmol/L (ref 98–110)
Creat: 0.79 mg/dL (ref 0.50–0.99)
GFR, EST NON AFRICAN AMERICAN: 77 mL/min/{1.73_m2} (ref >=60)
GFR, Est African American: 89 mL/min/{1.73_m2} (ref >=60)
GLOBULIN: 1.8 g/dL — AB (ref 1.9–3.7)
Glucose, Bld: 118 mg/dL — ABNORMAL HIGH (ref 65–99)
Potassium: 5 mmol/L (ref 3.5–5.3)
SODIUM: 140 mmol/L (ref 135–146)
Total Bilirubin: 0.4 mg/dL (ref 0.2–1.2)
Total Protein: 5.9 g/dL — ABNORMAL LOW (ref 6.1–8.1)

## 2018-06-24 LAB — HEMOGLOBIN A1C
EAG (MMOL/L): 7.7 (calc)
HEMOGLOBIN A1C: 6.5 %{Hb} — AB (ref <5.7)
MEAN PLASMA GLUCOSE: 140 (calc)

## 2018-06-24 LAB — T4, FREE: FREE T4: 1.4 ng/dL (ref 0.8–1.8)

## 2018-06-28 ENCOUNTER — Other Ambulatory Visit: Payer: Self-pay | Admitting: "Endocrinology

## 2018-07-01 ENCOUNTER — Encounter: Payer: Self-pay | Admitting: "Endocrinology

## 2018-07-01 ENCOUNTER — Ambulatory Visit (INDEPENDENT_AMBULATORY_CARE_PROVIDER_SITE_OTHER): Payer: Medicare Other | Admitting: "Endocrinology

## 2018-07-01 VITALS — BP 134/72 | HR 76 | Ht 66.0 in | Wt 148.0 lb

## 2018-07-01 DIAGNOSIS — I10 Essential (primary) hypertension: Secondary | ICD-10-CM

## 2018-07-01 DIAGNOSIS — E1165 Type 2 diabetes mellitus with hyperglycemia: Secondary | ICD-10-CM

## 2018-07-01 DIAGNOSIS — E782 Mixed hyperlipidemia: Secondary | ICD-10-CM

## 2018-07-01 DIAGNOSIS — E039 Hypothyroidism, unspecified: Secondary | ICD-10-CM

## 2018-07-01 NOTE — Progress Notes (Signed)
Subjective:    Patient ID: [name], female    DOB: 03/31/1951,    Past Medical History:  Diagnosis Date  . Arthritis   . Asthma    takes Singulair nightly  . Diabetes mellitus    takes levemir daily and Metformin daily  . Generalized headaches   . GERD (gastroesophageal reflux disease)    takes Omeprazole daily  . Herniated disc, cervical   . History of kidney stones   . Hyperlipidemia    takes Welchol daily  . Hypertension    takes Enalapril dialy  . Hypothyroidism    takes Synthroid daily  . IBS (irritable bowel syndrome)   . Joint pain    hands and neck  . Nocturia   . PONV (postoperative nausea and vomiting)   . Seizures ([place])    denies  . TIA (transient ischemic attack)    diagnosed by EEG but never knew she had it  . Trigger finger   . Weakness   . Weakness    in both hands   Past Surgical History:  Procedure Laterality Date  . ABDOMINAL HYSTERECTOMY  1980  . ANTERIOR CERVICAL DECOMP/DISCECTOMY FUSION N/A 10/18/2013   Procedure: Cervical three-four, Cervical four-five, Cervical seven-Thoracic one anterior cervical decompression with fusion plating and bonegraft;  Surgeon: Hosie Spangle, MD;  Location: [facility];  Service: Neurosurgery;  Laterality: N/A;  Cervical three-four, Cervical four-five, Cervical seven-Thoracic one anterior cervical decompression with fusion plating and bonegraft  . APPENDECTOMY     done with hysterectomy  . BACK SURGERY  2007   lumbar fusion  . carpel tunnel Bilateral    left first then right  . cataract implants    . COLONOSCOPY    . ESOPHAGOGASTRODUODENOSCOPY     hx of h pylori  . EYE SURGERY  11/18/2008   right - cataracts  . EYE SURGERY  06/01/2009   left - cataracts  . herniated disc   10/2000, 08/31/2008   C5-C6 then C6-C7  . knee anthroscopy bilateral Bilateral   . KNEE ARTHROSCOPY    . LITHOTRIPSY  11/12/2007   right side  . Parathyroid surgery     October 2013  . PARATHYROIDECTOMY  07/02/2012   Procedure: PARATHYROIDECTOMY;  Surgeon: Earnstine Regal, MD;  Location: [facility];  Service: General;  Laterality: N/A;  . TONSILLECTOMY     as a child   Social History   Socioeconomic History  . Marital status: Married    Spouse name: Not on file  . Number of children: Not on file  . Years of education: Not on file  . Highest education level: Not on file  Occupational History  . Not on file  Social Needs  . Financial resource strain: Not on file  . Food insecurity:    Worry: Not on file    Inability: Not on file  . Transportation needs:    Medical: Not on file    Non-medical: Not on file  Tobacco Use  . Smoking status: Former Smoker    Packs/day: 1.00    Years: 40.00    Pack years: 40.00    Last attempt to quit: 07/19/2012    Years since quitting: 5.9  . Smokeless tobacco: Never Used  . Tobacco comment: quit smoking in Oct 2013  Substance and Sexual Activity  . Alcohol use: No    Alcohol/week: 0.0 standard drinks  . Drug use: No  . Sexual activity: Not on file  Lifestyle  .  Physical activity:    Days per week: Not on file    Minutes per session: Not on file  . Stress: Not on file  Relationships  . Social connections:    Talks on phone: Not on file    Gets together: Not on file    Attends religious service: Not on file    Active member of club or organization: Not on file    Attends meetings of clubs or organizations: Not on file    Relationship status: Not on file  Other Topics Concern  . Not on file  Social History Narrative  . Not on file   Outpatient Encounter Medications as of 07/01/2018  Medication Sig  . amLODipine (NORVASC) 5 MG tablet Take 1 tablet (5 mg total) by mouth daily.  . cholecalciferol (VITAMIN D) 1000 units tablet Take 400 Units by mouth daily.   . enalapril (VASOTEC) 20 MG tablet TAKE 2 TABLETS (40 MG TOTAL) BY MOUTH DAILY.  . hydrALAZINE (APRESOLINE) 50 MG tablet Take 50 mg by mouth 2 (two) times daily.  . Insulin Detemir (LEVEMIR FLEXTOUCH)  100 UNIT/ML Pen Inject 50 Units into the skin daily. Inject up to 50 units qhs  . Insulin Pen Needle (BD PEN NEEDLE NANO U/F) 32G X 4 MM MISC 1 each by Other route 4 (four) times daily.  [place] levothyroxine (SYNTHROID, LEVOTHROID) 75 MCG tablet TAKE 1 TABLET (75 MCG TOTAL) BY MOUTH DAILY BEFORE BREAKFAST.  . metFORMIN (GLUCOPHAGE) 1000 MG tablet TAKE 1 TABLET (1,000 MG TOTAL) BY MOUTH 2 (TWO) TIMES DAILY WITH A MEAL.  . metoprolol tartrate (LOPRESSOR) 50 MG tablet TAKE 1.5 TABLETS (75 MG TOTAL) BY MOUTH 2 (TWO) TIMES DAILY.  . montelukast (SINGULAIR) 10 MG tablet Take 10 mg by mouth at bedtime.   . ONE TOUCH ULTRA TEST test strip USE TO TEST BLOOD SUGAR TWO TIMES DAILY E11.65  . ONETOUCH DELICA LANCETS 22G MISC USE AS DIRECTED TWICE A DAY DX:E11.65  . pantoprazole (PROTONIX) 40 MG tablet Take 40 mg by mouth 2 (two) times daily.    No facility-administered encounter medications on file as of 07/01/2018.    ALLERGIES: Allergies  Allergen Reactions  . Advair Diskus [Fluticasone-Salmeterol] Anaphylaxis  . Codeine Nausea And Vomiting  . Hydrocodone-Acetaminophen Nausea And Vomiting  . Oxycodone-Acetaminophen Nausea And Vomiting  . Tetracyclines & Related Itching    Internal   VACCINATION STATUS: Immunization History  Administered Date(s) Administered  . Tdap 07/12/2014    Diabetes  She presents for her follow-up diabetic visit. She has type 2 diabetes mellitus. Onset time: Diagnosed approximately at age  4 years. Her disease course has been stable. Pertinent negatives for hypoglycemia include no confusion, headaches, pallor or seizures. Pertinent negatives for diabetes include no chest pain, no fatigue, no polydipsia, no polyphagia and no polyuria. Symptoms are stable. There are no diabetic complications. Risk factors for coronary artery disease include tobacco exposure, hypertension, dyslipidemia, diabetes mellitus and post-menopausal. Current diabetic treatment includes insulin injections and  oral agent (monotherapy). She is compliant with treatment most of the time. Her weight is stable. She is following a generally unhealthy diet. Her home blood glucose trend is decreasing steadily. Her breakfast blood glucose range is generally 130-140 mg/dl. Her overall blood glucose range is 130-140 mg/dl. An ACE inhibitor/angiotensin II receptor blocker is being taken.  Hyperlipidemia  This is a chronic problem. The current episode started more than 1 year ago. The problem is uncontrolled. Pertinent negatives include no chest pain, myalgias or shortness of  breath. Current antihyperlipidemic treatment includes bile acid squestrants. Risk factors for coronary artery disease include dyslipidemia, diabetes mellitus, hypertension, a sedentary lifestyle and post-menopausal.  Hypertension  This is a chronic problem. The current episode started more than 1 year ago. The problem is controlled. Pertinent negatives include no chest pain, headaches, palpitations or shortness of breath. Risk factors for coronary artery disease include dyslipidemia, diabetes mellitus, sedentary lifestyle, smoking/tobacco exposure and post-menopausal state. Past treatments include ACE inhibitors.     Review of Systems  Constitutional: Negative for fatigue and unexpected weight change.  HENT: Negative for trouble swallowing and voice change.   Eyes: Negative for visual disturbance.  Respiratory: Negative for cough, shortness of breath and wheezing.   Cardiovascular: Negative for chest pain, palpitations and leg swelling.  Gastrointestinal: Negative for diarrhea, nausea and vomiting.  Endocrine: Negative for cold intolerance, heat intolerance, polydipsia, polyphagia and polyuria.  Musculoskeletal: Negative for arthralgias and myalgias.  Skin: Negative for color change, pallor, rash and wound.  Neurological: Negative for seizures and headaches.  Psychiatric/Behavioral: Negative for confusion and suicidal ideas.    Objective:     BP 134/72   Pulse 76   Ht 5\' 6"  (1.676 m)   Wt 148 lb (67.1 kg)   BMI 23.89 kg/m   Wt Readings from Last 3 Encounters:  07/01/18 148 lb (67.1 kg)  05/05/18 149 lb (67.6 kg)  04/22/18 149 lb 11.2 oz (67.9 kg)    Physical Exam  Constitutional: She is oriented to person, place, and time. She appears well-developed.  HENT:  Head: Normocephalic and atraumatic.  Eyes: EOM are normal.  Neck: Normal range of motion. Neck supple. No tracheal deviation present. No thyromegaly present.  Cardiovascular: Normal rate.  Pulmonary/Chest: Effort normal.  Abdominal: There is no tenderness. There is no guarding.  Musculoskeletal: Normal range of motion. She exhibits no edema.  Neurological: She is alert and oriented to person, place, and time. She has normal reflexes. No cranial nerve deficit. Coordination normal.  Skin: Skin is warm and dry. No rash noted. No erythema. No pallor.  Psychiatric: She has a normal mood and affect. Judgment normal.    Results for orders placed or performed in visit on 04/22/18  H. pylori antigen, stool  Result Value Ref Range   H pylori Ag, Stl Negative Negative   Complete Blood Count (Most recent): Lab Results  Component Value Date   WBC 7.4 04/05/2016   HGB 12.4 04/05/2016   HCT 39.1 04/05/2016   MCV 79.6 04/05/2016   PLT 233 04/05/2016   Diabetic Labs (most recent): Lab Results  Component Value Date   HGBA1C 6.5 (H) 06/23/2018   HGBA1C 6.5 (H) 02/18/2018   HGBA1C 6.7 (H) 08/19/2017   Lipid Panel     Component Value Date/Time   CHOL 149 02/18/2018 0735   TRIG 169 (H) 02/18/2018 0735   HDL 29 (L) 02/18/2018 0735   CHOLHDL 5.1 (H) 02/18/2018 0735   VLDL 33 (H) 10/14/2016 0721   LDLCALC 93 02/18/2018 0735    Assessment & Plan:   1. Uncontrolled type 2 diabetes mellitus without complication, with long-term current use of insulin ([place])  She returns with stable A1c of 6.5%, generally improving from 11.5%.      Glucose logs and insulin  administration records pertaining to this visit,  to be scanned into patient's records.  Recent labs reviewed. -Patient remains at a high risk for more acute and chronic complications of diabetes which include CAD, CVA, CKD, retinopathy, and neuropathy. These  are all discussed in detail with the patient. -I have re-counseled the patient on diet management and weight loss, by adopting a carbohydrate restricted / protein rich  Diet. Patient is advised to stick to a routine mealtimes to eat 3 meals  a day and avoid unnecessary snacks ( to snack only to correct hypoglycemia).  -  Suggestion is made for her to avoid simple carbohydrates  from her diet including Cakes, Sweet Desserts / Pastries, Ice Cream, Soda (diet and regular), Sweet Tea, Candies, Chips, Cookies, Store Bought Juices, Alcohol in Excess of  1-2 drinks a day, Artificial Sweeteners, and "Sugar-free" Products. This will help patient to have stable blood glucose profile and potentially avoid unintended weight gain.   -I have approached patient to continue on  intensive monitoring of blood glucose and  Therapy as follows, and patient agrees.  -Her insurance did not cover for [place].  She would like to stay on Levemir.  I discussed and continued Levemir 30 units nightly, associated with monitoring of blood glucose daily before breakfast and as needed.   -Patient is encouraged to call clinic for blood glucose levels less than 70 or above 300 mg /dl.  - I advised her to continue metformin 1000 mg p.o. twice daily after breakfast and supper, therapeutically suitable for patient..  - Patient specific target  for A1c; LDL, HDL, Triglycerides, and  Waist Circumference were discussed in detail.  2) BP/HTN: Her blood pressure is controlled to target.  She is advised to continue her blood pressure medications including enalapril 40 mg p.o. Daily.   3) Lipids/HPL: Intolerant to statins , her LDL is improving to 93.she is advised to continue   WelChol.   4)  Weight/Diet: exercise, and carbohydrates information provided.  5) hypothyroidism:   -Her recent thyroid function tests are consistent with appropriate replacement. I will continue levothyroxine 75 g by mouth every morning.   - We discussed about correct intake of levothyroxine, at fasting, with water, separated by at least 30 minutes from breakfast, and separated by more than 4 hours from calcium, iron, multivitamins, acid reflux medications (PPIs). -Patient is made aware of the fact that thyroid hormone replacement is needed for life, dose to be adjusted by periodic monitoring of thyroid function tests.  6) Chronic Care/Health Maintenance:  -Patient on ACEI medications and encouraged to continue to follow up with Ophthalmology, Podiatrist at least yearly or according to recommendations, and advised to quit smoking. I have recommended yearly flu vaccine and pneumonia vaccination at least every 5 years; moderate intensity exercise for up to 150 minutes weekly; and  sleep for at least 7 hours a day.  7) history of hyperparathyroidism: She is status post parathyroidectomy. Calcium is stable and normal at 9.  - Time spent with the patient: 25 min, of which >50% was spent in reviewing her blood glucose logs , discussing her hypo- and hyper-glycemic episodes, reviewing her current and  previous labs and insulin doses and developing a plan to avoid hypo- and hyper-glycemia. Please refer to Patient Instructions for Blood Glucose Monitoring and Insulin/Medications Dosing Guide"  in media tab for additional information. Rechelle S Gainor participated in the discussions, expressed understanding, and voiced agreement with the above plans.  All questions were answered to her satisfaction. she is encouraged to contact clinic should she have any questions or concerns prior to her return visit.   Follow up plan: No follow-ups on file.  Glade Lloyd, MD Phone: [phone]  Fax: [phone]  -   This  note was partially dictated with voice recognition software. Similar sounding words can be transcribed inadequately or may not  be corrected upon review.  07/01/2018, 7:00 PM

## 2018-07-01 NOTE — Patient Instructions (Signed)

## 2018-07-14 ENCOUNTER — Other Ambulatory Visit: Payer: Self-pay | Admitting: Cardiovascular Disease

## 2018-07-14 ENCOUNTER — Other Ambulatory Visit: Payer: Self-pay | Admitting: "Endocrinology

## 2018-07-16 ENCOUNTER — Telehealth: Payer: Self-pay | Admitting: "Endocrinology

## 2018-07-16 MED ORDER — ONETOUCH DELICA LANCETS 33G MISC: 3 refills | Status: DC

## 2018-07-16 NOTE — Telephone Encounter (Signed)
[name] needs a new Rx for her One Touch [place] Lancets sent to [facility] in [place] (switched Pharmacies) please advise?

## 2018-07-23 ENCOUNTER — Other Ambulatory Visit: Payer: Self-pay

## 2018-07-23 MED ORDER — ONETOUCH DELICA LANCETS 33G MISC: 5 refills | Status: DC

## 2018-07-27 ENCOUNTER — Telehealth: Payer: Self-pay | Admitting: "Endocrinology

## 2018-07-27 NOTE — Telephone Encounter (Signed)
Yes, advise her to increase Levemir to 40 units qhs and stay off of metformin.

## 2018-07-27 NOTE — Telephone Encounter (Signed)
[name] is stating that the 500 mg 2 times a day and she states that the stomach issues and diarrhea has ceased but her blood sugars are starting climbing   Fri 10/25 168   Sat 10/26 174   Sun 10/27 190  Mon 10/28 186  Should she be worried or should she make any changes? Please advise?

## 2018-07-28 NOTE — Telephone Encounter (Signed)
Patient is aware of the recommendations 

## 2018-08-12 ENCOUNTER — Telehealth: Payer: Self-pay

## 2018-08-12 NOTE — Telephone Encounter (Signed)
Pt states her Levemir was supposed to be changed. Which insulin should this be changed to?

## 2018-08-17 ENCOUNTER — Telehealth: Payer: Self-pay | Admitting: Cardiovascular Disease

## 2018-08-17 NOTE — Telephone Encounter (Signed)
Patient wants to make sure we [facility] not [facility] to [facility].

## 2018-08-17 NOTE — Telephone Encounter (Signed)
Pt wanted to make sure that we only send meds to [facility] in [place] - updated preferred pharmacy

## 2018-08-17 NOTE — Telephone Encounter (Signed)
However, during her last visit she informed me that [facility] was not properly covered and that she wishes to stay on her Levemir.  If her insurance provides coverage, [facility] will be her next  best option.

## 2018-08-18 NOTE — Telephone Encounter (Signed)
Called pt. No answer °

## 2018-08-21 NOTE — Telephone Encounter (Signed)
Pt.notified

## 2018-08-25 ENCOUNTER — Telehealth: Payer: Self-pay | Admitting: "Endocrinology

## 2018-08-25 MED ORDER — INSULIN DETEMIR 100 UNIT/ML FLEXPEN: 50.0000 [IU] | PEN_INJECTOR | Freq: Every day | SUBCUTANEOUS | 0 refills | Status: DC

## 2018-08-25 NOTE — Telephone Encounter (Signed)
Rx sent 

## 2018-08-25 NOTE — Telephone Encounter (Signed)
[name] is requesting a refill on insulin, she is asking for Dr. Dorris Fetch to write for 45 mg so she can get 3 boxes, please send to [facility], please advise

## 2018-09-02 ENCOUNTER — Ambulatory Visit (INDEPENDENT_AMBULATORY_CARE_PROVIDER_SITE_OTHER): Payer: Medicare Other | Admitting: "Endocrinology

## 2018-09-02 ENCOUNTER — Encounter: Payer: Self-pay | Admitting: "Endocrinology

## 2018-09-02 VITALS — BP 148/63 | HR 76 | Ht 66.0 in | Wt 155.0 lb

## 2018-09-02 DIAGNOSIS — E1165 Type 2 diabetes mellitus with hyperglycemia: Secondary | ICD-10-CM | POA: Diagnosis not present

## 2018-09-02 NOTE — Progress Notes (Signed)
Subjective:    Patient ID: [name], female    DOB: Apr 26, 1951,    Past Medical History:  Diagnosis Date  . Arthritis   . Asthma    takes Singulair nightly  . Diabetes mellitus    takes levemir daily and Metformin daily  . Generalized headaches   . GERD (gastroesophageal reflux disease)    takes Omeprazole daily  . Herniated disc, cervical   . History of kidney stones   . Hyperlipidemia    takes Welchol daily  . Hypertension    takes Enalapril dialy  . Hypothyroidism    takes Synthroid daily  . IBS (irritable bowel syndrome)   . Joint pain    hands and neck  . Nocturia   . PONV (postoperative nausea and vomiting)   . Seizures ([place])    denies  . TIA (transient ischemic attack)    diagnosed by EEG but never knew she had it  . Trigger finger   . Weakness   . Weakness    in both hands   Past Surgical History:  Procedure Laterality Date  . ABDOMINAL HYSTERECTOMY  1980  . ANTERIOR CERVICAL DECOMP/DISCECTOMY FUSION N/A 10/18/2013   Procedure: Cervical three-four, Cervical four-five, Cervical seven-Thoracic one anterior cervical decompression with fusion plating and bonegraft;  Surgeon: Hosie Spangle, MD;  Location: [facility];  Service: Neurosurgery;  Laterality: N/A;  Cervical three-four, Cervical four-five, Cervical seven-Thoracic one anterior cervical decompression with fusion plating and bonegraft  . APPENDECTOMY     done with hysterectomy  . BACK SURGERY  2007   lumbar fusion  . carpel tunnel Bilateral    left first then right  . cataract implants    . COLONOSCOPY    . ESOPHAGOGASTRODUODENOSCOPY     hx of h pylori  . EYE SURGERY  11/18/2008   right - cataracts  . EYE SURGERY  06/01/2009   left - cataracts  . herniated disc   10/2000, 08/31/2008   C5-C6 then C6-C7  . knee anthroscopy bilateral Bilateral   . KNEE ARTHROSCOPY    . LITHOTRIPSY  11/12/2007   right side  . Parathyroid surgery     October 2013  . PARATHYROIDECTOMY  07/02/2012   Procedure: PARATHYROIDECTOMY;  Surgeon: Earnstine Regal, MD;  Location: [facility];  Service: General;  Laterality: N/A;  . TONSILLECTOMY     as a child   Social History   Socioeconomic History  . Marital status: Married    Spouse name: Not on file  . Number of children: Not on file  . Years of education: Not on file  . Highest education level: Not on file  Occupational History  . Not on file  Social Needs  . Financial resource strain: Not on file  . Food insecurity:    Worry: Not on file    Inability: Not on file  . Transportation needs:    Medical: Not on file    Non-medical: Not on file  Tobacco Use  . Smoking status: Former Smoker    Packs/day: 1.00    Years: 40.00    Pack years: 40.00    Last attempt to quit: 07/19/2012    Years since quitting: 6.1  . Smokeless tobacco: Never Used  . Tobacco comment: quit smoking in Oct 2013  Substance and Sexual Activity  . Alcohol use: No    Alcohol/week: 0.0 standard drinks  . Drug use: No  . Sexual activity: Not on file  Lifestyle  .  Physical activity:    Days per week: Not on file    Minutes per session: Not on file  . Stress: Not on file  Relationships  . Social connections:    Talks on phone: Not on file    Gets together: Not on file    Attends religious service: Not on file    Active member of club or organization: Not on file    Attends meetings of clubs or organizations: Not on file    Relationship status: Not on file  Other Topics Concern  . Not on file  Social History Narrative  . Not on file   Outpatient Encounter Medications as of 09/02/2018  Medication Sig  . amLODipine (NORVASC) 5 MG tablet Take 1 tablet (5 mg total) by mouth daily.  . cholecalciferol (VITAMIN D) 1000 units tablet Take 400 Units by mouth 2 (two) times daily.   . enalapril (VASOTEC) 20 MG tablet TAKE 2 TABLETS (40 MG TOTAL) BY MOUTH DAILY.  . hydrALAZINE (APRESOLINE) 50 MG tablet Take 50 mg by mouth 2 (two) times daily.  . Insulin Detemir  (LEVEMIR FLEXTOUCH) 100 UNIT/ML Pen Inject 50 Units into the skin daily. Inject up to 50 units qhs  . Insulin Pen Needle (BD PEN NEEDLE NANO U/F) 32G X 4 MM MISC 1 each by Other route 4 (four) times daily.  Marland Kitchen levothyroxine (SYNTHROID, LEVOTHROID) 75 MCG tablet TAKE 1 TABLET (75 MCG TOTAL) BY MOUTH DAILY BEFORE BREAKFAST.  . metFORMIN (GLUCOPHAGE) 1000 MG tablet TAKE 1 TABLET (1,000 MG TOTAL) BY MOUTH 2 (TWO) TIMES DAILY WITH A MEAL.  . metoprolol tartrate (LOPRESSOR) 50 MG tablet TAKE 1.5 TABLETS (75 MG TOTAL) BY MOUTH 2 (TWO) TIMES DAILY.  . montelukast (SINGULAIR) 10 MG tablet Take 10 mg by mouth at bedtime.   . ONE TOUCH ULTRA TEST test strip USE TO TEST BLOOD SUGAR TWO TIMES DAILY E11.65  . ONETOUCH DELICA LANCETS 42H MISC USE AS DIRECTED TWICE A DAY DX:E11.65  . pantoprazole (PROTONIX) 40 MG tablet Take 40 mg by mouth 2 (two) times daily.   . [DISCONTINUED] hydrALAZINE (APRESOLINE) 50 MG tablet TAKE 1 TABLET BY MOUTH TWICE A DAY   No facility-administered encounter medications on file as of 09/02/2018.    ALLERGIES: Allergies  Allergen Reactions  . Advair Diskus [Fluticasone-Salmeterol] Anaphylaxis  . Codeine Nausea And Vomiting  . Hydrocodone-Acetaminophen Nausea And Vomiting  . Oxycodone-Acetaminophen Nausea And Vomiting  . Tetracyclines & Related Itching    Internal   VACCINATION STATUS: Immunization History  Administered Date(s) Administered  . Tdap 07/12/2014    Diabetes  She presents for her follow-up (Patient presents complaining of diffuse body pain poorly localized-legs, shoulders, back.) diabetic visit. She has type 2 diabetes mellitus. Onset time: Diagnosed approximately at age  67 years. Pertinent negatives for hypoglycemia include no confusion, headaches, pallor or seizures. Pertinent negatives for diabetes include no chest pain, no fatigue, no polydipsia, no polyphagia and no polyuria. Her weight is stable.  Hyperlipidemia  This is a chronic problem. The current  episode started more than 1 year ago. The problem is uncontrolled. Pertinent negatives include no chest pain, myalgias or shortness of breath. Current antihyperlipidemic treatment includes bile acid squestrants. Risk factors for coronary artery disease include dyslipidemia, diabetes mellitus, hypertension, a sedentary lifestyle and post-menopausal.  Hypertension  This is a chronic problem. The current episode started more than 1 year ago. The problem is controlled. Pertinent negatives include no chest pain, headaches, palpitations or shortness of breath. Risk factors  for coronary artery disease include dyslipidemia, diabetes mellitus, sedentary lifestyle, smoking/tobacco exposure and post-menopausal state. Past treatments include ACE inhibitors.    Review of Systems  Constitutional: Negative for fatigue and unexpected weight change.  HENT: Negative for trouble swallowing and voice change.   Eyes: Negative for visual disturbance.  Respiratory: Negative for cough, chest tightness, shortness of breath and wheezing.   Cardiovascular: Negative for chest pain, palpitations and leg swelling.  Gastrointestinal: Negative for diarrhea, nausea and vomiting.  Endocrine: Negative for cold intolerance, heat intolerance, polydipsia, polyphagia and polyuria.  Musculoskeletal: Positive for arthralgias and back pain. Negative for myalgias.  Skin: Negative for color change, pallor, rash and wound.  Neurological: Negative for seizures and headaches.  Psychiatric/Behavioral: Negative for confusion and suicidal ideas.    Objective:    BP (!) 148/63   Pulse 76   Ht 5\' 6"  (1.676 m)   Wt 155 lb (70.3 kg)   BMI 25.02 kg/m   Wt Readings from Last 3 Encounters:  09/02/18 155 lb (70.3 kg)  07/01/18 148 lb (67.1 kg)  05/05/18 149 lb (67.6 kg)    Physical Exam  Constitutional: She is oriented to person, place, and time. She appears well-developed.  HENT:  Head: Normocephalic and atraumatic.  Eyes: EOM are  normal.  Neck: Normal range of motion. Neck supple. No tracheal deviation present. No thyromegaly present.  Cardiovascular: Normal rate.  Pulmonary/Chest: Effort normal.  Abdominal: There is no tenderness. There is no guarding.  Musculoskeletal: Normal range of motion. She exhibits no edema.  Neurological: She is alert and oriented to person, place, and time. She has normal reflexes. No cranial nerve deficit. Coordination normal.  Skin: Skin is warm and dry. No rash noted. No erythema. No pallor.  Psychiatric: She has a normal mood and affect. Judgment normal.    Results for orders placed or performed in visit on 04/22/18  H. pylori antigen, stool  Result Value Ref Range   H pylori Ag, Stl Negative Negative   Complete Blood Count (Most recent): Lab Results  Component Value Date   WBC 7.4 04/05/2016   HGB 12.4 04/05/2016   HCT 39.1 04/05/2016   MCV 79.6 04/05/2016   PLT 233 04/05/2016   Diabetic Labs (most recent): Lab Results  Component Value Date   HGBA1C 6.5 (H) 06/23/2018   HGBA1C 6.5 (H) 02/18/2018   HGBA1C 6.7 (H) 08/19/2017   Lipid Panel     Component Value Date/Time   CHOL 149 02/18/2018 0735   TRIG 169 (H) 02/18/2018 0735   HDL 29 (L) 02/18/2018 0735   CHOLHDL 5.1 (H) 02/18/2018 0735   VLDL 33 (H) 10/14/2016 0721   LDLCALC 93 02/18/2018 0735    Assessment & Plan:   1.  Poorly localized diffuse body pain: - involving bilateral legs bilateral shoulders, and her back -No diagnosis at this time.  He thinks it is related to her history of primary hyperparathyroidism with hypercalcemia.  She is reassured that her parathyroidectomy was successful with controlled calcium at 9.2. -I did not initiate any prescription for pain this time.  He is a former smoker, I advised her to return to emergency room if she develops chest pain or chest pain.  Resting oxygen saturation is 97%. -She will be seen for her regular follow-up of type 2 diabetes, hypothyroidism in 5 weeks with  repeat labs.  Follow up plan: Return in about 5 weeks (around 10/07/2018) for Meter, and Logs.  Glade Lloyd, MD Phone: [phone]  Fax: [phone]  -  This note was partially dictated with voice recognition software. Similar sounding words can be transcribed inadequately or may not  be corrected upon review.  09/02/2018, 4:27 PM

## 2018-09-21 DIAGNOSIS — E559 Vitamin D deficiency, unspecified: Secondary | ICD-10-CM | POA: Diagnosis not present

## 2018-09-21 DIAGNOSIS — E119 Type 2 diabetes mellitus without complications: Secondary | ICD-10-CM | POA: Diagnosis not present

## 2018-09-21 DIAGNOSIS — F331 Major depressive disorder, recurrent, moderate: Secondary | ICD-10-CM | POA: Diagnosis not present

## 2018-09-21 DIAGNOSIS — R5383 Other fatigue: Secondary | ICD-10-CM | POA: Diagnosis not present

## 2018-09-21 DIAGNOSIS — I10 Essential (primary) hypertension: Secondary | ICD-10-CM | POA: Diagnosis not present

## 2018-09-21 DIAGNOSIS — R05 Cough: Secondary | ICD-10-CM | POA: Diagnosis not present

## 2018-09-21 DIAGNOSIS — M542 Cervicalgia: Secondary | ICD-10-CM | POA: Diagnosis not present

## 2018-09-21 DIAGNOSIS — R1310 Dysphagia, unspecified: Secondary | ICD-10-CM | POA: Diagnosis not present

## 2018-09-21 DIAGNOSIS — E039 Hypothyroidism, unspecified: Secondary | ICD-10-CM | POA: Diagnosis not present

## 2018-09-21 DIAGNOSIS — R6882 Decreased libido: Secondary | ICD-10-CM | POA: Diagnosis not present

## 2018-09-24 ENCOUNTER — Telehealth: Payer: Self-pay

## 2018-09-24 NOTE — Telephone Encounter (Signed)
[name] is calling stating that her blood sugars are " running a bit high again" and she has increased the Metformin to 1500 mg daily again to see if it will affect her stomach issue

## 2018-09-24 NOTE — Telephone Encounter (Signed)
Ok. Noted 

## 2018-09-29 ENCOUNTER — Telehealth: Payer: Self-pay | Admitting: Cardiovascular Disease

## 2018-09-29 NOTE — Telephone Encounter (Signed)
Can call either number  Asking to have Dr Bronson Ing call her  Re:  She has been having issues with her health and she went to see Dr Anastasio Champion She wants Anastasio Champion to handle her diabetes along with her BP issues.  Doesn't want Dr Bronson Ing  to think that she is asking because he has done anything wrong.. She just would like for Dr Anastasio Champion to handle all unless she develops more heart problems other than BP, then she would come back to Dr Bronson Ing

## 2018-10-01 ENCOUNTER — Other Ambulatory Visit (HOSPITAL_COMMUNITY): Payer: Self-pay | Admitting: Internal Medicine

## 2018-10-01 ENCOUNTER — Telehealth: Payer: Self-pay

## 2018-10-01 DIAGNOSIS — Z1231 Encounter for screening mammogram for malignant neoplasm of breast: Secondary | ICD-10-CM

## 2018-10-01 NOTE — Telephone Encounter (Signed)
[name] is calling to state that she will no longer be a patient here that Dr. Anastasio Champion will be taking over all of her medical needs at this time

## 2018-10-01 NOTE — Telephone Encounter (Signed)
Ok. Noted 

## 2018-10-02 NOTE — Telephone Encounter (Signed)
Patient notified

## 2018-10-02 NOTE — Telephone Encounter (Signed)
No problem whatsoever.

## 2018-10-08 ENCOUNTER — Ambulatory Visit: Payer: Medicare Other | Admitting: "Endocrinology

## 2018-10-20 DIAGNOSIS — E119 Type 2 diabetes mellitus without complications: Secondary | ICD-10-CM | POA: Diagnosis not present

## 2018-10-20 DIAGNOSIS — E039 Hypothyroidism, unspecified: Secondary | ICD-10-CM | POA: Diagnosis not present

## 2018-11-02 ENCOUNTER — Ambulatory Visit: Payer: Medicare Other | Admitting: "Endocrinology

## 2018-11-04 DIAGNOSIS — E039 Hypothyroidism, unspecified: Secondary | ICD-10-CM | POA: Diagnosis not present

## 2018-11-04 DIAGNOSIS — E119 Type 2 diabetes mellitus without complications: Secondary | ICD-10-CM | POA: Diagnosis not present

## 2018-11-04 DIAGNOSIS — E559 Vitamin D deficiency, unspecified: Secondary | ICD-10-CM | POA: Diagnosis not present

## 2018-11-04 DIAGNOSIS — I10 Essential (primary) hypertension: Secondary | ICD-10-CM | POA: Diagnosis not present

## 2018-11-04 DIAGNOSIS — F331 Major depressive disorder, recurrent, moderate: Secondary | ICD-10-CM | POA: Diagnosis not present

## 2018-11-09 ENCOUNTER — Ambulatory Visit (HOSPITAL_COMMUNITY)
Admission: RE | Admit: 2018-11-09 | Discharge: 2018-11-09 | Disposition: A | Discharge status: Home / Self Care | Payer: Medicare Other | Source: Ambulatory Visit | Attending: Internal Medicine | Admitting: Internal Medicine

## 2018-11-09 DIAGNOSIS — Z1231 Encounter for screening mammogram for malignant neoplasm of breast: Secondary | ICD-10-CM | POA: Insufficient documentation

## 2018-11-10 ENCOUNTER — Other Ambulatory Visit (HOSPITAL_COMMUNITY): Payer: Self-pay | Admitting: Internal Medicine

## 2018-11-10 DIAGNOSIS — R928 Other abnormal and inconclusive findings on diagnostic imaging of breast: Secondary | ICD-10-CM

## 2018-11-12 ENCOUNTER — Other Ambulatory Visit: Payer: Self-pay | Admitting: Cardiovascular Disease

## 2018-11-16 ENCOUNTER — Other Ambulatory Visit: Payer: Self-pay | Admitting: Cardiovascular Disease

## 2018-11-19 ENCOUNTER — Ambulatory Visit (HOSPITAL_COMMUNITY)
Admission: RE | Admit: 2018-11-19 | Discharge: 2018-11-19 | Disposition: A | Discharge status: Home / Self Care | Payer: Medicare Other | Source: Ambulatory Visit | Attending: Internal Medicine | Admitting: Internal Medicine

## 2018-11-19 DIAGNOSIS — N6489 Other specified disorders of breast: Secondary | ICD-10-CM | POA: Diagnosis not present

## 2018-11-19 DIAGNOSIS — R922 Inconclusive mammogram: Secondary | ICD-10-CM | POA: Diagnosis not present

## 2018-11-19 DIAGNOSIS — R928 Other abnormal and inconclusive findings on diagnostic imaging of breast: Secondary | ICD-10-CM | POA: Insufficient documentation

## 2018-11-26 DIAGNOSIS — Z961 Presence of intraocular lens: Secondary | ICD-10-CM | POA: Diagnosis not present

## 2018-11-26 DIAGNOSIS — E119 Type 2 diabetes mellitus without complications: Secondary | ICD-10-CM | POA: Diagnosis not present

## 2018-11-26 DIAGNOSIS — H5201 Hypermetropia, right eye: Secondary | ICD-10-CM | POA: Diagnosis not present

## 2018-11-26 DIAGNOSIS — H52223 Regular astigmatism, bilateral: Secondary | ICD-10-CM | POA: Diagnosis not present

## 2018-12-15 DIAGNOSIS — E039 Hypothyroidism, unspecified: Secondary | ICD-10-CM | POA: Diagnosis not present

## 2018-12-15 DIAGNOSIS — I10 Essential (primary) hypertension: Secondary | ICD-10-CM | POA: Diagnosis not present

## 2018-12-15 DIAGNOSIS — R6882 Decreased libido: Secondary | ICD-10-CM | POA: Diagnosis not present

## 2018-12-15 DIAGNOSIS — R5383 Other fatigue: Secondary | ICD-10-CM | POA: Diagnosis not present

## 2019-01-04 DIAGNOSIS — K117 Disturbances of salivary secretion: Secondary | ICD-10-CM | POA: Diagnosis not present

## 2019-01-27 ENCOUNTER — Ambulatory Visit (INDEPENDENT_AMBULATORY_CARE_PROVIDER_SITE_OTHER): Payer: Medicare Other | Admitting: Internal Medicine

## 2019-01-27 DIAGNOSIS — I10 Essential (primary) hypertension: Secondary | ICD-10-CM | POA: Diagnosis not present

## 2019-01-27 DIAGNOSIS — E119 Type 2 diabetes mellitus without complications: Secondary | ICD-10-CM | POA: Diagnosis not present

## 2019-01-27 DIAGNOSIS — E559 Vitamin D deficiency, unspecified: Secondary | ICD-10-CM | POA: Diagnosis not present

## 2019-01-27 DIAGNOSIS — E039 Hypothyroidism, unspecified: Secondary | ICD-10-CM | POA: Diagnosis not present

## 2019-02-04 DIAGNOSIS — R6882 Decreased libido: Secondary | ICD-10-CM | POA: Diagnosis not present

## 2019-02-04 DIAGNOSIS — E039 Hypothyroidism, unspecified: Secondary | ICD-10-CM | POA: Diagnosis not present

## 2019-02-04 DIAGNOSIS — R5383 Other fatigue: Secondary | ICD-10-CM | POA: Diagnosis not present

## 2019-02-04 DIAGNOSIS — I10 Essential (primary) hypertension: Secondary | ICD-10-CM | POA: Diagnosis not present

## 2019-02-04 DIAGNOSIS — E119 Type 2 diabetes mellitus without complications: Secondary | ICD-10-CM | POA: Diagnosis not present

## 2019-02-04 DIAGNOSIS — E559 Vitamin D deficiency, unspecified: Secondary | ICD-10-CM | POA: Diagnosis not present

## 2019-02-08 DIAGNOSIS — E119 Type 2 diabetes mellitus without complications: Secondary | ICD-10-CM | POA: Diagnosis not present

## 2019-02-08 DIAGNOSIS — E039 Hypothyroidism, unspecified: Secondary | ICD-10-CM | POA: Diagnosis not present

## 2019-02-23 DIAGNOSIS — R5383 Other fatigue: Secondary | ICD-10-CM | POA: Diagnosis not present

## 2019-02-23 DIAGNOSIS — R42 Dizziness and giddiness: Secondary | ICD-10-CM | POA: Diagnosis not present

## 2019-03-03 DIAGNOSIS — I10 Essential (primary) hypertension: Secondary | ICD-10-CM | POA: Diagnosis not present

## 2019-03-03 DIAGNOSIS — E039 Hypothyroidism, unspecified: Secondary | ICD-10-CM | POA: Diagnosis not present

## 2019-03-03 DIAGNOSIS — R42 Dizziness and giddiness: Secondary | ICD-10-CM | POA: Diagnosis not present

## 2019-03-03 DIAGNOSIS — E119 Type 2 diabetes mellitus without complications: Secondary | ICD-10-CM | POA: Diagnosis not present

## 2019-03-03 DIAGNOSIS — R5383 Other fatigue: Secondary | ICD-10-CM | POA: Diagnosis not present

## 2019-03-11 DIAGNOSIS — R197 Diarrhea, unspecified: Secondary | ICD-10-CM | POA: Diagnosis not present

## 2019-03-11 DIAGNOSIS — R42 Dizziness and giddiness: Secondary | ICD-10-CM | POA: Diagnosis not present

## 2019-03-22 ENCOUNTER — Telehealth: Payer: Self-pay | Admitting: *Deleted

## 2019-03-22 DIAGNOSIS — Z20822 Contact with and (suspected) exposure to covid-19: Secondary | ICD-10-CM

## 2019-03-22 DIAGNOSIS — R197 Diarrhea, unspecified: Secondary | ICD-10-CM | POA: Diagnosis not present

## 2019-03-22 NOTE — Telephone Encounter (Signed)
Pt scheduled for Covid testing at [facility] site tomorrow.   Requested by Dr. Hurshel Party.  Pt with diarrhea, unexplained.  Testing process reviewed with pt; stay in car, wear mask. Pt verbalizes understanding.   Pts CB# [phone] Practices # [phone]

## 2019-03-23 ENCOUNTER — Other Ambulatory Visit: Payer: Medicare Other

## 2019-03-23 DIAGNOSIS — Z20822 Contact with and (suspected) exposure to covid-19: Secondary | ICD-10-CM

## 2019-03-23 DIAGNOSIS — R6889 Other general symptoms and signs: Secondary | ICD-10-CM | POA: Diagnosis not present

## 2019-03-23 DIAGNOSIS — R197 Diarrhea, unspecified: Secondary | ICD-10-CM | POA: Diagnosis not present

## 2019-03-24 DIAGNOSIS — R197 Diarrhea, unspecified: Secondary | ICD-10-CM | POA: Diagnosis not present

## 2019-03-27 LAB — NOVEL CORONAVIRUS, NAA: SARS-CoV-2, NAA: NOT DETECTED

## 2019-05-11 DIAGNOSIS — E039 Hypothyroidism, unspecified: Secondary | ICD-10-CM | POA: Diagnosis not present

## 2019-05-11 DIAGNOSIS — E119 Type 2 diabetes mellitus without complications: Secondary | ICD-10-CM | POA: Diagnosis not present

## 2019-05-11 DIAGNOSIS — I10 Essential (primary) hypertension: Secondary | ICD-10-CM | POA: Diagnosis not present

## 2019-06-18 DIAGNOSIS — M25562 Pain in left knee: Secondary | ICD-10-CM | POA: Diagnosis not present

## 2019-06-21 ENCOUNTER — Other Ambulatory Visit: Payer: Self-pay | Admitting: Orthopedic Surgery

## 2019-06-21 DIAGNOSIS — M25561 Pain in right knee: Secondary | ICD-10-CM

## 2019-06-24 ENCOUNTER — Other Ambulatory Visit: Payer: Self-pay

## 2019-06-24 ENCOUNTER — Ambulatory Visit (HOSPITAL_COMMUNITY)
Admission: RE | Admit: 2019-06-24 | Discharge: 2019-06-24 | Disposition: A | Discharge status: Home / Self Care | Payer: Medicare Other | Source: Ambulatory Visit | Attending: Orthopedic Surgery | Admitting: Orthopedic Surgery

## 2019-06-24 DIAGNOSIS — M25561 Pain in right knee: Secondary | ICD-10-CM | POA: Diagnosis not present

## 2019-06-25 DIAGNOSIS — M25562 Pain in left knee: Secondary | ICD-10-CM | POA: Diagnosis not present

## 2019-06-29 ENCOUNTER — Ambulatory Visit (INDEPENDENT_AMBULATORY_CARE_PROVIDER_SITE_OTHER): Payer: Medicare Other | Admitting: Internal Medicine

## 2019-06-29 ENCOUNTER — Other Ambulatory Visit: Payer: Self-pay

## 2019-06-29 ENCOUNTER — Encounter (INDEPENDENT_AMBULATORY_CARE_PROVIDER_SITE_OTHER): Payer: Self-pay | Admitting: Internal Medicine

## 2019-06-29 VITALS — BP 130/60 | HR 72 | Ht 66.0 in | Wt 138.6 lb

## 2019-06-29 DIAGNOSIS — I10 Essential (primary) hypertension: Secondary | ICD-10-CM | POA: Diagnosis not present

## 2019-06-29 DIAGNOSIS — E039 Hypothyroidism, unspecified: Secondary | ICD-10-CM | POA: Diagnosis not present

## 2019-06-29 DIAGNOSIS — E559 Vitamin D deficiency, unspecified: Secondary | ICD-10-CM

## 2019-06-29 DIAGNOSIS — F5101 Primary insomnia: Secondary | ICD-10-CM

## 2019-06-29 DIAGNOSIS — E782 Mixed hyperlipidemia: Secondary | ICD-10-CM

## 2019-06-29 DIAGNOSIS — E119 Type 2 diabetes mellitus without complications: Secondary | ICD-10-CM

## 2019-06-29 NOTE — Patient Instructions (Signed)
[email or web address]  Melatonin 1mg  capsules (#60 in a bottle).

## 2019-06-29 NOTE — Addendum Note (Signed)
Addended by: Doree Albee on: 06/29/2019 12:31 PM   Modules accepted: Orders

## 2019-06-29 NOTE — Progress Notes (Signed)
Wellness Office Visit  Subjective:  Patient ID: [name], female    DOB: Sep 15, 1951  Age: 68 y.o. MRN: [id number]  CC: This lady comes in for follow-up of her diabetes, hypertension, hyperlipidemia, hypothyroidism and vitamin D deficiency. HPI  She has done very well and continues to be stable with her weight. She could not tolerate progesterone whatsoever and has discontinued this.  In the past, she could also not tolerate testosterone. She does continue with estradiol. She also continues with NP thyroid for hypothyroidism. She is due to have right knee surgery for torn cartilage soon in [place]. As far as her diabetes is concerned, she is off all insulin and off all medications.  Her last hemoglobin A1c was 5.5% in May of this year.  She is technically not a diabetic but she would like to still keep a check on the hemoglobin A1c. She continues to take vitamin D3 supplementation for vitamin D deficiency. For her hypertension, she continues on both antihypertensive medications.  She denies any chest pain, dyspnea or palpitations.  There is no  limb weakness. Past Medical History:  Diagnosis Date  . Arthritis   . Asthma    takes Singulair nightly  . Diabetes mellitus    takes levemir daily and Metformin daily  . Generalized headaches   . GERD (gastroesophageal reflux disease)    takes Omeprazole daily  . Herniated disc, cervical   . History of kidney stones   . Hyperlipidemia    takes Welchol daily  . Hypertension    takes Enalapril dialy  . Hypothyroidism    takes Synthroid daily  . IBS (irritable bowel syndrome)   . Joint pain    hands and neck  . Nocturia   . PONV (postoperative nausea and vomiting)   . Seizures ([place])    denies  . TIA (transient ischemic attack)    diagnosed by EEG but never knew she had it  . Trigger finger   . Weakness   . Weakness    in both hands      Family History  Problem Relation Age of Onset  . Cancer Mother    pancreatic    Social History   Social History Narrative   Married for 71 years,second.Lives with husband.Retired.     Current Meds  Medication Sig  . amLODipine (NORVASC) 5 MG tablet Take 1 tablet (5 mg total) by mouth daily.  Marland Kitchen estradiol (ESTRACE) 1 MG tablet Take 1 tablet by mouth daily.  . metoprolol tartrate (LOPRESSOR) 50 MG tablet TAKE 1.5 TABLETS (75 MG TOTAL) BY MOUTH 2 (TWO) TIMES DAILY. (Patient taking differently: Take 50 mg by mouth daily. )  . NP THYROID 90 MG tablet Take 1 tablet by mouth daily.  . pantoprazole (PROTONIX) 40 MG tablet Take 40 mg by mouth 2 (two) times daily.      Nutrition  She continues to do intermittent fasting and eats healthy. Sleep  Her sleep is a challenge often.  She has an interrupted sleep  Exercise  Currently no regular exercise. Bio Identical Hormones  Estradiol is being used in this patient for multiple benefits based on several studies including protection against heart disease, cerebrovascular disease, osteoporosis, colon cancer, Alzheimer's disease, macular degeneration and cataracts. The patient has been counseled regarding benefits and side effects and modes of administration. The patient is agreeable that this therapy is an integral to part of her wellness, quality of life and prevention of chronic disease.  Objective:   Today's Vitals:  BP 130/60   Pulse 72   Wt 138 lb 9.6 oz (62.9 kg)   BMI 22.37 kg/m  Vitals with BMI 06/29/2019 09/02/2018 07/01/2018  Height - 5\' 6"  5\' 6"   Weight 138 lbs 10 oz 155 lbs 148 lbs  BMI - [id number]  Systolic [id number]  Diastolic 60 63 72  Pulse 72 76 76     Physical Exam   She looks systemically well.  Blood pressure is well controlled.  Her weight is stable from what it was before within a couple of pounds range.    Assessment   1. Primary hypothyroidism   2. Benign hypertension   3. Mixed hyperlipidemia   4. Diabetes mellitus without complication ([place])   5. Vitamin D  deficiency disease   6. Primary insomnia       Tests ordered Orders Placed This Encounter  Procedures  . COMPLETE METABOLIC PANEL WITH GFR  . Hemoglobin A1c  . T3, free  . TSH  . VITAMIN D 25 Hydroxy (Vit-D Deficiency, Fractures)     Plan: 1. She will continue with all medications for chronic conditions above. 2. As far as the insomnia is concerned, I recommended melatonin to see if this will help her. 3. Further recommendations will depend on blood results and I will see her in about 3 to 4 months for follow-up.  We need to discuss more about exercise in particular strength training for her.     Doree Albee, MD

## 2019-06-30 LAB — COMPLETE METABOLIC PANEL WITH GFR
AG Ratio: 2 (calc) (ref 1.0–2.5)
ALT: 18 U/L (ref 6–29)
AST: 16 U/L (ref 10–35)
Albumin: 4.3 g/dL (ref 3.6–5.1)
Alkaline phosphatase (APISO): 52 U/L (ref 37–153)
BUN: 16 mg/dL (ref 7–25)
CO2: 24 mmol/L (ref 20–32)
Calcium: 9.2 mg/dL (ref 8.6–10.4)
Chloride: 107 mmol/L (ref 98–110)
Creat: 0.81 mg/dL (ref 0.50–0.99)
GFR, Est African American: 86 mL/min/{1.73_m2} (ref >=60)
GFR, Est Non African American: 74 mL/min/{1.73_m2} (ref >=60)
Globulin: 2.1 g/dL (calc) (ref 1.9–3.7)
Glucose, Bld: 114 mg/dL — ABNORMAL HIGH (ref 65–99)
Potassium: 4.7 mmol/L (ref 3.5–5.3)
Sodium: 139 mmol/L (ref 135–146)
Total Bilirubin: 0.6 mg/dL (ref 0.2–1.2)
Total Protein: 6.4 g/dL (ref 6.1–8.1)

## 2019-06-30 LAB — VITAMIN D 25 HYDROXY (VIT D DEFICIENCY, FRACTURES): Vit D, 25-Hydroxy: 18 ng/mL — ABNORMAL LOW (ref 30–100)

## 2019-06-30 LAB — HEMOGLOBIN A1C
Hgb A1c MFr Bld: 5.8 % of total Hgb — ABNORMAL HIGH (ref <5.7)
Mean Plasma Glucose: 120 (calc)
eAG (mmol/L): 6.6 (calc)

## 2019-06-30 LAB — T3, FREE: T3, Free: 4.7 pg/mL — ABNORMAL HIGH (ref 2.3–4.2)

## 2019-06-30 LAB — TSH: TSH: 0.15 mIU/L — ABNORMAL LOW (ref 0.40–4.50)

## 2019-06-30 NOTE — Progress Notes (Signed)
Your hemoglobin A1c is creeping up a little bit to 5.8%.  You are still not diabetic but you are considered prediabetic so if you can just tighten up on the nutrition that we discussed, that would be helpful.  You definitely do not need any medication still for the prediabetes. Your thyroid is in excellent range so continue with the same dose of thyroid. Your vitamin D levels are very low so make sure you are taking vitamin D3 10,000 units daily. If you have any questions, do not hesitate to contact me through my chart, otherwise I will see you next time.  Be well!

## 2019-07-01 ENCOUNTER — Telehealth (INDEPENDENT_AMBULATORY_CARE_PROVIDER_SITE_OTHER): Payer: Self-pay | Admitting: Internal Medicine

## 2019-07-05 NOTE — Telephone Encounter (Signed)
Done

## 2019-07-12 ENCOUNTER — Other Ambulatory Visit (INDEPENDENT_AMBULATORY_CARE_PROVIDER_SITE_OTHER): Payer: Self-pay | Admitting: Internal Medicine

## 2019-07-12 ENCOUNTER — Telehealth (INDEPENDENT_AMBULATORY_CARE_PROVIDER_SITE_OTHER): Payer: Self-pay

## 2019-07-13 ENCOUNTER — Other Ambulatory Visit (INDEPENDENT_AMBULATORY_CARE_PROVIDER_SITE_OTHER): Payer: Self-pay | Admitting: Internal Medicine

## 2019-07-13 MED ORDER — ESTRADIOL 1 MG PO TABS: 1.0000 mg | ORAL_TABLET | Freq: Every day | ORAL | 0 refills | Status: DC

## 2019-07-13 NOTE — Telephone Encounter (Signed)
Pt said she was changing mind about stopping a medication at this time. Since husband is not doing well; her blood pressure medication she will stay on until all this pass.

## 2019-08-04 DIAGNOSIS — Y999 Unspecified external cause status: Secondary | ICD-10-CM | POA: Diagnosis not present

## 2019-08-04 DIAGNOSIS — S83271A Complex tear of lateral meniscus, current injury, right knee, initial encounter: Secondary | ICD-10-CM | POA: Diagnosis not present

## 2019-08-04 DIAGNOSIS — S83281A Other tear of lateral meniscus, current injury, right knee, initial encounter: Secondary | ICD-10-CM | POA: Diagnosis not present

## 2019-08-04 DIAGNOSIS — X58XXXA Exposure to other specified factors, initial encounter: Secondary | ICD-10-CM | POA: Diagnosis not present

## 2019-08-04 DIAGNOSIS — M1711 Unilateral primary osteoarthritis, right knee: Secondary | ICD-10-CM | POA: Diagnosis not present

## 2019-08-09 DIAGNOSIS — R269 Unspecified abnormalities of gait and mobility: Secondary | ICD-10-CM | POA: Diagnosis not present

## 2019-08-09 DIAGNOSIS — M25561 Pain in right knee: Secondary | ICD-10-CM | POA: Diagnosis not present

## 2019-08-09 DIAGNOSIS — M25661 Stiffness of right knee, not elsewhere classified: Secondary | ICD-10-CM | POA: Diagnosis not present

## 2019-08-13 DIAGNOSIS — S83281D Other tear of lateral meniscus, current injury, right knee, subsequent encounter: Secondary | ICD-10-CM | POA: Diagnosis not present

## 2019-08-13 DIAGNOSIS — M1711 Unilateral primary osteoarthritis, right knee: Secondary | ICD-10-CM | POA: Diagnosis not present

## 2019-08-13 DIAGNOSIS — M25561 Pain in right knee: Secondary | ICD-10-CM | POA: Diagnosis not present

## 2019-08-13 DIAGNOSIS — R269 Unspecified abnormalities of gait and mobility: Secondary | ICD-10-CM | POA: Diagnosis not present

## 2019-08-13 DIAGNOSIS — M25661 Stiffness of right knee, not elsewhere classified: Secondary | ICD-10-CM | POA: Diagnosis not present

## 2019-08-16 DIAGNOSIS — M25561 Pain in right knee: Secondary | ICD-10-CM | POA: Diagnosis not present

## 2019-08-16 DIAGNOSIS — M25661 Stiffness of right knee, not elsewhere classified: Secondary | ICD-10-CM | POA: Diagnosis not present

## 2019-08-16 DIAGNOSIS — R269 Unspecified abnormalities of gait and mobility: Secondary | ICD-10-CM | POA: Diagnosis not present

## 2019-08-17 ENCOUNTER — Other Ambulatory Visit: Payer: Self-pay

## 2019-08-17 ENCOUNTER — Ambulatory Visit (INDEPENDENT_AMBULATORY_CARE_PROVIDER_SITE_OTHER): Payer: Medicare Other

## 2019-08-17 DIAGNOSIS — Z23 Encounter for immunization: Secondary | ICD-10-CM

## 2019-08-30 ENCOUNTER — Other Ambulatory Visit (INDEPENDENT_AMBULATORY_CARE_PROVIDER_SITE_OTHER): Payer: Self-pay | Admitting: Internal Medicine

## 2019-08-30 ENCOUNTER — Telehealth (INDEPENDENT_AMBULATORY_CARE_PROVIDER_SITE_OTHER): Payer: Self-pay | Admitting: Internal Medicine

## 2019-08-30 MED ORDER — ALPRAZOLAM 0.5 MG PO TABS: 0.5000 mg | ORAL_TABLET | Freq: Two times a day (BID) | ORAL | 0 refills | Status: DC | PRN

## 2019-08-30 NOTE — Telephone Encounter (Signed)
Please let patient know that I have sent Xanax to [facility], to be used only if necessary.  Also, please arrange a telemedicine visit later on this week or early next week to follow-up on this.

## 2019-09-09 ENCOUNTER — Telehealth (INDEPENDENT_AMBULATORY_CARE_PROVIDER_SITE_OTHER): Payer: Medicare Other | Admitting: Internal Medicine

## 2019-09-20 ENCOUNTER — Other Ambulatory Visit (INDEPENDENT_AMBULATORY_CARE_PROVIDER_SITE_OTHER): Payer: Self-pay | Admitting: Internal Medicine

## 2019-10-07 ENCOUNTER — Other Ambulatory Visit (INDEPENDENT_AMBULATORY_CARE_PROVIDER_SITE_OTHER): Payer: Self-pay | Admitting: Internal Medicine

## 2019-10-14 ENCOUNTER — Encounter (INDEPENDENT_AMBULATORY_CARE_PROVIDER_SITE_OTHER): Payer: Self-pay | Admitting: Internal Medicine

## 2019-10-14 ENCOUNTER — Ambulatory Visit (INDEPENDENT_AMBULATORY_CARE_PROVIDER_SITE_OTHER): Payer: Medicare Other | Admitting: Internal Medicine

## 2019-10-14 ENCOUNTER — Other Ambulatory Visit: Payer: Self-pay

## 2019-10-14 VITALS — BP 120/60 | HR 77 | Ht 66.0 in | Wt 140.8 lb

## 2019-10-14 DIAGNOSIS — E782 Mixed hyperlipidemia: Secondary | ICD-10-CM

## 2019-10-14 DIAGNOSIS — I10 Essential (primary) hypertension: Secondary | ICD-10-CM

## 2019-10-14 DIAGNOSIS — F5101 Primary insomnia: Secondary | ICD-10-CM

## 2019-10-14 DIAGNOSIS — E119 Type 2 diabetes mellitus without complications: Secondary | ICD-10-CM | POA: Diagnosis not present

## 2019-10-14 DIAGNOSIS — E039 Hypothyroidism, unspecified: Secondary | ICD-10-CM

## 2019-10-14 DIAGNOSIS — E2839 Other primary ovarian failure: Secondary | ICD-10-CM

## 2019-10-14 DIAGNOSIS — E559 Vitamin D deficiency, unspecified: Secondary | ICD-10-CM | POA: Diagnosis not present

## 2019-10-14 NOTE — Progress Notes (Signed)
Metrics: Intervention Frequency ACO  Documented Smoking Status Yearly  Screened one or more times in 24 months  Cessation Counseling or  Active cessation medication Past 24 months  Past 24 months   Guideline developer: UpToDate (See UpToDate for funding source) Date Released: 2014       Wellness Office Visit  Subjective:  Patient ID: [name], female    DOB: [birth year]  Age: 70 y.o. MRN: [id number]  CC: This delightful lady comes in for follow-up of diabetes, hypothyroidism, hypertension, hyperlipidemia and postmenopausal state. HPI She is doing reasonably well and in this pandemic and with the loss of her husband in 2023-07-27 of last year, I think she has done very well to continue to focus on her health and including nutrition.  Her last hemoglobin A1c was 5.8% and she is no longer on any medications for diabetes. She continues on desiccated NP thyroid for hypothyroidism and is tolerating it quite well. She continues on antihypertensive therapy.  She wonders whether this could be reduced also further.  Past Medical History:  Diagnosis Date  . Arthritis   . Asthma    takes Singulair nightly  . Diabetes mellitus    takes levemir daily and Metformin daily  . Generalized headaches   . GERD (gastroesophageal reflux disease)    takes Omeprazole daily  . Herniated disc, cervical   . History of kidney stones   . Hyperlipidemia    takes Welchol daily  . Hypertension    takes Enalapril dialy  . Hypothyroidism    takes Synthroid daily  . IBS (irritable bowel syndrome)   . Joint pain    hands and neck  . Nocturia   . PONV (postoperative nausea and vomiting)   . Seizures ([place])    denies  . TIA (transient ischemic attack)    diagnosed by EEG but never knew she had it  . Trigger finger   . Weakness   . Weakness    in both hands      Family History  Problem Relation Age of Onset  . Cancer Mother        pancreatic    Social History   Social History Narrative   Widow,husband died 07/27/19 after 90 years marriage.Retired.   Social History   Tobacco Use  . Smoking status: Former Smoker    Packs/day: 1.00    Years: 40.00    Pack years: 40.00    Quit date: 07/19/2012    Years since quitting: 7.2  . Smokeless tobacco: Never Used  . Tobacco comment: quit smoking in 07-26-2012  Substance Use Topics  . Alcohol use: No    Alcohol/week: 0.0 standard drinks    Current Meds  Medication Sig  . ALPRAZolam (XANAX) 0.5 MG tablet Take 1 tablet (0.5 mg total) by mouth 2 (two) times daily as needed for anxiety.  Marland Kitchen amLODipine (NORVASC) 5 MG tablet Take 1 tablet (5 mg total) by mouth daily.  Marland Kitchen estradiol (ESTRACE) 1 MG tablet TAKE ONE TABLET BY MOUTH ONCE DAILY.  . metoprolol tartrate (LOPRESSOR) 50 MG tablet Take 1 tablet (50 mg total) by mouth daily.  . NP THYROID 90 MG tablet TAKE 1 TABLET BY MOUTH ONCE DAILY    **NEW DOSE**   Micronized progesterone is being used in this patient for multiple benefits based on studies including protection against uterine cancer, breast cancer, osteoporosis and heart disease. The patient has been counseled regarding side effects, benefits and modes of administration. The patient is agreeable  that this therapy is an integral part of her wellness, quality of life and prevention of chronic disease.  Estradiol is being used in this patient for multiple benefits based on several studies including protection against heart disease, cerebrovascular disease, osteoporosis, colon cancer, Alzheimer's disease, macular degeneration and cataracts. The patient has been counseled regarding benefits and side effects and modes of administration. The patient is agreeable that this therapy is an integral to part of her wellness, quality of life and prevention of chronic disease.   Objective:   Today's Vitals: BP 120/60   Pulse 77   Ht _0  (1.676 m)   Wt 140 lb 12.8 oz (63.9 kg)   SpO2 99%   BMI 22.73 kg/m  Vitals with BMI 10/14/2019  06/29/2019 09/02/2018  Height _1  _2  _3   Weight 140 lbs 13 oz 138 lbs 10 oz 155 lbs  BMI 22.74 44.03 47.42  Systolic 595 638 756  Diastolic 60 60 63  Pulse 77 72 76     Physical Exam  She looks systemically well.  Her blood pressure is extremely well controlled.  She is alert and orientated without any focal neurological signs.     Assessment   1. Diabetes mellitus without complication ([place])   2. Benign hypertension   3. Primary hypothyroidism   4. Vitamin D deficiency disease   5. Mixed hyperlipidemia   6. Primary insomnia   7. Primary ovarian failure       Tests ordered Orders Placed This Encounter  Procedures  . CMP with eGFR([facility])  . Hemoglobin A1c  . T3, Free  . TSH  . Estradiol  . Progesterone  . Lipid Panel     Plan: 1. Blood work is ordered as above. 2. She will continue with desiccated NP thyroid for hypothyroidism and we will see if we need to alter the dose although I doubt so. 3. She will continue with antihypertensive therapy and we will need to decide whether we can reduce the dose further. 4. I will also check estradiol and progesterone levels and she will continue with bioidentical hormone therapy. 5. Further recommendations will depend on blood results and I will see her in about 3 months time for follow-up. 6. Today we also discussed COVID-19 vaccination and I spent some time doing this.   No orders of the defined types were placed in this encounter.   Doree Albee, MD  TIME SPENT: 35 minutes.

## 2019-10-15 LAB — HEMOGLOBIN A1C
Hgb A1c MFr Bld: 5.5 % of total Hgb (ref <5.7)
Mean Plasma Glucose: 111 (calc)
eAG (mmol/L): 6.2 (calc)

## 2019-10-15 LAB — COMPLETE METABOLIC PANEL WITH GFR
AG Ratio: 2.5 (calc) (ref 1.0–2.5)
ALT: 17 U/L (ref 6–29)
AST: 15 U/L (ref 10–35)
Albumin: 4.7 g/dL (ref 3.6–5.1)
Alkaline phosphatase (APISO): 60 U/L (ref 37–153)
BUN: 21 mg/dL (ref 7–25)
CO2: 23 mmol/L (ref 20–32)
Calcium: 8.8 mg/dL (ref 8.6–10.4)
Chloride: 108 mmol/L (ref 98–110)
Creat: 0.81 mg/dL (ref 0.60–0.93)
GFR, Est African American: 85 mL/min/{1.73_m2} (ref >=60)
GFR, Est Non African American: 74 mL/min/{1.73_m2} (ref >=60)
Globulin: 1.9 g/dL (calc) (ref 1.9–3.7)
Glucose, Bld: 97 mg/dL (ref 65–99)
Potassium: 4.8 mmol/L (ref 3.5–5.3)
Sodium: 141 mmol/L (ref 135–146)
Total Bilirubin: 0.4 mg/dL (ref 0.2–1.2)
Total Protein: 6.6 g/dL (ref 6.1–8.1)

## 2019-10-15 LAB — LIPID PANEL
Cholesterol: 176 mg/dL (ref <200)
HDL: 35 mg/dL — ABNORMAL LOW (ref >=50)
LDL Cholesterol (Calc): 102 mg/dL (calc) — ABNORMAL HIGH
Non-HDL Cholesterol (Calc): 141 mg/dL (calc) — ABNORMAL HIGH (ref <130)
Total CHOL/HDL Ratio: 5 (calc) — ABNORMAL HIGH (ref <5.0)
Triglycerides: 273 mg/dL — ABNORMAL HIGH (ref <150)

## 2019-10-15 LAB — ESTRADIOL: Estradiol: 29 pg/mL

## 2019-10-15 LAB — T3, FREE: T3, Free: 3.4 pg/mL (ref 2.3–4.2)

## 2019-10-15 LAB — TSH: TSH: 0.57 mIU/L (ref 0.40–4.50)

## 2019-10-15 LAB — PROGESTERONE: Progesterone: <0.5 ng/mL

## 2019-10-26 ENCOUNTER — Other Ambulatory Visit (INDEPENDENT_AMBULATORY_CARE_PROVIDER_SITE_OTHER): Payer: Self-pay | Admitting: Internal Medicine

## 2019-11-24 DIAGNOSIS — Z23 Encounter for immunization: Secondary | ICD-10-CM | POA: Diagnosis not present

## 2019-12-20 ENCOUNTER — Telehealth (INDEPENDENT_AMBULATORY_CARE_PROVIDER_SITE_OTHER): Payer: Self-pay

## 2019-12-20 DIAGNOSIS — E039 Hypothyroidism, unspecified: Secondary | ICD-10-CM

## 2019-12-20 MED ORDER — NP THYROID 90 MG PO TABS: ORAL_TABLET | ORAL | 3 refills | Status: DC

## 2019-12-20 NOTE — Telephone Encounter (Signed)
LeighAnn Tiffaney Heimann, CMA  

## 2019-12-22 ENCOUNTER — Other Ambulatory Visit (INDEPENDENT_AMBULATORY_CARE_PROVIDER_SITE_OTHER): Payer: Self-pay

## 2019-12-22 DIAGNOSIS — Z23 Encounter for immunization: Secondary | ICD-10-CM | POA: Diagnosis not present

## 2019-12-22 DIAGNOSIS — E039 Hypothyroidism, unspecified: Secondary | ICD-10-CM

## 2019-12-22 MED ORDER — NP THYROID 90 MG PO TABS: ORAL_TABLET | ORAL | 0 refills | Status: DC

## 2019-12-30 ENCOUNTER — Other Ambulatory Visit (INDEPENDENT_AMBULATORY_CARE_PROVIDER_SITE_OTHER): Payer: Self-pay | Admitting: Internal Medicine

## 2020-01-04 ENCOUNTER — Other Ambulatory Visit (INDEPENDENT_AMBULATORY_CARE_PROVIDER_SITE_OTHER): Payer: Self-pay

## 2020-01-04 DIAGNOSIS — E039 Hypothyroidism, unspecified: Secondary | ICD-10-CM

## 2020-01-04 MED ORDER — NP THYROID 90 MG PO TABS: ORAL_TABLET | ORAL | 0 refills | Status: DC

## 2020-01-17 ENCOUNTER — Ambulatory Visit (INDEPENDENT_AMBULATORY_CARE_PROVIDER_SITE_OTHER): Payer: Medicare Other | Admitting: Internal Medicine

## 2020-01-17 ENCOUNTER — Other Ambulatory Visit: Payer: Self-pay

## 2020-01-17 ENCOUNTER — Encounter (INDEPENDENT_AMBULATORY_CARE_PROVIDER_SITE_OTHER): Payer: Self-pay | Admitting: Internal Medicine

## 2020-01-17 VITALS — BP 140/60 | HR 75 | Temp 97.2°F | Ht 66.0 in | Wt 145.6 lb

## 2020-01-17 DIAGNOSIS — E2839 Other primary ovarian failure: Secondary | ICD-10-CM

## 2020-01-17 DIAGNOSIS — E782 Mixed hyperlipidemia: Secondary | ICD-10-CM | POA: Diagnosis not present

## 2020-01-17 DIAGNOSIS — E119 Type 2 diabetes mellitus without complications: Secondary | ICD-10-CM | POA: Diagnosis not present

## 2020-01-17 DIAGNOSIS — I10 Essential (primary) hypertension: Secondary | ICD-10-CM | POA: Diagnosis not present

## 2020-01-17 DIAGNOSIS — E559 Vitamin D deficiency, unspecified: Secondary | ICD-10-CM | POA: Diagnosis not present

## 2020-01-17 DIAGNOSIS — E039 Hypothyroidism, unspecified: Secondary | ICD-10-CM

## 2020-01-17 LAB — COMPLETE METABOLIC PANEL WITH GFR
GFR, Est Non African American: 81 mL/min/{1.73_m2} (ref >=60)
Glucose, Bld: 129 mg/dL — ABNORMAL HIGH (ref 65–99)

## 2020-01-17 LAB — LIPID PANEL: Non-HDL Cholesterol (Calc): 140 mg/dL (calc) — ABNORMAL HIGH (ref <130)

## 2020-01-17 NOTE — Progress Notes (Signed)
Metrics: Intervention Frequency ACO  Documented Smoking Status Yearly  Screened one or more times in 24 months  Cessation Counseling or  Active cessation medication Past 24 months  Past 24 months   Guideline developer: UpToDate (See UpToDate for funding source) Date Released: 2014       Wellness Office Visit  Subjective:  Patient ID: [name], female    DOB: 27-Mar-1951  Age: 69 y.o. MRN: [id number]  CC: This lady comes in for follow-up of her diabetes, hypothyroidism, hypertension, hyperlipidemia and menopausal symptoms. HPI  She also has vitamin D deficiency but unfortunately no matter what kind of formulation of vitamin D3 she takes, she is unable to tolerate it. Also, she was on progesterone but she has not been able to tolerate this either and this is why she is only on estradiol.  She has had a hysterectomy.  Restart levels were suboptimal on the last visit. As far as her diabetes is concerned, on her last visit, she was actually nondiabetic with a hemoglobin A1c below 5.7%. She also has hypothyroidism and continues with desiccated NP thyroid and she is tolerating this well. She feels that she has gained some weight lately.  She lost her husband in 08-08-2019 and she is still somewhat grieving but it is not abnormal. Past Medical History:  Diagnosis Date  . Arthritis   . Asthma    takes Singulair nightly  . Diabetes mellitus    takes levemir daily and Metformin daily  . Generalized headaches   . GERD (gastroesophageal reflux disease)    takes Omeprazole daily  . Herniated disc, cervical   . History of kidney stones   . Hyperlipidemia    takes Welchol daily  . Hypertension    takes Enalapril dialy  . Hypothyroidism    takes Synthroid daily  . IBS (irritable bowel syndrome)   . Joint pain    hands and neck  . Nocturia   . PONV (postoperative nausea and vomiting)   . Seizures ([place])    denies  . TIA (transient ischemic attack)    diagnosed by EEG but never  knew she had it  . Trigger finger   . Weakness   . Weakness    in both hands      Family History  Problem Relation Age of Onset  . Cancer Mother        pancreatic    Social History   Social History Narrative   Widow,husband died Aug 08, 2019 after 58 years marriage.Retired.   Social History   Tobacco Use  . Smoking status: Former Smoker    Packs/day: 1.00    Years: 40.00    Pack years: 40.00    Quit date: 07/19/2012    Years since quitting: 7.5  . Smokeless tobacco: Never Used  . Tobacco comment: quit smoking in 08-07-2012  Substance Use Topics  . Alcohol use: No    Alcohol/week: 0.0 standard drinks    Current Meds  Medication Sig  . amLODipine (NORVASC) 5 MG tablet TAKE 1 TABLET BY MOUTH DAILY  . estradiol (ESTRACE) 1 MG tablet TAKE ONE TABLET BY MOUTH ONCE DAILY.  . metoprolol tartrate (LOPRESSOR) 50 MG tablet TAKE 1 TABLET BY MOUTH TWICE DAILY. (Patient taking differently: Take 50 mg by mouth daily. )  . NP THYROID 90 MG tablet TAKE 1 TABLET BY MOUTH ONCE DAILY    **NEW DOSE**       Objective:   Today's Vitals: BP 140/60 (BP Location: Right  Arm, Patient Position: Sitting, Cuff Size: Normal)   Pulse 75   Temp (!) 97.2 F (36.2 C) (Temporal)   Ht 5\' 6"  (1.676 m)   Wt 145 lb 9.6 oz (66 kg)   SpO2 98%   BMI 23.50 kg/m  Vitals with BMI 01/17/2020 10/14/2019 06/29/2019  Height 5\' 6"  5\' 6"  5\' 6"   Weight 145 lbs 10 oz 140 lbs 13 oz 138 lbs 10 oz  BMI 23.51 [id number]  Systolic [id number]  Diastolic 60 60 60  Pulse 75 77 72     Physical Exam   She looks systemically well.  Blood pressure is reasonable for age.  She has gained about 5 pounds since January.  Alert and orientated without any focal neurological signs.    Assessment   1. Primary hypothyroidism   2. Diabetes mellitus without complication ([place])   3. Benign hypertension   4. Vitamin D deficiency disease   5. Mixed hyperlipidemia   6. Primary ovarian failure       Tests  ordered Orders Placed This Encounter  Procedures  . COMPLETE METABOLIC PANEL WITH GFR  . Estradiol  . Hemoglobin A1c  . T3, free  . Lipid panel     Plan: 1. Blood work is ordered. 2. She will continue with desiccated NP thyroid and we will see what her T3 levels are to see if we need to optimize further. 3. As far as her diabetes is concerned, I will check an A1c but currently she is nondiabetic based on her previous A1c. 4. She will continue with estradiol and we will check levels today. 5. I will check a lipid panel also today. 6. Further recommendations will depend on blood results and I will see her in about 3 months time for annual physical exam.  Spent 40 minutes with this patient today, discussing all her medical conditions and answering all the questions she had including COVID-19 vaccination.   No orders of the defined types were placed in this encounter.   Doree Albee, MD

## 2020-01-17 NOTE — Patient Instructions (Signed)
[facility] Dietary Recommendations for Weight Loss What to Avoid . Avoid added sugars o Often added sugar can be found in processed foods such as many condiments, dry cereals, cakes, cookies, chips, crisps, crackers, candies, sweetened drinks, etc.  o Read labels and AVOID/DECREASE use of foods with the following in their ingredient list: Sugar, fructose, high fructose corn syrup, sucrose, glucose, maltose, dextrose, molasses, cane sugar, brown sugar, any type of syrup, agave nectar, etc.   . Avoid snacking in between meals . Avoid foods made with flour o If you are going to eat food made with flour, choose those made with whole-grains; and, minimize your consumption as much as is tolerable . Avoid processed foods o These foods are generally stocked in the middle of the grocery store. Focus on shopping on the perimeter of the grocery.  . Avoid Meat  o We recommend following a plant-based diet at [facility]. Thus, we recommend avoiding meat as a general rule. Consider eating beans, legumes, eggs, and/or dairy products for regular protein sources o If you plan on eating meat limit to 4 ounces of meat at a time and choose lean options such as Fish, chicken, turkey. Avoid red meat intake such as pork and/or steak What to Include . Vegetables o GREEN LEAFY VEGETABLES: Kale, spinach, mustard greens, collard greens, cabbage, broccoli, etc. o OTHER: Asparagus, cauliflower, eggplant, carrots, peas, Brussel sprouts, tomatoes, bell peppers, zucchini, beets, cucumbers, etc. . Grains, seeds, and legumes o Beans: kidney beans, black eyed peas, garbanzo beans, black beans, pinto beans, etc. o Whole, unrefined grains: brown rice, barley, bulgur, oatmeal, etc. . Healthy fats  o Avoid highly processed fats such as vegetable oil o Examples of healthy fats: avocado, olives, virgin olive oil, dark chocolate (?72% Cocoa), nuts (peanuts, almonds, walnuts, cashews, pecans, etc.) . None to Low  Intake of Animal Sources of Protein o Meat sources: chicken, turkey, salmon, tuna. Limit to 4 ounces of meat at one time. o Consider limiting dairy sources, but when choosing dairy focus on: PLAIN Greek yogurt, cottage cheese, high-protein milk . Fruit o Choose berries  When to Eat . Intermittent Fasting: o Choosing not to eat for a specific time period, but DO FOCUS ON HYDRATION when fasting o Multiple Techniques: - Time Restricted Eating: eat 3 meals in a day, each meal lasting no more than 60 minutes, no snacks between meals - 16-18 hour fast: fast for 16 to 18 hours up to 7 days a week. Often suggested to start with 2-3 nonconsecutive days per week.  . Remember the time you sleep is counted as fasting.  . Examples of eating schedule: Fast from 7:00pm-11:00am. Eat between 11:00am-7:00pm.  - 24-hour fast: fast for 24 hours up to every other day. Often suggested to start with 1 day per week . Remember the time you sleep is counted as fasting . Examples of eating schedule:  o Eating day: eat 2-3 meals on your eating day. If doing 2 meals, each meal should last no more than 90 minutes. If doing 3 meals, each meal should last no more than 60 minutes. Finish last meal by 7:00pm. o Fasting day: Fast until 7:00pm.  o IF YOU FEEL UNWELL FOR ANY REASON/IN ANY WAY WHEN FASTING, STOP FASTING BY EATING A NUTRITIOUS SNACK OR LIGHT MEAL o ALWAYS FOCUS ON HYDRATION DURING FASTS - Acceptable Hydration sources: water, broths, tea/coffee (black tea/coffee is best but using a small amount of whole-fat dairy products in coffee/tea is acceptable).  -   Poor Hydration Sources: anything with sugar or artificial sweeteners added to it  These recommendations have been developed for patients that are actively receiving medical care from either Dr. Jeannene Tschetter or Sarah Gray, DNP, NP-C at [facility]. These recommendations are developed for patients with specific medical conditions and are not meant to be  distributed or used by others that are not actively receiving care from either provider listed above at [facility]. It is not appropriate to participate in the above eating plans without proper medical supervision.   Reference: Fung, J. The obesity code. [place]: [place]; 2016.   

## 2020-01-18 ENCOUNTER — Ambulatory Visit (INDEPENDENT_AMBULATORY_CARE_PROVIDER_SITE_OTHER): Payer: Medicare Other | Admitting: Internal Medicine

## 2020-01-18 LAB — COMPLETE METABOLIC PANEL WITH GFR
AG Ratio: 2.3 (calc) (ref 1.0–2.5)
ALT: 19 U/L (ref 6–29)
AST: 16 U/L (ref 10–35)
Albumin: 4.5 g/dL (ref 3.6–5.1)
Alkaline phosphatase (APISO): 50 U/L (ref 37–153)
BUN: 17 mg/dL (ref 7–25)
CO2: 24 mmol/L (ref 20–32)
Calcium: 9 mg/dL (ref 8.6–10.4)
Chloride: 107 mmol/L (ref 98–110)
Creat: 0.75 mg/dL (ref 0.60–0.93)
GFR, Est African American: 94 mL/min/{1.73_m2} (ref >=60)
Globulin: 2 g/dL (calc) (ref 1.9–3.7)
Potassium: 4.6 mmol/L (ref 3.5–5.3)
Sodium: 139 mmol/L (ref 135–146)
Total Bilirubin: 0.5 mg/dL (ref 0.2–1.2)
Total Protein: 6.5 g/dL (ref 6.1–8.1)

## 2020-01-18 LAB — ESTRADIOL: Estradiol: 70 pg/mL

## 2020-01-18 LAB — HEMOGLOBIN A1C
Hgb A1c MFr Bld: 5.8 %{Hb} — ABNORMAL HIGH (ref <5.7)
Mean Plasma Glucose: 120 (calc)
eAG (mmol/L): 6.6 (calc)

## 2020-01-18 LAB — LIPID PANEL
Cholesterol: 173 mg/dL (ref <200)
HDL: 33 mg/dL — ABNORMAL LOW (ref >=50)
LDL Cholesterol (Calc): 108 mg/dL (calc) — ABNORMAL HIGH
Total CHOL/HDL Ratio: 5.2 (calc) — ABNORMAL HIGH (ref <5.0)
Triglycerides: 205 mg/dL — ABNORMAL HIGH (ref <150)

## 2020-01-18 LAB — T3, FREE: T3, Free: 5.1 pg/mL — ABNORMAL HIGH (ref 2.3–4.2)

## 2020-01-24 NOTE — Progress Notes (Signed)
Patient called. Pt did see the lab results. Working on A1c  getting it back down now.

## 2020-03-05 ENCOUNTER — Other Ambulatory Visit: Payer: Self-pay

## 2020-03-05 ENCOUNTER — Emergency Department (HOSPITAL_COMMUNITY)
Admission: EM | Admit: 2020-03-05 | Discharge: 2020-03-05 | Disposition: A | Discharge status: Home / Self Care | Payer: Medicare Other | Attending: Emergency Medicine | Admitting: Emergency Medicine

## 2020-03-05 ENCOUNTER — Encounter (HOSPITAL_COMMUNITY): Payer: Self-pay

## 2020-03-05 ENCOUNTER — Emergency Department (HOSPITAL_COMMUNITY): Payer: Medicare Other

## 2020-03-05 DIAGNOSIS — Z20822 Contact with and (suspected) exposure to covid-19: Secondary | ICD-10-CM | POA: Diagnosis not present

## 2020-03-05 DIAGNOSIS — J029 Acute pharyngitis, unspecified: Secondary | ICD-10-CM | POA: Diagnosis not present

## 2020-03-05 DIAGNOSIS — E119 Type 2 diabetes mellitus without complications: Secondary | ICD-10-CM | POA: Insufficient documentation

## 2020-03-05 DIAGNOSIS — B9789 Other viral agents as the cause of diseases classified elsewhere: Secondary | ICD-10-CM | POA: Diagnosis not present

## 2020-03-05 DIAGNOSIS — J069 Acute upper respiratory infection, unspecified: Secondary | ICD-10-CM | POA: Diagnosis not present

## 2020-03-05 DIAGNOSIS — Z87891 Personal history of nicotine dependence: Secondary | ICD-10-CM | POA: Insufficient documentation

## 2020-03-05 DIAGNOSIS — J90 Pleural effusion, not elsewhere classified: Secondary | ICD-10-CM | POA: Diagnosis not present

## 2020-03-05 DIAGNOSIS — E039 Hypothyroidism, unspecified: Secondary | ICD-10-CM | POA: Diagnosis not present

## 2020-03-05 DIAGNOSIS — R0602 Shortness of breath: Secondary | ICD-10-CM | POA: Diagnosis not present

## 2020-03-05 DIAGNOSIS — R05 Cough: Secondary | ICD-10-CM | POA: Diagnosis not present

## 2020-03-05 DIAGNOSIS — I10 Essential (primary) hypertension: Secondary | ICD-10-CM | POA: Diagnosis not present

## 2020-03-05 DIAGNOSIS — J45909 Unspecified asthma, uncomplicated: Secondary | ICD-10-CM | POA: Diagnosis not present

## 2020-03-05 LAB — BASIC METABOLIC PANEL
Anion gap: 13 (ref 5–15)
BUN: 11 mg/dL (ref 8–23)
CO2: 21 mmol/L — ABNORMAL LOW (ref 22–32)
Calcium: 8.9 mg/dL (ref 8.9–10.3)
Chloride: 104 mmol/L (ref 98–111)
Creatinine, Ser: 0.91 mg/dL (ref 0.44–1.00)
GFR calc Af Amer: >60 mL/min (ref >60)
GFR calc non Af Amer: >60 mL/min (ref >60)
Glucose, Bld: 177 mg/dL — ABNORMAL HIGH (ref 70–99)
Potassium: 3.8 mmol/L (ref 3.5–5.1)
Sodium: 138 mmol/L (ref 135–145)

## 2020-03-05 LAB — SARS CORONAVIRUS 2 BY RT PCR (HOSPITAL ORDER, PERFORMED IN ~~LOC~~ HOSPITAL LAB): SARS Coronavirus 2: NEGATIVE

## 2020-03-05 LAB — CBC WITH DIFFERENTIAL/PLATELET
Abs Immature Granulocytes: 0.02 10*3/uL (ref 0.00–0.07)
Basophils Absolute: 0.1 10*3/uL (ref 0.0–0.1)
Basophils Relative: 1 %
Eosinophils Absolute: 0 10*3/uL (ref 0.0–0.5)
Eosinophils Relative: 0 %
HCT: 40.6 % (ref 36.0–46.0)
Hemoglobin: 13.3 g/dL (ref 12.0–15.0)
Immature Granulocytes: 0 %
Lymphocytes Relative: 10 %
Lymphs Abs: 0.8 10*3/uL (ref 0.7–4.0)
MCH: 29 pg (ref 26.0–34.0)
MCHC: 32.8 g/dL (ref 30.0–36.0)
MCV: 88.6 fL (ref 80.0–100.0)
Monocytes Absolute: 0.8 10*3/uL (ref 0.1–1.0)
Monocytes Relative: 10 %
Neutro Abs: 6.5 10*3/uL (ref 1.7–7.7)
Neutrophils Relative %: 79 %
Platelets: 232 10*3/uL (ref 150–400)
RBC: 4.58 MIL/uL (ref 3.87–5.11)
RDW: 14.7 % (ref 11.5–15.5)
WBC: 8.3 10*3/uL (ref 4.0–10.5)
nRBC: 0 % (ref 0.0–0.2)

## 2020-03-05 LAB — TROPONIN I (HIGH SENSITIVITY): Troponin I (High Sensitivity): 3 ng/L (ref <18)

## 2020-03-05 LAB — BRAIN NATRIURETIC PEPTIDE: B Natriuretic Peptide: 67 pg/mL (ref 0.0–100.0)

## 2020-03-05 MED ORDER — PREDNISONE 10 MG (21) PO TBPK
ORAL_TABLET | ORAL | 0 refills | Status: DC
Start: 2020-03-05 — End: 2020-03-22

## 2020-03-05 MED ORDER — ALBUTEROL SULFATE HFA 108 (90 BASE) MCG/ACT IN AERS: 1.0000 | INHALATION_SPRAY | Freq: Four times a day (QID) | RESPIRATORY_TRACT | 0 refills | Status: DC | PRN

## 2020-03-05 MED ORDER — BENZONATATE 100 MG PO CAPS
100.0000 mg | ORAL_CAPSULE | Freq: Three times a day (TID) | ORAL | 0 refills | Status: DC | PRN
Start: 2020-03-05 — End: 2020-03-22

## 2020-03-05 MED ORDER — ALBUTEROL SULFATE HFA 108 (90 BASE) MCG/ACT IN AERS
2.0000 | INHALATION_SPRAY | Freq: Once | RESPIRATORY_TRACT | Status: AC
  Administered 2020-03-05: 2 via RESPIRATORY_TRACT
  Filled 2020-03-05: qty 6.7

## 2020-03-05 MED ORDER — ACETAMINOPHEN 500 MG PO TABS
1000.0000 mg | ORAL_TABLET | Freq: Once | ORAL | Status: AC
  Administered 2020-03-05: 1000 mg via ORAL
  Filled 2020-03-05: qty 2

## 2020-03-05 MED ORDER — METOPROLOL TARTRATE 50 MG PO TABS
50.0000 mg | ORAL_TABLET | Freq: Once | ORAL | Status: AC
  Administered 2020-03-05: 50 mg via ORAL
  Filled 2020-03-05: qty 1

## 2020-03-05 MED ORDER — AZITHROMYCIN 250 MG PO TABS
250.0000 mg | ORAL_TABLET | Freq: Every day | ORAL | 0 refills | Status: DC
Start: 2020-03-05 — End: 2020-03-22

## 2020-03-05 MED ORDER — PREDNISONE 50 MG PO TABS
60.0000 mg | ORAL_TABLET | Freq: Once | ORAL | Status: AC
  Administered 2020-03-05: 60 mg via ORAL
  Filled 2020-03-05: qty 1

## 2020-03-05 NOTE — ED Provider Notes (Signed)
Emergency Department Provider Note   I have reviewed the triage vital signs and the nursing notes.   HISTORY  Chief Complaint Cough   HPI [name] is a 69 y.o. female with PMH of Asthma, GERD, HLD, HTN, and IBS presents to the emergency department for evaluation of persistent cough, mild shortness of breath, and chest pain.  Patient's chest pain is a "pulling" pain in the center of the chest which occurs with coughing.  No significant discomfort at rest.  Patient denies any hemoptysis, vomiting, diarrhea.  She is up-to-date on Covid vaccines.  No obvious sick contact.  Patient has tried over-the-counter cough medications with no relief in symptoms.  She has not tried her albuterol.  She reports not taking any of her medicines this morning prior to coming to the ED including metoprolol. CP is center of the chest, with coughing, and non-radiating.    Past Medical History:  Diagnosis Date  . Arthritis   . Asthma    takes Singulair nightly  . Diabetes mellitus    takes levemir daily and Metformin daily  . Generalized headaches   . GERD (gastroesophageal reflux disease)    takes Omeprazole daily  . Herniated disc, cervical   . History of kidney stones   . Hyperlipidemia    takes Welchol daily  . Hypertension    takes Enalapril dialy  . Hypothyroidism    takes Synthroid daily  . IBS (irritable bowel syndrome)   . Joint pain    hands and neck  . Nocturia   . PONV (postoperative nausea and vomiting)   . Seizures ([place])    denies  . TIA (transient ischemic attack)    diagnosed by EEG but never knew she had it  . Trigger finger   . Weakness   . Weakness    in both hands    Patient Active Problem List   Diagnosis Date Noted  . Diabetes mellitus without complication ([place]) 65/46/5035  . Primary hypothyroidism 07/06/2015  . Benign hypertension 07/06/2015  . Mixed hyperlipidemia 07/06/2015  . HNP (herniated nucleus pulposus), cervical 10/18/2013  . GERD  (gastroesophageal reflux disease) 10/15/2012  . Abdominal pain, chronic, right upper quadrant 10/15/2012    Past Surgical History:  Procedure Laterality Date  . ABDOMINAL HYSTERECTOMY  1980  . ANTERIOR CERVICAL DECOMP/DISCECTOMY FUSION N/A 10/18/2013   Procedure: Cervical three-four, Cervical four-five, Cervical seven-Thoracic one anterior cervical decompression with fusion plating and bonegraft;  Surgeon: Hosie Spangle, MD;  Location: [facility];  Service: Neurosurgery;  Laterality: N/A;  Cervical three-four, Cervical four-five, Cervical seven-Thoracic one anterior cervical decompression with fusion plating and bonegraft  . APPENDECTOMY     done with hysterectomy  . BACK SURGERY  2007   lumbar fusion  . carpel tunnel Bilateral    left first then right  . cataract implants    . COLONOSCOPY    . ESOPHAGOGASTRODUODENOSCOPY     hx of h pylori  . EYE SURGERY  11/18/2008   right - cataracts  . EYE SURGERY  06/01/2009   left - cataracts  . herniated disc   10/2000, 08/31/2008   C5-C6 then C6-C7  . knee anthroscopy bilateral Bilateral   . KNEE ARTHROSCOPY    . LITHOTRIPSY  11/12/2007   right side  . Parathyroid surgery     October 2013  . PARATHYROIDECTOMY  07/02/2012   Procedure: PARATHYROIDECTOMY;  Surgeon: Earnstine Regal, MD;  Location: [facility];  Service: General;  Laterality: N/A;  .  TONSILLECTOMY     as a child    Allergies Advair diskus [fluticasone-salmeterol], Codeine, Hydrocodone-acetaminophen, Oxycodone-acetaminophen, and Tetracyclines & related  Family History  Problem Relation Age of Onset  . Cancer Mother        pancreatic    Social History Social History   Tobacco Use  . Smoking status: Former Smoker    Packs/day: 1.00    Years: 40.00    Pack years: 40.00    Quit date: 07/19/2012    Years since quitting: 7.6  . Smokeless tobacco: Never Used  . Tobacco comment: quit smoking in Oct 2013  Substance Use Topics  . Alcohol use: No    Alcohol/week: 0.0 standard  drinks  . Drug use: No    Review of Systems  Constitutional: Subjective fever.  Eyes: No visual changes. ENT: No sore throat. Positive nasal congestion.  Cardiovascular: Positive chest pain. Respiratory: Positive shortness of breath and cough.  Gastrointestinal: No abdominal pain.  No nausea, no vomiting.  No diarrhea.  No constipation. Genitourinary: Negative for dysuria. Musculoskeletal: Negative for back pain. Skin: Negative for rash. Neurological: Negative for headaches, focal weakness or numbness.  10-point ROS otherwise negative.  ____________________________________________   PHYSICAL EXAM:  VITAL SIGNS: ED Triage Vitals  Enc Vitals Group     BP 03/05/20 0730 (!) 144/77     Pulse Rate 03/05/20 0726 (!) 121     Resp 03/05/20 0726 20     Temp 03/05/20 0726 98.4 F (36.9 C)     Temp Source 03/05/20 0726 Oral     SpO2 03/05/20 0726 95 %     Weight 03/05/20 0725 143 lb (64.9 kg)     Height 03/05/20 0725 5\' 6"  (1.676 m)   Constitutional: Alert and oriented. Well appearing and in no acute distress. Frequent coughing.  Eyes: Conjunctivae are normal.  Head: Atraumatic. Nose: No congestion/rhinnorhea. Mouth/Throat: Mucous membranes are moist.  Neck: No stridor.  Cardiovascular: Sinus tachycardia. Good peripheral circulation. Grossly normal heart sounds.   Respiratory: Mild increased respiratory effort with frequent coughing.  No retractions. Lungs with no end-expiratory wheezing or rales but bronchospastic cough.  Gastrointestinal: Soft and nontender. No distention.  Musculoskeletal: No gross deformities of extremities. Neurologic:  Normal speech and language.  Skin:  Skin is warm, dry and intact. No rash noted.  ____________________________________________   LABS (all labs ordered are listed, but only abnormal results are displayed)  Labs Reviewed  BASIC METABOLIC PANEL - Abnormal; Notable for the following components:      Result Value   CO2 21 (*)     Glucose, Bld 177 (*)    All other components within normal limits  SARS CORONAVIRUS 2 BY RT PCR (HOSPITAL ORDER, [place] [facility])  BRAIN NATRIURETIC PEPTIDE  CBC WITH DIFFERENTIAL/PLATELET  TROPONIN I (HIGH SENSITIVITY)  TROPONIN I (HIGH SENSITIVITY)   ____________________________________________  EKG   EKG Interpretation  Date/Time:  Sunday March 05 2020 07:28:57 EDT Ventricular Rate:  121 PR Interval:    QRS Duration: 141 QT Interval:  400 QTC Calculation: 568 R Axis:   34 Text Interpretation: Sinus tachycardia Nonspecific intraventricular conduction delay Borderline repol abnormality, diffuse leads Baseline wander in lead(s) V1 V5 ST changes likley rate related. No STEMI Confirmed by Nanda Quinton [phone]) on 03/05/2020 8:05:04 AM       ____________________________________________  RADIOLOGY  DG Chest Portable 1 View  Result Date: 03/05/2020 CLINICAL DATA:  Cough. Symptoms started 6 days ago. The RIGHT and sore throat. Congestion.  History of asthma. EXAM: PORTABLE CHEST 1 VIEW COMPARISON:  10/31/2015 FINDINGS: Heart size is normal. Lungs are free of focal consolidations and pleural effusions. No pulmonary edema. Remote cervical fusion. IMPRESSION: Negative. Electronically Signed   By: Nolon Nations M.D.   On: 03/05/2020 08:31    ____________________________________________   PROCEDURES  Procedure(s) performed:   Procedures  None  ____________________________________________   INITIAL IMPRESSION / ASSESSMENT AND PLAN / ED COURSE  Pertinent labs & imaging results that were available during my care of the patient were reviewed by me and considered in my medical decision making (see chart for details).   Patient presents to the emergency department with frequent cough, shortness of breath, URI/bronchitis type symptoms.  Low suspicion clinically for PE/ACS.  Patient does have sinus tachycardia but did not take her metoprolol and other blood  pressure medicines this morning.  Will give that here.  Patient does not appear septic or toxic.  Plan for albuterol inhaler, Covid test, labs, troponin, chest x-ray.   09:45 AM  Patient improved with treatment here in the emergency department.  Will discharge home with Tessalon, azithromycin, steroid with taper, albuterol.  Covid negative.  Labs reviewed including normal troponin and BNP.  With 6 days of symptoms will not repeat troponin.  Heart rate decreasing with home medication.  ____________________________________________  FINAL CLINICAL IMPRESSION(S) / ED DIAGNOSES  Final diagnoses:  Viral URI with cough     MEDICATIONS GIVEN DURING THIS VISIT:  Medications  metoprolol tartrate (LOPRESSOR) tablet 50 mg (50 mg Oral Given 03/05/20 0805)  albuterol (VENTOLIN HFA) 108 (90 Base) MCG/ACT inhaler 2 puff (2 puffs Inhalation Given 03/05/20 0806)  predniSONE (DELTASONE) tablet 60 mg (60 mg Oral Given 03/05/20 0805)  acetaminophen (TYLENOL) tablet 1,000 mg (1,000 mg Oral Given 03/05/20 0905)     NEW OUTPATIENT MEDICATIONS STARTED DURING THIS VISIT:  New Prescriptions   ALBUTEROL (VENTOLIN HFA) 108 (90 BASE) MCG/ACT INHALER    Inhale 1-2 puffs into the lungs every 6 (six) hours as needed for wheezing or shortness of breath.   AZITHROMYCIN (ZITHROMAX) 250 MG TABLET    Take 1 tablet (250 mg total) by mouth daily. Take first 2 tablets together, then 1 every day until finished.   BENZONATATE (TESSALON) 100 MG CAPSULE    Take 1 capsule (100 mg total) by mouth 3 (three) times daily as needed for cough.   PREDNISONE (STERAPRED UNI-PAK 21 TAB) 10 MG (21) TBPK TABLET    Take 6 tabs by mouth daily  for 2 days, then 5 tabs for 2 days, then 4 tabs for 2 days, then 3 tabs for 2 days, 2 tabs for 2 days, then 1 tab by mouth daily for 2 days    Note:  This document was prepared using Dragon voice recognition software and may include unintentional dictation errors.  Nanda Quinton, MD, [facility]  [facility]    Rishab Stoudt, [facility], MD 03/05/20 [phone]

## 2020-03-05 NOTE — ED Notes (Signed)
ED Provider at bedside. 

## 2020-03-05 NOTE — Discharge Instructions (Signed)
You were seen in the emergency department today with cough and viral upper respiratory infection.  I have called in some cough medications as well as steroid, albuterol, antibiotic.  Please pick these up at your pharmacy.  Follow with your primary care doctor closely in the coming week and return to the emergency department immediately if you develop any new or suddenly worsening symptoms.

## 2020-03-05 NOTE — ED Triage Notes (Signed)
Pt reports 6 days ago started having earache and sore throat then started with cough and congestion.  Pt says has history of asthma.  Reports has had both covid vaccines.  C/o chest and epigastric pain, worse with coughing.

## 2020-03-07 ENCOUNTER — Encounter (INDEPENDENT_AMBULATORY_CARE_PROVIDER_SITE_OTHER): Payer: Self-pay | Admitting: Internal Medicine

## 2020-03-07 ENCOUNTER — Telehealth (INDEPENDENT_AMBULATORY_CARE_PROVIDER_SITE_OTHER): Payer: Medicare Other | Admitting: Internal Medicine

## 2020-03-07 DIAGNOSIS — J069 Acute upper respiratory infection, unspecified: Secondary | ICD-10-CM | POA: Diagnosis not present

## 2020-03-07 NOTE — Progress Notes (Signed)
Metrics: Intervention Frequency ACO  Documented Smoking Status Yearly  Screened one or more times in 24 months  Cessation Counseling or  Active cessation medication Past 24 months  Past 24 months   Guideline developer: UpToDate (See UpToDate for funding source) Date Released: 2014       Wellness Office Visit  Subjective:  Patient ID: [name], female    DOB: 1951/02/05  Age: 69 y.o. MRN: [id number]  CC: This is a audio telemedicine visit with the permission of the patient who is at home and I am in my office.  I used 2 separate identifiers to identify the patient. This is a follow-up visit from her recent emergency room visit with chief complaints of cough. HPI  I reviewed all the emergency room records which show that she likely has an upper respiratory tract infection, probably viral.  Her COVID-19 test was negative.  Chest x-ray was negative.  Other blood work was unremarkable with no increased white count.  She tells me that she is improving.  She was sent home with azithromycin and steroids and Tessalon Perles.  Her cough is still bothering her and she is going to take over-the-counter equivalent of Robitussin-DM now.  Otherwise, her energy levels are improving and her cough is improving.  She is not wheezing.  She has no fever. Past Medical History:  Diagnosis Date  . Arthritis   . Asthma    takes Singulair nightly  . Diabetes mellitus    takes levemir daily and Metformin daily  . Generalized headaches   . GERD (gastroesophageal reflux disease)    takes Omeprazole daily  . Herniated disc, cervical   . History of kidney stones   . Hyperlipidemia    takes Welchol daily  . Hypertension    takes Enalapril dialy  . Hypothyroidism    takes Synthroid daily  . IBS (irritable bowel syndrome)   . Joint pain    hands and neck  . Nocturia   . PONV (postoperative nausea and vomiting)   . Seizures ([place])    denies  . TIA (transient ischemic attack)    diagnosed by EEG but  never knew she had it  . Trigger finger   . Weakness   . Weakness    in both hands   Past Surgical History:  Procedure Laterality Date  . ABDOMINAL HYSTERECTOMY  1980  . ANTERIOR CERVICAL DECOMP/DISCECTOMY FUSION N/A 10/18/2013   Procedure: Cervical three-four, Cervical four-five, Cervical seven-Thoracic one anterior cervical decompression with fusion plating and bonegraft;  Surgeon: Hosie Spangle, MD;  Location: [facility];  Service: Neurosurgery;  Laterality: N/A;  Cervical three-four, Cervical four-five, Cervical seven-Thoracic one anterior cervical decompression with fusion plating and bonegraft  . APPENDECTOMY     done with hysterectomy  . BACK SURGERY  2007   lumbar fusion  . carpel tunnel Bilateral    left first then right  . cataract implants    . COLONOSCOPY    . ESOPHAGOGASTRODUODENOSCOPY     hx of h pylori  . EYE SURGERY  11/18/2008   right - cataracts  . EYE SURGERY  06/01/2009   left - cataracts  . herniated disc   10/2000, 08/31/2008   C5-C6 then C6-C7  . knee anthroscopy bilateral Bilateral   . KNEE ARTHROSCOPY    . LITHOTRIPSY  11/12/2007   right side  . Parathyroid surgery     October 2013  . PARATHYROIDECTOMY  07/02/2012   Procedure: PARATHYROIDECTOMY;  Surgeon: Merlinda Frederick  Gerkin, MD;  Location: [facility];  Service: General;  Laterality: N/A;  . TONSILLECTOMY     as a child     Family History  Problem Relation Age of Onset  . Cancer Mother        pancreatic    Social History   Social History Narrative   Widow,husband died 08-18-2019 after 66 years marriage.Retired.   Social History   Tobacco Use  . Smoking status: Former Smoker    Packs/day: 1.00    Years: 40.00    Pack years: 40.00    Quit date: 07/19/2012    Years since quitting: 7.6  . Smokeless tobacco: Never Used  . Tobacco comment: quit smoking in 08/17/2012  Substance Use Topics  . Alcohol use: No    Alcohol/week: 0.0 standard drinks    Current Meds  Medication Sig  . albuterol  (VENTOLIN HFA) 108 (90 Base) MCG/ACT inhaler Inhale 1-2 puffs into the lungs every 6 (six) hours as needed for wheezing or shortness of breath.  Marland Kitchen amLODipine (NORVASC) 5 MG tablet TAKE 1 TABLET BY MOUTH DAILY  . azithromycin (ZITHROMAX) 250 MG tablet Take 1 tablet (250 mg total) by mouth daily. Take first 2 tablets together, then 1 every day until finished.  . benzonatate (TESSALON) 100 MG capsule Take 1 capsule (100 mg total) by mouth 3 (three) times daily as needed for cough.  . estradiol (ESTRACE) 1 MG tablet TAKE ONE TABLET BY MOUTH ONCE DAILY.  . metoprolol tartrate (LOPRESSOR) 50 MG tablet TAKE 1 TABLET BY MOUTH TWICE DAILY. (Patient taking differently: Take 50 mg by mouth daily. )  . NP THYROID 90 MG tablet TAKE 1 TABLET BY MOUTH ONCE DAILY    **NEW DOSE**  . predniSONE (STERAPRED UNI-PAK 21 TAB) 10 MG (21) TBPK tablet Take 6 tabs by mouth daily  for 2 days, then 5 tabs for 2 days, then 4 tabs for 2 days, then 3 tabs for 2 days, 2 tabs for 2 days, then 1 tab by mouth daily for 2 days       Depression screen [facility] 2/9 01/17/2020 07/01/2018 02/25/2018 08/26/2017 04/24/2017  Decreased Interest 0 0 0 0 0  Down, Depressed, Hopeless 0 0 0 0 0  PHQ - 2 Score 0 0 0 0 0     Objective:   Today's Vitals: There were no vitals taken for this visit. Vitals with BMI 03/05/2020 03/05/2020 03/05/2020  Height - - -  Weight - - -  BMI - - -  Systolic [phone]  Diastolic 57 75 58  Pulse 93 99 104     Physical Exam  On the phone, her voice was slightly hoarse but she was alert and orientated.  She had intermittent coughing throughout the phone conversation.  She did not appear to be short of breath.     Assessment   1. Upper respiratory tract infection, unspecified type       Tests ordered No orders of the defined types were placed in this encounter.    Plan: 1. She will continue with the therapy that was already initiated from the emergency room.  I have told her that if she is not  continuing to improve or is getting worse in the next 2 to 3 days, she must call the office and we will see her in the office. 2. This phone call lasted 8 minutes and 41 seconds   No orders of the defined types were placed in this encounter.  Doree Albee, MD

## 2020-03-09 ENCOUNTER — Telehealth (INDEPENDENT_AMBULATORY_CARE_PROVIDER_SITE_OTHER): Payer: Self-pay

## 2020-03-09 ENCOUNTER — Other Ambulatory Visit (INDEPENDENT_AMBULATORY_CARE_PROVIDER_SITE_OTHER): Payer: Self-pay | Admitting: Internal Medicine

## 2020-03-09 NOTE — Telephone Encounter (Signed)
Please send it to [facility]  

## 2020-03-09 NOTE — Telephone Encounter (Signed)
I will be happy to send a refill.  Which pharmacy which she like to use?

## 2020-03-09 NOTE — Telephone Encounter (Signed)
[name] is calling stating that she has 3 antibiotics left she still has a lot of drainage down her right side, sore throat, ear pain she is asking should she get a refill on antibiotics or let it run its course, please advise?

## 2020-03-13 ENCOUNTER — Other Ambulatory Visit (HOSPITAL_COMMUNITY): Payer: Self-pay | Admitting: Obstetrics and Gynecology

## 2020-03-13 DIAGNOSIS — Z1231 Encounter for screening mammogram for malignant neoplasm of breast: Secondary | ICD-10-CM

## 2020-03-20 ENCOUNTER — Telehealth (INDEPENDENT_AMBULATORY_CARE_PROVIDER_SITE_OTHER): Payer: Self-pay | Admitting: Internal Medicine

## 2020-03-20 NOTE — Telephone Encounter (Signed)
Pt requesting a call back from [facility]

## 2020-03-20 NOTE — Telephone Encounter (Signed)
Return call to patient; lvm to call back with details.

## 2020-03-22 ENCOUNTER — Ambulatory Visit (INDEPENDENT_AMBULATORY_CARE_PROVIDER_SITE_OTHER): Payer: Medicare Other | Admitting: Internal Medicine

## 2020-03-22 ENCOUNTER — Other Ambulatory Visit: Payer: Self-pay

## 2020-03-22 ENCOUNTER — Encounter (INDEPENDENT_AMBULATORY_CARE_PROVIDER_SITE_OTHER): Payer: Self-pay | Admitting: Internal Medicine

## 2020-03-22 VITALS — BP 140/60 | HR 70 | Temp 97.1°F | Resp 18 | Ht 66.0 in | Wt 143.6 lb

## 2020-03-22 DIAGNOSIS — J069 Acute upper respiratory infection, unspecified: Secondary | ICD-10-CM | POA: Diagnosis not present

## 2020-03-22 MED ORDER — AZITHROMYCIN 250 MG PO TABS: 250.0000 mg | ORAL_TABLET | Freq: Every day | ORAL | 0 refills | Status: DC

## 2020-03-22 NOTE — Progress Notes (Signed)
Metrics: Intervention Frequency ACO  Documented Smoking Status Yearly  Screened one or more times in 24 months  Cessation Counseling or  Active cessation medication Past 24 months  Past 24 months   Guideline developer: UpToDate (See UpToDate for funding source) Date Released: 2014       Wellness Office Visit  Subjective:  Patient ID: [name], female    DOB: April 16, 1951  Age: 69 y.o. MRN: [id number]  CC: Cough, nonproductive.  Earache. HPI  This lady comes in again with upper respiratory tract infection symptoms.  Her illness started approximately 3 weeks ago when she went to the emergency room.  Investigations were largely negative including a chest x-ray which did not show any abnormalities.  COVID-19 test was negative.  She has been fully vaccinated with COVID-19 vaccine. I did a telemedicine visit with her the day after and since that time, she had improved but then she has gone backwards.  I did send another repeat prescription of Zithromax but she did not fill it as she felt this was a virus and would improve soon.  However it has not improved.  She denies any fever, there is no cough production, no body aches and no significant dyspnea.  She does have a left-sided earache. Past Medical History:  Diagnosis Date  . Arthritis   . Asthma    takes Singulair nightly  . Diabetes mellitus    takes levemir daily and Metformin daily  . Generalized headaches   . GERD (gastroesophageal reflux disease)    takes Omeprazole daily  . Herniated disc, cervical   . History of kidney stones   . Hyperlipidemia    takes Welchol daily  . Hypertension    takes Enalapril dialy  . Hypothyroidism    takes Synthroid daily  . IBS (irritable bowel syndrome)   . Joint pain    hands and neck  . Nocturia   . PONV (postoperative nausea and vomiting)   . Seizures ([place])    denies  . TIA (transient ischemic attack)    diagnosed by EEG but never knew she had it  . Trigger finger   . Weakness     . Weakness    in both hands   Past Surgical History:  Procedure Laterality Date  . ABDOMINAL HYSTERECTOMY  1980  . ANTERIOR CERVICAL DECOMP/DISCECTOMY FUSION N/A 10/18/2013   Procedure: Cervical three-four, Cervical four-five, Cervical seven-Thoracic one anterior cervical decompression with fusion plating and bonegraft;  Surgeon: Hosie Spangle, MD;  Location: [place] [facility];  Service: Neurosurgery;  Laterality: N/A;  Cervical three-four, Cervical four-five, Cervical seven-Thoracic one anterior cervical decompression with fusion plating and bonegraft  . APPENDECTOMY     done with hysterectomy  . BACK SURGERY  2007   lumbar fusion  . carpel tunnel Bilateral    left first then right  . cataract implants    . COLONOSCOPY    . ESOPHAGOGASTRODUODENOSCOPY     hx of h pylori  . EYE SURGERY  11/18/2008   right - cataracts  . EYE SURGERY  06/01/2009   left - cataracts  . herniated disc   10/2000, 08/31/2008   C5-C6 then C6-C7  . knee anthroscopy bilateral Bilateral   . KNEE ARTHROSCOPY    . LITHOTRIPSY  11/12/2007   right side  . Parathyroid surgery     October 2013  . PARATHYROIDECTOMY  07/02/2012   Procedure: PARATHYROIDECTOMY;  Surgeon: Earnstine Regal, MD;  Location: [facility];  Service: General;  Laterality:  N/A;  . TONSILLECTOMY     as a child     Family History  Problem Relation Age of Onset  . Cancer Mother        pancreatic    Social History   Social History Narrative   Widow,husband died 26-Jul-2019 after 88 years marriage.Retired.   Social History   Tobacco Use  . Smoking status: Former Smoker    Packs/day: 1.00    Years: 40.00    Pack years: 40.00    Quit date: 07/19/2012    Years since quitting: 7.6  . Smokeless tobacco: Never Used  . Tobacco comment: quit smoking in 25-Jul-2012  Substance Use Topics  . Alcohol use: No    Alcohol/week: 0.0 standard drinks    Current Meds  Medication Sig  . albuterol (VENTOLIN HFA) 108 (90 Base) MCG/ACT inhaler Inhale 1-2  puffs into the lungs every 6 (six) hours as needed for wheezing or shortness of breath.  Marland Kitchen amLODipine (NORVASC) 5 MG tablet TAKE 1 TABLET BY MOUTH DAILY  . azithromycin (ZITHROMAX) 250 MG tablet Take 1 tablet (250 mg total) by mouth daily. Take first 2 tablets together, then 1 every day until finished.  . estradiol (ESTRACE) 1 MG tablet TAKE ONE TABLET BY MOUTH ONCE DAILY.  . metoprolol tartrate (LOPRESSOR) 50 MG tablet TAKE 1 TABLET BY MOUTH TWICE DAILY. (Patient taking differently: Take 50 mg by mouth daily. )  . NP THYROID 90 MG tablet TAKE 1 TABLET BY MOUTH ONCE DAILY    **NEW DOSE**  . [DISCONTINUED] azithromycin (ZITHROMAX) 250 MG tablet Take 1 tablet (250 mg total) by mouth daily. Take first 2 tablets together, then 1 every day until finished.  . [DISCONTINUED] benzonatate (TESSALON) 100 MG capsule Take 1 capsule (100 mg total) by mouth 3 (three) times daily as needed for cough.  . [DISCONTINUED] predniSONE (STERAPRED UNI-PAK 21 TAB) 10 MG (21) TBPK tablet Take 6 tabs by mouth daily  for 2 days, then 5 tabs for 2 days, then 4 tabs for 2 days, then 3 tabs for 2 days, 2 tabs for 2 days, then 1 tab by mouth daily for 2 days       Depression screen [facility] 2/9 01/17/2020 07/01/2018 02/25/2018 08/26/2017 04/24/2017  Decreased Interest 0 0 0 0 0  Down, Depressed, Hopeless 0 0 0 0 0  PHQ - 2 Score 0 0 0 0 0     Objective:   Today's Vitals: BP 140/60 (BP Location: Left Arm, Patient Position: Sitting, Cuff Size: Normal)   Pulse 70   Temp (!) 97.1 F (36.2 C) (Temporal)   Resp 18   Ht 5\' 6"  (1.676 m)   Wt 143 lb 9.6 oz (65.1 kg)   SpO2 98%   BMI 23.18 kg/m  Vitals with BMI 03/22/2020 03/05/2020 03/05/2020  Height 5\' 6"  - -  Weight 143 lbs 10 oz - -  BMI 44.03 - -  Systolic 474 259 563  Diastolic 60 57 75  Pulse 70 93 99     Physical Exam   She looks systemically well.  She is not septic clinically.  Both tympanic membranes are clear and no evidence of infection.  Throat looks clear with  no inflammation.  Lung fields are entirely clear with no evidence of crackles, wheezing or bronchial breathing.    Assessment   1. Upper respiratory tract infection, unspecified type       Tests ordered No orders of the defined types were placed in this  encounter.    Plan: 1. We discussed the possible diagnoses including COVID-19, atypical bacteria or other viral etiology.  I do not think she has a bacterial typical infection.  In the end she agreed to try another course of Zithromax.  She will take Robitussin-DM over-the-counter for symptom medic relief from the cough and she has found that Benadryl seems to help her symptoms and she will go back on this. 2. If she does not improve, she will let me know but otherwise I will see her in the middle of July for annual physical exam   Meds ordered this encounter  Medications  . azithromycin (ZITHROMAX) 250 MG tablet    Sig: Take 1 tablet (250 mg total) by mouth daily. Take first 2 tablets together, then 1 every day until finished.    Dispense:  6 tablet    Refill:  0    Jaevian Shean Luther Parody, MD

## 2020-03-27 ENCOUNTER — Other Ambulatory Visit: Payer: Self-pay

## 2020-03-27 ENCOUNTER — Ambulatory Visit (HOSPITAL_COMMUNITY)
Admission: RE | Admit: 2020-03-27 | Discharge: 2020-03-27 | Disposition: A | Discharge status: Home / Self Care | Payer: Medicare Other | Source: Ambulatory Visit | Attending: Obstetrics and Gynecology | Admitting: Obstetrics and Gynecology

## 2020-03-27 DIAGNOSIS — Z1231 Encounter for screening mammogram for malignant neoplasm of breast: Secondary | ICD-10-CM | POA: Insufficient documentation

## 2020-03-29 ENCOUNTER — Ambulatory Visit (HOSPITAL_COMMUNITY): Payer: Medicare Other

## 2020-04-03 ENCOUNTER — Other Ambulatory Visit (INDEPENDENT_AMBULATORY_CARE_PROVIDER_SITE_OTHER): Payer: Self-pay | Admitting: Internal Medicine

## 2020-04-17 ENCOUNTER — Encounter (INDEPENDENT_AMBULATORY_CARE_PROVIDER_SITE_OTHER): Payer: Self-pay | Admitting: Internal Medicine

## 2020-04-17 ENCOUNTER — Ambulatory Visit (INDEPENDENT_AMBULATORY_CARE_PROVIDER_SITE_OTHER): Payer: Medicare Other | Admitting: Internal Medicine

## 2020-04-17 ENCOUNTER — Other Ambulatory Visit: Payer: Self-pay

## 2020-04-17 VITALS — BP 130/72 | HR 69 | Temp 96.1°F | Resp 18 | Ht 66.0 in | Wt 145.0 lb

## 2020-04-17 DIAGNOSIS — E559 Vitamin D deficiency, unspecified: Secondary | ICD-10-CM | POA: Diagnosis not present

## 2020-04-17 DIAGNOSIS — I10 Essential (primary) hypertension: Secondary | ICD-10-CM

## 2020-04-17 DIAGNOSIS — E039 Hypothyroidism, unspecified: Secondary | ICD-10-CM | POA: Diagnosis not present

## 2020-04-17 DIAGNOSIS — E119 Type 2 diabetes mellitus without complications: Secondary | ICD-10-CM | POA: Diagnosis not present

## 2020-04-17 MED ORDER — NP THYROID 90 MG PO TABS: ORAL_TABLET | ORAL | 1 refills | Status: DC

## 2020-04-17 MED ORDER — AMLODIPINE BESYLATE 5 MG PO TABS: 5.0000 mg | ORAL_TABLET | Freq: Every day | ORAL | 1 refills | Status: DC

## 2020-04-17 NOTE — Progress Notes (Signed)
Metrics: Intervention Frequency ACO  Documented Smoking Status Yearly  Screened one or more times in 24 months  Cessation Counseling or  Active cessation medication Past 24 months  Past 24 months   Guideline developer: UpToDate (See UpToDate for funding source) Date Released: 2014       Wellness Office Visit  Subjective:  Patient ID: [name], female    DOB: [birth year]  Age: 69 y.o. MRN: [id number]  CC: This lady comes in for follow-up of her diabetes, hypothyroidism, hypertension and vitamin D deficiency. HPI  She says that she has not been as consistent with nutrition more recently and since the death of her husband, she finds it difficult to cook for herself only.  Her last hemoglobin A1c was 5.8%. She continues on NP thyroid which I have refilled today for her hypothyroidism and her T3 levels were in a good range last time. She continues with amlodipine for hypertension. She also continues on estradiol which she is tolerating well. More recently, she has had an upper respiratory tract infection which she is gradually getting over. Past Medical History:  Diagnosis Date  . Arthritis   . Asthma    takes Singulair nightly  . Diabetes mellitus    takes levemir daily and Metformin daily  . Generalized headaches   . GERD (gastroesophageal reflux disease)    takes Omeprazole daily  . Herniated disc, cervical   . History of kidney stones   . Hyperlipidemia    takes Welchol daily  . Hypertension    takes Enalapril dialy  . Hypothyroidism    takes Synthroid daily  . IBS (irritable bowel syndrome)   . Joint pain    hands and neck  . Nocturia   . PONV (postoperative nausea and vomiting)   . Seizures ([place])    denies  . TIA (transient ischemic attack)    diagnosed by EEG but never knew she had it  . Trigger finger   . Weakness   . Weakness    in both hands   Past Surgical History:  Procedure Laterality Date  . ABDOMINAL HYSTERECTOMY  1980  . ANTERIOR CERVICAL  DECOMP/DISCECTOMY FUSION N/A 10/18/2013   Procedure: Cervical three-four, Cervical four-five, Cervical seven-Thoracic one anterior cervical decompression with fusion plating and bonegraft;  Surgeon: Hosie Spangle, MD;  Location: [place] [facility];  Service: Neurosurgery;  Laterality: N/A;  Cervical three-four, Cervical four-five, Cervical seven-Thoracic one anterior cervical decompression with fusion plating and bonegraft  . APPENDECTOMY     done with hysterectomy  . BACK SURGERY  2007   lumbar fusion  . carpel tunnel Bilateral    left first then right  . cataract implants    . COLONOSCOPY    . ESOPHAGOGASTRODUODENOSCOPY     hx of h pylori  . EYE SURGERY  11/18/2008   right - cataracts  . EYE SURGERY  06/01/2009   left - cataracts  . herniated disc   10/2000, 08/31/2008   C5-C6 then C6-C7  . knee anthroscopy bilateral Bilateral   . KNEE ARTHROSCOPY    . LITHOTRIPSY  11/12/2007   right side  . Parathyroid surgery     October 2013  . PARATHYROIDECTOMY  07/02/2012   Procedure: PARATHYROIDECTOMY;  Surgeon: Earnstine Regal, MD;  Location: [facility];  Service: General;  Laterality: N/A;  . TONSILLECTOMY     as a child     Family History  Problem Relation Age of Onset  . Cancer Mother  pancreatic    Social History   Social History Narrative   Widow,husband died 08/18/2019 after 30 years marriage.Retired.   Social History   Tobacco Use  . Smoking status: Former Smoker    Packs/day: 1.00    Years: 40.00    Pack years: 40.00    Quit date: 07/19/2012    Years since quitting: 7.7  . Smokeless tobacco: Never Used  . Tobacco comment: quit smoking in Aug 17, 2012  Substance Use Topics  . Alcohol use: No    Alcohol/week: 0.0 standard drinks    Current Meds  Medication Sig  . albuterol (VENTOLIN HFA) 108 (90 Base) MCG/ACT inhaler Inhale 1-2 puffs into the lungs every 6 (six) hours as needed for wheezing or shortness of breath.  Marland Kitchen amLODipine (NORVASC) 5 MG tablet Take 1 tablet (5 mg  total) by mouth daily.  Marland Kitchen estradiol (ESTRACE) 1 MG tablet TAKE ONE TABLET BY MOUTH ONCE DAILY.  . metoprolol tartrate (LOPRESSOR) 50 MG tablet TAKE 1 TABLET BY MOUTH TWICE DAILY. (Patient taking differently: Take 50 mg by mouth daily. )  . NP THYROID 90 MG tablet TAKE 1 TABLET BY MOUTH ONCE DAILY    **NEW DOSE**  . [DISCONTINUED] amLODipine (NORVASC) 5 MG tablet TAKE 1 TABLET BY MOUTH DAILY  . [DISCONTINUED] azithromycin (ZITHROMAX) 250 MG tablet Take 1 tablet (250 mg total) by mouth daily. Take first 2 tablets together, then 1 every day until finished.  . [DISCONTINUED] NP THYROID 90 MG tablet TAKE 1 TABLET BY MOUTH ONCE DAILY    **NEW DOSE**      Depression screen [facility] 2/9 01/17/2020 07/01/2018 02/25/2018 08/26/2017 04/24/2017  Decreased Interest 0 0 0 0 0  Down, Depressed, Hopeless 0 0 0 0 0  PHQ - 2 Score 0 0 0 0 0     Objective:   Today's Vitals: BP 130/72 (BP Location: Right Arm, Patient Position: Sitting, Cuff Size: Normal)   Pulse 69   Temp (!) 96.1 F (35.6 C) (Temporal)   Resp 18   Ht 5\' 6"  (1.676 m)   Wt 145 lb (65.8 kg)   SpO2 98%   BMI 23.40 kg/m  Vitals with BMI 04/17/2020 03/22/2020 03/05/2020  Height 5\' 6"  5\' 6"  -  Weight 145 lbs 143 lbs 10 oz -  BMI 75.17 00.17 -  Systolic 494 496 759  Diastolic 72 60 57  Pulse 69 70 93     Physical Exam  She looks systemically well.  Her weight is very stable.  Blood pressure is excellent.  Alert and orientated without any focal neurological signs.     Assessment   1. Diabetes mellitus without complication ([place])   2. Primary hypothyroidism   3. Benign hypertension   4. Vitamin D deficiency disease       Tests ordered Orders Placed This Encounter  Procedures  . Hemoglobin A1c     Plan: 1. She will continue with diet alone for diabetes and I will check an A1c today. 2. She will continue with desiccated NP thyroid for hypothyroidism and levels previously were in a good range so she will continue with the same  dose and I have sent a refill. 3. Her blood pressure is well controlled on the amlodipine and I have recommended she continue with the same. 4. Follow-up in about 3 to 4 months time.   Meds ordered this encounter  Medications  . NP THYROID 90 MG tablet    Sig: TAKE 1 TABLET BY MOUTH ONCE DAILY    **  NEW DOSE**    Dispense:  90 tablet    Refill:  1  . amLODipine (NORVASC) 5 MG tablet    Sig: Take 1 tablet (5 mg total) by mouth daily.    Dispense:  90 tablet    Refill:  1    Torianne Laflam Luther Parody, MD

## 2020-04-18 LAB — HEMOGLOBIN A1C
Hgb A1c MFr Bld: 6 % of total Hgb — ABNORMAL HIGH (ref <5.7)
Mean Plasma Glucose: 126 (calc)
eAG (mmol/L): 7 (calc)

## 2020-06-26 DIAGNOSIS — Z23 Encounter for immunization: Secondary | ICD-10-CM | POA: Diagnosis not present

## 2020-06-26 DIAGNOSIS — Z01419 Encounter for gynecological examination (general) (routine) without abnormal findings: Secondary | ICD-10-CM | POA: Diagnosis not present

## 2020-07-03 ENCOUNTER — Other Ambulatory Visit (INDEPENDENT_AMBULATORY_CARE_PROVIDER_SITE_OTHER): Payer: Self-pay | Admitting: Internal Medicine

## 2020-07-27 ENCOUNTER — Other Ambulatory Visit (INDEPENDENT_AMBULATORY_CARE_PROVIDER_SITE_OTHER): Payer: Self-pay | Admitting: Internal Medicine

## 2020-08-21 ENCOUNTER — Ambulatory Visit (INDEPENDENT_AMBULATORY_CARE_PROVIDER_SITE_OTHER): Payer: Medicare Other | Admitting: Internal Medicine

## 2020-08-21 ENCOUNTER — Other Ambulatory Visit: Payer: Self-pay

## 2020-08-21 ENCOUNTER — Encounter (INDEPENDENT_AMBULATORY_CARE_PROVIDER_SITE_OTHER): Payer: Self-pay | Admitting: Internal Medicine

## 2020-08-21 VITALS — BP 116/62 | HR 72 | Temp 97.1°F | Ht 66.0 in | Wt 139.6 lb

## 2020-08-21 DIAGNOSIS — E119 Type 2 diabetes mellitus without complications: Secondary | ICD-10-CM | POA: Diagnosis not present

## 2020-08-21 DIAGNOSIS — E2839 Other primary ovarian failure: Secondary | ICD-10-CM | POA: Diagnosis not present

## 2020-08-21 DIAGNOSIS — E559 Vitamin D deficiency, unspecified: Secondary | ICD-10-CM | POA: Diagnosis not present

## 2020-08-21 DIAGNOSIS — I10 Essential (primary) hypertension: Secondary | ICD-10-CM | POA: Diagnosis not present

## 2020-08-21 DIAGNOSIS — E039 Hypothyroidism, unspecified: Secondary | ICD-10-CM

## 2020-08-21 DIAGNOSIS — E782 Mixed hyperlipidemia: Secondary | ICD-10-CM | POA: Diagnosis not present

## 2020-08-21 NOTE — Progress Notes (Signed)
Metrics: Intervention Frequency ACO  Documented Smoking Status Yearly  Screened one or more times in 24 months  Cessation Counseling or  Active cessation medication Past 24 months  Past 24 months   Guideline developer: UpToDate (See UpToDate for funding source) Date Released: 2014       Wellness Office Visit  Subjective:  Patient ID: [name], female    DOB: 08/05/1951  Age: 69 y.o. MRN: [id number]  CC: This delightful lady comes in for follow-up of diabetes, hypertension, vitamin D deficiency, hypothyroidism and postmenopausal symptoms on estradiol therapy. HPI  Overall, she is doing well and she has tightened up regarding her nutrition.  Her last hemoglobin A1c was 6%.  She is on no medications for diabetes. She has not seen an eye doctor and she realizes she needs to do that soon. She continues on amlodipine for hypertension.  She also continues on metoprolol. She continues on desiccated NP thyroid for hypothyroidism. Past Medical History:  Diagnosis Date  . Arthritis   . Asthma    takes Singulair nightly  . Diabetes mellitus    takes levemir daily and Metformin daily  . Generalized headaches   . GERD (gastroesophageal reflux disease)    takes Omeprazole daily  . Herniated disc, cervical   . History of kidney stones   . Hyperlipidemia    takes Welchol daily  . Hypertension    takes Enalapril dialy  . Hypothyroidism    takes Synthroid daily  . IBS (irritable bowel syndrome)   . Joint pain    hands and neck  . Nocturia   . PONV (postoperative nausea and vomiting)   . Seizures ([place])    denies  . TIA (transient ischemic attack)    diagnosed by EEG but never knew she had it  . Trigger finger   . Weakness   . Weakness    in both hands   Past Surgical History:  Procedure Laterality Date  . ABDOMINAL HYSTERECTOMY  1980  . ANTERIOR CERVICAL DECOMP/DISCECTOMY FUSION N/A 10/18/2013   Procedure: Cervical three-four, Cervical four-five, Cervical seven-Thoracic one  anterior cervical decompression with fusion plating and bonegraft;  Surgeon: Hosie Spangle, MD;  Location: [facility];  Service: Neurosurgery;  Laterality: N/A;  Cervical three-four, Cervical four-five, Cervical seven-Thoracic one anterior cervical decompression with fusion plating and bonegraft  . APPENDECTOMY     done with hysterectomy  . BACK SURGERY  2007   lumbar fusion  . carpel tunnel Bilateral    left first then right  . cataract implants    . COLONOSCOPY    . ESOPHAGOGASTRODUODENOSCOPY     hx of h pylori  . EYE SURGERY  11/18/2008   right - cataracts  . EYE SURGERY  06/01/2009   left - cataracts  . herniated disc   10/2000, 08/31/2008   C5-C6 then C6-C7  . knee anthroscopy bilateral Bilateral   . KNEE ARTHROSCOPY    . LITHOTRIPSY  11/12/2007   right side  . Parathyroid surgery     08-20-2012  . PARATHYROIDECTOMY  07/02/2012   Procedure: PARATHYROIDECTOMY;  Surgeon: Earnstine Regal, MD;  Location: [facility];  Service: General;  Laterality: N/A;  . TONSILLECTOMY     as a child     Family History  Problem Relation Age of Onset  . Cancer Mother        pancreatic    Social History   Social History Narrative   Widow,husband died 2019-08-21 after 34 years marriage.Retired.  Social History   Tobacco Use  . Smoking status: Former Smoker    Packs/day: 1.00    Years: 40.00    Pack years: 40.00    Quit date: 07/19/2012    Years since quitting: 8.0  . Smokeless tobacco: Never Used  . Tobacco comment: quit smoking in Oct 2013  Substance Use Topics  . Alcohol use: No    Alcohol/week: 0.0 standard drinks    Current Meds  Medication Sig  . albuterol (VENTOLIN HFA) 108 (90 Base) MCG/ACT inhaler Inhale 1-2 puffs into the lungs every 6 (six) hours as needed for wheezing or shortness of breath.  Marland Kitchen amLODipine (NORVASC) 5 MG tablet TAKE ONE TABLET BY MOUTH DAILY  . estradiol (ESTRACE) 1 MG tablet TAKE ONE TABLET BY MOUTH ONCE DAILY.  . metoprolol tartrate (LOPRESSOR) 50  MG tablet TAKE 1 TABLET BY MOUTH TWICE DAILY.  . NP THYROID 90 MG tablet TAKE 1 TABLET BY MOUTH ONCE DAILY    **NEW DOSE**      Depression screen [facility] 2/9 08/21/2020 01/17/2020 07/01/2018 02/25/2018 08/26/2017  Decreased Interest 0 0 0 0 0  Down, Depressed, Hopeless 0 0 0 0 0  PHQ - 2 Score 0 0 0 0 0  Altered sleeping 0 - - - -  Tired, decreased energy 0 - - - -  Change in appetite 0 - - - -  Feeling bad or failure about yourself  0 - - - -  Trouble concentrating 0 - - - -  Moving slowly or fidgety/restless 0 - - - -  Suicidal thoughts 0 - - - -  PHQ-9 Score 0 - - - -  Difficult doing work/chores Not difficult at all - - - -     Objective:   Today's Vitals: BP 116/62   Pulse 72   Temp (!) 97.1 F (36.2 C) (Temporal)   Ht 5\' 6"  (1.676 m)   Wt 139 lb 9.6 oz (63.3 kg)   SpO2 97%   BMI 22.53 kg/m  Vitals with BMI 08/21/2020 04/17/2020 03/22/2020  Height 5\' 6"  5\' 6"  5\' 6"   Weight 139 lbs 10 oz 145 lbs 143 lbs 10 oz  BMI 22.54 62.22 97.98  Systolic 921 194 174  Diastolic 62 72 60  Pulse 72 69 70     Physical Exam   She looks systemically well.  She has lost 6 pounds since her last visit.  Blood pressure is excellent.  She is alert and orientated without any focal neurological signs.    Assessment   1. Benign hypertension   2. Diabetes mellitus without complication ([place])   3. Primary hypothyroidism   4. Vitamin D deficiency disease   5. Mixed hyperlipidemia   6. Primary ovarian failure       Tests ordered Orders Placed This Encounter  Procedures  . COMPLETE METABOLIC PANEL WITH GFR  . Hemoglobin A1c     Plan: 1. Blood work is ordered. 2. She will continue with amlodipine and metoprolol for hypertension which is continuing to keep her blood pressure under good control. 3. We will see what the A1c shows but I do not think she needs any medications for diabetes at this point. 4. She will continue with desiccated NP thyroid for her hypothyroidism. 5. She will  continue with estradiol.  She did not tolerate progesterone in the past unfortunately. 6. She will continue with vitamin D3 supplementation for vitamin D deficiency. 7. I will see her in 4 months time for follow-up or  earlier should the need arise.   No orders of the defined types were placed in this encounter.   Doree Albee, MD

## 2020-08-22 LAB — COMPLETE METABOLIC PANEL WITH GFR
AG Ratio: 2 (calc) (ref 1.0–2.5)
ALT: 13 U/L (ref 6–29)
AST: 13 U/L (ref 10–35)
Albumin: 4.2 g/dL (ref 3.6–5.1)
Alkaline phosphatase (APISO): 62 U/L (ref 37–153)
BUN: 20 mg/dL (ref 7–25)
CO2: 23 mmol/L (ref 20–32)
Calcium: 8.6 mg/dL (ref 8.6–10.4)
Chloride: 106 mmol/L (ref 98–110)
Creat: 0.93 mg/dL (ref 0.60–0.93)
GFR, Est African American: 72 mL/min/{1.73_m2} (ref >=60)
GFR, Est Non African American: 62 mL/min/{1.73_m2} (ref >=60)
Globulin: 2.1 g/dL (calc) (ref 1.9–3.7)
Glucose, Bld: 114 mg/dL — ABNORMAL HIGH (ref 65–99)
Potassium: 4.8 mmol/L (ref 3.5–5.3)
Sodium: 139 mmol/L (ref 135–146)
Total Bilirubin: 0.4 mg/dL (ref 0.2–1.2)
Total Protein: 6.3 g/dL (ref 6.1–8.1)

## 2020-08-22 LAB — HEMOGLOBIN A1C
Hgb A1c MFr Bld: 5.6 % of total Hgb (ref <5.7)
Mean Plasma Glucose: 114 (calc)
eAG (mmol/L): 6.3 (calc)

## 2020-08-30 DIAGNOSIS — Z23 Encounter for immunization: Secondary | ICD-10-CM | POA: Diagnosis not present

## 2020-09-18 ENCOUNTER — Telehealth (INDEPENDENT_AMBULATORY_CARE_PROVIDER_SITE_OTHER): Payer: Self-pay

## 2020-09-18 NOTE — Telephone Encounter (Signed)
Patient called and left a voice message and stated that she is taking the NP Thyroid 90 mg and she stated that this is the most expensive medication that she takes and she is wondering if she has to stay on this medication or if there was an option for a cheaper medication and patient would like to know what the best option is for her.  Please advise.

## 2020-09-18 NOTE — Telephone Encounter (Signed)
Please let the patient know that, as far as thyroid medications are concerned, I would recommend this particular thyroid medication over other thyroid medications which may be cheaper and may be covered by her insurance.  The other medications only contain T4 and NP thyroid contains both T4 and T3 which is what I think is really helping her feel better.  She may want to investigate other pharmacies using the good Rx coupon and it may be cheaper at other pharmacies compared to the one that she is using now.  If she finds that this NP thyroid 90 mg tablet is too expensive for her, and she wants to switch to (regular) thyroid, I will prescribe that.

## 2020-09-19 NOTE — Telephone Encounter (Signed)
Called patient and gave her the message from Dr. Anastasio Champion. Patient stated that she will stay on the NP Thyroid and will call around and see who has it for a better price. Patient will call to let us know when it is time for a refill and which pharmacy she wants to use. Patient verbalized an understanding.

## 2020-09-21 ENCOUNTER — Other Ambulatory Visit (INDEPENDENT_AMBULATORY_CARE_PROVIDER_SITE_OTHER): Payer: Self-pay

## 2020-09-21 ENCOUNTER — Telehealth (INDEPENDENT_AMBULATORY_CARE_PROVIDER_SITE_OTHER): Payer: Self-pay

## 2020-09-21 DIAGNOSIS — E039 Hypothyroidism, unspecified: Secondary | ICD-10-CM

## 2020-09-21 MED ORDER — NP THYROID 90 MG PO TABS: 90.0000 mg | ORAL_TABLET | Freq: Every day | ORAL | 0 refills | Status: DC

## 2020-09-21 NOTE — Addendum Note (Signed)
Addended by: Jeralyn Ruths E on: 09/21/2020 11:25 AM   Modules accepted: Orders

## 2020-09-21 NOTE — Telephone Encounter (Signed)
Patient called and she wants to change her pharmacy and wants to have her medications changed to [facility] in [place].  Please refill the following medication:  NP THYROID 90 MG tablet  Last filled on 04/17/2020, # 90 with 1 refill  Patient pays for this one out of pocket and wants to use the new pharmacy now. Please send in a refill. Thank you!

## 2020-09-21 NOTE — Telephone Encounter (Signed)
Can you send to the [facility] in [place]?  Thank you!

## 2020-09-21 NOTE — Telephone Encounter (Signed)
I apologize, now I sent it to [facility].

## 2020-09-21 NOTE — Telephone Encounter (Signed)
Refill sent to requested pharmacy.

## 2020-10-05 ENCOUNTER — Other Ambulatory Visit (INDEPENDENT_AMBULATORY_CARE_PROVIDER_SITE_OTHER): Payer: Self-pay | Admitting: Internal Medicine

## 2020-11-28 ENCOUNTER — Other Ambulatory Visit (INDEPENDENT_AMBULATORY_CARE_PROVIDER_SITE_OTHER): Payer: Self-pay | Admitting: Internal Medicine

## 2020-12-25 ENCOUNTER — Ambulatory Visit (INDEPENDENT_AMBULATORY_CARE_PROVIDER_SITE_OTHER): Payer: Medicare Other | Admitting: Internal Medicine

## 2020-12-25 ENCOUNTER — Other Ambulatory Visit: Payer: Self-pay

## 2020-12-25 ENCOUNTER — Encounter (INDEPENDENT_AMBULATORY_CARE_PROVIDER_SITE_OTHER): Payer: Self-pay | Admitting: Internal Medicine

## 2020-12-25 ENCOUNTER — Encounter (INDEPENDENT_AMBULATORY_CARE_PROVIDER_SITE_OTHER): Payer: Self-pay | Admitting: *Deleted

## 2020-12-25 VITALS — BP 121/64 | HR 64 | Temp 97.7°F | Resp 18 | Ht 66.0 in | Wt 145.2 lb

## 2020-12-25 DIAGNOSIS — E039 Hypothyroidism, unspecified: Secondary | ICD-10-CM | POA: Diagnosis not present

## 2020-12-25 DIAGNOSIS — I10 Essential (primary) hypertension: Secondary | ICD-10-CM | POA: Diagnosis not present

## 2020-12-25 DIAGNOSIS — E2839 Other primary ovarian failure: Secondary | ICD-10-CM | POA: Diagnosis not present

## 2020-12-25 DIAGNOSIS — R072 Precordial pain: Secondary | ICD-10-CM

## 2020-12-25 DIAGNOSIS — Z1211 Encounter for screening for malignant neoplasm of colon: Secondary | ICD-10-CM

## 2020-12-25 DIAGNOSIS — E782 Mixed hyperlipidemia: Secondary | ICD-10-CM

## 2020-12-25 DIAGNOSIS — E119 Type 2 diabetes mellitus without complications: Secondary | ICD-10-CM | POA: Diagnosis not present

## 2020-12-25 DIAGNOSIS — E559 Vitamin D deficiency, unspecified: Secondary | ICD-10-CM | POA: Diagnosis not present

## 2020-12-25 MED ORDER — AMLODIPINE BESYLATE 5 MG PO TABS: 5.0000 mg | ORAL_TABLET | Freq: Every day | ORAL | 1 refills | Status: DC

## 2020-12-25 MED ORDER — METOPROLOL TARTRATE 50 MG PO TABS: 50.0000 mg | ORAL_TABLET | Freq: Every day | ORAL | 1 refills | Status: DC

## 2020-12-25 MED ORDER — NP THYROID 90 MG PO TABS: 90.0000 mg | ORAL_TABLET | Freq: Every day | ORAL | 1 refills | Status: DC

## 2020-12-25 MED ORDER — ESTRADIOL 1 MG PO TABS: 1.0000 mg | ORAL_TABLET | Freq: Every day | ORAL | 1 refills | Status: DC

## 2020-12-25 NOTE — Progress Notes (Signed)
Metrics: Intervention Frequency ACO  Documented Smoking Status Yearly  Screened one or more times in 24 months  Cessation Counseling or  Active cessation medication Past 24 months  Past 24 months   Guideline developer: UpToDate (See UpToDate for funding source) Date Released: 2014       Wellness Office Visit  Subjective:  Patient ID: [name], female    DOB: Nov 30, 1950  Age: 70 y.o. MRN: [id number]  CC: This lady comes in for follow-up of hypertension, hypothyroidism, diabetes, menopausal symptoms. HPI  Recently, she has had 2 episodes of chest discomfort radiating to the left neck when going up stairs.  The pain lasted only for the time that she was going up the stairs and resolve fairly quickly but it was a chest tightness by her description. Her diabetes is extremely well controlled with a hemoglobin A1c of 5.6% previously and she is on no medication for this now. She continues on metoprolol and amlodipine for hypertension. She continues on NP thyroid for her hypothyroidism. She continues on estradiol for menopausal symptoms and by identical hormone therapy.  She has not been ever able to tolerate progesterone. Past Medical History:  Diagnosis Date  . Arthritis   . Asthma    takes Singulair nightly  . Diabetes mellitus    takes levemir daily and Metformin daily  . Generalized headaches   . GERD (gastroesophageal reflux disease)    takes Omeprazole daily  . Herniated disc, cervical   . History of kidney stones   . Hyperlipidemia    takes Welchol daily  . Hypertension    takes Enalapril dialy  . Hypothyroidism    takes Synthroid daily  . IBS (irritable bowel syndrome)   . Joint pain    hands and neck  . Nocturia   . PONV (postoperative nausea and vomiting)   . Seizures ([place])    denies  . TIA (transient ischemic attack)    diagnosed by EEG but never knew she had it  . Trigger finger   . Weakness   . Weakness    in both hands   Past Surgical History:   Procedure Laterality Date  . ABDOMINAL HYSTERECTOMY  1980  . ANTERIOR CERVICAL DECOMP/DISCECTOMY FUSION N/A 10/18/2013   Procedure: Cervical three-four, Cervical four-five, Cervical seven-Thoracic one anterior cervical decompression with fusion plating and bonegraft;  Surgeon: Hosie Spangle, MD;  Location: [facility];  Service: Neurosurgery;  Laterality: N/A;  Cervical three-four, Cervical four-five, Cervical seven-Thoracic one anterior cervical decompression with fusion plating and bonegraft  . APPENDECTOMY     done with hysterectomy  . BACK SURGERY  2007   lumbar fusion  . carpel tunnel Bilateral    left first then right  . cataract implants    . COLONOSCOPY    . ESOPHAGOGASTRODUODENOSCOPY     hx of h pylori  . EYE SURGERY  11/18/2008   right - cataracts  . EYE SURGERY  06/01/2009   left - cataracts  . herniated disc   10/2000, 08/31/2008   C5-C6 then C6-C7  . knee anthroscopy bilateral Bilateral   . KNEE ARTHROSCOPY    . LITHOTRIPSY  11/12/2007   right side  . Parathyroid surgery     October 2013  . PARATHYROIDECTOMY  07/02/2012   Procedure: PARATHYROIDECTOMY;  Surgeon: Earnstine Regal, MD;  Location: [facility];  Service: General;  Laterality: N/A;  . TONSILLECTOMY     as a child     Family History  Problem Relation Age  of Onset  . Cancer Mother        pancreatic    Social History   Social History Narrative   Widow,husband died 08/08/2019 after 83 years marriage.Retired.   Social History   Tobacco Use  . Smoking status: Former Smoker    Packs/day: 1.00    Years: 40.00    Pack years: 40.00    Quit date: 2012-08-07    Years since quitting: 8.4  . Smokeless tobacco: Never Used  . Tobacco comment: quit smoking in 08-07-12  Substance Use Topics  . Alcohol use: No    Alcohol/week: 0.0 standard drinks    Current Meds  Medication Sig  . albuterol (VENTOLIN HFA) 108 (90 Base) MCG/ACT inhaler Inhale 1-2 puffs into the lungs every 6 (six) hours as needed for  wheezing or shortness of breath.  . [DISCONTINUED] amLODipine (NORVASC) 5 MG tablet TAKE ONE TABLET BY MOUTH DAILY  . [DISCONTINUED] estradiol (ESTRACE) 1 MG tablet TAKE ONE TABLET BY MOUTH ONCE DAILY.  . [DISCONTINUED] metoprolol tartrate (LOPRESSOR) 50 MG tablet TAKE 1 TABLET BY MOUTH TWICE DAILY.  . [DISCONTINUED] NP THYROID 90 MG tablet Take 1 tablet (90 mg total) by mouth daily.     [place] Office Visit from 08/21/2020 in [facility]  PHQ-9 Total Score 0      Objective:   Today's Vitals: BP 121/64 (BP Location: Left Arm, Patient Position: Sitting, Cuff Size: Normal)   Pulse 64   Temp 97.7 F (36.5 C) (Temporal)   Resp 18   Ht 5\' 6"  (1.676 m)   Wt 145 lb 3.2 oz (65.9 kg)   SpO2 94%   BMI 23.44 kg/m  Vitals with BMI 12/25/2020 08/21/2020 04/17/2020  Height 5\' 6"  5\' 6"  5\' 6"   Weight 145 lbs 3 oz 139 lbs 10 oz 145 lbs  BMI 23.45 74.12 87.86  Systolic 767 209 470  Diastolic 64 62 72  Pulse 64 72 69     Physical Exam  He is not in pain at the present time.  She has gained about 6 pounds since last visit.  Blood pressure is excellent.  Heart sounds are present without gallop rhythm.  There are no carotid bruits.  Lung fields are clear.     Assessment   1. Benign hypertension   2. Primary hypothyroidism   3. Diabetes mellitus without complication ([place])   4. Vitamin D deficiency disease   5. Primary ovarian failure   6. Mixed hyperlipidemia   7. Colon cancer screening   8. Precordial pain       Tests ordered Orders Placed This Encounter  Procedures  . Lipid panel  . Hemoglobin A1c  . COMPLETE METABOLIC PANEL WITH GFR  . Ambulatory referral to Gastroenterology  . Ambulatory referral to Cardiology     Plan: 1. I am concerned about her cardiac sounding chest pain.  I will refer her for an urgent appointment cardiology. 2. Continue with estradiol as before. 3. Continue with NP thyroid as before. 4. Continue with amlodipine and metoprolol  for hypertension which is keeping her blood pressure under good control. 5. Blood work is ordered. 6. I will send her to gastroenterology for screening colonoscopy as she is overdue. 7. Further recommendations will depend on all these results and she will follow up with Judson Roch for an annual Medicare wellness visit in about 2 months and I will see her in August for follow-up.   Meds ordered this encounter  Medications  . metoprolol  tartrate (LOPRESSOR) 50 MG tablet    Sig: Take 1 tablet (50 mg total) by mouth daily at 12 noon.    Dispense:  90 tablet    Refill:  1  . amLODipine (NORVASC) 5 MG tablet    Sig: Take 1 tablet (5 mg total) by mouth daily.    Dispense:  90 tablet    Refill:  1  . estradiol (ESTRACE) 1 MG tablet    Sig: Take 1 tablet (1 mg total) by mouth daily.    Dispense:  90 tablet    Refill:  1  . NP THYROID 90 MG tablet    Sig: Take 1 tablet (90 mg total) by mouth daily.    Dispense:  90 tablet    Refill:  1    Dasja Brase Luther Parody, MD

## 2020-12-26 LAB — LIPID PANEL
Cholesterol: 150 mg/dL (ref <200)
HDL: 38 mg/dL — ABNORMAL LOW (ref >=50)
LDL Cholesterol (Calc): 85 mg/dL (calc)
Non-HDL Cholesterol (Calc): 112 mg/dL (calc) (ref <130)
Total CHOL/HDL Ratio: 3.9 (calc) (ref <5.0)
Triglycerides: 177 mg/dL — ABNORMAL HIGH (ref <150)

## 2020-12-26 LAB — COMPLETE METABOLIC PANEL WITH GFR
AG Ratio: 2.5 (calc) (ref 1.0–2.5)
ALT: 19 U/L (ref 6–29)
AST: 17 U/L (ref 10–35)
Albumin: 4.2 g/dL (ref 3.6–5.1)
Alkaline phosphatase (APISO): 50 U/L (ref 37–153)
BUN: 13 mg/dL (ref 7–25)
CO2: 28 mmol/L (ref 20–32)
Calcium: 8.7 mg/dL (ref 8.6–10.4)
Chloride: 105 mmol/L (ref 98–110)
Creat: 0.81 mg/dL (ref 0.60–0.93)
GFR, Est African American: 85 mL/min/{1.73_m2} (ref >=60)
GFR, Est Non African American: 73 mL/min/{1.73_m2} (ref >=60)
Globulin: 1.7 g/dL (calc) — ABNORMAL LOW (ref 1.9–3.7)
Glucose, Bld: 133 mg/dL — ABNORMAL HIGH (ref 65–99)
Potassium: 4.9 mmol/L (ref 3.5–5.3)
Sodium: 140 mmol/L (ref 135–146)
Total Bilirubin: 0.5 mg/dL (ref 0.2–1.2)
Total Protein: 5.9 g/dL — ABNORMAL LOW (ref 6.1–8.1)

## 2020-12-26 LAB — HEMOGLOBIN A1C
Hgb A1c MFr Bld: 5.9 % of total Hgb — ABNORMAL HIGH (ref <5.7)
Mean Plasma Glucose: 123 mg/dL
eAG (mmol/L): 6.8 mmol/L

## 2020-12-28 ENCOUNTER — Other Ambulatory Visit: Payer: Self-pay

## 2020-12-28 ENCOUNTER — Encounter: Payer: Self-pay | Admitting: Cardiology

## 2020-12-28 ENCOUNTER — Ambulatory Visit (INDEPENDENT_AMBULATORY_CARE_PROVIDER_SITE_OTHER): Payer: Medicare Other | Admitting: Cardiology

## 2020-12-28 ENCOUNTER — Encounter: Payer: Self-pay | Admitting: *Deleted

## 2020-12-28 ENCOUNTER — Telehealth: Payer: Self-pay | Admitting: Cardiology

## 2020-12-28 VITALS — BP 124/60 | HR 74 | Ht 66.0 in | Wt 145.6 lb

## 2020-12-28 DIAGNOSIS — I10 Essential (primary) hypertension: Secondary | ICD-10-CM | POA: Diagnosis not present

## 2020-12-28 DIAGNOSIS — R079 Chest pain, unspecified: Secondary | ICD-10-CM | POA: Diagnosis not present

## 2020-12-28 NOTE — Telephone Encounter (Signed)
Pre-cert Verification for the following procedure    EXERCISE NUCLEAR STRESS TEST   DATE:01/08/2021  LOCATION:[facility]

## 2020-12-28 NOTE — Patient Instructions (Signed)
Your physician recommends that you schedule a follow-up appointment in: PENDING WITH DR [facility]  Your physician recommends that you continue on your current medications as directed. Please refer to the Current Medication list given to you today.  Your physician has requested that you have en exercise stress myoview. For further information please visit [email or web address]. Please follow instruction sheet, as given.  Thank you for choosing [facility]!!

## 2020-12-28 NOTE — Progress Notes (Signed)
Clinical Summary [name] is a 70 y.o.female former patient of Dr Bronson Ing, this is our first visit together.   1. Chest pain  - prior GI evaluation, stopped bc powder at the time and symptoms improved  11/2015 nuclear stress: abnormal EKG with normal perufsion findings.  11/2015 echo LVEF 65-70%, no WMAs, grade I dd  - pressure left sided and midchest, can go into left shoulder. 3/10 in severity usually. Can occur at rest or with activity. Occasional positional component. Lasts a few minutes. Occurs about once week. - DOE with walking up 1 flight of stairs into her apartment which is new - SOB with dancing, which she does regularly  CAD risk factors: HTN, prior history of DM2, strong family history of heart disease    Past Medical History:  Diagnosis Date  . Arthritis   . Asthma    takes Singulair nightly  . Diabetes mellitus    takes levemir daily and Metformin daily  . Generalized headaches   . GERD (gastroesophageal reflux disease)    takes Omeprazole daily  . Herniated disc, cervical   . History of kidney stones   . Hyperlipidemia    takes Welchol daily  . Hypertension    takes Enalapril dialy  . Hypothyroidism    takes Synthroid daily  . IBS (irritable bowel syndrome)   . Joint pain    hands and neck  . Nocturia   . PONV (postoperative nausea and vomiting)   . Seizures ([place])    denies  . TIA (transient ischemic attack)    diagnosed by EEG but never knew she had it  . Trigger finger   . Weakness   . Weakness    in both hands     Allergies  Allergen Reactions  . Advair Diskus [Fluticasone-Salmeterol] Anaphylaxis  . Codeine Nausea And Vomiting  . Hydrocodone-Acetaminophen Nausea And Vomiting  . Oxycodone-Acetaminophen Nausea And Vomiting  . Tetracyclines & Related Itching    Internal     Current Outpatient Medications  Medication Sig Dispense Refill  . albuterol (VENTOLIN HFA) 108 (90 Base) MCG/ACT inhaler Inhale 1-2 puffs into the lungs  every 6 (six) hours as needed for wheezing or shortness of breath. 6.7 g 0  . amLODipine (NORVASC) 5 MG tablet Take 1 tablet (5 mg total) by mouth daily. 90 tablet 1  . estradiol (ESTRACE) 1 MG tablet Take 1 tablet (1 mg total) by mouth daily. 90 tablet 1  . metoprolol tartrate (LOPRESSOR) 50 MG tablet Take 1 tablet (50 mg total) by mouth daily at 12 noon. 90 tablet 1  . NP THYROID 90 MG tablet Take 1 tablet (90 mg total) by mouth daily. 90 tablet 1   No current facility-administered medications for this visit.     Past Surgical History:  Procedure Laterality Date  . ABDOMINAL HYSTERECTOMY  1980  . ANTERIOR CERVICAL DECOMP/DISCECTOMY FUSION N/A 10/18/2013   Procedure: Cervical three-four, Cervical four-five, Cervical seven-Thoracic one anterior cervical decompression with fusion plating and bonegraft;  Surgeon: Hosie Spangle, MD;  Location: [facility];  Service: Neurosurgery;  Laterality: N/A;  Cervical three-four, Cervical four-five, Cervical seven-Thoracic one anterior cervical decompression with fusion plating and bonegraft  . APPENDECTOMY     done with hysterectomy  . BACK SURGERY  2007   lumbar fusion  . carpel tunnel Bilateral    left first then right  . cataract implants    . COLONOSCOPY    . ESOPHAGOGASTRODUODENOSCOPY     hx  of h pylori  . EYE SURGERY  11/18/2008   right - cataracts  . EYE SURGERY  06/01/2009   left - cataracts  . herniated disc   10/2000, 08/31/2008   C5-C6 then C6-C7  . knee anthroscopy bilateral Bilateral   . KNEE ARTHROSCOPY    . LITHOTRIPSY  11/12/2007   right side  . Parathyroid surgery     October 2013  . PARATHYROIDECTOMY  07/02/2012   Procedure: PARATHYROIDECTOMY;  Surgeon: Earnstine Regal, MD;  Location: [facility];  Service: General;  Laterality: N/A;  . TONSILLECTOMY     as a child     Allergies  Allergen Reactions  . Advair Diskus [Fluticasone-Salmeterol] Anaphylaxis  . Codeine Nausea And Vomiting  . Hydrocodone-Acetaminophen Nausea And  Vomiting  . Oxycodone-Acetaminophen Nausea And Vomiting  . Tetracyclines & Related Itching    Internal      Family History  Problem Relation Age of Onset  . Cancer Mother        pancreatic     Social History [name] reports that she quit smoking about 8 years ago. She has a 40.00 pack-year smoking history. She has never used smokeless tobacco. [name] reports no history of alcohol use.   Review of Systems CONSTITUTIONAL: No weight loss, fever, chills, weakness or fatigue.  HEENT: Eyes: No visual loss, blurred vision, double vision or yellow sclerae.No hearing loss, sneezing, congestion, runny nose or sore throat.  SKIN: No rash or itching.  CARDIOVASCULAR: per hpi RESPIRATORY: No shortness of breath, cough or sputum.  GASTROINTESTINAL: No anorexia, nausea, vomiting or diarrhea. No abdominal pain or blood.  GENITOURINARY: No burning on urination, no polyuria NEUROLOGICAL: No headache, dizziness, syncope, paralysis, ataxia, numbness or tingling in the extremities. No change in bowel or bladder control.  MUSCULOSKELETAL: No muscle, back pain, joint pain or stiffness.  LYMPHATICS: No enlarged nodes. No history of splenectomy.  PSYCHIATRIC: No history of depression or anxiety.  ENDOCRINOLOGIC: No reports of sweating, cold or heat intolerance. No polyuria or polydipsia.  Marland Kitchen   Physical Examination Today's Vitals   12/28/20 0917  BP: 124/60  Pulse: 74  SpO2: 98%  Weight: 145 lb 9.6 oz (66 kg)  Height: 5\' 6"  (1.676 m)   Body mass index is 23.5 kg/m.  Gen: resting comfortably, no acute distress HEENT: no scleral icterus, pupils equal round and reactive, no palptable cervical adenopathy,  CV: RRR, no m/r/g, no jvd Resp: Clear to auscultation bilaterally GI: abdomen is soft, non-tender, non-distended, normal bowel sounds, no hepatosplenomegaly MSK: extremities are warm, no edema.  Skin: warm, no rash Neuro:  no focal deficits Psych: appropriate  affect    Assessment and Plan  1. Chest pain - unclear etiology, multiple CAD risk factors - will plan for exercise nuclear stress test to further evaluate. 2017 exercise stress abnormal EKG but normal perfusion  EKG today  SR, no acute ischemic changes      Arnoldo Lenis, M.D.

## 2021-01-05 ENCOUNTER — Other Ambulatory Visit (HOSPITAL_COMMUNITY)
Admission: RE | Admit: 2021-01-05 | Discharge: 2021-01-05 | Disposition: A | Discharge status: Home / Self Care | Payer: Medicare Other | Source: Ambulatory Visit | Attending: Cardiology | Admitting: Cardiology

## 2021-01-05 ENCOUNTER — Other Ambulatory Visit: Payer: Self-pay

## 2021-01-05 DIAGNOSIS — Z20822 Contact with and (suspected) exposure to covid-19: Secondary | ICD-10-CM | POA: Diagnosis not present

## 2021-01-05 DIAGNOSIS — Z01812 Encounter for preprocedural laboratory examination: Secondary | ICD-10-CM | POA: Diagnosis not present

## 2021-01-06 LAB — SARS CORONAVIRUS 2 (TAT 6-24 HRS): SARS Coronavirus 2: NEGATIVE

## 2021-01-08 ENCOUNTER — Ambulatory Visit (HOSPITAL_COMMUNITY)
Admission: RE | Admit: 2021-01-08 | Discharge: 2021-01-08 | Disposition: A | Discharge status: Home / Self Care | Payer: Medicare Other | Source: Ambulatory Visit | Attending: Cardiology | Admitting: Cardiology

## 2021-01-08 ENCOUNTER — Encounter (HOSPITAL_COMMUNITY)
Admission: RE | Admit: 2021-01-08 | Discharge: 2021-01-08 | Disposition: A | Discharge status: Home / Self Care | Payer: Medicare Other | Source: Ambulatory Visit | Attending: Cardiology | Admitting: Cardiology

## 2021-01-08 ENCOUNTER — Encounter (HOSPITAL_COMMUNITY): Payer: Self-pay

## 2021-01-08 DIAGNOSIS — R079 Chest pain, unspecified: Secondary | ICD-10-CM | POA: Diagnosis not present

## 2021-01-08 LAB — NM MYOCAR MULTI W/SPECT W/WALL MOTION / EF
Estimated workload: 7 METS
Exercise duration (min): 7 min
Exercise duration (sec): 3 s
LV dias vol: 50 mL (ref 46–106)
LV sys vol: 6 mL
MPHR: 149 {beats}/min
Peak HR: 144 {beats}/min
Percent HR: 96 %
RATE: 0.43
RPE: 12
Rest HR: 72 {beats}/min
SDS: 1
SRS: 1
SSS: 2
TID: 0.94

## 2021-01-08 MED ORDER — REGADENOSON 0.4 MG/5ML IV SOLN
INTRAVENOUS | Status: AC
  Filled 2021-01-08: qty 5

## 2021-01-08 MED ORDER — SODIUM CHLORIDE FLUSH 0.9 % IV SOLN
INTRAVENOUS | Status: AC
  Administered 2021-01-08: 10 mL via INTRAVENOUS
  Filled 2021-01-08: qty 10

## 2021-01-08 MED ORDER — TECHNETIUM TC 99M TETROFOSMIN IV KIT
30.0000 | PACK | Freq: Once | INTRAVENOUS | Status: AC | PRN
  Administered 2021-01-08: 30.3 via INTRAVENOUS

## 2021-01-08 MED ORDER — TECHNETIUM TC 99M TETROFOSMIN IV KIT
10.0000 | PACK | Freq: Once | INTRAVENOUS | Status: AC | PRN
  Administered 2021-01-08: 9.98 via INTRAVENOUS

## 2021-01-10 ENCOUNTER — Encounter (HOSPITAL_COMMUNITY): Payer: Medicare Other

## 2021-01-12 ENCOUNTER — Telehealth: Payer: Self-pay | Admitting: Cardiology

## 2021-01-12 NOTE — Telephone Encounter (Signed)
Normal stress test, no signs of any blockages. F/u with Jonni Sanger 4 months    Zandra Abts MD

## 2021-01-12 NOTE — Telephone Encounter (Signed)
Patient called requesting stress test results.

## 2021-01-15 ENCOUNTER — Telehealth: Payer: Self-pay | Admitting: *Deleted

## 2021-01-15 NOTE — Telephone Encounter (Signed)
Pt aware - appt cx

## 2021-01-15 NOTE — Telephone Encounter (Signed)
-----   Message from Arnoldo Lenis, MD sent at 01/15/2021  4:08 PM EDT ----- That is fine   J BrancH MD ----- Message ----- From: Massie Maroon, CMA Sent: 01/12/2021   4:29 PM EDT To: Arnoldo Lenis, MD  This pt wanted to know if she could f/u as needed instead of 4 months since she had a normal stress test.  Hardie Pulley

## 2021-02-28 ENCOUNTER — Ambulatory Visit (INDEPENDENT_AMBULATORY_CARE_PROVIDER_SITE_OTHER): Payer: Medicare Other | Admitting: Nurse Practitioner

## 2021-02-28 ENCOUNTER — Telehealth (INDEPENDENT_AMBULATORY_CARE_PROVIDER_SITE_OTHER): Payer: Self-pay | Admitting: Nurse Practitioner

## 2021-02-28 ENCOUNTER — Other Ambulatory Visit: Payer: Self-pay

## 2021-02-28 ENCOUNTER — Encounter (INDEPENDENT_AMBULATORY_CARE_PROVIDER_SITE_OTHER): Payer: Self-pay | Admitting: Nurse Practitioner

## 2021-02-28 VITALS — BP 128/68 | HR 74 | Temp 97.3°F | Ht 65.5 in | Wt 146.8 lb

## 2021-02-28 DIAGNOSIS — Z1159 Encounter for screening for other viral diseases: Secondary | ICD-10-CM | POA: Diagnosis not present

## 2021-02-28 DIAGNOSIS — Z0000 Encounter for general adult medical examination without abnormal findings: Secondary | ICD-10-CM

## 2021-02-28 DIAGNOSIS — Z1211 Encounter for screening for malignant neoplasm of colon: Secondary | ICD-10-CM | POA: Diagnosis not present

## 2021-02-28 DIAGNOSIS — Z1231 Encounter for screening mammogram for malignant neoplasm of breast: Secondary | ICD-10-CM | POA: Diagnosis not present

## 2021-02-28 DIAGNOSIS — Z23 Encounter for immunization: Secondary | ICD-10-CM | POA: Diagnosis not present

## 2021-02-28 NOTE — Patient Instructions (Signed)
  Ms. Nobis , Thank you for taking time to come for your Medicare Wellness Visit. I appreciate your ongoing commitment to your health goals. Please review the following plan we discussed and let me know if I can assist you in the future.   These are the goals we discussed: Goals   None     This is a list of the screening recommended for you and due dates:  Health Maintenance  Topic Date Due  . Complete foot exam   Never done  . Hepatitis C Screening: USPSTF Recommendation to screen - Ages 36-79 yo.  Never done  . Cologuard (Stool DNA test)  Never done  . Urine Protein Check  02/19/2019  . Eye exam for diabetics  06/29/2019  . Zoster (Shingles) Vaccine (1 of 2) 05/31/2021*  . Pneumonia vaccines (1 of 2 - PCV13) 02/28/2022*  . Flu Shot  04/30/2021  . Hemoglobin A1C  06/27/2021  . Mammogram  03/27/2022  . Tetanus Vaccine  07/12/2024  . DEXA scan (bone density measurement)  Completed  . COVID-19 Vaccine  Completed  . HPV Vaccine  Aged Out  *Topic was postponed. The date shown is not the original due date.

## 2021-02-28 NOTE — Progress Notes (Signed)
Subjective:   [name] is a 70 y.o. female who presents for Medicare Annual (Subsequent) preventive examination.  Review of Systems     Cardiac Risk Factors include: advanced age (>45men, >55 women);hypertension     Objective:    Today's Vitals   02/28/21 0955  BP: 128/68  Pulse: 74  Temp: (!) 97.3 F (36.3 C)  TempSrc: Temporal  SpO2: 97%  Weight: 146 lb 12.8 oz (66.6 kg)  Height: 5' 5.5" (1.664 m)   Body mass index is 24.06 kg/m.  Advanced Directives 03/05/2020 04/05/2016 07/12/2014 10/18/2013 10/13/2013 07/02/2012 06/29/2012  Does Patient Have a Medical Advance Directive? No No No Patient does not have advance directive Patient does not have advance directive;Patient would like information - Patient does not have advance directive;Patient would not like information  Would patient like information on creating a medical advance directive? - No - patient declined information - - Advance directive packet given - -  Pre-existing out of facility DNR order (yellow form or pink MOST form) - - - - No No No    Current Medications (verified) Outpatient Encounter Medications as of 02/28/2021  Medication Sig  . albuterol (VENTOLIN HFA) 108 (90 Base) MCG/ACT inhaler Inhale 1-2 puffs into the lungs every 6 (six) hours as needed for wheezing or shortness of breath.  Marland Kitchen amLODipine (NORVASC) 5 MG tablet Take 1 tablet (5 mg total) by mouth daily.  Marland Kitchen estradiol (ESTRACE) 1 MG tablet Take 1 tablet (1 mg total) by mouth daily.  . metoprolol tartrate (LOPRESSOR) 50 MG tablet Take 1 tablet (50 mg total) by mouth daily at 12 noon.  . NP THYROID 90 MG tablet Take 1 tablet (90 mg total) by mouth daily.   No facility-administered encounter medications on file as of 02/28/2021.    Allergies (verified) Advair diskus [fluticasone-salmeterol], Codeine, Hydrocodone-acetaminophen, Oxycodone-acetaminophen, and Tetracyclines & related   History: Past Medical History:  Diagnosis Date  . Arthritis   .  Asthma    takes Singulair nightly  . Diabetes mellitus    takes levemir daily and Metformin daily  . Generalized headaches   . GERD (gastroesophageal reflux disease)    takes Omeprazole daily  . Herniated disc, cervical   . History of kidney stones   . Hyperlipidemia    takes Welchol daily  . Hypertension    takes Enalapril dialy  . Hypothyroidism    takes Synthroid daily  . IBS (irritable bowel syndrome)   . Joint pain    hands and neck  . Nocturia   . PONV (postoperative nausea and vomiting)   . Seizures ([place])    denies  . TIA (transient ischemic attack)    diagnosed by EEG but never knew she had it  . Trigger finger   . Weakness   . Weakness    in both hands   Past Surgical History:  Procedure Laterality Date  . ABDOMINAL HYSTERECTOMY  1980  . ANTERIOR CERVICAL DECOMP/DISCECTOMY FUSION N/A 10/18/2013   Procedure: Cervical three-four, Cervical four-five, Cervical seven-Thoracic one anterior cervical decompression with fusion plating and bonegraft;  Surgeon: Hosie Spangle, MD;  Location: [facility];  Service: Neurosurgery;  Laterality: N/A;  Cervical three-four, Cervical four-five, Cervical seven-Thoracic one anterior cervical decompression with fusion plating and bonegraft  . APPENDECTOMY     done with hysterectomy  . BACK SURGERY  2007   lumbar fusion  . carpel tunnel Bilateral    left first then right  . cataract implants    .  COLONOSCOPY    . ESOPHAGOGASTRODUODENOSCOPY     hx of h pylori  . EYE SURGERY  11/18/2008   right - cataracts  . EYE SURGERY  06/01/2009   left - cataracts  . herniated disc   10/2000, 08/31/2008   C5-C6 then C6-C7  . knee anthroscopy bilateral Bilateral   . KNEE ARTHROSCOPY    . LITHOTRIPSY  11/12/2007   right side  . Parathyroid surgery     2012-07-21  . PARATHYROIDECTOMY  07/02/2012   Procedure: PARATHYROIDECTOMY;  Surgeon: Earnstine Regal, MD;  Location: [facility];  Service: General;  Laterality: N/A;  . TONSILLECTOMY     as a child    Family History  Problem Relation Age of Onset  . Cancer Mother        pancreatic   Social History   Socioeconomic History  . Marital status: Married    Spouse name: Not on file  . Number of children: Not on file  . Years of education: Not on file  . Highest education level: Not on file  Occupational History  . Not on file  Tobacco Use  . Smoking status: Former Smoker    Packs/day: 1.00    Years: 40.00    Pack years: 40.00    Quit date: 07/19/2012    Years since quitting: 8.6  . Smokeless tobacco: Never Used  . Tobacco comment: quit smoking in 2012/07/21  Vaping Use  . Vaping Use: Never used  Substance and Sexual Activity  . Alcohol use: No    Alcohol/week: 0.0 standard drinks  . Drug use: No  . Sexual activity: Not on file  Other Topics Concern  . Not on file  Social History Narrative   Widow,husband died 07/22/2019 after 52 years marriage.Retired.   Social Determinants of Health   Financial Resource Strain: Not on file  Food Insecurity: Not on file  Transportation Needs: Not on file  Physical Activity: Not on file  Stress: Not on file  Social Connections: Not on file    Tobacco Counseling Counseling given: Yes Comment: quit smoking in 07/21/2012   Clinical Intake:  Pre-visit preparation completed: Yes  Pain : No/denies pain     BMI - recorded: 24.06 Nutritional Status: BMI of 19-24  Normal Nutritional Risks: None Diabetes: No  How often do you need to have someone help you when you read instructions, pamphlets, or other written materials from your doctor or pharmacy?: 1 - Never What is the last grade level you completed in school?: GED (completed 10th grade)  Diabetic? [place]  Interpreter Needed?: No  Information entered by :: Jeralyn Ruths, NP-C   Activities of Daily Living In your present state of health, do you have any difficulty performing the following activities: 02/28/2021 12/25/2020  Hearing? N N  Vision? Y N  Difficulty concentrating or  making decisions? N N  Walking or climbing stairs? N N  Dressing or bathing? N N  Doing errands, shopping? N N  Preparing Food and eating ? N -  Using the Toilet? N -  In the past six months, have you accidently leaked urine? N -  Do you have problems with loss of bowel control? N -  Managing your Medications? N -  Managing your Finances? N -  Housekeeping or managing your Housekeeping? N -  Some recent data might be hidden    Patient Care Team: Doree Albee, MD as PCP - General (Internal Medicine) Arnoldo Lenis, MD as  PCP - Cardiology (Cardiology)  Indicate any recent Medical Services you may have received from other than [facility] providers in the past year (date may be approximate).     Assessment:   This is a routine wellness examination for [place].  Hearing/Vision screen No exam data present  Dietary issues and exercise activities discussed: Current Exercise Habits: Structured exercise class, Type of exercise: Other - see comments (dancing), Time (Minutes): 60, Frequency (Times/Week): 2, Weekly Exercise (Minutes/Week): 120, Exercise limited by: None identified  Goals Addressed   None    Depression Screen PHQ 2/9 Scores 02/28/2021 08/21/2020 01/17/2020 07/01/2018 02/25/2018 08/26/2017 04/24/2017  PHQ - 2 Score 0 0 0 0 0 0 0  PHQ- 9 Score 0 0 - - - - -  Exception Documentation - - Medical reason - - - -    Fall Risk Fall Risk  02/28/2021 08/21/2020 01/17/2020 07/01/2018 02/25/2018  Falls in the past year? 0 0 0 No No  Number falls in past yr: - - 0 - -  Injury with Fall? - - 0 - -    FALL RISK PREVENTION PERTAINING TO THE HOME:  Any stairs in or around the home? Yes  If so, are there any without handrails? Yes  Home free of loose throw rugs in walkways, pet beds, electrical cords, etc? Yes  Adequate lighting in your home to reduce risk of falls? Yes   ASSISTIVE DEVICES UTILIZED TO PREVENT FALLS:  Life alert? No  Use of a cane, walker or w/c? Yes  - has them in  storage that she can use if needed Grab bars in the bathroom? No  Shower chair or bench in shower? No  Elevated toilet seat or a handicapped toilet? No   TIMED UP AND GO:  Was the test performed? Yes .  Length of time to ambulate 10 feet: 7 sec.   Gait steady and fast without use of assistive device  Cognitive Function:     6CIT Screen 02/28/2021  What Year? 0 points  What month? 0 points  What time? 0 points  Count back from 20 0 points  Months in reverse 0 points  Repeat phrase 0 points  Total Score 0    Immunizations Immunization History  Administered Date(s) Administered  . Fluad Quad(high Dose 65+) 08/17/2019  . Influenza, High Dose Seasonal PF 07/14/2020  . Moderna Sars-Covid-2 Vaccination 11/24/2019, 12/22/2019, 07/07/2020  . Tdap 07/12/2014    TDAP status: Up to date  Flu Vaccine status: Up to date  Pneumococcal vaccine status: Declined,  Education has been provided regarding the importance of this vaccine but patient still declined. Advised may receive this vaccine at local pharmacy or Health Dept. Aware to provide a copy of the vaccination record if obtained from local pharmacy or Health Dept. Verbalized acceptance and understanding.   Covid-19 vaccine status: Completed vaccines  Qualifies for Shingles Vaccine? No   Zostavax completed No   Shingrix Completed?: No.    Education has been provided regarding the importance of this vaccine. Patient has been advised to call insurance company to determine out of pocket expense if they have not yet received this vaccine. Advised may also receive vaccine at local pharmacy or Health Dept. Verbalized acceptance and understanding.  Screening Tests Health Maintenance  Topic Date Due  . FOOT EXAM  Never done  . Hepatitis C Screening  Never done  . Zoster Vaccines- Shingrix (1 of 2) Never done  . PNA vac Low Risk Adult (1 of 2 - PCV13)  Never done  . COLONOSCOPY (Pts 45-28yrs Insurance coverage will need to be confirmed)   12/30/2018  . URINE MICROALBUMIN  02/19/2019  . OPHTHALMOLOGY EXAM  06/29/2019  . INFLUENZA VACCINE  04/30/2021  . HEMOGLOBIN A1C  06/27/2021  . MAMMOGRAM  03/27/2022  . TETANUS/TDAP  07/12/2024  . DEXA SCAN  Completed  . COVID-19 Vaccine  Completed  . HPV VACCINES  Aged Out    Health Maintenance  Health Maintenance Due  Topic Date Due  . FOOT EXAM  Never done  . Hepatitis C Screening  Never done  . Zoster Vaccines- Shingrix (1 of 2) Never done  . PNA vac Low Risk Adult (1 of 2 - PCV13) Never done  . COLONOSCOPY (Pts 45-47yrs Insurance coverage will need to be confirmed)  12/30/2018  . URINE MICROALBUMIN  02/19/2019  . OPHTHALMOLOGY EXAM  06/29/2019    Colorectal cancer screening: Type of screening: Colonoscopy. Completed 12/2008. Repeat every 10 years  Mammogram status: Completed 02/2020. Repeat every year  Bone Density status: Completed 2018. Results reflect: Bone density results: NORMAL. Repeat every 2 years.  Lung Cancer Screening: (Low Dose CT Chest recommended if Age 49-80 years, 30 pack-year currently smoking OR have quit w/in 15years.) does not qualify.  Pack Year: 20 (40 years x 0.5 PPD)  Lung Cancer Screening Referral: no  Additional Screening:  Hepatitis C Screening: does qualify; Completed today  Vision Screening: Recommended annual ophthalmology exams for early detection of glaucoma and other disorders of the eye. Is the patient up to date with their annual eye exam?  Yes  Who is the provider or what is the name of the office in which the patient attends annual eye exams? [facility] If pt is not established with a provider, would they like to be referred to a provider to establish care? No .   Dental Screening: Recommended annual dental exams for proper oral hygiene  Community Resource Referral / Chronic Care Management: CRR required this visit?  No   CCM required this visit?  No      Plan:   I did encourage shingles vaccination as well as pneumonia  vaccination.  She is considering getting her second COVID-19 booster.  Will order screening mammogram today.  Patient would like to hold off on DEXA scan for now.  Also screen for hepatitis C today.  She is due for colonoscopy, but she is having concerns with being able to find someone to transport her to and from the hospital to have the procedure completed.  She also was helping her sister who is having some medical issues.  We did discuss other options including Cologuard and she would like to go forward with that option as opposed to colonoscopy.  We will order Cologuard today.  I have personally reviewed and noted the following in the patient's chart:   . Medical and social history . Use of alcohol, tobacco or illicit drugs  . Current medications and supplements including opioid prescriptions.  . Functional ability and status . Nutritional status . Physical activity . Advanced directives . List of other physicians . Hospitalizations, surgeries, and ER visits in previous 12 months . Vitals . Screenings to include cognitive, depression, and falls . Referrals and appointments  In addition, I have reviewed and discussed with patient certain preventive protocols, quality metrics, and best practice recommendations. A written personalized care plan for preventive services as well as general preventive health recommendations were provided to patient.   Patient will follow up as  scheduled in August or sooner as needed.  She also have her annual's visit scheduled for next year.  Ailene Ards, NP   02/28/2021

## 2021-02-28 NOTE — Telephone Encounter (Signed)
Ordered screening mammogram at [facility].

## 2021-03-01 LAB — HEPATITIS C ANTIBODY
Hepatitis C Ab: NONREACTIVE
SIGNAL TO CUT-OFF: 0 (ref <1.00)

## 2021-03-01 NOTE — Telephone Encounter (Signed)
Was scheduled today. Pt will see on her mychart appt for appt.

## 2021-03-06 DIAGNOSIS — Z1212 Encounter for screening for malignant neoplasm of rectum: Secondary | ICD-10-CM | POA: Diagnosis not present

## 2021-03-06 DIAGNOSIS — Z1211 Encounter for screening for malignant neoplasm of colon: Secondary | ICD-10-CM | POA: Diagnosis not present

## 2021-03-06 LAB — COLOGUARD: Cologuard: NEGATIVE

## 2021-03-07 LAB — COLOGUARD: Cologuard: NEGATIVE

## 2021-03-15 ENCOUNTER — Encounter (INDEPENDENT_AMBULATORY_CARE_PROVIDER_SITE_OTHER): Payer: Self-pay

## 2021-03-15 ENCOUNTER — Telehealth (INDEPENDENT_AMBULATORY_CARE_PROVIDER_SITE_OTHER): Payer: Self-pay | Admitting: Nurse Practitioner

## 2021-03-15 NOTE — Telephone Encounter (Signed)
Cologuard has been updated in chart.

## 2021-03-15 NOTE — Telephone Encounter (Signed)
Please call this patient and let her know that her cologuard test came back negative. This indicates very low risk of her having active colon cancer.   Also, do you know how to document this in the health maintenance section indicating that it has been done? It only will allow me to postpone, discontinue, or mark it as declined by patient as opposed to marking it as being completed when I try to document it.

## 2021-03-15 NOTE — Telephone Encounter (Signed)
Patient called back and I gave her the cologuard results. Patitent verbalized an understanding and thanked [place].

## 2021-03-15 NOTE — Telephone Encounter (Signed)
Called patient and left a detailed voice message for her to call back to discuss results per message.

## 2021-03-29 ENCOUNTER — Other Ambulatory Visit: Payer: Self-pay

## 2021-03-29 ENCOUNTER — Ambulatory Visit (HOSPITAL_COMMUNITY)
Admission: RE | Admit: 2021-03-29 | Discharge: 2021-03-29 | Disposition: A | Discharge status: Home / Self Care | Payer: Medicare Other | Source: Ambulatory Visit | Attending: Nurse Practitioner | Admitting: Nurse Practitioner

## 2021-03-29 DIAGNOSIS — Z1231 Encounter for screening mammogram for malignant neoplasm of breast: Secondary | ICD-10-CM | POA: Diagnosis not present

## 2021-05-09 ENCOUNTER — Encounter (INDEPENDENT_AMBULATORY_CARE_PROVIDER_SITE_OTHER): Payer: Self-pay | Admitting: Nurse Practitioner

## 2021-05-10 ENCOUNTER — Other Ambulatory Visit (INDEPENDENT_AMBULATORY_CARE_PROVIDER_SITE_OTHER): Payer: Self-pay | Admitting: Nurse Practitioner

## 2021-05-10 ENCOUNTER — Telehealth (INDEPENDENT_AMBULATORY_CARE_PROVIDER_SITE_OTHER): Payer: Self-pay

## 2021-05-10 DIAGNOSIS — E039 Hypothyroidism, unspecified: Secondary | ICD-10-CM

## 2021-05-10 DIAGNOSIS — I10 Essential (primary) hypertension: Secondary | ICD-10-CM

## 2021-05-10 DIAGNOSIS — Z780 Asymptomatic menopausal state: Secondary | ICD-10-CM

## 2021-05-10 DIAGNOSIS — E2839 Other primary ovarian failure: Secondary | ICD-10-CM

## 2021-05-10 MED ORDER — METOPROLOL TARTRATE 50 MG PO TABS: 50.0000 mg | ORAL_TABLET | Freq: Every day | ORAL | 0 refills | Status: DC

## 2021-05-10 MED ORDER — THYROID 60 MG PO TABS: 60.0000 mg | ORAL_TABLET | Freq: Every day | ORAL | 0 refills | Status: DC

## 2021-05-10 MED ORDER — ESTRADIOL 1 MG PO TABS: 1.0000 mg | ORAL_TABLET | Freq: Every day | ORAL | 0 refills | Status: DC

## 2021-05-10 MED ORDER — AMLODIPINE BESYLATE 5 MG PO TABS: 5.0000 mg | ORAL_TABLET | Freq: Every day | ORAL | 0 refills | Status: DC

## 2021-05-10 NOTE — Telephone Encounter (Signed)
Patient called and left a detailed voice message that she needs her thyroid medication to go to [facility] and not [facility].  Can you send the NP Thyroid 60 mg to [facility] per patient?

## 2021-05-10 NOTE — Telephone Encounter (Signed)
Refill now changed to [facility]

## 2021-05-16 ENCOUNTER — Encounter (INDEPENDENT_AMBULATORY_CARE_PROVIDER_SITE_OTHER): Payer: Self-pay | Admitting: Nurse Practitioner

## 2021-05-30 ENCOUNTER — Emergency Department (HOSPITAL_COMMUNITY): Payer: Medicare Other

## 2021-05-30 ENCOUNTER — Ambulatory Visit (INDEPENDENT_AMBULATORY_CARE_PROVIDER_SITE_OTHER): Payer: Medicare Other | Admitting: Internal Medicine

## 2021-05-30 ENCOUNTER — Emergency Department (HOSPITAL_COMMUNITY)
Admission: EM | Admit: 2021-05-30 | Discharge: 2021-05-30 | Disposition: A | Discharge status: Home / Self Care | Payer: Medicare Other | Attending: Emergency Medicine | Admitting: Emergency Medicine

## 2021-05-30 ENCOUNTER — Telehealth: Payer: Self-pay

## 2021-05-30 ENCOUNTER — Encounter (HOSPITAL_COMMUNITY): Payer: Self-pay | Admitting: Emergency Medicine

## 2021-05-30 ENCOUNTER — Other Ambulatory Visit: Payer: Self-pay

## 2021-05-30 DIAGNOSIS — E119 Type 2 diabetes mellitus without complications: Secondary | ICD-10-CM | POA: Diagnosis not present

## 2021-05-30 DIAGNOSIS — E039 Hypothyroidism, unspecified: Secondary | ICD-10-CM | POA: Insufficient documentation

## 2021-05-30 DIAGNOSIS — K219 Gastro-esophageal reflux disease without esophagitis: Secondary | ICD-10-CM | POA: Diagnosis not present

## 2021-05-30 DIAGNOSIS — I10 Essential (primary) hypertension: Secondary | ICD-10-CM | POA: Diagnosis not present

## 2021-05-30 DIAGNOSIS — K529 Noninfective gastroenteritis and colitis, unspecified: Secondary | ICD-10-CM | POA: Diagnosis not present

## 2021-05-30 DIAGNOSIS — K7689 Other specified diseases of liver: Secondary | ICD-10-CM | POA: Diagnosis not present

## 2021-05-30 DIAGNOSIS — K8020 Calculus of gallbladder without cholecystitis without obstruction: Secondary | ICD-10-CM | POA: Diagnosis not present

## 2021-05-30 DIAGNOSIS — J45909 Unspecified asthma, uncomplicated: Secondary | ICD-10-CM | POA: Insufficient documentation

## 2021-05-30 DIAGNOSIS — R109 Unspecified abdominal pain: Secondary | ICD-10-CM | POA: Diagnosis not present

## 2021-05-30 DIAGNOSIS — Z79899 Other long term (current) drug therapy: Secondary | ICD-10-CM | POA: Insufficient documentation

## 2021-05-30 DIAGNOSIS — N200 Calculus of kidney: Secondary | ICD-10-CM | POA: Insufficient documentation

## 2021-05-30 DIAGNOSIS — Z87891 Personal history of nicotine dependence: Secondary | ICD-10-CM | POA: Diagnosis not present

## 2021-05-30 DIAGNOSIS — R319 Hematuria, unspecified: Secondary | ICD-10-CM | POA: Diagnosis not present

## 2021-05-30 HISTORY — DX: Calculus of kidney: N20.0

## 2021-05-30 LAB — CBC WITH DIFFERENTIAL/PLATELET
Abs Immature Granulocytes: 0.02 10*3/uL (ref 0.00–0.07)
Basophils Absolute: 0.1 10*3/uL (ref 0.0–0.1)
Basophils Relative: 1 %
Eosinophils Absolute: 0.3 10*3/uL (ref 0.0–0.5)
Eosinophils Relative: 4 %
HCT: 46.2 % — ABNORMAL HIGH (ref 36.0–46.0)
Hemoglobin: 15.4 g/dL — ABNORMAL HIGH (ref 12.0–15.0)
Immature Granulocytes: 0 %
Lymphocytes Relative: 27 %
Lymphs Abs: 1.9 10*3/uL (ref 0.7–4.0)
MCH: 29.3 pg (ref 26.0–34.0)
MCHC: 33.3 g/dL (ref 30.0–36.0)
MCV: 88 fL (ref 80.0–100.0)
Monocytes Absolute: 0.6 10*3/uL (ref 0.1–1.0)
Monocytes Relative: 8 %
Neutro Abs: 4.2 10*3/uL (ref 1.7–7.7)
Neutrophils Relative %: 60 %
Platelets: 238 10*3/uL (ref 150–400)
RBC: 5.25 MIL/uL — ABNORMAL HIGH (ref 3.87–5.11)
RDW: 14.6 % (ref 11.5–15.5)
WBC: 7.1 10*3/uL (ref 4.0–10.5)
nRBC: 0 % (ref 0.0–0.2)

## 2021-05-30 LAB — BASIC METABOLIC PANEL
Anion gap: 5 (ref 5–15)
BUN: 15 mg/dL (ref 8–23)
CO2: 26 mmol/L (ref 22–32)
Calcium: 8.3 mg/dL — ABNORMAL LOW (ref 8.9–10.3)
Chloride: 105 mmol/L (ref 98–111)
Creatinine, Ser: 0.82 mg/dL (ref 0.44–1.00)
GFR, Estimated: >60 mL/min (ref >60)
Glucose, Bld: 142 mg/dL — ABNORMAL HIGH (ref 70–99)
Potassium: 4 mmol/L (ref 3.5–5.1)
Sodium: 136 mmol/L (ref 135–145)

## 2021-05-30 LAB — URINALYSIS, ROUTINE W REFLEX MICROSCOPIC
Bacteria, UA: NONE SEEN
Bilirubin Urine: NEGATIVE
Glucose, UA: NEGATIVE mg/dL
Ketones, ur: NEGATIVE mg/dL
Leukocytes,Ua: NEGATIVE
Nitrite: NEGATIVE
Protein, ur: 30 mg/dL — AB
RBC / HPF: >50 RBC/hpf — ABNORMAL HIGH (ref 0–5)
Specific Gravity, Urine: 1.013 (ref 1.005–1.030)
pH: 5 (ref 5.0–8.0)

## 2021-05-30 MED ORDER — TRAMADOL HCL 50 MG PO TABS: 50.0000 mg | ORAL_TABLET | Freq: Four times a day (QID) | ORAL | 0 refills | Status: DC | PRN

## 2021-05-30 MED ORDER — FENTANYL CITRATE PF 50 MCG/ML IJ SOSY
50.0000 ug | PREFILLED_SYRINGE | Freq: Once | INTRAMUSCULAR | Status: AC
  Administered 2021-05-30: 50 ug via INTRAVENOUS
  Filled 2021-05-30: qty 1

## 2021-05-30 MED ORDER — ONDANSETRON 4 MG PO TBDP: 4.0000 mg | ORAL_TABLET | Freq: Three times a day (TID) | ORAL | 0 refills | Status: DC | PRN

## 2021-05-30 MED ORDER — ONDANSETRON HCL 4 MG/2ML IJ SOLN
4.0000 mg | Freq: Once | INTRAMUSCULAR | Status: AC
  Administered 2021-05-30: 4 mg via INTRAVENOUS
  Filled 2021-05-30: qty 2

## 2021-05-30 NOTE — ED Provider Notes (Signed)
[facility] EMERGENCY DEPARTMENT Provider Note   CSN: [id number] Arrival date & time: 05/30/21  1414     History Chief Complaint  Patient presents with   Flank Pain    [name] is a 70 y.o. female.   Flank Pain Pertinent negatives include no chest pain and no shortness of breath. Patient presents with right flank pain.  Began yesterday.  Feels like previous kidney stones.  Has had some hematuria now.  Pain is sharp and crampy.  No fevers or chills.  Some nausea without vomiting.  No trauma.  States the pain it was better sitting still.  Patient states she took an old tramadol she had and this helped with the pain.     Past Medical History:  Diagnosis Date   Arthritis    Asthma    takes Singulair nightly   Diabetes mellitus    takes levemir daily and Metformin daily   Generalized headaches    GERD (gastroesophageal reflux disease)    takes Omeprazole daily   Herniated disc, cervical    History of kidney stones    Hyperlipidemia    takes Welchol daily   Hypertension    takes Enalapril dialy   Hypothyroidism    takes Synthroid daily   IBS (irritable bowel syndrome)    Joint pain    hands and neck   Kidney stones    Nocturia    PONV (postoperative nausea and vomiting)    Seizures ([place])    denies   TIA (transient ischemic attack)    diagnosed by EEG but never knew she had it   Trigger finger    Weakness    Weakness    in both hands    Patient Active Problem List   Diagnosis Date Noted   Diabetes mellitus without complication ([place]) [id number]   Primary hypothyroidism 07/06/2015   Benign hypertension 07/06/2015   Mixed hyperlipidemia 07/06/2015   HNP (herniated nucleus pulposus), cervical 10/18/2013   GERD (gastroesophageal reflux disease) 10/15/2012   Abdominal pain, chronic, right upper quadrant 10/15/2012    Past Surgical History:  Procedure Laterality Date   ABDOMINAL HYSTERECTOMY  1980   ANTERIOR CERVICAL DECOMP/DISCECTOMY FUSION N/A 10/18/2013    Procedure: Cervical three-four, Cervical four-five, Cervical seven-Thoracic one anterior cervical decompression with fusion plating and bonegraft;  Surgeon: Hosie Spangle, MD;  Location: [facility];  Service: Neurosurgery;  Laterality: N/A;  Cervical three-four, Cervical four-five, Cervical seven-Thoracic one anterior cervical decompression with fusion plating and bonegraft   APPENDECTOMY     done with hysterectomy   BACK SURGERY  2007   lumbar fusion   carpel tunnel Bilateral    left first then right   cataract implants     COLONOSCOPY     ESOPHAGOGASTRODUODENOSCOPY     hx of h pylori   EYE SURGERY  11/18/2008   right - cataracts   EYE SURGERY  06/01/2009   left - cataracts   herniated disc   10/2000, 08/31/2008   C5-C6 then C6-C7   knee anthroscopy bilateral Bilateral    KNEE ARTHROSCOPY     LITHOTRIPSY  11/12/2007   right side   Parathyroid surgery     October 2013   PARATHYROIDECTOMY  07/02/2012   Procedure: PARATHYROIDECTOMY;  Surgeon: Earnstine Regal, MD;  Location: [facility];  Service: General;  Laterality: N/A;   TONSILLECTOMY     as a child     OB History   No obstetric history on file.  Family History  Problem Relation Age of Onset   Cancer Mother        pancreatic    Social History   Tobacco Use   Smoking status: Former    Packs/day: 1.00    Years: 40.00    Pack years: 40.00    Types: Cigarettes    Quit date: 07/19/2012    Years since quitting: 8.8   Smokeless tobacco: Never   Tobacco comments:    quit smoking in Oct 2013  Vaping Use   Vaping Use: Never used  Substance Use Topics   Alcohol use: No    Alcohol/week: 0.0 standard drinks   Drug use: No    Home Medications Prior to Admission medications   Medication Sig Start Date End Date Taking? Authorizing Provider  ondansetron (ZOFRAN-ODT) 4 MG disintegrating tablet Take 1 tablet (4 mg total) by mouth every 8 (eight) hours as needed for nausea or vomiting. 05/30/21  Yes Davonna Belling, MD   traMADol (ULTRAM) 50 MG tablet Take 1 tablet (50 mg total) by mouth every 6 (six) hours as needed. 05/30/21  Yes Davonna Belling, MD  albuterol (VENTOLIN HFA) 108 (90 Base) MCG/ACT inhaler Inhale 1-2 puffs into the lungs every 6 (six) hours as needed for wheezing or shortness of breath. 03/05/20   Long, Wonda Olds, MD  amLODipine (NORVASC) 5 MG tablet Take 1 tablet (5 mg total) by mouth daily. 05/10/21   Ailene Ards, NP  estradiol (ESTRACE) 1 MG tablet Take 1 tablet (1 mg total) by mouth daily. 05/10/21   Ailene Ards, NP  metoprolol tartrate (LOPRESSOR) 50 MG tablet Take 1 tablet (50 mg total) by mouth daily at 12 noon. 05/10/21   Ailene Ards, NP  thyroid (NP THYROID) 60 MG tablet Take 1 tablet (60 mg total) by mouth daily before breakfast. 05/10/21   Ailene Ards, NP    Allergies    Advair diskus [fluticasone-salmeterol], Codeine, Hydrocodone-acetaminophen, Oxycodone-acetaminophen, and Tetracyclines & related  Review of Systems   Review of Systems  Constitutional:  Negative for appetite change.  HENT:  Negative for congestion.   Respiratory:  Negative for shortness of breath.   Cardiovascular:  Negative for chest pain.  Gastrointestinal:  Positive for nausea.  Genitourinary:  Positive for flank pain and hematuria.  Musculoskeletal:  Positive for back pain.  Neurological:  Negative for weakness.  Psychiatric/Behavioral:  Negative for confusion.    Physical Exam Updated Vital Signs BP 121/65   Pulse 67   Temp 97.9 F (36.6 C) (Oral)   Resp 17   Ht '5\' 6"'$  (1.676 m)   Wt 67.1 kg   SpO2 94%   BMI 23.89 kg/m   Physical Exam Vitals and nursing note reviewed.  Constitutional:      Comments: Patient appears mildly uncomfortable.  HENT:     Head: Atraumatic.  Eyes:     Pupils: Pupils are equal, round, and reactive to light.  Cardiovascular:     Rate and Rhythm: Regular rhythm.  Pulmonary:     Breath sounds: No wheezing or rhonchi.  Abdominal:     Tenderness: There is no  abdominal tenderness.  Genitourinary:    Comments: CVA tenderness on right.  No rash. Musculoskeletal:     Right lower leg: No edema.     Left lower leg: No edema.  Skin:    General: Skin is warm.     Capillary Refill: Capillary refill takes less than 2 seconds.  Neurological:     Mental  Status: She is alert and oriented to person, place, and time.    ED Results / Procedures / Treatments   Labs (all labs ordered are listed, but only abnormal results are displayed) Labs Reviewed  BASIC METABOLIC PANEL - Abnormal; Notable for the following components:      Result Value   Glucose, Bld 142 (*)    Calcium 8.3 (*)    All other components within normal limits  CBC WITH DIFFERENTIAL/PLATELET - Abnormal; Notable for the following components:   RBC 5.25 (*)    Hemoglobin 15.4 (*)    HCT 46.2 (*)    All other components within normal limits  URINALYSIS, ROUTINE W REFLEX MICROSCOPIC - Abnormal; Notable for the following components:   APPearance HAZY (*)    Hgb urine dipstick LARGE (*)    Protein, ur 30 (*)    RBC / HPF >50 (*)    All other components within normal limits    EKG None  Radiology CT Renal Stone Study  Result Date: 05/30/2021 CLINICAL DATA:  Right flank EXAM: CT ABDOMEN AND PELVIS WITHOUT CONTRAST TECHNIQUE: Multidetector CT imaging of the abdomen and pelvis was performed following the standard protocol without IV contrast. COMPARISON:  CT 04/05/2016 FINDINGS: Lower chest: Lung bases demonstrate mild subpleural fibrosis. Normal cardiac size. Hepatobiliary: Small calcified gallstone. No biliary dilatation. There are vague hypodensities within the right hepatic lobe. These are present on 2017 comparison study and may be related to vascular structures. Pancreas: Unremarkable. No pancreatic ductal dilatation or surrounding inflammatory changes. Spleen: Normal in size without focal abnormality. Adrenals/Urinary Tract: Adrenal glands are normal. Multiple intrarenal stones. 16 mm  oval stone in the right renal pelvis without significant hydronephrosis. No ureteral stones. Bladder is normal Stomach/Bowel: Stomach is within normal limits. Appendix not well seen but no right lower quadrant inflammatory process. No evidence of bowel wall thickening, distention, or inflammatory changes. Vascular/Lymphatic: Moderate aortic atherosclerosis. No aneurysm. No suspicious nodes Reproductive: Status post hysterectomy. No adnexal masses. Other: Negative for pelvic effusion or free air Musculoskeletal: No acute or significant osseous findings. There are degenerative changes of the spine. IMPRESSION: 1. Multiple intrarenal stones, including 16 mm stone in right renal pelvis without significant hydronephrosis. No ureteral stones are visualized. 2. No CT evidence for acute intra-abdominal or pelvic abnormality 3. Gallstones Electronically Signed   By: Donavan Foil M.D.   On: 05/30/2021 21:38    Procedures Procedures   Medications Ordered in ED Medications  fentaNYL (SUBLIMAZE) injection 50 mcg (50 mcg Intravenous Given 05/30/21 2139)  ondansetron [facility]) injection 4 mg (4 mg Intravenous Given 05/30/21 2139)    ED Course  I have reviewed the triage vital signs and the nursing notes.  Pertinent labs & imaging results that were available during my care of the patient were reviewed by me and considered in my medical decision making (see chart for details).    MDM Rules/Calculators/A&P                           Patient with right-sided flank pain.  Goes to back.  Hematuria.  Feels like previous kidney stones.  Urine does show hematuria however CT scan does not show an obstructing stone although she does have multiple kidney stones.  No other cause of pain seen however.  Well-appearing.  Discharge home with symptomatic treatment.  Urology follow-up. Final Clinical Impression(s) / ED Diagnoses Final diagnoses:  Right flank pain  Hematuria, unspecified type  Kidney stone  Rx / DC  Orders ED Discharge Orders          Ordered    traMADol (ULTRAM) 50 MG tablet  Every 6 hours PRN        05/30/21 2343    ondansetron (ZOFRAN-ODT) 4 MG disintegrating tablet  Every 8 hours PRN        05/30/21 2343             Davonna Belling, MD 05/31/21 [phone]

## 2021-05-30 NOTE — Telephone Encounter (Signed)
Patient called and has not seen her new provider yet and stated that she was having severe pain in her back this morning and she stated that it almost made her fall to her knees because the pain was so bad. Patient rated her pain this morning an 8 out of 1-10 with 10 being severe. Patient stated that she thinks she has a kidney stone and does have blood in her urine. Patient stated that the pain started over 2 weeks ago and is still in her back area. Patient has not been feeling well and is very weak. I advised for patient to go to the [facility] ER to be evaluated. Patient verbalized an understanding and is going there now.

## 2021-05-30 NOTE — ED Triage Notes (Signed)
RT flank pain, RT lower abd pain.  Pt states she thinks its a kidney stone because she has a hx.  Has been taking tramadol with some relief.

## 2021-06-01 DIAGNOSIS — N200 Calculus of kidney: Secondary | ICD-10-CM | POA: Diagnosis not present

## 2021-06-06 ENCOUNTER — Other Ambulatory Visit: Payer: Self-pay | Admitting: Urology

## 2021-06-08 ENCOUNTER — Other Ambulatory Visit (HOSPITAL_COMMUNITY): Payer: Self-pay | Admitting: Urology

## 2021-06-08 DIAGNOSIS — N200 Calculus of kidney: Secondary | ICD-10-CM

## 2021-06-18 NOTE — Patient Instructions (Signed)

## 2021-06-19 ENCOUNTER — Other Ambulatory Visit: Payer: Self-pay

## 2021-06-19 ENCOUNTER — Ambulatory Visit (INDEPENDENT_AMBULATORY_CARE_PROVIDER_SITE_OTHER): Payer: Medicare Other | Admitting: Nurse Practitioner

## 2021-06-19 ENCOUNTER — Encounter: Payer: Self-pay | Admitting: Nurse Practitioner

## 2021-06-19 VITALS — BP 132/67 | HR 71 | Ht 66.0 in | Wt 148.4 lb

## 2021-06-19 DIAGNOSIS — D351 Benign neoplasm of parathyroid gland: Secondary | ICD-10-CM | POA: Diagnosis not present

## 2021-06-19 DIAGNOSIS — E039 Hypothyroidism, unspecified: Secondary | ICD-10-CM | POA: Diagnosis not present

## 2021-06-19 DIAGNOSIS — E559 Vitamin D deficiency, unspecified: Secondary | ICD-10-CM | POA: Diagnosis not present

## 2021-06-19 DIAGNOSIS — E119 Type 2 diabetes mellitus without complications: Secondary | ICD-10-CM | POA: Diagnosis not present

## 2021-06-19 NOTE — Progress Notes (Signed)
Endocrinology Consult Note                                         06/19/2021, 11:02 AM  Subjective:   Subjective    [name] is a 70 y.o.-year-old female patient being seen in consultation for hypothyroidism, diabetes, hx parathyroid adenoma referred by Lindell Spar, MD.   Past Medical History:  Diagnosis Date   Arthritis    Asthma    takes Singulair nightly   Diabetes mellitus    takes levemir daily and Metformin daily   Generalized headaches    GERD (gastroesophageal reflux disease)    takes Omeprazole daily   Herniated disc, cervical    History of kidney stones    Hyperlipidemia    takes Welchol daily   Hypertension    takes Enalapril dialy   Hypothyroidism    takes Synthroid daily   IBS (irritable bowel syndrome)    Joint pain    hands and neck   Kidney stones    Nocturia    PONV (postoperative nausea and vomiting)    Seizures ([place])    denies   TIA (transient ischemic attack)    diagnosed by EEG but never knew she had it   Trigger finger    Weakness    Weakness    in both hands    Past Surgical History:  Procedure Laterality Date   [place] DECOMP/DISCECTOMY FUSION N/A 10/18/2013   Procedure: Cervical three-four, Cervical four-five, Cervical seven-Thoracic one anterior cervical decompression with fusion plating and bonegraft;  Surgeon: Hosie Spangle, MD;  Location: [facility];  Service: Neurosurgery;  Laterality: N/A;  Cervical three-four, Cervical four-five, Cervical seven-Thoracic one anterior cervical decompression with fusion plating and bonegraft   APPENDECTOMY     done with hysterectomy   BACK SURGERY  2007   lumbar fusion   carpel tunnel Bilateral    left first then right   cataract implants     COLONOSCOPY     ESOPHAGOGASTRODUODENOSCOPY     hx of h pylori   EYE SURGERY  11/18/2008   right - cataracts   EYE SURGERY  06/01/2009    left - cataracts   herniated disc   10/2000, 08/31/2008   C5-C6 then C6-C7   knee anthroscopy bilateral Bilateral    KNEE ARTHROSCOPY     LITHOTRIPSY  11/12/2007   right side   Parathyroid surgery     October 2013   PARATHYROIDECTOMY  07/02/2012   Procedure: PARATHYROIDECTOMY;  Surgeon: Earnstine Regal, MD;  Location: [facility];  Service: General;  Laterality: N/A;   TONSILLECTOMY     as a child    Social History   Socioeconomic History   Marital status: Widowed    Spouse name: Not on file   Number of children: Not on file   Years of education: Not on file   Highest education level: Not on file  Occupational History   Not on  file  Tobacco Use   Smoking status: Former    Packs/day: 1.00    Years: 40.00    Pack years: 40.00    Types: Cigarettes    Quit date: 07/19/2012    Years since quitting: 8.9   Smokeless tobacco: Never   Tobacco comments:    quit smoking in 07/21/12  Vaping Use   Vaping Use: Never used  Substance and Sexual Activity   Alcohol use: No    Alcohol/week: 0.0 standard drinks   Drug use: No   Sexual activity: Not on file  Other Topics Concern   Not on file  Social History Narrative   Widow,husband died 22-Jul-2019 after 19 years marriage.Retired.   Social Determinants of Health   Financial Resource Strain: Not on file  Food Insecurity: Not on file  Transportation Needs: Not on file  Physical Activity: Not on file  Stress: Not on file  Social Connections: Not on file    Family History  Problem Relation Age of Onset   Cancer Mother        pancreatic    Outpatient Encounter Medications as of 06/19/2021  Medication Sig   albuterol (VENTOLIN HFA) 108 (90 Base) MCG/ACT inhaler Inhale 1-2 puffs into the lungs every 6 (six) hours as needed for wheezing or shortness of breath.   amLODipine (NORVASC) 5 MG tablet Take 1 tablet (5 mg total) by mouth daily.   estradiol (ESTRACE) 1 MG tablet Take 1 tablet (1 mg total) by mouth daily.   metoprolol  tartrate (LOPRESSOR) 50 MG tablet Take 1 tablet (50 mg total) by mouth daily at 12 noon.   ondansetron (ZOFRAN-ODT) 4 MG disintegrating tablet Take 1 tablet (4 mg total) by mouth every 8 (eight) hours as needed for nausea or vomiting.   thyroid (NP THYROID) 60 MG tablet Take 1 tablet (60 mg total) by mouth daily before breakfast.   traMADol (ULTRAM) 50 MG tablet Take 1 tablet (50 mg total) by mouth every 6 (six) hours as needed.   No facility-administered encounter medications on file as of 06/19/2021.    ALLERGIES: Allergies  Allergen Reactions   Advair Diskus [Fluticasone-Salmeterol] Anaphylaxis   Codeine Nausea And Vomiting   Hydrocodone-Acetaminophen Nausea And Vomiting   Oxycodone-Acetaminophen Nausea And Vomiting   Tetracyclines & Related Itching    Internal   VACCINATION STATUS: Immunization History  Administered Date(s) Administered   Fluad Quad(high Dose 65+) 08/17/2019   Influenza, High Dose Seasonal PF 07/14/2020   Moderna Sars-Covid-2 Vaccination 11/24/2019, 12/22/2019, 07/07/2020   Tdap 07/12/2014     HPI   [name] is a patient with the above medical history. she was diagnosed with parathyroid adenoma in 2013 which required parathyroidectomy done by Dr. Armandina Gemma.  She is now experiencing similar symptoms that she had at that time and would like to investigate whether or not there could be a problem with one of her other parathyroid glands.  She thinks she remembers being started on thyroid hormone replacement after her parathyroid removal due to possible injury to the thyroid gland during its removal.  She was a patient of Dr. Anastasio Champion who had started the patient on NP thyroid likely as a result of accidental injury to the thyroid tissue during parathyroidectomy surgery   she was given various doses of NP thyroid over the years, currently on 60 mg. she reports compliance to this medication:  Taking it daily on empty stomach  with water, separated by >30 minutes  before breakfast and  other medications, and by at least 4 hours from calcium, iron, PPIs, multivitamins .  I reviewed patient's thyroid tests:  Lab Results  Component Value Date   TSH 0.57 10/14/2019   TSH 0.15 (L) 06/29/2019   TSH 2.39 06/23/2018   TSH 2.66 04/16/2017   TSH 1.98 10/14/2016   TSH 4.37 04/04/2016   TSH 3.52 01/04/2016   TSH 1.619 09/29/2015   FREET4 1.4 06/23/2018   FREET4 1.4 04/16/2017   FREET4 1.5 10/14/2016   FREET4 1.1 04/04/2016   FREET4 1.5 01/04/2016   FREET4 1.15 09/29/2015     Pt describes: - kidney stone - fatigue - body aches   Pt denies feeling nodules in neck, hoarseness,  SOB with lying down.  She does report intermittent trouble swallowing pills, something new.  she denies family history of thyroid disorders.  No family history of thyroid cancer.  No history of radiation therapy to head or neck.  No recent use of iodine supplements.  Denies use of Biotin containing supplements.  I reviewed her chart and she also has a history of parathyroid adenoma, controlled Diabetes without medications, GERD, HTN.   ROS:  Constitutional: no weight gain/loss, + fatigue, no subjective hyperthermia, no subjective hypothermia Eyes: no blurry vision, no xerophthalmia ENT: no sore throat, no nodules palpated in throat, no dysphagia/odynophagia, no hoarseness Cardiovascular: no chest pain, no SOB, no palpitations, no leg swelling Respiratory: no cough, no SOB Gastrointestinal: no nausea/vomiting/diarrhea Genitourinary: has large kidney stone needing surgical removal soon Musculoskeletal: diffuse muscle/joint aches Skin: no rashes Neurological: no tremors, no numbness, no tingling, no dizziness Psychiatric: no depression, no anxiety   Objective:   Objective     BP 132/67   Pulse 71   Ht 5\' 6"  (1.676 m)   Wt 148 lb 6.4 oz (67.3 kg)   BMI 23.95 kg/m  Wt Readings from Last 3 Encounters:  06/19/21 148 lb 6.4 oz (67.3 kg)  05/30/21 148 lb (67.1 kg)   02/28/21 146 lb 12.8 oz (66.6 kg)    BP Readings from Last 3 Encounters:  06/19/21 132/67  05/30/21 121/65  02/28/21 128/68     Constitutional:  Body mass index is 23.95 kg/m., not in acute distress, normal state of mind Eyes: PERRLA, EOMI, no exophthalmos ENT: moist mucous membranes, no thyromegaly, no cervical lymphadenopathy Cardiovascular: normal precordial activity, RRR, no murmur/rubs/gallops Respiratory:  adequate breathing efforts, no gross chest deformity, Clear to auscultation bilaterally Gastrointestinal: abdomen soft, non-tender, no distension, bowel sounds present Musculoskeletal: no gross deformities, strength intact in all four extremities Skin: moist, warm, no rashes Neurological: mild tremor with outstretched hands, deep tendon reflexes normal in BLE.   CMP ( most recent) CMP     Component Value Date/Time   NA 136 05/30/2021 2126   K 4.0 05/30/2021 2126   CL 105 05/30/2021 2126   CO2 26 05/30/2021 2126   GLUCOSE 142 (H) 05/30/2021 2126   BUN 15 05/30/2021 2126   CREATININE 0.82 05/30/2021 2126   CREATININE 0.81 12/25/2020 1034   CALCIUM 8.3 (L) 05/30/2021 2126   PROT 5.9 (L) 12/25/2020 1034   ALBUMIN 3.9 04/16/2017 0744   AST 17 12/25/2020 1034   ALT 19 12/25/2020 1034   ALKPHOS 60 04/16/2017 0744   BILITOT 0.5 12/25/2020 1034   GFRNONAA >60 05/30/2021 2126   GFRNONAA 73 12/25/2020 1034   GFRAA 85 12/25/2020 1034     Diabetic Labs (most recent): Lab Results  Component Value Date   HGBA1C 5.9 (H) 12/25/2020  HGBA1C 5.6 08/21/2020   HGBA1C 6.0 (H) 04/17/2020     Lipid Panel ( most recent) Lipid Panel     Component Value Date/Time   CHOL 150 12/25/2020 1034   TRIG 177 (H) 12/25/2020 1034   HDL 38 (L) 12/25/2020 1034   CHOLHDL 3.9 12/25/2020 1034   VLDL 33 (H) 10/14/2016 0721   LDLCALC 85 12/25/2020 1034       Lab Results  Component Value Date   TSH 0.57 10/14/2019   TSH 0.15 (L) 06/29/2019   TSH 2.39 06/23/2018   TSH 2.66  04/16/2017   TSH 1.98 10/14/2016   TSH 4.37 04/04/2016   TSH 3.52 01/04/2016   TSH 1.619 09/29/2015   FREET4 1.4 06/23/2018   FREET4 1.4 04/16/2017   FREET4 1.5 10/14/2016   FREET4 1.1 04/04/2016   FREET4 1.5 01/04/2016   FREET4 1.15 09/29/2015      Assessment & Plan:   ASSESSMENT / PLAN:  1. History of parathyroid adenoma- s/p parathyroidectomy  -Will order PTH and Calcium to be rechecked given the return in her similar symptoms.  2. Hypothyroidism- unspecified   Patient with long-standing hypothyroidism, on NP thyroid 60 mg.   She is advised to stay on this same dose and will recheck thyroid labs (including antibody testing to rule out autoimmune thyroid dysfunction) and discuss tapering her off NP thyroid and switching to Levothyroxine (easier to adjust dosage based on thyroid labs) if labs still suggest hypothyroidism.  On physical exam, patient does not have gross goiter, thyroid nodules, or neck compression symptoms.  However, she does have trouble swallowing at times, therefore will order baseline thyroid ultrasound to be performed to rule out thyroid involvement.  - We discussed about correct intake of levothyroxine, at fasting, with water, separated by at least 30 minutes from breakfast, and separated by more than 4 hours from calcium, iron, multivitamins, acid reflux medications (PPIs). -Patient is made aware of the fact that thyroid hormone replacement is needed for life, dose to be adjusted by periodic monitoring of thyroid function tests.  3. Controlled Diabetes  Her most recent A1c back in 12/25/20 was 5.9%.  She is not currently on any medications for her diabetes, she is able to control it by lifestyle modification alone.  Since she is having blood drawn for her thyroid, will include A1c to be done as well.  - Nutritional counseling repeated at each appointment due to patients tendency to fall back in to old habits.  - The patient admits there is a room for  improvement in their diet and drink choices. -  Suggestion is made for the patient to avoid simple carbohydrates from their diet including Cakes, Sweet Desserts / Pastries, Ice Cream, Soda (diet and regular), Sweet Tea, Candies, Chips, Cookies, Sweet Pastries, Store Bought Juices, Alcohol in Excess of 1-2 drinks a day, Artificial Sweeteners, Coffee Creamer, and "Sugar-free" Products. This will help patient to have stable blood glucose profile and potentially avoid unintended weight gain.   - I encouraged the patient to switch to unprocessed or minimally processed complex starch and increased protein intake (animal or plant source), fruits, and vegetables.   - Patient is advised to stick to a routine mealtimes to eat 3 meals a day and avoid unnecessary snacks (to snack only to correct hypoglycemia).      I spent 60 minutes in the care of the patient today including review of labs from [place], Lipids, Thyroid Function, Hematology (current and previous including abstractions from other facilities); face-to-face  time discussing  her blood glucose readings/logs, discussing hypoglycemia and hyperglycemia episodes and symptoms, medications doses, her options of short and long term treatment based on the latest standards of care / guidelines;  discussion about incorporating lifestyle medicine;  and documenting the encounter.    Please refer to Patient Instructions for Blood Glucose Monitoring and Insulin/Medications Dosing Guide"  in media tab for additional information. Please  also refer to " Patient Self Inventory" in the Media  tab for reviewed elements of pertinent patient history.  [name] participated in the discussions, expressed understanding, and voiced agreement with the above plans.  All questions were answered to her satisfaction. she is encouraged to contact clinic should she have any questions or concerns prior to her return visit.   FOLLOW UP PLAN:  Return in about 3 weeks (around  07/10/2021) for thyroid ultrasound, Thyroid follow up, Previsit labs.  [name], [facility] [address] [place],  [address] Phone: [phone] Fax: [phone]  06/19/2021, 11:02 AM

## 2021-06-20 ENCOUNTER — Ambulatory Visit: Payer: Medicare Other | Admitting: Nurse Practitioner

## 2021-06-26 ENCOUNTER — Ambulatory Visit (HOSPITAL_COMMUNITY)
Admission: RE | Admit: 2021-06-26 | Discharge: 2021-06-26 | Disposition: A | Discharge status: Home / Self Care | Payer: Medicare Other | Source: Ambulatory Visit | Attending: Nurse Practitioner | Admitting: Nurse Practitioner

## 2021-06-26 ENCOUNTER — Other Ambulatory Visit: Payer: Self-pay

## 2021-06-26 DIAGNOSIS — E079 Disorder of thyroid, unspecified: Secondary | ICD-10-CM | POA: Diagnosis not present

## 2021-06-26 DIAGNOSIS — D351 Benign neoplasm of parathyroid gland: Secondary | ICD-10-CM | POA: Insufficient documentation

## 2021-06-30 DIAGNOSIS — Z23 Encounter for immunization: Secondary | ICD-10-CM | POA: Diagnosis not present

## 2021-07-03 ENCOUNTER — Encounter: Payer: Self-pay | Admitting: Internal Medicine

## 2021-07-03 ENCOUNTER — Other Ambulatory Visit: Payer: Self-pay

## 2021-07-03 ENCOUNTER — Ambulatory Visit (INDEPENDENT_AMBULATORY_CARE_PROVIDER_SITE_OTHER): Payer: Medicare Other | Admitting: Internal Medicine

## 2021-07-03 ENCOUNTER — Telehealth: Payer: Self-pay

## 2021-07-03 ENCOUNTER — Telehealth: Payer: Self-pay | Admitting: Nurse Practitioner

## 2021-07-03 VITALS — BP 147/69 | HR 81 | Temp 98.0°F | Resp 18 | Ht 66.0 in | Wt 151.0 lb

## 2021-07-03 DIAGNOSIS — I10 Essential (primary) hypertension: Secondary | ICD-10-CM

## 2021-07-03 DIAGNOSIS — E1169 Type 2 diabetes mellitus with other specified complication: Secondary | ICD-10-CM

## 2021-07-03 DIAGNOSIS — E782 Mixed hyperlipidemia: Secondary | ICD-10-CM

## 2021-07-03 DIAGNOSIS — Z7689 Persons encountering health services in other specified circumstances: Secondary | ICD-10-CM | POA: Diagnosis not present

## 2021-07-03 DIAGNOSIS — E039 Hypothyroidism, unspecified: Secondary | ICD-10-CM

## 2021-07-03 DIAGNOSIS — E119 Type 2 diabetes mellitus without complications: Secondary | ICD-10-CM | POA: Diagnosis not present

## 2021-07-03 DIAGNOSIS — D351 Benign neoplasm of parathyroid gland: Secondary | ICD-10-CM | POA: Diagnosis not present

## 2021-07-03 DIAGNOSIS — E892 Postprocedural hypoparathyroidism: Secondary | ICD-10-CM

## 2021-07-03 DIAGNOSIS — E559 Vitamin D deficiency, unspecified: Secondary | ICD-10-CM | POA: Diagnosis not present

## 2021-07-03 MED ORDER — METOPROLOL TARTRATE 25 MG PO TABS: 25.0000 mg | ORAL_TABLET | Freq: Two times a day (BID) | ORAL | 5 refills | Status: DC

## 2021-07-03 MED ORDER — AMLODIPINE BESYLATE 5 MG PO TABS: 5.0000 mg | ORAL_TABLET | Freq: Every day | ORAL | 1 refills | Status: DC

## 2021-07-03 NOTE — Patient Instructions (Signed)
Okay to hold Estradiol after 1 week.  Please continue taking other medications as prescribed.

## 2021-07-03 NOTE — Telephone Encounter (Signed)
I called this patient and she stated that she has some concerns and will discuss with Whitney when she comes in for her appointment next week. Patient verbalized an understanding.

## 2021-07-03 NOTE — Telephone Encounter (Signed)
Pt did [place] 9/27

## 2021-07-03 NOTE — Telephone Encounter (Signed)
Pt appt with [place] is next week, I do not see she has been scheduled for her [place] yet. Can you check on this.

## 2021-07-03 NOTE — Assessment & Plan Note (Signed)
Care established History and medications reviewed with the patient 

## 2021-07-03 NOTE — Telephone Encounter (Signed)
Noted  

## 2021-07-03 NOTE — Assessment & Plan Note (Signed)
Lab Results  Component Value Date   HGBA1C 5.9 (H) 12/25/2020   Diet controlled Was on medications in the past Does not prefer to take medications for DM or statin Diabetic foot exam: Today Advised to follow up with Ophthalmology for diabetic eye exam.

## 2021-07-03 NOTE — Assessment & Plan Note (Signed)
Will check lipid profile later 

## 2021-07-03 NOTE — Progress Notes (Signed)
New Patient Office Visit  Subjective:  Patient ID: DRU LAUREL, female    DOB: 06-20-1951  Age: 70 y.o. MRN: [id number]  CC:  Chief Complaint  Patient presents with   New Patient (Initial Visit)    New patient was seeing dr Anastasio Champion has kidney stone she is set to have surgically removed also thinks she has another parathyroid tumor but sees nida for this she has been having nausea but doesn't like the ondansetron as it makes her dizzy     HPI [name] is a 70 year old female with PMH of HTN, GERD, type II DM, hypothyroidism, HLD and parathyroid adenoma who presents for establishing care.  HTN: Her blood pressure was elevated in the office today.  She appears to be anxious today.  She takes amlodipine and metoprolol regularly.  She denies any headache, dizziness, chest pain, dyspnea or palpitations.  Hyperparathyroidism: She has had partial parathyroidectomy in the past.  She has been having fatigue and myalgias lately and thinks that her symptoms are similar to the past when she had hyperparathyroidism diagnosed.  Of note, her last CMP showed normal calcium levels.  She is followed by Dr. Dorris Fetch.  She has been taking NP thyroid.  Chart review suggests normal TSH in the past.  Endocrinology has been trying to down titrate NP thyroid and she will be switched to levothyroxine if needed.  She denies any recent change in weight or appetite.  Type II DM: Diet controlled, she used to take medications for DM in the past.  Her HbA1c was 5.9 in 11/2020.  She denies any polyuria or polydipsia.  She has had 4 doses of COVID-vaccine.  Past Medical History:  Diagnosis Date   Arthritis    Asthma    takes Singulair nightly   Diabetes mellitus    takes levemir daily and Metformin daily   Generalized headaches    GERD (gastroesophageal reflux disease)    takes Omeprazole daily   Herniated disc, cervical    History of kidney stones    Hyperlipidemia    takes Welchol daily   Hypertension     takes Enalapril dialy   Hypothyroidism    takes Synthroid daily   IBS (irritable bowel syndrome)    Joint pain    hands and neck   Kidney stones    Nocturia    PONV (postoperative nausea and vomiting)    Seizures ([place])    denies   TIA (transient ischemic attack)    diagnosed by EEG but never knew she had it   Trigger finger    Weakness    Weakness    in both hands    Past Surgical History:  Procedure Laterality Date   ABDOMINAL HYSTERECTOMY  1980   ANTERIOR CERVICAL DECOMP/DISCECTOMY FUSION N/A 10/18/2013   Procedure: Cervical three-four, Cervical four-five, Cervical seven-Thoracic one anterior cervical decompression with fusion plating and bonegraft;  Surgeon: Hosie Spangle, MD;  Location: [facility];  Service: Neurosurgery;  Laterality: N/A;  Cervical three-four, Cervical four-five, Cervical seven-Thoracic one anterior cervical decompression with fusion plating and bonegraft   APPENDECTOMY     done with hysterectomy   BACK SURGERY  2007   lumbar fusion   carpel tunnel Bilateral    left first then right   cataract implants     COLONOSCOPY     ESOPHAGOGASTRODUODENOSCOPY     hx of h pylori   EYE SURGERY  11/18/2008   right - cataracts   EYE SURGERY  06/01/2009   left - cataracts   herniated disc   10/2000, 08/31/2008   C5-C6 then C6-C7   knee anthroscopy bilateral Bilateral    KNEE ARTHROSCOPY     LITHOTRIPSY  11/12/2007   right side   Parathyroid surgery     07/25/2012   PARATHYROIDECTOMY  07/02/2012   Procedure: PARATHYROIDECTOMY;  Surgeon: Earnstine Regal, MD;  Location: [facility];  Service: General;  Laterality: N/A;   TONSILLECTOMY     as a child    Family History  Problem Relation Age of Onset   Cancer Mother        pancreatic    Social History   Socioeconomic History   Marital status: Widowed    Spouse name: Not on file   Number of children: Not on file   Years of education: Not on file   Highest education level: Not on file  Occupational History    Not on file  Tobacco Use   Smoking status: Former    Packs/day: 1.00    Years: 40.00    Pack years: 40.00    Types: Cigarettes    Quit date: 07/19/2012    Years since quitting: 8.9   Smokeless tobacco: Never   Tobacco comments:    quit smoking in 25-Jul-2012  Vaping Use   Vaping Use: Never used  Substance and Sexual Activity   Alcohol use: No    Alcohol/week: 0.0 standard drinks   Drug use: No   Sexual activity: Not on file  Other Topics Concern   Not on file  Social History Narrative   Widow,husband died July 26, 2019 after 69 years marriage.Retired.   Social Determinants of Health   Financial Resource Strain: Not on file  Food Insecurity: Not on file  Transportation Needs: Not on file  Physical Activity: Not on file  Stress: Not on file  Social Connections: Not on file  Intimate Partner Violence: Not on file    ROS Review of Systems  Constitutional:  Positive for fatigue. Negative for chills and fever.  HENT:  Negative for congestion, sinus pressure, sinus pain and sore throat.   Eyes:  Negative for pain and discharge.  Respiratory:  Negative for cough and shortness of breath.   Cardiovascular:  Negative for chest pain and palpitations.  Gastrointestinal:  Negative for abdominal pain, diarrhea, nausea and vomiting.  Endocrine: Negative for polydipsia and polyuria.  Genitourinary:  Negative for dysuria and hematuria.  Musculoskeletal:  Positive for arthralgias and myalgias. Negative for neck pain and neck stiffness.  Skin:  Negative for rash.  Neurological:  Negative for dizziness and weakness.  Psychiatric/Behavioral:  Negative for agitation and behavioral problems.    Objective:   Today's Vitals: BP (!) 147/69 (BP Location: Left Arm, Patient Position: Sitting, Cuff Size: Normal)   Pulse 81   Temp 98 F (36.7 C) (Oral)   Resp 18   Ht 5\' 6"  (1.676 m)   Wt 151 lb (68.5 kg)   SpO2 97%   BMI 24.37 kg/m   Physical Exam Vitals reviewed.  Constitutional:       General: She is not in acute distress.    Appearance: She is not diaphoretic.  HENT:     Head: Normocephalic and atraumatic.     Nose: Nose normal.     Mouth/Throat:     Mouth: Mucous membranes are moist.  Eyes:     General: No scleral icterus.    Extraocular Movements: Extraocular movements intact.  Neck:  Comments: Scar noted, site C/D/I Cardiovascular:     Rate and Rhythm: Normal rate and regular rhythm.     Pulses: Normal pulses.     Heart sounds: Normal heart sounds. No murmur heard. Pulmonary:     Breath sounds: Normal breath sounds. No wheezing or rales.  Musculoskeletal:     Cervical back: Neck supple. No tenderness.     Right lower leg: No edema.     Left lower leg: No edema.  Skin:    General: Skin is warm.     Findings: No rash.  Neurological:     General: No focal deficit present.     Mental Status: She is alert and oriented to person, place, and time.  Psychiatric:        Mood and Affect: Mood normal.        Behavior: Behavior normal.    Assessment & Plan:   Problem List Items Addressed This Visit       Encounter to establish care - Primary   Care established History and medications reviewed with the patient       Cardiovascular and Mediastinum   Benign hypertension    BP Readings from Last 1 Encounters:  07/03/21 (!) 147/69  Elevated today, but has not had Metoprolol today Overall remains well-controlled with Amlodipine and Metoprolol Counseled for compliance with the medications Advised DASH diet and moderate exercise/walking, at least 150 mins/week       Relevant Medications   metoprolol tartrate (LOPRESSOR) 25 MG tablet   amLODipine (NORVASC) 5 MG tablet     Endocrine   Type 2 diabetes mellitus with other specified complication (HCC)    Lab Results  Component Value Date   HGBA1C 5.9 (H) 12/25/2020  Diet controlled Was on medications in the past Does not prefer to take medications for DM or statin Diabetic foot exam:  Today Advised to follow up with Ophthalmology for diabetic eye exam.      Primary hypothyroidism    Was on NP thyroid Chart review suggests normal TSH DC NP thyroid Check TSH and free T4      Relevant Medications   metoprolol tartrate (LOPRESSOR) 25 MG tablet     Other   Mixed hyperlipidemia    Will check lipid profile later      Relevant Medications   metoprolol tartrate (LOPRESSOR) 25 MG tablet   amLODipine (NORVASC) 5 MG tablet         S/P subtotal parathyroidectomy ([place])    Has been having fatigue and myalgias lately She thinks her symptoms are due to recurrence of hyperparathyroidism - check CMP, intact PTH Followed by Endocrinology       Outpatient Encounter Medications as of 07/03/2021  Medication Sig   thyroid (NP THYROID) 60 MG tablet Take 1 tablet (60 mg total) by mouth daily before breakfast.   [DISCONTINUED] amLODipine (NORVASC) 5 MG tablet Take 1 tablet (5 mg total) by mouth daily.   [DISCONTINUED] estradiol (ESTRACE) 1 MG tablet Take 1 tablet (1 mg total) by mouth daily.   [DISCONTINUED] metoprolol tartrate (LOPRESSOR) 50 MG tablet Take 1 tablet (50 mg total) by mouth daily at 12 noon.   amLODipine (NORVASC) 5 MG tablet Take 1 tablet (5 mg total) by mouth daily.   metoprolol tartrate (LOPRESSOR) 25 MG tablet Take 1 tablet (25 mg total) by mouth 2 (two) times daily.   [DISCONTINUED] traMADol (ULTRAM) 50 MG tablet Take 1 tablet (50 mg total) by mouth every 6 (six) hours as needed. (Patient  not taking: Reported on 07/03/2021)   No facility-administered encounter medications on file as of 07/03/2021.    Follow-up: Return in about 4 months (around 11/03/2021) for HTN and DM.   Lindell Spar, MD

## 2021-07-03 NOTE — Telephone Encounter (Signed)
[name], Pt requesting a call back from you

## 2021-07-03 NOTE — Assessment & Plan Note (Addendum)
On NP thyroid Chart review suggests normal TSH Plan to down titrate NP thyroid and switch to Levothyroxine if abnormal TSH -followed by Endocrinology Check TSH and free T4

## 2021-07-03 NOTE — Assessment & Plan Note (Signed)
BP Readings from Last 1 Encounters:  07/03/21 (!) 147/69   Elevated today, but has not had Metoprolol today Overall remains well-controlled with Amlodipine and Metoprolol Counseled for compliance with the medications Advised DASH diet and moderate exercise/walking, at least 150 mins/week

## 2021-07-03 NOTE — Assessment & Plan Note (Signed)
Has been having fatigue and myalgias lately She thinks her symptoms are due to recurrence of hyperparathyroidism - check CMP, intact PTH Followed by Endocrinology

## 2021-07-04 LAB — T4, FREE: Free T4: 1.23 ng/dL (ref 0.82–1.77)

## 2021-07-04 LAB — COMPREHENSIVE METABOLIC PANEL
ALT: 29 IU/L (ref 0–32)
AST: 21 IU/L (ref 0–40)
Albumin/Globulin Ratio: 2.4 — ABNORMAL HIGH (ref 1.2–2.2)
Albumin: 4.4 g/dL (ref 3.7–4.7)
Alkaline Phosphatase: 70 IU/L (ref 44–121)
BUN/Creatinine Ratio: 22 (ref 12–28)
BUN: 19 mg/dL (ref 8–27)
Bilirubin Total: 0.4 mg/dL (ref 0.0–1.2)
CO2: 18 mmol/L — ABNORMAL LOW (ref 20–29)
Calcium: 8.4 mg/dL — ABNORMAL LOW (ref 8.7–10.3)
Chloride: 103 mmol/L (ref 96–106)
Creatinine, Ser: 0.85 mg/dL (ref 0.57–1.00)
Globulin, Total: 1.8 g/dL (ref 1.5–4.5)
Glucose: 161 mg/dL — ABNORMAL HIGH (ref 70–99)
Potassium: 4.8 mmol/L (ref 3.5–5.2)
Sodium: 141 mmol/L (ref 134–144)
Total Protein: 6.2 g/dL (ref 6.0–8.5)
eGFR: 73 mL/min/{1.73_m2} (ref >59)

## 2021-07-04 LAB — PTH, INTACT AND CALCIUM: PTH: 78 pg/mL — ABNORMAL HIGH (ref 15–65)

## 2021-07-04 LAB — THYROGLOBULIN ANTIBODY: Thyroglobulin Antibody: <1 IU/mL (ref 0.0–0.9)

## 2021-07-04 LAB — HEMOGLOBIN A1C
Est. average glucose Bld gHb Est-mCnc: 143 mg/dL
Hgb A1c MFr Bld: 6.6 % — ABNORMAL HIGH (ref 4.8–5.6)

## 2021-07-04 LAB — THYROID PEROXIDASE ANTIBODY: Thyroperoxidase Ab SerPl-aCnc: <8 IU/mL (ref 0–34)

## 2021-07-04 LAB — TSH: TSH: 2.93 u[IU]/mL (ref 0.450–4.500)

## 2021-07-04 LAB — T3, FREE: T3, Free: 4.4 pg/mL (ref 2.0–4.4)

## 2021-07-04 LAB — VITAMIN D 25 HYDROXY (VIT D DEFICIENCY, FRACTURES): Vit D, 25-Hydroxy: 18.7 ng/mL — ABNORMAL LOW (ref 30.0–100.0)

## 2021-07-04 NOTE — Patient Instructions (Addendum)
DUE TO COVID-19 ONLY ONE VISITOR IS ALLOWED TO COME WITH YOU AND STAY IN THE WAITING ROOM ONLY DURING PRE OP AND PROCEDURE.   **NO VISITORS ARE ALLOWED IN THE SHORT STAY AREA OR RECOVERY ROOM!!**  IF YOU WILL BE ADMITTED INTO THE HOSPITAL YOU ARE ALLOWED ONLY TWO SUPPORT PEOPLE DURING VISITATION HOURS ONLY (7 AM -8PM)    Up to two visitors ages 66+ are allowed at one in a patient's room.  The visitors may rotate out with other people throughout the day.  Additionally, up to two children between the ages of 93 and 29 are allowed and do not count toward the number of allowed visitors.  Children within this age range must be accompanied by an adult visitor.  One adult visitor may remain with the patient overnight and must be in the room by 8 PM.  COVID SWAB TESTING MUST BE COMPLETED ON:  07-24-21,  Between the hours of 8 and 3  **MUST PRESENT COMPLETED FORM AT TESTING SITE**    [facility] [place] [facility] (backside of the building)  You are not required to quarantine, however you are required to wear a well-fitted mask when you are out and around people not in your household.  Hand Hygiene often Do NOT share personal items Notify your provider if you are in close contact with someone who has COVID or you develop fever 100.4 or greater, new onset of sneezing, cough, sore throat, shortness of breath or body aches.        Your procedure is scheduled on:  Thursday, 07-26-21   Report to [facility] Main  Entrance    Report to admitting at 7:30 AM   Call this number if you have problems the morning of surgery [phone]   Do not eat food or drink liquids :After Midnight.   CLEAR LIQUID DIET  Foods Allowed                                                                     Foods Excluded  Water, Black Coffee (no milk/no creamer) and tea, regular and decaf                              liquids that you cannot  Plain Jell-O in any flavor  (No red)                         see  through such as: Fruit ices (not with fruit pulp)                                 milk, soups, orange juice  Iced Popsicles (No red)                                    All solid food                             Apple juices Sports drinks like Gatorade (No red) Lightly seasoned clear  broth or consume(fat free) Sugar     Oral Hygiene is also important to reduce your risk of infection.                                    Remember - BRUSH YOUR TEETH THE MORNING OF SURGERY WITH YOUR REGULAR TOOTHPASTE   Do NOT smoke after Midnight   Take these medicines the morning of surgery with A SIP OF WATER: Metoprolol, Amlodipine, Thyroid  DO NOT TAKE ANY ORAL DIABETIC MEDICATIONS DAY OF YOUR SURGERY                    Stop all vitamins and herbal supplements a week before surgery             You may not have any metal on your body including hair pins, jewelry, and body piercing             Do not wear make-up, lotions, powders, perfumes or deodorant  Do not wear nail polish including gel and S&S, artificial/acrylic nails, or any other type of covering on natural nails including finger and toenails. If you have artificial nails, gel coating, etc. that needs to be removed by a nail salon please have this removed prior to surgery or surgery may need to be canceled/ delayed if the surgeon/ anesthesia feels like they are unable to be safely monitored.   Do not shave  48 hours prior to surgery.               Do not bring valuables to the hospital. [place].   Contacts, dentures or bridgework may not be worn into surgery.   Bring small overnight bag day of surgery.   Special Instructions: Bring a copy of your healthcare power of attorney and living will documents the day of surgery if you haven't scanned them in before.  Please read over the following fact sheets you were given: IF YOU HAVE QUESTIONS ABOUT YOUR PRE OP INSTRUCTIONS PLEASE CALL [facility] - Preparing for Surgery Before surgery, you can play an important role.  Because skin is not sterile, your skin needs to be as free of germs as possible.  You can reduce the number of germs on your skin by washing with CHG (chlorahexidine gluconate) soap before surgery.  CHG is an antiseptic cleaner which kills germs and bonds with the skin to continue killing germs even after washing. Please DO NOT use if you have an allergy to CHG or antibacterial soaps.  If your skin becomes reddened/irritated stop using the CHG and inform your nurse when you arrive at Short Stay. Do not shave (including legs and underarms) for at least 48 hours prior to the first CHG shower.  You may shave your face/neck. Please follow these instructions carefully:  1.  Shower with CHG Soap the night before surgery and the  morning of Surgery.  2.  If you choose to wash your hair, wash your hair first as usual with your  normal  shampoo.  3.  After you shampoo, rinse your hair and body thoroughly to remove the  shampoo.                           4.  Use CHG as you  would any other liquid soap.  You can apply chg directly  to the skin and wash                       Gently with a scrungie or clean washcloth.  5.  Apply the CHG Soap to your body ONLY FROM THE NECK DOWN.   Do not use on face/ open                           Wound or open sores. Avoid contact with eyes, ears mouth and genitals (private parts).                       Wash face,  Genitals (private parts) with your normal soap.             6.  Wash thoroughly, paying special attention to the area where your surgery  will be performed.  7.  Thoroughly rinse your body with warm water from the neck down.  8.  DO NOT shower/wash with your normal soap after using and rinsing off  the CHG Soap.                9.  Pat yourself dry with a clean towel.            10.  Wear clean pajamas.            11.  Place clean sheets on your bed the night of your first  shower and do not  sleep with pets. Day of Surgery : Do not apply any lotions/deodorants the morning of surgery.  Please wear clean clothes to the hospital/surgery center.  FAILURE TO FOLLOW THESE INSTRUCTIONS MAY RESULT IN THE CANCELLATION OF YOUR SURGERY PATIENT SIGNATURE_________________________________  NURSE SIGNATURE__________________________________  ________________________________________________________________________     WHAT IS A BLOOD TRANSFUSION? Blood Transfusion Information  A transfusion is the replacement of blood or some of its parts. Blood is made up of multiple cells which provide different functions. Red blood cells carry oxygen and are used for blood loss replacement. White blood cells fight against infection. Platelets control bleeding. Plasma helps clot blood. Other blood products are available for specialized needs, such as hemophilia or other clotting disorders. BEFORE THE TRANSFUSION  Who gives blood for transfusions?  Healthy volunteers who are fully evaluated to make sure their blood is safe. This is blood bank blood. Transfusion therapy is the safest it has ever been in the practice of medicine. Before blood is taken from a donor, a complete history is taken to make sure that person has no history of diseases nor engages in risky social behavior (examples are intravenous drug use or sexual activity with multiple partners). The donor's travel history is screened to minimize risk of transmitting infections, such as malaria. The donated blood is tested for signs of infectious diseases, such as HIV and hepatitis. The blood is then tested to be sure it is compatible with you in order to minimize the chance of a transfusion reaction. If you or a relative donates blood, this is often done in anticipation of surgery and is not appropriate for emergency situations. It takes many days to process the donated blood. RISKS AND COMPLICATIONS Although transfusion therapy is  very safe and saves many lives, the main dangers of transfusion include:  Getting an infectious disease. Developing a transfusion reaction. This is an allergic reaction to something in the blood you  were given. Every precaution is taken to prevent this. The decision to have a blood transfusion has been considered carefully by your caregiver before blood is given. Blood is not given unless the benefits outweigh the risks. AFTER THE TRANSFUSION Right after receiving a blood transfusion, you will usually feel much better and more energetic. This is especially true if your red blood cells have gotten low (anemic). The transfusion raises the level of the red blood cells which carry oxygen, and this usually causes an energy increase. The nurse administering the transfusion will monitor you carefully for complications. HOME CARE INSTRUCTIONS  No special instructions are needed after a transfusion. You may find your energy is better. Speak with your caregiver about any limitations on activity for underlying diseases you may have. SEEK MEDICAL CARE IF:  Your condition is not improving after your transfusion. You develop redness or irritation at the intravenous (IV) site. SEEK IMMEDIATE MEDICAL CARE IF:  Any of the following symptoms occur over the next 12 hours: Shaking chills. You have a temperature by mouth above 102 F (38.9 C), not controlled by medicine. Chest, back, or muscle pain. People around you feel you are not acting correctly or are confused. Shortness of breath or difficulty breathing. Dizziness and fainting. You get a rash or develop hives. You have a decrease in urine output. Your urine turns a dark color or changes to pink, red, or brown. Any of the following symptoms occur over the next 10 days: You have a temperature by mouth above 102 F (38.9 C), not controlled by medicine. Shortness of breath. Weakness after normal activity. The white part of the eye turns yellow  (jaundice). You have a decrease in the amount of urine or are urinating less often. Your urine turns a dark color or changes to pink, red, or brown. Document Released: 09/13/2000 Document Revised: 12/09/2011 Document Reviewed: 05/02/2008 [facility] Patient Information 2014 [place], [place].  _______________________________________________________________________

## 2021-07-06 NOTE — Progress Notes (Addendum)
COVID swab appointment:  07-24-21  COVID Vaccine Completed:  Yes x3 Date COVID Vaccine completed: 11-24-19 12-22-19 Has received booster:  08-30-20 02-28-21 COVID vaccine manufacturer:    Moderna     Date of COVID positive in last 90 days:  N/A  PCP - Ihor Dow, MD Cardiologist - Carlyle Hadasah, MD  Chest x-ray - N/A EKG - 12-28-20 Epic Stress Test - 01-08-21 Epic ECHO - greater than 2 years, Epic Cardiac Cath - N/A Pacemaker/ICD device last checked: Spinal Cord Stimulator:  Sleep Study - N/A CPAP -   Fasting Blood Sugar - Does not check  Checks Blood Sugar _____ times a day  Blood Thinner Instructions: N/A Aspirin Instructions: Last Dose:  Activity level:  Can go up a flight of stairs and perform activities of daily living without stopping and without symptoms of chest pain or shortness of breath.    Anesthesia review:  Hx of chest pain and SOB, followed by cardiology.    Patient denies shortness of breath, fever, cough and chest pain at PAT appointment   Patient verbalized understanding of instructions that were given to them at the PAT appointment. Patient was also instructed that they will need to review over the PAT instructions again at home before surgery.

## 2021-07-09 ENCOUNTER — Encounter (HOSPITAL_COMMUNITY): Payer: Self-pay

## 2021-07-09 ENCOUNTER — Encounter (HOSPITAL_COMMUNITY)
Admission: RE | Admit: 2021-07-09 | Discharge: 2021-07-09 | Disposition: A | Discharge status: Home / Self Care | Payer: Medicare Other | Source: Ambulatory Visit | Attending: Urology | Admitting: Urology

## 2021-07-09 ENCOUNTER — Other Ambulatory Visit: Payer: Self-pay

## 2021-07-09 DIAGNOSIS — E119 Type 2 diabetes mellitus without complications: Secondary | ICD-10-CM | POA: Insufficient documentation

## 2021-07-09 DIAGNOSIS — Z01812 Encounter for preprocedural laboratory examination: Secondary | ICD-10-CM | POA: Insufficient documentation

## 2021-07-09 LAB — CBC
HCT: 44.7 % (ref 36.0–46.0)
Hemoglobin: 14.5 g/dL (ref 12.0–15.0)
MCH: 28.5 pg (ref 26.0–34.0)
MCHC: 32.4 g/dL (ref 30.0–36.0)
MCV: 88 fL (ref 80.0–100.0)
Platelets: 225 10*3/uL (ref 150–400)
RBC: 5.08 MIL/uL (ref 3.87–5.11)
RDW: 14.4 % (ref 11.5–15.5)
WBC: 6.4 10*3/uL (ref 4.0–10.5)
nRBC: 0 % (ref 0.0–0.2)

## 2021-07-09 LAB — GLUCOSE, CAPILLARY: Glucose-Capillary: 150 mg/dL — ABNORMAL HIGH (ref 70–99)

## 2021-07-10 ENCOUNTER — Encounter: Payer: Self-pay | Admitting: Nurse Practitioner

## 2021-07-10 ENCOUNTER — Ambulatory Visit (INDEPENDENT_AMBULATORY_CARE_PROVIDER_SITE_OTHER): Payer: Medicare Other | Admitting: Nurse Practitioner

## 2021-07-10 VITALS — BP 136/60 | HR 76 | Ht 66.0 in | Wt 149.0 lb

## 2021-07-10 DIAGNOSIS — E039 Hypothyroidism, unspecified: Secondary | ICD-10-CM | POA: Diagnosis not present

## 2021-07-10 DIAGNOSIS — E119 Type 2 diabetes mellitus without complications: Secondary | ICD-10-CM | POA: Diagnosis not present

## 2021-07-10 DIAGNOSIS — D351 Benign neoplasm of parathyroid gland: Secondary | ICD-10-CM | POA: Diagnosis not present

## 2021-07-10 DIAGNOSIS — E559 Vitamin D deficiency, unspecified: Secondary | ICD-10-CM

## 2021-07-10 LAB — POCT UA - MICROALBUMIN
Albumin/Creatinine Ratio, Urine, POC: <30
Creatinine, POC: 200 mg/dL
Microalbumin Ur, POC: 30 mg/L

## 2021-07-10 MED ORDER — LEVOTHYROXINE SODIUM 100 MCG PO TABS: 100.0000 ug | ORAL_TABLET | Freq: Every day | ORAL | 1 refills | Status: DC

## 2021-07-10 MED ORDER — VITAMIN D (ERGOCALCIFEROL) 1.25 MG (50000 UNIT) PO CAPS: 50000.0000 [IU] | ORAL_CAPSULE | ORAL | 0 refills | Status: DC

## 2021-07-10 NOTE — Patient Instructions (Signed)

## 2021-07-10 NOTE — Progress Notes (Signed)
Endocrinology Follow Up Note                                         07/10/2021, 11:26 AM  Subjective:   Subjective    [name] is a 70 y.o.-year-old female patient being seen in follow up after being seen in consultation for hypothyroidism, diabetes, hx parathyroid adenoma referred by Lindell Spar, MD.   Past Medical History:  Diagnosis Date   Arthritis    Asthma    takes Singulair nightly   Diabetes mellitus    takes levemir daily and Metformin daily   Generalized headaches    GERD (gastroesophageal reflux disease)    takes Omeprazole daily   Herniated disc, cervical    History of kidney stones    Hyperlipidemia    takes Welchol daily   Hypertension    takes Enalapril dialy   Hypothyroidism    takes Synthroid daily   IBS (irritable bowel syndrome)    Joint pain    hands and neck   Kidney stones    Nocturia    PONV (postoperative nausea and vomiting)    TIA (transient ischemic attack)    diagnosed by EEG but never knew she had it   Trigger finger    Weakness    Weakness    in both hands    Past Surgical History:  Procedure Laterality Date   ABDOMINAL HYSTERECTOMY  1980   ANTERIOR CERVICAL DECOMP/DISCECTOMY FUSION N/A 10/18/2013   Procedure: Cervical three-four, Cervical four-five, Cervical seven-Thoracic one anterior cervical decompression with fusion plating and bonegraft;  Surgeon: Hosie Spangle, MD;  Location: [facility];  Service: Neurosurgery;  Laterality: N/A;  Cervical three-four, Cervical four-five, Cervical seven-Thoracic one anterior cervical decompression with fusion plating and bonegraft   APPENDECTOMY     done with hysterectomy   BACK SURGERY  2007   lumbar fusion   carpel tunnel Bilateral    left first then right   cataract implants     COLONOSCOPY     ESOPHAGOGASTRODUODENOSCOPY     hx of h pylori   EYE SURGERY  11/18/2008   right - cataracts   EYE SURGERY   06/01/2009   left - cataracts   herniated disc   10/2000, 08/31/2008   C5-C6 then C6-C7   knee anthroscopy bilateral Bilateral    KNEE ARTHROSCOPY     LITHOTRIPSY  11/12/2007   right side   Parathyroid surgery     October 2013   PARATHYROIDECTOMY  07/02/2012   Procedure: PARATHYROIDECTOMY;  Surgeon: Earnstine Regal, MD;  Location: [facility];  Service: General;  Laterality: N/A;   TONSILLECTOMY     as a child    Social History   Socioeconomic History   Marital status: Widowed    Spouse name: Not on file   Number of children: Not on file   Years of education: Not on file   Highest education level: Not on file  Occupational History   Not on file  Tobacco Use   Smoking status: Former    Packs/day: 1.00    Years: 40.00    Pack years: 40.00    Types: Cigarettes    Quit date: 07/19/2012    Years since quitting: 8.9   Smokeless tobacco: Never   Tobacco comments:    quit smoking in July 21, 2012  Vaping Use   Vaping Use: Never used  Substance and Sexual Activity   Alcohol use: No    Alcohol/week: 0.0 standard drinks   Drug use: No   Sexual activity: Not on file  Other Topics Concern   Not on file  Social History Narrative   Widow,husband died 07-22-19 after 55 years marriage.Retired.   Social Determinants of Health   Financial Resource Strain: Not on file  Food Insecurity: Not on file  Transportation Needs: Not on file  Physical Activity: Not on file  Stress: Not on file  Social Connections: Not on file    Family History  Problem Relation Age of Onset   Cancer Mother        pancreatic    Outpatient Encounter Medications as of 07/10/2021  Medication Sig   levothyroxine (SYNTHROID) 100 MCG tablet Take 1 tablet (100 mcg total) by mouth daily before breakfast.   Vitamin D, Ergocalciferol, (DRISDOL) 1.25 MG (50000 UNIT) CAPS capsule Take 1 capsule (50,000 Units total) by mouth every 7 (seven) days.   amLODipine (NORVASC) 5 MG tablet Take 1 tablet (5 mg total) by mouth  daily.   metoprolol tartrate (LOPRESSOR) 25 MG tablet Take 1 tablet (25 mg total) by mouth 2 (two) times daily.   [DISCONTINUED] tetrahydrozoline-zinc (VISINE-AC) 0.05-0.25 % ophthalmic solution Place 2 drops into both eyes 3 (three) times daily as needed (allergies).   [DISCONTINUED] thyroid (ARMOUR) 60 MG tablet Take 60 mg by mouth daily before breakfast.   No facility-administered encounter medications on file as of 07/10/2021.    ALLERGIES: Allergies  Allergen Reactions   Advair Diskus [Fluticasone-Salmeterol] Anaphylaxis   Codeine Nausea And Vomiting   Hydrocodone-Acetaminophen Nausea And Vomiting   Oxycodone-Acetaminophen Nausea And Vomiting   Tetracyclines & Related Itching    Internal   VACCINATION STATUS: Immunization History  Administered Date(s) Administered   Fluad Quad(high Dose 65+) 08/17/2019   Influenza, High Dose Seasonal PF 07/14/2020   Moderna Sars-Covid-2 Vaccination 11/24/2019, 12/22/2019, 08/30/2020, 02/28/2021   Tdap 07/12/2014     Diabetes She presents for her follow-up diabetic visit. She has type 2 diabetes mellitus. Her disease course has been fluctuating. There are no hypoglycemic associated symptoms. Associated symptoms include fatigue. There are no hypoglycemic complications. Symptoms are stable. There are no diabetic complications. Risk factors for coronary artery disease include diabetes mellitus, stress, post-menopausal and hypertension. Current diabetic treatment includes diet. She is compliant with treatment most of the time. Her weight is fluctuating minimally. She is following a generally healthy diet. Meal planning includes avoidance of concentrated sweets. She has not had a previous visit with a dietitian. She participates in exercise intermittently. An ACE inhibitor/angiotensin II receptor blocker is not being taken. She does not see a podiatrist.Eye exam is current.   [name] is a patient with the above medical history. she was diagnosed  with parathyroid adenoma in 2013 which required parathyroidectomy done by Dr. Armandina Gemma.  She is now experiencing similar symptoms that she had at that time and would like to investigate whether or not there could be a problem with one of her other parathyroid glands.  She thinks she remembers  being started on thyroid hormone replacement after her parathyroid removal due to possible injury to the thyroid gland during its removal.  She was a patient of Dr. Anastasio Champion who had started the patient on NP thyroid likely as a result of accidental injury to the thyroid tissue during parathyroidectomy surgery   she was given various doses of NP thyroid over the years, currently on 60 mg. she reports compliance to this medication:  Taking it daily on empty stomach  with water, separated by >30 minutes before breakfast and other medications, and by at least 4 hours from calcium, iron, PPIs, multivitamins .  I reviewed patient's thyroid tests:  Lab Results  Component Value Date   TSH 2.930 07/03/2021   TSH 0.57 10/14/2019   TSH 0.15 (L) 06/29/2019   TSH 2.39 06/23/2018   TSH 2.66 04/16/2017   TSH 1.98 10/14/2016   TSH 4.37 04/04/2016   TSH 3.52 01/04/2016   TSH 1.619 09/29/2015   FREET4 1.23 07/03/2021   FREET4 1.4 06/23/2018   FREET4 1.4 04/16/2017   FREET4 1.5 10/14/2016   FREET4 1.1 04/04/2016   FREET4 1.5 01/04/2016   FREET4 1.15 09/29/2015     Pt describes: - kidney stone - fatigue - body aches   Pt denies feeling nodules in neck, hoarseness,  SOB with lying down.  She does report intermittent trouble swallowing pills, something new.  she denies family history of thyroid disorders.  No family history of thyroid cancer.  No history of radiation therapy to head or neck.  No recent use of iodine supplements.  Denies use of Biotin containing supplements.  I reviewed her chart and she also has a history of parathyroid adenoma, controlled Diabetes without medications, GERD,  HTN.   ROS:  Constitutional: no weight gain/loss, + fatigue, no subjective hyperthermia, no subjective hypothermia Eyes: no blurry vision, no xerophthalmia ENT: no sore throat, no nodules palpated in throat, no dysphagia/odynophagia, no hoarseness Cardiovascular: no chest pain, no SOB, no palpitations, no leg swelling Respiratory: no cough, no SOB Gastrointestinal: no nausea/vomiting/diarrhea Genitourinary: has large kidney stone needing surgical removal soon (next week) Musculoskeletal: diffuse muscle/joint aches Skin: no rashes Neurological: no tremors, no numbness, no tingling, no dizziness Psychiatric: no depression, no anxiety   Objective:   Objective     BP 136/60   Pulse 76   Ht 5\' 6"  (1.676 m)   Wt 149 lb (67.6 kg)   BMI 24.05 kg/m  Wt Readings from Last 3 Encounters:  07/10/21 149 lb (67.6 kg)  07/09/21 148 lb 6.4 oz (67.3 kg)  07/03/21 151 lb (68.5 kg)    BP Readings from Last 3 Encounters:  07/10/21 136/60  07/09/21 136/64  07/03/21 (!) 147/69      Physical Exam- Limited  Constitutional:  Body mass index is 24.05 kg/m. , not in acute distress, normal state of mind Eyes:  EOMI, no exophthalmos Neck: Supple Cardiovascular: RRR, no murmurs, rubs, or gallops, no edema Respiratory: Adequate breathing efforts, no crackles, rales, rhonchi, or wheezing Musculoskeletal: no gross deformities, strength intact in all four extremities, no gross restriction of joint movements Skin:  no rashes, no hyperemia Neurological: no tremor with outstretched hands   CMP ( most recent) CMP     Component Value Date/Time   NA 141 07/03/2021 0918   K 4.8 07/03/2021 0918   CL 103 07/03/2021 0918   CO2 18 (L) 07/03/2021 0918   GLUCOSE 161 (H) 07/03/2021 0918   GLUCOSE 142 (H) 05/30/2021 2126   BUN  19 07/03/2021 0918   CREATININE 0.85 07/03/2021 0918   CREATININE 0.81 12/25/2020 1034   CALCIUM 8.4 (L) 07/03/2021 0918   PROT 6.2 07/03/2021 0918   ALBUMIN 4.4 07/03/2021  0918   AST 21 07/03/2021 0918   ALT 29 07/03/2021 0918   ALKPHOS 70 07/03/2021 0918   BILITOT 0.4 07/03/2021 0918   GFRNONAA >60 05/30/2021 2126   GFRNONAA 73 12/25/2020 1034   GFRAA 85 12/25/2020 1034     Diabetic Labs (most recent): Lab Results  Component Value Date   HGBA1C 6.6 (H) 07/03/2021   HGBA1C 5.9 (H) 12/25/2020   HGBA1C 5.6 08/21/2020     Lipid Panel ( most recent) Lipid Panel     Component Value Date/Time   CHOL 150 12/25/2020 1034   TRIG 177 (H) 12/25/2020 1034   HDL 38 (L) 12/25/2020 1034   CHOLHDL 3.9 12/25/2020 1034   VLDL 33 (H) 10/14/2016 0721   LDLCALC 85 12/25/2020 1034       Lab Results  Component Value Date   TSH 2.930 07/03/2021   TSH 0.57 10/14/2019   TSH 0.15 (L) 06/29/2019   TSH 2.39 06/23/2018   TSH 2.66 04/16/2017   TSH 1.98 10/14/2016   TSH 4.37 04/04/2016   TSH 3.52 01/04/2016   TSH 1.619 09/29/2015   FREET4 1.23 07/03/2021   FREET4 1.4 06/23/2018   FREET4 1.4 04/16/2017   FREET4 1.5 10/14/2016   FREET4 1.1 04/04/2016   FREET4 1.5 01/04/2016   FREET4 1.15 09/29/2015    US Thyroid 06/26/21 CLINICAL DATA:  History of parathyroid adenoma.   EXAM: THYROID ULTRASOUND   TECHNIQUE: Ultrasound examination of the thyroid gland and adjacent soft tissues was performed.   COMPARISON:  04/23/2011   FINDINGS: Parenchymal Echotexture: Mildly heterogenous   Isthmus: 0.1 cm, previously 0.2 cm   Right lobe: 4.6 x 1.9 x 1.4 cm, previously 4.1 x 1.4 x 1.4 cm   Left lobe: 3.1 x 1.0 x 1.1 cm, previously 3.9 x 1.5 x 1.3 cm   _________________________________________________________   Estimated total number of nodules >/= 1 cm: 0   Number of spongiform nodules >/=  2 cm not described below (TR1): 0   Number of mixed cystic and solid nodules >/= 1.5 cm not described below ([place]): 0   _________________________________________________________   No discrete nodules. Previously there was a nodule in the left thyroid lobe that is no  longer present.   IMPRESSION: Thyroid tissue is mildly heterogeneous without discrete thyroid nodules.     Electronically Signed   By: Markus Daft M.D.   On: 06/26/2021 11:17  Assessment & Plan:   ASSESSMENT / PLAN:  1. History of parathyroid adenoma- s/p parathyroidectomy  -Her previsit calcium levels were low at 8.4 and associated PTH was 78, therefore indicating it is not likely her parathyroid causing the problem.  She does have low vitamin D which may be contributing to those levels as well.  2. Hypothyroidism- unspecified   Patient with long-standing hypothyroidism, on NP thyroid 60 mg.  I did go back in chart review and I believe she was on thyroid hormone replacement even before she had her parathyroidectomy. Her previsit thyroid function tests show stable thyroid function and antibody testing was negative ruling out autoimmune thyroid dysfunction.   We did discuss still switching to Levothyroxine 100 mcg po daily before breakfast and she agrees -will start it after her kidney stone removal surgery(says she thinks she may have had an issue with that medication in the  past but is willing to try it.   Her thyroid ultrasound shows mildly heterogeneous thyroid tissue without discrete thyroid nodules.    - We discussed about correct intake of levothyroxine, at fasting, with water, separated by at least 30 minutes from breakfast, and separated by more than 4 hours from calcium, iron, multivitamins, acid reflux medications (PPIs). -Patient is made aware of the fact that thyroid hormone replacement is needed for life, dose to be adjusted by periodic monitoring of thyroid function tests.  3. Controlled Diabetes  Her previsit A1c was 6.6%, increasing from back in 12/25/20 when it was 5.9%.  She is not currently on any medications for her diabetes, she is able to control it by lifestyle modification alone.  She prefers not to start on any medication and promises to do better with her diet and  exercise.  - Nutritional counseling repeated at each appointment due to patients tendency to fall back in to old habits.  - The patient admits there is a room for improvement in their diet and drink choices. -  Suggestion is made for the patient to avoid simple carbohydrates from their diet including Cakes, Sweet Desserts / Pastries, Ice Cream, Soda (diet and regular), Sweet Tea, Candies, Chips, Cookies, Sweet Pastries, Store Bought Juices, Alcohol in Excess of 1-2 drinks a day, Artificial Sweeteners, Coffee Creamer, and "Sugar-free" Products. This will help patient to have stable blood glucose profile and potentially avoid unintended weight gain.   - I encouraged the patient to switch to unprocessed or minimally processed complex starch and increased protein intake (animal or plant source), fruits, and vegetables.   - Patient is advised to stick to a routine mealtimes to eat 3 meals a day and avoid unnecessary snacks (to snack only to correct hypoglycemia).  4. Vitamin D deficiency Her most recent vitamin D level was 18.7 on 07/03/21.  She reportedly does not tolerate OTC vitamin D, causes upset stomach and diarrhea.  I did initiate replenishment with Ergocalciferol 50000 units PO weekly x 12 weeks.  She will start this after she has her surgery for kidney stone.     I spent 40 minutes in the care of the patient today including review of labs from [facility], Lipids, Thyroid Function, Hematology (current and previous including abstractions from other facilities); face-to-face time discussing  her blood glucose readings/logs, discussing hypoglycemia and hyperglycemia episodes and symptoms, medications doses, her options of short and long term treatment based on the latest standards of care / guidelines;  discussion about incorporating lifestyle medicine;  and documenting the encounter.    Please refer to Patient Instructions for Blood Glucose Monitoring and Insulin/Medications Dosing Guide"  in media tab  for additional information. Please  also refer to " Patient Self Inventory" in the Media  tab for reviewed elements of pertinent patient history.  Elizabeth S Sheperd participated in the discussions, expressed understanding, and voiced agreement with the above plans.  All questions were answered to her satisfaction. she is encouraged to contact clinic should she have any questions or concerns prior to her return visit.   FOLLOW UP PLAN:  Return in about 3 months (around 10/10/2021) for Diabetes F/U with A1c in office, Thyroid follow up, Previsit labs.   Rayetta Pigg, [facility] [address] [place], [place] [address] Phone: [phone] Fax: [phone]  07/10/2021, 11:26 AM

## 2021-07-11 ENCOUNTER — Ambulatory Visit: Payer: Medicare Other | Admitting: Nurse Practitioner

## 2021-07-16 DIAGNOSIS — N200 Calculus of kidney: Secondary | ICD-10-CM | POA: Diagnosis not present

## 2021-07-16 DIAGNOSIS — R8271 Bacteriuria: Secondary | ICD-10-CM | POA: Diagnosis not present

## 2021-07-17 ENCOUNTER — Other Ambulatory Visit: Payer: Self-pay

## 2021-07-17 ENCOUNTER — Telehealth: Payer: Self-pay | Admitting: Nurse Practitioner

## 2021-07-17 DIAGNOSIS — E119 Type 2 diabetes mellitus without complications: Secondary | ICD-10-CM

## 2021-07-17 MED ORDER — BLOOD GLUCOSE MONITOR KIT: PACK | 0 refills | Status: DC

## 2021-07-17 NOTE — Progress Notes (Signed)
Anesthesia Chart Review   Case: [id number] Date/Time: 07/26/21 1215   Procedures:      NEPHROLITHOTOMY PERCUTANEOUS (Right)     HOLMIUM LASER APPLICATION (Right)   Anesthesia type: General   Pre-op diagnosis: RIGHT RENAL STONES   Location: [facility]   Surgeons: Franchot Gallo, MD       DISCUSSION:70 y.o. former smoker with h/o PONV, GERD, DM II (A1C 6.6), hypothyroidism, HTN, right renal stones scheduled for above procedure 07/26/2021 with Dr. Franchot Gallo.   Low risk stress test 01/08/21.   Anticipate pt can proceed with planned procedure barring acute status change.   VS: BP 136/64   Pulse 65   Temp 36.6 C (Oral)   Resp 16   Ht _0  (1.676 m)   Wt 67.3 kg   SpO2 99%   BMI 23.95 kg/m   PROVIDERS: Lindell Spar, MD is PCP   Carlyle Alayshia, MD is Cardiologist  LABS: Labs reviewed: Acceptable for surgery. (all labs ordered are listed, but only abnormal results are displayed)  Labs Reviewed  GLUCOSE, CAPILLARY - Abnormal; Notable for the following components:      Result Value   Glucose-Capillary 150 (*)    All other components within normal limits  CBC     IMAGES:   EKG: 12/28/2020 Rate 74 bpm  NSR  CV: Stress Test 01/08/21 Blood pressure demonstrated a hypertensive response to exercise. The study is normal. This is a low risk study. The left ventricular ejection fraction is hyperdynamic (>65%).   Baseline ECG with inferolateral T wave changes Normal perfusion study with no ischemia or infarct EF 87%    Echo 11/22/2015 - Left ventricle: The cavity size was normal. Wall thickness was    increased in a pattern of mild LVH. Systolic function was    vigorous. The estimated ejection fraction was in the range of 65%    to 70%. Wall motion was normal; there were no regional wall    motion abnormalities. Doppler parameters are consistent with    abnormal left ventricular relaxation (grade 1 diastolic    dysfunction).  - Right  atrium: Central venous pressure (est): 3 mm Hg.  - Atrial septum: No defect or patent foramen ovale was identified.  - Tricuspid valve: There was trivial regurgitation.  - Pulmonary arteries: PA peak pressure: 21 mm Hg (S).  - Pericardium, extracardiac: There was no pericardial effusion. Past Medical History:  Diagnosis Date   Arthritis    Asthma    takes Singulair nightly   Diabetes mellitus    takes levemir daily and Metformin daily   Generalized headaches    GERD (gastroesophageal reflux disease)    takes Omeprazole daily   Herniated disc, cervical    History of kidney stones    Hyperlipidemia    takes Welchol daily   Hypertension    takes Enalapril dialy   Hypothyroidism    takes Synthroid daily   IBS (irritable bowel syndrome)    Joint pain    hands and neck   Kidney stones    Nocturia    PONV (postoperative nausea and vomiting)    TIA (transient ischemic attack)    diagnosed by EEG but never knew she had it   Trigger finger    Weakness    Weakness    in both hands    Past Surgical History:  Procedure Laterality Date   ABDOMINAL HYSTERECTOMY  1980   ANTERIOR CERVICAL DECOMP/DISCECTOMY FUSION N/A 10/18/2013  Procedure: Cervical three-four, Cervical four-five, Cervical seven-Thoracic one anterior cervical decompression with fusion plating and bonegraft;  Surgeon: Hosie Spangle, MD;  Location: [facility];  Service: Neurosurgery;  Laterality: N/A;  Cervical three-four, Cervical four-five, Cervical seven-Thoracic one anterior cervical decompression with fusion plating and bonegraft   APPENDECTOMY     done with hysterectomy   BACK SURGERY  2007   lumbar fusion   carpel tunnel Bilateral    left first then right   cataract implants     COLONOSCOPY     ESOPHAGOGASTRODUODENOSCOPY     hx of h pylori   EYE SURGERY  11/18/2008   right - cataracts   EYE SURGERY  06/01/2009   left - cataracts   herniated disc   10/2000, 08/31/2008   C5-C6 then C6-C7   knee anthroscopy  bilateral Bilateral    KNEE ARTHROSCOPY     LITHOTRIPSY  11/12/2007   right side   Parathyroid surgery     October 2013   PARATHYROIDECTOMY  07/02/2012   Procedure: PARATHYROIDECTOMY;  Surgeon: Earnstine Regal, MD;  Location: [facility];  Service: General;  Laterality: N/A;   TONSILLECTOMY     as a child    MEDICATIONS:  amLODipine (NORVASC) 5 MG tablet   blood glucose meter kit and supplies KIT   levothyroxine (SYNTHROID) 100 MCG tablet   metoprolol tartrate (LOPRESSOR) 25 MG tablet   Vitamin D, Ergocalciferol, (DRISDOL) 1.25 MG (50000 UNIT) CAPS capsule   No current facility-administered medications for this encounter.     Konrad Felix Ward, PA-C [facility] [phone]

## 2021-07-17 NOTE — Telephone Encounter (Signed)
Pt called and was advised by Whitney to check her sugar for a couple days and see how her readings are, patient states she can not find her meter or testing supplies and will need a kit sent in. No preference on brand.  [place], [place] Phone:  [phone]  Fax:  [phone]

## 2021-07-23 ENCOUNTER — Telehealth: Payer: Self-pay | Admitting: Internal Medicine

## 2021-07-23 ENCOUNTER — Telehealth: Payer: Self-pay

## 2021-07-23 NOTE — Telephone Encounter (Signed)
Patient called lvm the blood pressure medicine that Dr Posey Pronto put her on she said her numbers are higher.  She is having surgery this Thursday and she is concern about the blood pressure before going into surgery. Can you give patient a call [phone].

## 2021-07-23 NOTE — Telephone Encounter (Signed)
Sent betsy a message on pt to schedule in office appointment. We cannot advise on bp without seeing her

## 2021-07-23 NOTE — Telephone Encounter (Signed)
Pt will need an appointment in office before we can advise to increase bp medication please schedule in office appt

## 2021-07-23 NOTE — Telephone Encounter (Signed)
PT is calling to see if she can increase her BP medication --please call her

## 2021-07-24 ENCOUNTER — Telehealth: Payer: Self-pay | Admitting: *Deleted

## 2021-07-24 ENCOUNTER — Other Ambulatory Visit: Payer: Self-pay | Admitting: Urology

## 2021-07-24 LAB — SARS CORONAVIRUS 2 (TAT 6-24 HRS): SARS Coronavirus 2: NEGATIVE

## 2021-07-24 NOTE — Telephone Encounter (Signed)
Received call from patient concerning her BP. She is scheduled for surgery this Thursday to remove a Kidney stone. BP over the last week has been running 159/66, 154/67, 154/68 140/65 (which was the lowest) and Monday morning it was 161/79. She is checking her readings at various times. Still taking the Metoprolol 25mg  one in the AM and one in the PM as directed. She would like to know if for the time being she can go back to taking Metoprolol 50mg  in the morning which is what she was previously on with Dr. Anastasio Champion.

## 2021-07-24 NOTE — Telephone Encounter (Signed)
Called and notified patient of recommendations. She verbalized understanding.

## 2021-07-25 ENCOUNTER — Other Ambulatory Visit: Payer: Self-pay | Admitting: Internal Medicine

## 2021-07-25 NOTE — H&P (Signed)
H&P  Chief Complaint: Right-sided kidney stones  History of Present Illness: [name] is a 70 y.o. year old female presenting at this time for percutaneous management of right renal calculi.  She has a dominant 20.5 mm right renal pelvic stone as well as upper and lower calyceal stones in the ipsilateral kidney.  I have discussed percutaneous management of these stones with the patient.  Risks and complications have also been discussed as well as alternatives to this procedure.  She desires to proceed.  Past Medical History:  Diagnosis Date   Arthritis    Asthma    takes Singulair nightly   Diabetes mellitus    takes levemir daily and Metformin daily   Generalized headaches    GERD (gastroesophageal reflux disease)    takes Omeprazole daily   Herniated disc, cervical    History of kidney stones    Hyperlipidemia    takes Welchol daily   Hypertension    takes Enalapril dialy   Hypothyroidism    takes Synthroid daily   IBS (irritable bowel syndrome)    Joint pain    hands and neck   Kidney stones    Nocturia    PONV (postoperative nausea and vomiting)    TIA (transient ischemic attack)    diagnosed by EEG but never knew she had it   Trigger finger    Weakness    Weakness    in both hands    Past Surgical History:  Procedure Laterality Date   ABDOMINAL HYSTERECTOMY  1980   ANTERIOR CERVICAL DECOMP/DISCECTOMY FUSION N/A 10/18/2013   Procedure: Cervical three-four, Cervical four-five, Cervical seven-Thoracic one anterior cervical decompression with fusion plating and bonegraft;  Surgeon: Hosie Spangle, MD;  Location: [facility];  Service: Neurosurgery;  Laterality: N/A;  Cervical three-four, Cervical four-five, Cervical seven-Thoracic one anterior cervical decompression with fusion plating and bonegraft   APPENDECTOMY     done with hysterectomy   BACK SURGERY  2007   lumbar fusion   carpel tunnel Bilateral    left first then right   cataract implants      COLONOSCOPY     ESOPHAGOGASTRODUODENOSCOPY     hx of h pylori   EYE SURGERY  11/18/2008   right - cataracts   EYE SURGERY  06/01/2009   left - cataracts   herniated disc   10/2000, 08/31/2008   C5-C6 then C6-C7   knee anthroscopy bilateral Bilateral    KNEE ARTHROSCOPY     LITHOTRIPSY  11/12/2007   right side   Parathyroid surgery     October 2013   PARATHYROIDECTOMY  07/02/2012   Procedure: PARATHYROIDECTOMY;  Surgeon: Earnstine Regal, MD;  Location: [facility];  Service: General;  Laterality: N/A;   TONSILLECTOMY     as a child    Home Medications:  No medications prior to admission.    Allergies:  Allergies  Allergen Reactions   Advair Diskus [Fluticasone-Salmeterol] Anaphylaxis   Codeine Nausea And Vomiting   Hydrocodone-Acetaminophen Nausea And Vomiting   Oxycodone-Acetaminophen Nausea And Vomiting   Tetracyclines & Related Itching    Internal    Family History  Problem Relation Age of Onset   Cancer Mother        pancreatic    Social History:  reports that she quit smoking about 9 years ago. Her smoking use included cigarettes. She has a 40.00 pack-year smoking history. She has never used smokeless tobacco. She reports that she does not drink alcohol and does  not use drugs.  ROS: A complete review of systems was performed.  All systems are negative except for pertinent findings as noted.  Physical Exam:  Vital signs in last 24 hours:   General:  Alert and oriented, No acute distress HEENT: Normocephalic, atraumatic Neck: No JVD or lymphadenopathy Cardiovascular: Regular rate  Lungs: Normal inspiratory/expiratory excursion Abdomen: Soft, nontender, nondistended, no abdominal masses Back: No CVA tenderness Extremities: No edema Neurologic: Grossly intact  Laboratory Data:  No results found for this or any previous visit (from the past 24 hour(s)). Recent Results (from the past 240 hour(s))  SARS Coronavirus 2 (TAT 6-24 hrs)     Status: None   Collection Time:  07/24/21 12:00 AM  Result Value Ref Range Status   SARS Coronavirus 2 RESULT: NEGATIVE  Final    Comment: RESULT: NEGATIVESARS-CoV-2 INTERPRETATION:A NEGATIVE  test result means that SARS-CoV-2 RNA was not present in the specimen above the limit of detection of this test. This does not preclude a possible SARS-CoV-2 infection and should not be used as the  sole basis for patient management decisions. Negative results must be combined with clinical observations, patient history, and epidemiological information. Optimum specimen types and timing for peak viral levels during infections caused by SARS-CoV-2  have not been determined. Collection of multiple specimens or types of specimens may be necessary to detect virus. Improper specimen collection and handling, sequence variability under primers/probes, or organism present below the limit of detection may  lead to false negative results. Positive and negative predictive values of testing are highly dependent on prevalence. False negative test results are more likely when prevalence of disease is high.The expected result is NEGATIVE.Fact S heet for  Healthcare Providers: LocalChronicle.no Sheet for Patients: SalonLookup.es Reference Range - Negative    Creatinine: No results for input(s): CREATININE in the last 168 hours.  Radiologic Imaging: No results found.  Impression/Assessment:  Right renal calculi  Plan:  Right percutaneous nephrolithotomy  [name] 07/25/2021, 6:07 PM  Brandi Boxer. Brandi Mcburney MD

## 2021-07-26 ENCOUNTER — Ambulatory Visit (HOSPITAL_COMMUNITY): Payer: Medicare Other

## 2021-07-26 ENCOUNTER — Encounter (HOSPITAL_COMMUNITY)
Admission: RE | Disposition: A | Discharge status: Home / Self Care | Payer: Self-pay | Source: Home / Self Care | Attending: Urology

## 2021-07-26 ENCOUNTER — Ambulatory Visit (HOSPITAL_COMMUNITY): Payer: Medicare Other | Admitting: Physician Assistant

## 2021-07-26 ENCOUNTER — Ambulatory Visit (HOSPITAL_COMMUNITY)
Admission: RE | Admit: 2021-07-26 | Discharge: 2021-07-26 | Disposition: A | Discharge status: Home / Self Care | Payer: Medicare Other | Source: Ambulatory Visit | Attending: Urology | Admitting: Urology

## 2021-07-26 ENCOUNTER — Other Ambulatory Visit: Payer: Self-pay

## 2021-07-26 ENCOUNTER — Encounter (HOSPITAL_COMMUNITY): Payer: Self-pay | Admitting: Urology

## 2021-07-26 ENCOUNTER — Observation Stay (HOSPITAL_COMMUNITY)
Admission: RE | Admit: 2021-07-26 | Discharge: 2021-07-27 | Disposition: A | Discharge status: Home / Self Care | Payer: Medicare Other | Attending: Urology | Admitting: Urology

## 2021-07-26 ENCOUNTER — Ambulatory Visit (HOSPITAL_COMMUNITY): Payer: Medicare Other | Admitting: Anesthesiology

## 2021-07-26 DIAGNOSIS — N200 Calculus of kidney: Secondary | ICD-10-CM | POA: Insufficient documentation

## 2021-07-26 DIAGNOSIS — Z87891 Personal history of nicotine dependence: Secondary | ICD-10-CM | POA: Insufficient documentation

## 2021-07-26 DIAGNOSIS — E782 Mixed hyperlipidemia: Secondary | ICD-10-CM | POA: Diagnosis not present

## 2021-07-26 DIAGNOSIS — I10 Essential (primary) hypertension: Secondary | ICD-10-CM | POA: Diagnosis not present

## 2021-07-26 DIAGNOSIS — J45909 Unspecified asthma, uncomplicated: Secondary | ICD-10-CM | POA: Insufficient documentation

## 2021-07-26 DIAGNOSIS — E119 Type 2 diabetes mellitus without complications: Secondary | ICD-10-CM | POA: Diagnosis not present

## 2021-07-26 DIAGNOSIS — R110 Nausea: Secondary | ICD-10-CM | POA: Insufficient documentation

## 2021-07-26 DIAGNOSIS — E039 Hypothyroidism, unspecified: Secondary | ICD-10-CM | POA: Insufficient documentation

## 2021-07-26 DIAGNOSIS — Z8673 Personal history of transient ischemic attack (TIA), and cerebral infarction without residual deficits: Secondary | ICD-10-CM | POA: Insufficient documentation

## 2021-07-26 DIAGNOSIS — N132 Hydronephrosis with renal and ureteral calculous obstruction: Principal | ICD-10-CM | POA: Insufficient documentation

## 2021-07-26 DIAGNOSIS — J449 Chronic obstructive pulmonary disease, unspecified: Secondary | ICD-10-CM | POA: Diagnosis not present

## 2021-07-26 DIAGNOSIS — Z79899 Other long term (current) drug therapy: Secondary | ICD-10-CM | POA: Diagnosis not present

## 2021-07-26 HISTORY — PX: IR URETERAL STENT RIGHT NEW ACCESS W/O SEP NEPHROSTOMY CATH: IMG6076

## 2021-07-26 HISTORY — PX: NEPHROLITHOTOMY: SHX5134

## 2021-07-26 LAB — GLUCOSE, CAPILLARY
Glucose-Capillary: 194 mg/dL — ABNORMAL HIGH (ref 70–99)
Glucose-Capillary: 161 mg/dL — ABNORMAL HIGH (ref 70–99)

## 2021-07-26 LAB — TYPE AND SCREEN
ABO/RH(D): B POS
Antibody Screen: NEGATIVE

## 2021-07-26 LAB — BASIC METABOLIC PANEL
Anion gap: 6 (ref 5–15)
BUN: 23 mg/dL (ref 8–23)
CO2: 22 mmol/L (ref 22–32)
Calcium: 8.9 mg/dL (ref 8.9–10.3)
Chloride: 107 mmol/L (ref 98–111)
Creatinine, Ser: 0.79 mg/dL (ref 0.44–1.00)
GFR, Estimated: >60 mL/min (ref >60)
Glucose, Bld: 211 mg/dL — ABNORMAL HIGH (ref 70–99)
Potassium: 4.1 mmol/L (ref 3.5–5.1)
Sodium: 135 mmol/L (ref 135–145)

## 2021-07-26 LAB — CBC
HCT: 48.5 % — ABNORMAL HIGH (ref 36.0–46.0)
Hemoglobin: 15.6 g/dL — ABNORMAL HIGH (ref 12.0–15.0)
MCH: 28.4 pg (ref 26.0–34.0)
MCHC: 32.2 g/dL (ref 30.0–36.0)
MCV: 88.2 fL (ref 80.0–100.0)
Platelets: 196 10*3/uL (ref 150–400)
RBC: 5.5 MIL/uL — ABNORMAL HIGH (ref 3.87–5.11)
RDW: 14.3 % (ref 11.5–15.5)
WBC: 5.9 10*3/uL (ref 4.0–10.5)
nRBC: 0 % (ref 0.0–0.2)

## 2021-07-26 LAB — PROTIME-INR
INR: 1.1 (ref 0.8–1.2)
Prothrombin Time: 13.9 seconds (ref 11.4–15.2)

## 2021-07-26 SURGERY — NEPHROLITHOTOMY PERCUTANEOUS
Anesthesia: General | Laterality: Right

## 2021-07-26 MED ORDER — APREPITANT 40 MG PO CAPS
40.0000 mg | ORAL_CAPSULE | Freq: Once | ORAL | Status: AC
  Administered 2021-07-26: 40 mg via ORAL
  Filled 2021-07-26: qty 1

## 2021-07-26 MED ORDER — AMLODIPINE BESYLATE 5 MG PO TABS: 5.0000 mg | ORAL_TABLET | Freq: Every day | ORAL | Status: DC

## 2021-07-26 MED ORDER — ROCURONIUM BROMIDE 100 MG/10ML IV SOLN
INTRAVENOUS | Status: DC | PRN
  Administered 2021-07-26: 50 mg via INTRAVENOUS
  Administered 2021-07-26: 20 mg via INTRAVENOUS

## 2021-07-26 MED ORDER — ONDANSETRON HCL 4 MG/2ML IJ SOLN
INTRAMUSCULAR | Status: DC | PRN
  Administered 2021-07-26 (×2): 4 mg via INTRAVENOUS

## 2021-07-26 MED ORDER — SODIUM CHLORIDE 0.9 % IR SOLN
Status: DC | PRN
  Administered 2021-07-26 (×2): 6000 mL

## 2021-07-26 MED ORDER — MIDAZOLAM HCL 2 MG/2ML IJ SOLN
INTRAMUSCULAR | Status: AC | PRN
  Administered 2021-07-26: 1 mg via INTRAVENOUS

## 2021-07-26 MED ORDER — FENTANYL CITRATE PF 50 MCG/ML IJ SOSY
PREFILLED_SYRINGE | INTRAMUSCULAR | Status: AC
  Filled 2021-07-26: qty 1

## 2021-07-26 MED ORDER — HYDROMORPHONE HCL 1 MG/ML IJ SOLN
0.5000 mg | INTRAMUSCULAR | Status: DC | PRN
Start: 2021-07-26 — End: 2021-07-27
  Administered 2021-07-26: 0.5 mg via INTRAVENOUS
  Administered 2021-07-27 (×2): 1 mg via INTRAVENOUS
  Filled 2021-07-26 (×4): qty 1

## 2021-07-26 MED ORDER — SODIUM CHLORIDE 0.9 % IV SOLN: 2.0000 g | INTRAVENOUS | Status: DC

## 2021-07-26 MED ORDER — LACTATED RINGERS IV SOLN
INTRAVENOUS | Status: DC
Start: 2021-07-26 — End: 2021-07-26

## 2021-07-26 MED ORDER — PROPOFOL 10 MG/ML IV BOLUS
INTRAVENOUS | Status: AC
  Filled 2021-07-26: qty 20

## 2021-07-26 MED ORDER — ONDANSETRON HCL 4 MG/2ML IJ SOLN
4.0000 mg | INTRAMUSCULAR | Status: DC | PRN
  Administered 2021-07-27 (×2): 4 mg via INTRAVENOUS
  Filled 2021-07-26 (×2): qty 2

## 2021-07-26 MED ORDER — CHLORHEXIDINE GLUCONATE 0.12 % MT SOLN
15.0000 mL | Freq: Once | OROMUCOSAL | Status: AC
  Administered 2021-07-26: 15 mL via OROMUCOSAL

## 2021-07-26 MED ORDER — DIPHENHYDRAMINE HCL 50 MG/ML IJ SOLN
INTRAMUSCULAR | Status: AC
  Filled 2021-07-26: qty 1

## 2021-07-26 MED ORDER — FENTANYL CITRATE PF 50 MCG/ML IJ SOSY: 25.0000 ug | PREFILLED_SYRINGE | INTRAMUSCULAR | Status: DC | PRN

## 2021-07-26 MED ORDER — PROPOFOL 500 MG/50ML IV EMUL
INTRAVENOUS | Status: AC
  Filled 2021-07-26: qty 50

## 2021-07-26 MED ORDER — CEFAZOLIN SODIUM-DEXTROSE 2-4 GM/100ML-% IV SOLN
2.0000 g | INTRAVENOUS | Status: DC
  Filled 2021-07-26: qty 100

## 2021-07-26 MED ORDER — FENTANYL CITRATE PF 50 MCG/ML IJ SOSY
PREFILLED_SYRINGE | INTRAMUSCULAR | Status: AC
  Administered 2021-07-26: 50 ug via INTRAVENOUS
  Filled 2021-07-26: qty 1

## 2021-07-26 MED ORDER — PROPOFOL 10 MG/ML IV BOLUS
INTRAVENOUS | Status: DC | PRN
  Administered 2021-07-26: 150 mg via INTRAVENOUS

## 2021-07-26 MED ORDER — FENTANYL CITRATE (PF) 100 MCG/2ML IJ SOLN
INTRAMUSCULAR | Status: AC
  Filled 2021-07-26: qty 2

## 2021-07-26 MED ORDER — IOHEXOL 300 MG/ML  SOLN
INTRAMUSCULAR | Status: DC | PRN
  Administered 2021-07-26: 10 mL via URETHRAL

## 2021-07-26 MED ORDER — ONDANSETRON HCL 4 MG/2ML IJ SOLN
INTRAMUSCULAR | Status: AC
  Filled 2021-07-26: qty 2

## 2021-07-26 MED ORDER — DEXAMETHASONE SODIUM PHOSPHATE 10 MG/ML IJ SOLN
INTRAMUSCULAR | Status: AC
  Filled 2021-07-26: qty 1

## 2021-07-26 MED ORDER — AMISULPRIDE (ANTIEMETIC) 5 MG/2ML IV SOLN: 10.0000 mg | Freq: Once | INTRAVENOUS | Status: DC | PRN

## 2021-07-26 MED ORDER — CEPHALEXIN 500 MG PO CAPS
500.0000 mg | ORAL_CAPSULE | Freq: Two times a day (BID) | ORAL | Status: DC
  Administered 2021-07-26 – 2021-07-27 (×2): 500 mg via ORAL
  Filled 2021-07-26 (×2): qty 1

## 2021-07-26 MED ORDER — ACETAMINOPHEN 325 MG PO TABS: 650.0000 mg | ORAL_TABLET | ORAL | Status: DC | PRN

## 2021-07-26 MED ORDER — METOPROLOL TARTRATE 25 MG PO TABS
25.0000 mg | ORAL_TABLET | Freq: Two times a day (BID) | ORAL | Status: DC
  Administered 2021-07-26 – 2021-07-27 (×2): 25 mg via ORAL
  Filled 2021-07-26 (×2): qty 1

## 2021-07-26 MED ORDER — ORAL CARE MOUTH RINSE: 15.0000 mL | Freq: Once | OROMUCOSAL | Status: AC

## 2021-07-26 MED ORDER — LEVOTHYROXINE SODIUM 100 MCG PO TABS
100.0000 ug | ORAL_TABLET | Freq: Every day | ORAL | Status: DC
  Administered 2021-07-27: 100 ug via ORAL
  Filled 2021-07-26: qty 1

## 2021-07-26 MED ORDER — DIPHENHYDRAMINE HCL 50 MG/ML IJ SOLN
INTRAMUSCULAR | Status: DC | PRN
  Administered 2021-07-26: 6.25 mg via INTRAVENOUS

## 2021-07-26 MED ORDER — DEXAMETHASONE SODIUM PHOSPHATE 10 MG/ML IJ SOLN
INTRAMUSCULAR | Status: DC | PRN
  Administered 2021-07-26: 5 mg via INTRAVENOUS

## 2021-07-26 MED ORDER — SODIUM CHLORIDE 0.9 % IV SOLN: 2.0000 g | INTRAVENOUS | Status: AC

## 2021-07-26 MED ORDER — LIDOCAINE HCL (PF) 2 % IJ SOLN
INTRAMUSCULAR | Status: AC
  Filled 2021-07-26: qty 5

## 2021-07-26 MED ORDER — FENTANYL CITRATE (PF) 100 MCG/2ML IJ SOLN
INTRAMUSCULAR | Status: AC | PRN
  Administered 2021-07-26: 50 ug via INTRAVENOUS

## 2021-07-26 MED ORDER — PROPOFOL 500 MG/50ML IV EMUL
INTRAVENOUS | Status: DC | PRN
  Administered 2021-07-26: 35 ug/kg/min via INTRAVENOUS

## 2021-07-26 MED ORDER — ROCURONIUM BROMIDE 10 MG/ML (PF) SYRINGE
PREFILLED_SYRINGE | INTRAVENOUS | Status: AC
  Filled 2021-07-26: qty 10

## 2021-07-26 MED ORDER — ACETAMINOPHEN 10 MG/ML IV SOLN
INTRAVENOUS | Status: AC
  Filled 2021-07-26: qty 100

## 2021-07-26 MED ORDER — SUGAMMADEX SODIUM 200 MG/2ML IV SOLN
INTRAVENOUS | Status: DC | PRN
  Administered 2021-07-26: 200 mg via INTRAVENOUS

## 2021-07-26 MED ORDER — MIDAZOLAM HCL 2 MG/2ML IJ SOLN
INTRAMUSCULAR | Status: AC
  Filled 2021-07-26: qty 2

## 2021-07-26 MED ORDER — LIDOCAINE 2% (20 MG/ML) 5 ML SYRINGE
INTRAMUSCULAR | Status: DC | PRN
  Administered 2021-07-26: 60 mg via INTRAVENOUS

## 2021-07-26 MED ORDER — SODIUM CHLORIDE 0.9 % IV SOLN: INTRAVENOUS | Status: DC

## 2021-07-26 MED ORDER — FENTANYL CITRATE (PF) 250 MCG/5ML IJ SOLN
INTRAMUSCULAR | Status: AC
  Filled 2021-07-26: qty 5

## 2021-07-26 MED ORDER — LIDOCAINE HCL (PF) 1 % IJ SOLN
INTRAMUSCULAR | Status: AC | PRN
  Administered 2021-07-26: 10 mL via INTRADERMAL

## 2021-07-26 MED ORDER — SODIUM CHLORIDE 0.45 % IV SOLN: INTRAVENOUS | Status: DC

## 2021-07-26 MED ORDER — SODIUM CHLORIDE 0.9 % IV SOLN
INTRAVENOUS | Status: AC
  Administered 2021-07-26: 2 g via INTRAVENOUS
  Filled 2021-07-26: qty 20

## 2021-07-26 MED ORDER — MIDAZOLAM HCL 2 MG/2ML IJ SOLN
INTRAMUSCULAR | Status: AC
  Filled 2021-07-26: qty 4

## 2021-07-26 MED ORDER — SENNA 8.6 MG PO TABS: 1.0000 | ORAL_TABLET | Freq: Two times a day (BID) | ORAL | Status: DC

## 2021-07-26 MED ORDER — MIDAZOLAM HCL 5 MG/5ML IJ SOLN
INTRAMUSCULAR | Status: DC | PRN
  Administered 2021-07-26: 1 mg via INTRAVENOUS

## 2021-07-26 MED ORDER — ACETAMINOPHEN 10 MG/ML IV SOLN
1000.0000 mg | Freq: Four times a day (QID) | INTRAVENOUS | Status: DC
  Administered 2021-07-27 (×2): 1000 mg via INTRAVENOUS
  Filled 2021-07-26 (×2): qty 100

## 2021-07-26 MED ORDER — FENTANYL CITRATE (PF) 100 MCG/2ML IJ SOLN
INTRAMUSCULAR | Status: DC | PRN
  Administered 2021-07-26: 50 ug via INTRAVENOUS

## 2021-07-26 MED ORDER — LIDOCAINE HCL 1 % IJ SOLN
INTRAMUSCULAR | Status: AC
  Filled 2021-07-26: qty 20

## 2021-07-26 MED ORDER — ACETAMINOPHEN 10 MG/ML IV SOLN
1000.0000 mg | Freq: Once | INTRAVENOUS | Status: DC | PRN
  Administered 2021-07-26: 1000 mg via INTRAVENOUS

## 2021-07-26 SURGICAL SUPPLY — 50 items
BAG COUNTER SPONGE SURGICOUNT (BAG) IMPLANT
BAG URINE DRAIN 2000ML AR STRL (UROLOGICAL SUPPLIES) IMPLANT
BASKET ZERO TIP NITINOL 2.4FR (BASKET) IMPLANT
BENZOIN TINCTURE PRP APPL 2/3 (GAUZE/BANDAGES/DRESSINGS) ×4 IMPLANT
BLADE SURG 15 STRL LF DISP TIS (BLADE) ×1 IMPLANT
BLADE SURG 15 STRL SS (BLADE) ×2
CATH FOLEY 2W COUNCIL 20FR 5CC (CATHETERS) IMPLANT
CATH FOLEY 2W COUNCIL 5CC 16FR (CATHETERS) ×2 IMPLANT
CATH ROBINSON RED A/P 20FR (CATHETERS) IMPLANT
CATH X-FORCE N30 NEPHROSTOMY (TUBING) ×2 IMPLANT
CHLORAPREP W/TINT 26 (MISCELLANEOUS) ×2 IMPLANT
COVER SURGICAL LIGHT HANDLE (MISCELLANEOUS) ×2 IMPLANT
DRAPE C-ARM 42X120 X-RAY (DRAPES) ×2 IMPLANT
DRAPE LINGEMAN PERC (DRAPES) ×2 IMPLANT
DRAPE SURG IRRIG POUCH 19X23 (DRAPES) ×2 IMPLANT
DRSG PAD ABDOMINAL 8X10 ST (GAUZE/BANDAGES/DRESSINGS) IMPLANT
DRSG TEGADERM 8X12 (GAUZE/BANDAGES/DRESSINGS) ×4 IMPLANT
EXTRACTOR STONE 1.7FRX115CM (UROLOGICAL SUPPLIES) ×2 IMPLANT
GAUZE SPONGE 4X4 12PLY STRL (GAUZE/BANDAGES/DRESSINGS) IMPLANT
GLOVE SURG ENC TEXT LTX SZ8 (GLOVE) ×2 IMPLANT
GOWN STRL REUS W/TWL XL LVL3 (GOWN DISPOSABLE) ×4 IMPLANT
GUIDEWIRE AMPLAZ .035X145 (WIRE) ×4 IMPLANT
GUIDEWIRE ZIPWRE .038 STRAIGHT (WIRE) ×2 IMPLANT
KIT BASIN OR (CUSTOM PROCEDURE TRAY) ×2 IMPLANT
KIT PROBE 340X3.4XDISP GRN (MISCELLANEOUS) IMPLANT
KIT PROBE TRILOGY 3.4X340 (MISCELLANEOUS)
KIT PROBE TRILOGY 3.9X350 (MISCELLANEOUS) ×2 IMPLANT
KIT TURNOVER KIT A (KITS) IMPLANT
LASER FIB FLEXIVA PULSE ID 365 (Laser) IMPLANT
LASER FIB FLEXIVA PULSE ID 550 (Laser) IMPLANT
LASER FIB FLEXIVA PULSE ID 910 (Laser) IMPLANT
MANIFOLD NEPTUNE II (INSTRUMENTS) ×2 IMPLANT
NS IRRIG 1000ML POUR BTL (IV SOLUTION) ×2 IMPLANT
PACK CYSTO (CUSTOM PROCEDURE TRAY) ×2 IMPLANT
PENCIL SMOKE EVACUATOR (MISCELLANEOUS) IMPLANT
SHEATH PEELAWAY SET 9 (SHEATH) ×2 IMPLANT
SPONGE T-LAP 4X18 ~~LOC~~+RFID (SPONGE) ×2 IMPLANT
STENT URET 6FRX26 CONTOUR (STENTS) ×2 IMPLANT
SUT SILK 2 0 30  PSL (SUTURE) ×2
SUT SILK 2 0 30 PSL (SUTURE) ×1 IMPLANT
SYR 10ML LL (SYRINGE) ×2 IMPLANT
SYR 20ML LL LF (SYRINGE) ×4 IMPLANT
TRACTIP FLEXIVA PULS ID 200XHI (Laser) IMPLANT
TRACTIP FLEXIVA PULSE ID 200 (Laser)
TRAY FOLEY MTR SLVR 14FR STAT (SET/KITS/TRAYS/PACK) IMPLANT
TRAY FOLEY MTR SLVR 16FR STAT (SET/KITS/TRAYS/PACK) ×2 IMPLANT
TUBING CONNECTING 10 (TUBING) ×4 IMPLANT
TUBING STONE CATCHER TRILOGY (MISCELLANEOUS) ×2 IMPLANT
TUBING UROLOGY SET (TUBING) ×2 IMPLANT
WATER STERILE IRR 1000ML POUR (IV SOLUTION) ×2 IMPLANT

## 2021-07-26 NOTE — Transfer of Care (Signed)
Immediate Anesthesia Transfer of Care Note  Patient: [name]  Procedure(s) Performed: NEPHROLITHOTOMY PERCUTANEOUS (Right)  Patient Location: PACU  Anesthesia Type:General  Level of Consciousness: sedated  Airway & Oxygen Therapy: Patient Spontanous Breathing and Patient connected to face mask oxygen  Post-op Assessment: Report given to RN and Post -op Vital signs reviewed and stable  Post vital signs: Reviewed and stable  Last Vitals:  Vitals Value Taken Time  BP 123/76 07/26/21 1518  Temp    Pulse 70 07/26/21 1521  Resp 19 07/26/21 1521  SpO2 100 % 07/26/21 1521  Vitals shown include unvalidated device data.  Last Pain:  Vitals:   07/26/21 1050  TempSrc:   PainSc: 0-No pain         Complications: No notable events documented.

## 2021-07-26 NOTE — Anesthesia Preprocedure Evaluation (Addendum)
Anesthesia Evaluation  Patient identified by MRN, date of birth, ID band Patient awake    Reviewed: Allergy & Precautions, NPO status , Patient's Chart, lab work & pertinent test results  History of Anesthesia Complications (+) PONV  Airway Mallampati: II  TM Distance: >3 FB Neck ROM: Full    Dental no notable dental hx.    Pulmonary asthma ,  COPD inhaler, former smoker,    Pulmonary exam normal        Cardiovascular hypertension, Pt. on medications and Pt. on home beta blockers  Rhythm:Regular Rate:Normal     Neuro/Psych  Headaches, TIA (no deficits)negative psych ROS   GI/Hepatic Neg liver ROS, GERD  Controlled,  Endo/Other  diabetes, Well ControlledHypothyroidism   Renal/GU Right renal stones  negative genitourinary   Musculoskeletal  (+) Arthritis , Osteoarthritis,    Abdominal Normal abdominal exam  (+)   Peds  Hematology negative hematology ROS (+)   Anesthesia Other Findings   Reproductive/Obstetrics                            Anesthesia Physical Anesthesia Plan  ASA: 3  Anesthesia Plan: General   Post-op Pain Management:    Induction: Intravenous  PONV Risk Score and Plan: 4 or greater and Ondansetron, Dexamethasone, Treatment may vary due to age or medical condition, Aprepitant and Amisulpride  Airway Management Planned: Mask and Oral ETT  Additional Equipment: None  Intra-op Plan:   Post-operative Plan: Extubation in OR  Informed Consent: I have reviewed the patients History and Physical, chart, labs and discussed the procedure including the risks, benefits and alternatives for the proposed anesthesia with the patient or authorized representative who has indicated his/her understanding and acceptance.     Dental advisory given  Plan Discussed with: CRNA  Anesthesia Plan Comments: (Lab Results      Component                Value               Date                       WBC                      5.9                 07/26/2021                HGB                      15.6 (H)            07/26/2021                HCT                      48.5 (H)            07/26/2021                MCV                      88.2                07/26/2021                PLT  196                 07/26/2021           Lab Results      Component                Value               Date                      NA                       135                 07/26/2021                K                        4.1                 07/26/2021                CO2                      22                  07/26/2021                GLUCOSE                  211 (H)             07/26/2021                BUN                      23                  07/26/2021                CREATININE               0.79                07/26/2021                CALCIUM                  8.9                 07/26/2021                EGFR                     73                  07/03/2021                GFRNONAA                 >60                 07/26/2021          )       Anesthesia Quick Evaluation

## 2021-07-26 NOTE — Progress Notes (Addendum)
Patient received from PACU.  Vital signs normal.  Nasal cannula removed, patient SpO2 96% on RA at rest.  Patient appears comfortable in bed eating dinner, sister at bedside.  Right nephrostomy and foley catheter assessed and secure.  Angie Fava, RN

## 2021-07-26 NOTE — Anesthesia Procedure Notes (Signed)
Procedure Name: Intubation Date/Time: 07/26/2021 1:26 PM Performed by: Gwyndolyn Saxon, CRNA Pre-anesthesia Checklist: Patient identified, Emergency Drugs available, Suction available and Patient being monitored Patient Re-evaluated:Patient Re-evaluated prior to induction Oxygen Delivery Method: Circle system utilized Preoxygenation: Pre-oxygenation with 100% oxygen Induction Type: IV induction Ventilation: Mask ventilation without difficulty Laryngoscope Size: Miller and 2 Grade View: Grade II Tube type: Oral Tube size: 7.0 mm Number of attempts: 1 Airway Equipment and Method: Patient positioned with wedge pillow and Stylet Placement Confirmation: ETT inserted through vocal cords under direct vision, positive ETCO2 and breath sounds checked- equal and bilateral Secured at: 21 cm Tube secured with: Tape Dental Injury: Teeth and Oropharynx as per pre-operative assessment

## 2021-07-26 NOTE — Anesthesia Postprocedure Evaluation (Signed)
Anesthesia Post Note  Patient: [name]  Procedure(s) Performed: NEPHROLITHOTOMY PERCUTANEOUS (Right)     Patient location during evaluation: PACU Anesthesia Type: General Level of consciousness: awake and alert Pain management: pain level controlled Vital Signs Assessment: post-procedure vital signs reviewed and stable Respiratory status: spontaneous breathing, nonlabored ventilation, respiratory function stable and patient connected to nasal cannula oxygen Cardiovascular status: blood pressure returned to baseline and stable Postop Assessment: no apparent nausea or vomiting Anesthetic complications: no   No notable events documented.  Last Vitals:  Vitals:   07/26/21 1630 07/26/21 1645  BP: 127/61 129/66  Pulse: 75 82  Resp: 11 15  Temp:  36.4 C  SpO2: 100% 100%    Last Pain:  Vitals:   07/26/21 1645  TempSrc:   PainSc: 0-No pain                 Belenda Cruise P Mayrani Khamis

## 2021-07-26 NOTE — Discharge Instructions (Signed)
DISCHARGE INSTRUCTIONS FOR PERCUTANEOUS STONE SURGERY MEDICATIONS:  1. DO NOT RESUME YOUR IBUPROFEN, or any other medicines like aspirin, motrin, excedrin, advil, aleve, vitamin E, fish oil as these can all cause bleeding x 10 days.  2. Resume all your other meds from home - except do not take any other pain meds that you may have at home.  ACTIVITY 1. No strenuous activity, sexual activity, or lifting greater than 10 pounds for 2 weeks. 2. No driving while on narcotic pain medications 3. Drink plenty of water 4. Continue to walk at home - you can still get blood clots when you are at home, so keep active, but don't over do it. 5. May return to work in 1 week (but not heavy or strenuous activity).   BATHING You can shower.  Cover your wound with a dressing and remove the dressing immediately after the shower.  Do not submerge wound under water.   WOUND CARE Your wound will drain bloody fluid and may do so for 7-14 days. You have 2 options for dressings:  1. You may use gauze and tape to dress your wound.  If you choose this method, then change the dressing as it becomes soaked.  Change it at least once daily until it stops draining. You may switch to a Band Aid once drainage stops. 2. If drainage is copious, you may use an ostomy device.  This is a bag with an andhesive circle.  The circle has a hole in the middle of it and you cut the hole to the size needed to fit the wound.  This will collect the drainage in the bag and allow you to drain the bag as needed.   SIGNS/SYMPTOMS TO CALL: Please call us if you have a fever greater than 101.5, uncontrolled nausea/vomiting, uncontrolled pain, dizziness, unable to urinate, bloody urine, chest pain, shortness of breath, leg swelling, leg pain, redness around wound, drainage from wound, or any other concerns or questions. You can reach us at [phone].   

## 2021-07-26 NOTE — Interval H&P Note (Signed)
History and Physical Interval Note:  07/26/2021 12:08 PM  [name]  has presented today for surgery, with the diagnosis of RIGHT RENAL STONES.  The various methods of treatment have been discussed with the patient and family. After consideration of risks, benefits and other options for treatment, the patient has consented to  Procedure(s): NEPHROLITHOTOMY PERCUTANEOUS (Right) HOLMIUM LASER APPLICATION (Right) as a surgical intervention.  The patient's history has been reviewed, patient examined, no change in status, stable for surgery.  I have reviewed the patient's chart and labs.  Questions were answered to the patient's satisfaction.     Lillette Boxer Kathya Wilz

## 2021-07-26 NOTE — Procedures (Signed)
Interventional Radiology Procedure Note  Procedure: RT PCN ACCESS FOR OR PCNL    Complications: None  Estimated Blood Loss:  MIN  Findings: 5FR ACCESS THRU POWER POLE INTO THE BLADDER     Tamera Punt, MD

## 2021-07-26 NOTE — Consult Note (Signed)
Chief Complaint: Patient was seen in consultation today for  right percutaneous nephrostomy/nephroureteral catheter placement  Referring Physician(s): [place]  Supervising Physician: Daryll Brod  Patient Status: [facility] - [facility] TBA  History of Present Illness: [name] is a 70 y.o. female with PMH sig for arthritis, asthma, diabetes, GERD, hyperlipidemia, hypertension, hypothyroidism, IBS, prior TIA who presents now with history of back pain and right renal calculi.  She has a dominant 20.5 mm right renal pelvic stone as well as upper and lower calyceal stones in the ipsilateral kidney.  She presents today for right percutaneous nephrostomy/nephroureteral catheter placement prior to nephrolithotomy.  Past Medical History:  Diagnosis Date   Arthritis    Asthma    takes Singulair nightly   Diabetes mellitus    takes levemir daily and Metformin daily   Generalized headaches    GERD (gastroesophageal reflux disease)    takes Omeprazole daily   Herniated disc, cervical    History of kidney stones    Hyperlipidemia    takes Welchol daily   Hypertension    takes Enalapril dialy   Hypothyroidism    takes Synthroid daily   IBS (irritable bowel syndrome)    Joint pain    hands and neck   Kidney stones    Nocturia    PONV (postoperative nausea and vomiting)    TIA (transient ischemic attack)    diagnosed by EEG but never knew she had it   Trigger finger    Weakness    Weakness    in both hands    Past Surgical History:  Procedure Laterality Date   ABDOMINAL HYSTERECTOMY  1980   ANTERIOR CERVICAL DECOMP/DISCECTOMY FUSION N/A 10/18/2013   Procedure: Cervical three-four, Cervical four-five, Cervical seven-Thoracic one anterior cervical decompression with fusion plating and bonegraft;  Surgeon: Hosie Spangle, MD;  Location: [facility];  Service: Neurosurgery;  Laterality: N/A;  Cervical three-four, Cervical four-five, Cervical seven-Thoracic one anterior  cervical decompression with fusion plating and bonegraft   APPENDECTOMY     done with hysterectomy   BACK SURGERY  2007   lumbar fusion   carpel tunnel Bilateral    left first then right   cataract implants     COLONOSCOPY     ESOPHAGOGASTRODUODENOSCOPY     hx of h pylori   EYE SURGERY  11/18/2008   right - cataracts   EYE SURGERY  06/01/2009   left - cataracts   herniated disc   10/2000, 08/31/2008   C5-C6 then C6-C7   knee anthroscopy bilateral Bilateral    KNEE ARTHROSCOPY     LITHOTRIPSY  11/12/2007   right side   Parathyroid surgery     October 2013   PARATHYROIDECTOMY  07/02/2012   Procedure: PARATHYROIDECTOMY;  Surgeon: Earnstine Regal, MD;  Location: [facility];  Service: General;  Laterality: N/A;   TONSILLECTOMY     as a child    Allergies: Advair diskus [fluticasone-salmeterol], Codeine, Hydrocodone-acetaminophen, Oxycodone-acetaminophen, and Tetracyclines & related  Medications: Prior to Admission medications   Medication Sig Start Date End Date Taking? Authorizing Provider  amLODipine (NORVASC) 5 MG tablet Take 1 tablet (5 mg total) by mouth daily. 07/03/21   Lindell Spar, MD  blood glucose meter kit and supplies KIT Dispense based on patient and insurance preference. Use up to four times daily as directed. 07/17/21   Brita Romp, NP  levothyroxine (SYNTHROID) 100 MCG tablet Take 1 tablet (100 mcg total) by mouth daily before breakfast. 07/10/21  Brita Romp, NP  metoprolol tartrate (LOPRESSOR) 25 MG tablet Take 1 tablet (25 mg total) by mouth 2 (two) times daily. 07/03/21   Lindell Spar, MD  Vitamin D, Ergocalciferol, (DRISDOL) 1.25 MG (50000 UNIT) CAPS capsule Take 1 capsule (50,000 Units total) by mouth every 7 (seven) days. 07/10/21   Brita Romp, NP     Family History  Problem Relation Age of Onset   Cancer Mother        pancreatic    Social History   Socioeconomic History   Marital status: Widowed    Spouse name: Not on file    Number of children: Not on file   Years of education: Not on file   Highest education level: Not on file  Occupational History   Not on file  Tobacco Use   Smoking status: Former    Packs/day: 1.00    Years: 40.00    Pack years: 40.00    Types: Cigarettes    Quit date: 07/19/2012    Years since quitting: 9.0   Smokeless tobacco: Never   Tobacco comments:    quit smoking in Aug 16, 2012  Vaping Use   Vaping Use: Never used  Substance and Sexual Activity   Alcohol use: No    Alcohol/week: 0.0 standard drinks   Drug use: No   Sexual activity: Not on file  Other Topics Concern   Not on file  Social History Narrative   Widow,husband died 08/17/19 after 30 years marriage.Retired.   Social Determinants of Health   Financial Resource Strain: Not on file  Food Insecurity: Not on file  Transportation Needs: Not on file  Physical Activity: Not on file  Stress: Not on file  Social Connections: Not on file      Review of Systems denies fever, headache, chest pain, dyspnea, cough, nausea, vomiting or bleeding.  She has had some intermittent abdominal and back discomfort  Vital Signs: Blood  pressure 147/80, temp 97.9, heart rate 83, respirations 16, O2 sat 100% room air    Physical Exam awake, alert.  Chest clear to auscultation bilaterally.  Heart with regular rate and rhythm.  Abdomen soft, positive bowel sounds, currently nontender.  No lower extremity edema; currently without back pain  Imaging: US THYROID  Result Date: 06/26/2021 CLINICAL DATA:  History of parathyroid adenoma. EXAM: THYROID ULTRASOUND TECHNIQUE: Ultrasound examination of the thyroid gland and adjacent soft tissues was performed. COMPARISON:  04/23/2011 FINDINGS: Parenchymal Echotexture: Mildly heterogenous Isthmus: 0.1 cm, previously 0.2 cm Right lobe: 4.6 x 1.9 x 1.4 cm, previously 4.1 x 1.4 x 1.4 cm Left lobe: 3.1 x 1.0 x 1.1 cm, previously 3.9 x 1.5 x 1.3 cm  _________________________________________________________ Estimated total number of nodules >/= 1 cm: 0 Number of spongiform nodules >/=  2 cm not described below (TR1): 0 Number of mixed cystic and solid nodules >/= 1.5 cm not described below ([place]): 0 _________________________________________________________ No discrete nodules. Previously there was a nodule in the left thyroid lobe that is no longer present. IMPRESSION: Thyroid tissue is mildly heterogeneous without discrete thyroid nodules. Electronically Signed   By: Markus Daft M.D.   On: 06/26/2021 11:17    Labs:  CBC: Recent Labs    05/30/21 2126 07/09/21 1128 07/26/21 0835  WBC 7.1 6.4 5.9  HGB 15.4* 14.5 15.6*  HCT 46.2* 44.7 48.5*  PLT 238 225 196    COAGS: Recent Labs    07/26/21 0835  INR 1.1    BMP: Recent  Labs    08/21/20 1042 12/25/20 1034 05/30/21 2126 07/03/21 0918 07/26/21 0835  NA 139 140 136 141 135  K 4.8 4.9 4.0 4.8 4.1  CL 106 105 105 103 107  CO2 _0 18* 22  GLUCOSE 114* 133* 142* 161* 211*  BUN _1 CALCIUM 8.6 8.7 8.3* 8.4* 8.9  CREATININE 0.93 0.81 0.82 0.85 0.79  GFRNONAA 62 73 >60  --  >60  GFRAA 72 85  --   --   --     LIVER FUNCTION TESTS: Recent Labs    08/21/20 1042 12/25/20 1034 07/03/21 0918  BILITOT 0.4 0.5 0.4  AST _2 ALT _3 ALKPHOS  --   --  70  PROT 6.3 5.9* 6.2  ALBUMIN  --   --  4.4    TUMOR MARKERS: No results for input(s): AFPTM, CEA, CA199, CHROMGRNA in the last 8760 hours.  Assessment and Plan: 70 y.o. female with PMH sig for arthritis, asthma, diabetes, GERD, hyperlipidemia, hypertension, hypothyroidism, IBS, prior TIA who presents now with history of back pain and right renal calculi.  She has a dominant 20.5 mm right renal pelvic stone as well as upper and lower calyceal stones in the ipsilateral kidney.  She presents today for right percutaneous nephrostomy/nephroureteral catheter placement prior to nephrolithotomy.Risks and  benefits of right PCN placement was discussed with the patient including, but not limited to, infection, bleeding, significant bleeding causing loss or decrease in renal function or damage to adjacent structures.   All of the patient's questions were answered, patient is agreeable to proceed.  Consent signed and in chart.      Thank you for this interesting consult.  I greatly enjoyed meeting [place] S Tullos and look forward to participating in their care.  A copy of this report was sent to the requesting provider on this date.  Electronically Signed: D. Rowe Robert, PA-C 07/26/2021, 9:09 AM   I spent a total of  25 minutes   in face to face in clinical consultation, greater than 50% of which was counseling/coordinating care for right percutaneous nephrostomy/nephroureteral catheter placement

## 2021-07-26 NOTE — Op Note (Signed)
Preoperative diagnosis: 21 mm right renal calculus with smaller calyceal calculi  Postoperative diagnosis: Same  Procedure: Right percutaneous nephrolithotomy, stone greater than 20 mm in size, right double-J stent placement, right antegrade nephrostogram, fluoroscopic interpretation  Surgeon: Dahlstedt  Anesthesia: General endotracheal  Complications: None  Estimated blood loss: Approximately 150 mL  Specimen: Stone fragments  Drains: 16 [place] council tip catheter as right nephrostomy tube, 6 [place] by 26 cm contour double-J stent in right ureter  Indications: 70 year old female with large symptomatic right renal pelvic stone with smaller upper and lower calyceal calculi.  She presents at this time for percutaneous management of the stone.  I discussed the procedure with the patient, expected outcomes, risks and complications including but not limited to blood loss, need for transfusion, need for second procedure, infection, anesthetic complications, need for stent, among others.  She understands these and desires to proceed.  Description of procedure: The patient had a percutaneous nephrostomy tube placed by interventional radiology.  A Kumpe catheter was used.  She was then met in the holding area, I discussed the procedure with her, her right side was marked, she was properly identified.  She was taken to the operating room.  General endotracheal anesthetic was administered.  44 French Foley catheter placed in her bladder.  She was then placed in the prone position on the operating table.  All pressure points and extremities were padded appropriately.  Her right flank was prepped and draped around the indwelling Kumpe catheter.  Timeout was then performed.  I then passed a Super Stiff guidewire through the existing Kumpe catheter, guiding this into the bladder using fluoroscopy.  Kumpe catheter was then removed.  Peel-away sheath was placed over top of the existing guidewire.  An incision  was made right beside the guidewire, dissection carried in the subcutaneous tissue with hemostat.  The peel-away catheter was advanced under fluoroscopic guidance into the proximal ureter.  The obturator was removed and a second Super Stiff guidewire passed into the bladder.  The peel-away sheath was removed.  Then passed a NephroMax balloon over top of the working guidewire, using fluoroscopic guidance to advance this beside the stone.  Balloon inflated to 18 atm of pressure.  The nephrostomy access sheath was then passed over the balloon up to the stone using fluoroscopy.  Balloon deflated, removed.  The rigid nephroscope was then passed, the stone easily identified in the renal pelvis.  Small clots were picked out of the renal pelvis.  Using the trilogy lithotrite, the stone was fragmented into several smaller fragments which were then easily grasped and removed.  Following removal of all identifiable fragments in the renal pelvis, the rigid nephroscope was replaced with a flexible nephroscope.  1 fairly large lower pole calyceal stone was identified, grasped with the engage basket and extracted.  I passed the scope into all available lower pole calyces.  No further stones were seen.  Upper pole calyces were inspected.  1 small stone was seen, grasped and removed.  The flexible scope was then brought back into the renal pelvis which was inspected.  No further stones were seen here.  Once again, all calyces were inspected again, no stone matter seen.  One last look was performed in the renal pelvis using the rigid nephroscope.  Once this was found to be clear, I then backloaded the guidewire through the [facility] scope and passed a 26 cm x 6 French contour double-J stent.  Once it was passed to the bladder, identifiable by fluoroscopy,  the guidewire was removed.  Extra excellent proximal and distal curls were then seen directly under with fluoroscopy.  Sensor tip guidewire was then advanced through the UPJ down to the  bladder using fluoroscopy.  I then negotiated a 80 [place] council tip catheter over top of this, positioned it in the approximate area of the renal pelvis using fluoroscopy.  The balloon was partially inflated with 3 cc of contrast material.  The access sheath was then removed over top of the council tip catheter.  I then used approximately 12 cc of 50-50 mixture of Omnipaque/saline to perform an antegrade nephrostogram.  No calyceal or pelvic perforations were noted.  There was adequate egress of contrast into the proximal ureter.  At this point, the catheter was sutured to the skin using 0 silk suture.  Vertical mattress suture was also placed on the wound.  Hemostasis was adequate at this point.  Dry sterile dressing was placed.  The patient was then transferred to the supine position.  She was extubated, awakened, and taken to the PACU in stable condition having tolerated the procedure well.  Sponge, needle and instrument counts were correct x2.

## 2021-07-26 NOTE — Anesthesia Procedure Notes (Signed)
Date/Time: 07/26/2021 3:07 PM Performed by: Cynda Familia, CRNA Oxygen Delivery Method: Simple face mask Placement Confirmation: positive ETCO2 and breath sounds checked- equal and bilateral Dental Injury: Teeth and Oropharynx as per pre-operative assessment

## 2021-07-27 ENCOUNTER — Encounter (HOSPITAL_COMMUNITY): Payer: Self-pay | Admitting: Urology

## 2021-07-27 DIAGNOSIS — E119 Type 2 diabetes mellitus without complications: Secondary | ICD-10-CM | POA: Diagnosis not present

## 2021-07-27 DIAGNOSIS — E039 Hypothyroidism, unspecified: Secondary | ICD-10-CM | POA: Diagnosis not present

## 2021-07-27 DIAGNOSIS — I10 Essential (primary) hypertension: Secondary | ICD-10-CM | POA: Diagnosis not present

## 2021-07-27 DIAGNOSIS — N132 Hydronephrosis with renal and ureteral calculous obstruction: Secondary | ICD-10-CM | POA: Diagnosis not present

## 2021-07-27 DIAGNOSIS — Z87891 Personal history of nicotine dependence: Secondary | ICD-10-CM | POA: Diagnosis not present

## 2021-07-27 DIAGNOSIS — Z79899 Other long term (current) drug therapy: Secondary | ICD-10-CM | POA: Diagnosis not present

## 2021-07-27 LAB — HEMOGLOBIN AND HEMATOCRIT, BLOOD
HCT: 40.2 % (ref 36.0–46.0)
Hemoglobin: 13.1 g/dL (ref 12.0–15.0)

## 2021-07-27 MED ORDER — TRAMADOL HCL 50 MG PO TABS: 50.0000 mg | ORAL_TABLET | Freq: Four times a day (QID) | ORAL | 0 refills | Status: DC | PRN

## 2021-07-27 NOTE — Plan of Care (Signed)
  Problem: Education: Goal: Knowledge of General Education information will improve Description: Including pain rating scale, medication(s)/side effects and non-pharmacologic comfort measures 07/27/2021 1244 by Lennie Hummer, RN Outcome: Adequate for Discharge 07/27/2021 1144 by Lennie Hummer, RN Outcome: Progressing   Problem: Health Behavior/Discharge Planning: Goal: Ability to manage health-related needs will improve Outcome: Adequate for Discharge   Problem: Clinical Measurements: Goal: Ability to maintain clinical measurements within normal limits will improve Outcome: Adequate for Discharge Goal: Will remain free from infection 07/27/2021 1244 by Lennie Hummer, RN Outcome: Adequate for Discharge 07/27/2021 1144 by Lennie Hummer, RN Outcome: Progressing Goal: Diagnostic test results will improve Outcome: Adequate for Discharge Goal: Respiratory complications will improve 07/27/2021 1244 by Lennie Hummer, RN Outcome: Adequate for Discharge 07/27/2021 1144 by Lennie Hummer, RN Outcome: Progressing Goal: Cardiovascular complication will be avoided 07/27/2021 1244 by Lennie Hummer, RN Outcome: Adequate for Discharge 07/27/2021 1144 by Lennie Hummer, RN Outcome: Progressing   Problem: Activity: Goal: Risk for activity intolerance will decrease Outcome: Adequate for Discharge   Problem: Nutrition: Goal: Adequate nutrition will be maintained Outcome: Adequate for Discharge   Problem: Coping: Goal: Level of anxiety will decrease 07/27/2021 1244 by Lennie Hummer, RN Outcome: Adequate for Discharge 07/27/2021 1144 by Lennie Hummer, RN Outcome: Progressing   Problem: Elimination: Goal: Will not experience complications related to bowel motility Outcome: Adequate for Discharge Goal: Will not experience complications related to urinary retention Outcome: Adequate for Discharge   Problem: Pain Managment: Goal: General experience of comfort will  improve 07/27/2021 1244 by Lennie Hummer, RN Outcome: Adequate for Discharge 07/27/2021 1144 by Lennie Hummer, RN Outcome: Progressing   Problem: Safety: Goal: Ability to remain free from injury will improve Outcome: Adequate for Discharge   Problem: Skin Integrity: Goal: Risk for impaired skin integrity will decrease Outcome: Adequate for Discharge

## 2021-07-27 NOTE — Plan of Care (Signed)
  Problem: Education: Goal: Knowledge of General Education information will improve Description: Including pain rating scale, medication(s)/side effects and non-pharmacologic comfort measures Outcome: Progressing   Problem: Clinical Measurements: Goal: Will remain free from infection Outcome: Progressing Goal: Respiratory complications will improve Outcome: Progressing Goal: Cardiovascular complication will be avoided Outcome: Progressing   Problem: Coping: Goal: Level of anxiety will decrease Outcome: Progressing   Problem: Pain Managment: Goal: General experience of comfort will improve Outcome: Progressing

## 2021-07-30 ENCOUNTER — Other Ambulatory Visit: Payer: Self-pay | Admitting: Nurse Practitioner

## 2021-07-30 DIAGNOSIS — E119 Type 2 diabetes mellitus without complications: Secondary | ICD-10-CM

## 2021-07-30 MED ORDER — BLOOD GLUCOSE MONITOR KIT: PACK | 0 refills | Status: AC

## 2021-07-30 NOTE — Discharge Summary (Signed)
Patient ID: [name] MRN: [id number] DOB/AGE: Sep 06, 1951 70 y.o.  Admit date: 07/26/2021 Discharge date: 07/30/2021  Primary Care Physician:  Lindell Spar, MD  Discharge Diagnoses: Right renal calculi Present on Admission: **None**    Discharge Medications: Allergies as of 07/27/2021       Reactions   Advair Diskus [fluticasone-salmeterol] Anaphylaxis   Codeine Nausea And Vomiting   Hydrocodone-acetaminophen Nausea And Vomiting   Oxycodone-acetaminophen Nausea And Vomiting   Tetracyclines & Related Itching   Internal        Medication List     TAKE these medications    amLODipine 5 MG tablet Commonly known as: NORVASC Take 1 tablet (5 mg total) by mouth daily.   blood glucose meter kit and supplies Kit Dispense based on patient and insurance preference. Use up to four times daily as directed.   levothyroxine 100 MCG tablet Commonly known as: SYNTHROID Take 1 tablet (100 mcg total) by mouth daily before breakfast.   metoprolol tartrate 25 MG tablet Commonly known as: LOPRESSOR Take 1 tablet (25 mg total) by mouth 2 (two) times daily.   traMADol 50 MG tablet Commonly known as: Ultram Take 1 tablet (50 mg total) by mouth every 6 (six) hours as needed.   Vitamin D (Ergocalciferol) 1.25 MG (50000 UNIT) Caps capsule Commonly known as: DRISDOL Take 1 capsule (50,000 Units total) by mouth every 7 (seven) days.         Significant Diagnostic Studies:  DG C-Arm 1-60 Min  Result Date: 07/26/2021 CLINICAL DATA:  Percutaneous nephrolithotomy. EXAM: DG C-ARM 1-60 MIN CONTRAST:  Present. FLUOROSCOPY TIME:  Fluoroscopy Time:  54 seconds Radiation Exposure Index (if provided by the fluoroscopic device): Number of Acquired Spot Images: 7 COMPARISON:  CT stone 05/30/2021. FINDINGS: Ureteral stent in place. Percutaneous nephrostomy identified. There is mild to moderate hydronephrosis. IMPRESSION: Fluoroscopy for percutaneous nephrostomy. Electronically Signed    By: Ronney Asters M.D.   On: 07/26/2021 17:10   DG C-Arm 1-60 Min-No Report  Result Date: 07/26/2021 Fluoroscopy was utilized by the requesting physician.  No radiographic interpretation.   IR URETERAL STENT RIGHT NEW ACCESS W/O SEP NEPHROSTOMY CATH  Addendum Date: 07/26/2021   ADDENDUM REPORT: 07/26/2021 13:28 ADDENDUM: CORRECTION TO REPORT RIGHT NEPHROSTOMY ACCESS FOR OPERATIVE STONE REMOVAL Electronically Signed   By: Jerilynn Mages.  Shick M.D.   On: 07/26/2021 13:28   Result Date: 07/26/2021 INDICATION: Large renal pelvis stone EXAM: ULTRASOUND FLUOROSCOPIC LEFT NEPHROSTOMY ACCESS FOR OPERATIVE STONE REMOVAL COMPARISON:  05/30/2021 CT MEDICATIONS: 2 g Rocephin; The antibiotic was administered in an appropriate time frame prior to skin puncture. ANESTHESIA/SEDATION: Fentanyl 100 mcg IV; Versed 2.0 mg IV Moderate Sedation Time:  14 minute The patient was continuously monitored during the procedure by the interventional radiology nurse under my direct supervision. CONTRAST:  10 cc-administered into the collecting system(s) FLUOROSCOPY TIME:  Fluoroscopy Time: 2 minutes 18 seconds (7 mGy). COMPLICATIONS: None immediate. PROCEDURE: Informed written consent was obtained from the patient after a thorough discussion of the procedural risks, benefits and alternatives. All questions were addressed. Maximal Sterile Barrier Technique was utilized including caps, mask, sterile gowns, sterile gloves, sterile drape, hand hygiene and skin antiseptic. A timeout was performed prior to the initiation of the procedure. Previous imaging reviewed. Under sterile conditions and local anesthesia, ultrasound percutaneous needle access performed with an 18 gauge 15 cm needle into a posterior lower pole calyx. Needle position confirmed with ultrasound. Fluoroscopic injection confirms position within a nondilated posterior calyx. Contrast opacifies the renal  pelvis. Renal pelvis stone noted. 018 guidewire advanced followed by the  transitional dilator set. [place] guidewire advanced into the proximal ureter followed by the 5 French catheter. Catheter advanced into the bladder. Position confirmed with fluoroscopy. Images obtained for documentation. Catheter secured with a silk suture and a sterile dressing. External caps applied. No immediate complication. Patient tolerated the procedure well. IMPRESSION: Successful ultrasound fluoroscopic left nephrostomy access for operative stone removal. Electronically Signed: By: Jerilynn Mages.  Shick M.D. On: 07/26/2021 10:48    Brief H and P: For complete details please refer to admission H and P, but in brief patient admitted for management of large right renal calculi  Hospital Course: Had an uncomplicated procedure and postoperative course.  Her nephrostomy tube was removed on postoperative morning #1.  She was taught wound care. Active Problems:   Calculus, renal   Day of Discharge BP (!) 141/67 (BP Location: Right Arm)   Pulse 75   Temp 97.8 F (36.6 C) (Oral)   Resp 17   Ht 5' 6"  (1.676 m)   Wt 68 kg   SpO2 95%   BMI 24.21 kg/m   No results found for this or any previous visit (from the past 24 hour(s)).  Physical Exam: General: Alert and awake oriented x3 not in any acute distress. HEENT: anicteric sclera, pupils reactive to light and accommodation CVS: S1-S2 clear no murmur rubs or gallops Chest: clear to auscultation bilaterally, no wheezing rales or rhonchi Abdomen: soft nontender, nondistended, normal bowel sounds, no organomegaly Extremities: no cyanosis, clubbing or edema noted bilaterally Neuro: Cranial nerves II-XII intact, no focal neurological deficits  Disposition: Home  Diet: No restrictions  Activity: Restrictions discussed with patient   TESTS THAT NEED FOLLOW-UP  She will need eventual metabolic evaluation  DISCHARGE FOLLOW-UP   Follow-up Information     Hollace Hayward, NP Follow up.   Why: 11.14.2022 @ 11 am Contact information: [address] [place] [address] [phone]                 Time spent on Discharge:  15 minutes  Signed: Lillette Boxer Doraine Allen 07/30/2021, 9:19 AM

## 2021-08-13 DIAGNOSIS — N201 Calculus of ureter: Secondary | ICD-10-CM | POA: Diagnosis not present

## 2021-08-13 DIAGNOSIS — N200 Calculus of kidney: Secondary | ICD-10-CM | POA: Diagnosis not present

## 2021-08-22 ENCOUNTER — Telehealth: Payer: Self-pay | Admitting: Nurse Practitioner

## 2021-08-22 MED ORDER — THYROID 60 MG PO TABS: 60.0000 mg | ORAL_TABLET | Freq: Every day | ORAL | 1 refills | Status: DC

## 2021-08-22 NOTE — Telephone Encounter (Signed)
Called patient and gave her the message. Patient verbalized an understanding and thanked [place] and she will let us know if she is still having tummy trouble.

## 2021-08-22 NOTE — Telephone Encounter (Signed)
I remember her telling me she thought she had an issue with the Levothyroxine but she couldn't remember what it was.  I am fine with her switching back to the NP thyroid, it will just be a bit harder to manage her thyroid condition based on labs due to the unpredictable affect they have.  I can send in a new script for the NP thyroid based on what her last dose was.

## 2021-08-22 NOTE — Telephone Encounter (Signed)
Patient states that she has a stomach ache and gas and is almost sure that it is her Levothyroxine causing this. She said she is curious if you are ok with her going back on the NP Thyroid Medication. She was in the hospital and they had changed her medication. Please Advise

## 2021-08-28 DIAGNOSIS — N202 Calculus of kidney with calculus of ureter: Secondary | ICD-10-CM | POA: Diagnosis not present

## 2021-08-28 DIAGNOSIS — M47816 Spondylosis without myelopathy or radiculopathy, lumbar region: Secondary | ICD-10-CM | POA: Diagnosis not present

## 2021-08-28 DIAGNOSIS — N281 Cyst of kidney, acquired: Secondary | ICD-10-CM | POA: Diagnosis not present

## 2021-08-28 DIAGNOSIS — N201 Calculus of ureter: Secondary | ICD-10-CM | POA: Diagnosis not present

## 2021-08-28 DIAGNOSIS — K8020 Calculus of gallbladder without cholecystitis without obstruction: Secondary | ICD-10-CM | POA: Diagnosis not present

## 2021-08-28 DIAGNOSIS — N132 Hydronephrosis with renal and ureteral calculous obstruction: Secondary | ICD-10-CM | POA: Diagnosis not present

## 2021-08-28 NOTE — Telephone Encounter (Signed)
Noted, thanks!

## 2021-08-28 NOTE — Telephone Encounter (Signed)
Pt states she went to the doctor and found out she has another stone in her bladder on the left side and thinks that is where her problems are coming from instead of the Levothyroxine. She said she has not picked up the NP thyroid and wants to know if you want her to try the levothyroxine again.   [phone]

## 2021-08-28 NOTE — Telephone Encounter (Signed)
Yes, please try the Levothyroxine again.  Does she still have some at home or do I need to send in new script?

## 2021-09-05 ENCOUNTER — Other Ambulatory Visit: Payer: Self-pay | Admitting: Internal Medicine

## 2021-09-05 ENCOUNTER — Telehealth: Payer: Self-pay

## 2021-09-05 DIAGNOSIS — I10 Essential (primary) hypertension: Secondary | ICD-10-CM

## 2021-09-05 MED ORDER — AMLODIPINE BESYLATE 10 MG PO TABS: 10.0000 mg | ORAL_TABLET | Freq: Every day | ORAL | 1 refills | Status: DC

## 2021-09-05 NOTE — Telephone Encounter (Signed)
Patient called said the pharmacy will not refill her meds for amLODipine (NORVASC) 5 MG tablet .  Patient said the provider told her to double up at bed time taking 2 pills instead of 1 pill.  Increase to 10 mg of the 5mg .  Patient request a new prescription for 10 mg she has ran out of this medicine.  Pharmacy: [facility]

## 2021-09-25 ENCOUNTER — Other Ambulatory Visit: Payer: Self-pay | Admitting: Internal Medicine

## 2021-09-25 ENCOUNTER — Encounter: Payer: Self-pay | Admitting: Nurse Practitioner

## 2021-09-25 ENCOUNTER — Encounter: Payer: Self-pay | Admitting: Internal Medicine

## 2021-09-25 DIAGNOSIS — N951 Menopausal and female climacteric states: Secondary | ICD-10-CM

## 2021-09-25 MED ORDER — ESTRADIOL 1 MG PO TABS: 1.0000 mg | ORAL_TABLET | Freq: Every day | ORAL | 1 refills | Status: DC

## 2021-09-26 DIAGNOSIS — N200 Calculus of kidney: Secondary | ICD-10-CM | POA: Diagnosis not present

## 2021-09-26 MED ORDER — THYROID 60 MG PO TABS: 60.0000 mg | ORAL_TABLET | Freq: Every day | ORAL | 1 refills | Status: DC

## 2021-09-28 DIAGNOSIS — M25511 Pain in right shoulder: Secondary | ICD-10-CM | POA: Diagnosis not present

## 2021-10-11 ENCOUNTER — Other Ambulatory Visit (HOSPITAL_COMMUNITY): Payer: Self-pay | Admitting: Urology

## 2021-10-11 DIAGNOSIS — R1011 Right upper quadrant pain: Secondary | ICD-10-CM | POA: Diagnosis not present

## 2021-10-16 ENCOUNTER — Ambulatory Visit (HOSPITAL_COMMUNITY)
Admission: RE | Admit: 2021-10-16 | Discharge: 2021-10-16 | Disposition: A | Discharge status: Home / Self Care | Payer: Medicare Other | Source: Ambulatory Visit | Attending: Urology | Admitting: Urology

## 2021-10-16 ENCOUNTER — Other Ambulatory Visit: Payer: Self-pay

## 2021-10-16 DIAGNOSIS — R1011 Right upper quadrant pain: Secondary | ICD-10-CM | POA: Insufficient documentation

## 2021-10-16 DIAGNOSIS — K8020 Calculus of gallbladder without cholecystitis without obstruction: Secondary | ICD-10-CM | POA: Diagnosis not present

## 2021-10-16 DIAGNOSIS — N200 Calculus of kidney: Secondary | ICD-10-CM | POA: Diagnosis not present

## 2021-10-22 ENCOUNTER — Telehealth: Payer: Self-pay

## 2021-10-22 ENCOUNTER — Other Ambulatory Visit: Payer: Self-pay | Admitting: Internal Medicine

## 2021-10-22 ENCOUNTER — Encounter (INDEPENDENT_AMBULATORY_CARE_PROVIDER_SITE_OTHER): Payer: Self-pay | Admitting: *Deleted

## 2021-10-22 DIAGNOSIS — E039 Hypothyroidism, unspecified: Secondary | ICD-10-CM | POA: Diagnosis not present

## 2021-10-22 DIAGNOSIS — E119 Type 2 diabetes mellitus without complications: Secondary | ICD-10-CM | POA: Diagnosis not present

## 2021-10-22 DIAGNOSIS — K219 Gastro-esophageal reflux disease without esophagitis: Secondary | ICD-10-CM

## 2021-10-22 DIAGNOSIS — K760 Fatty (change of) liver, not elsewhere classified: Secondary | ICD-10-CM

## 2021-10-22 MED ORDER — OMEPRAZOLE 20 MG PO CPDR: 20.0000 mg | DELAYED_RELEASE_CAPSULE | Freq: Every day | ORAL | 3 refills | Status: DC

## 2021-10-22 NOTE — Telephone Encounter (Signed)
Pt called and said she was contacted by urology and her ultrasound confirmed Gall Stones - she is having pain in the middle of her stomach as well as Acid reflux.  She said she thinks she needs another referral to a MD that follows Gall Stones.  Please review the Ultrasound that was done on 10/16/21.  The results were send to the wrong MD.

## 2021-10-22 NOTE — Telephone Encounter (Signed)
Patient aware.

## 2021-10-23 LAB — TSH: TSH: 2.12 u[IU]/mL (ref 0.450–4.500)

## 2021-10-23 LAB — COMPREHENSIVE METABOLIC PANEL
ALT: 21 IU/L (ref 0–32)
AST: 19 IU/L (ref 0–40)
Albumin/Globulin Ratio: 2.2 (ref 1.2–2.2)
Albumin: 4.1 g/dL (ref 3.7–4.7)
Alkaline Phosphatase: 75 IU/L (ref 44–121)
BUN/Creatinine Ratio: 17 (ref 12–28)
BUN: 15 mg/dL (ref 8–27)
Bilirubin Total: 0.4 mg/dL (ref 0.0–1.2)
CO2: 24 mmol/L (ref 20–29)
Calcium: 8.4 mg/dL — ABNORMAL LOW (ref 8.7–10.3)
Chloride: 103 mmol/L (ref 96–106)
Creatinine, Ser: 0.9 mg/dL (ref 0.57–1.00)
Globulin, Total: 1.9 g/dL (ref 1.5–4.5)
Glucose: 213 mg/dL — ABNORMAL HIGH (ref 70–99)
Potassium: 4.6 mmol/L (ref 3.5–5.2)
Sodium: 140 mmol/L (ref 134–144)
Total Protein: 6 g/dL (ref 6.0–8.5)
eGFR: 68 mL/min/{1.73_m2} (ref >59)

## 2021-10-23 LAB — T4, FREE: Free T4: 1.26 ng/dL (ref 0.82–1.77)

## 2021-10-29 ENCOUNTER — Encounter: Payer: Self-pay | Admitting: Nurse Practitioner

## 2021-10-29 ENCOUNTER — Ambulatory Visit (INDEPENDENT_AMBULATORY_CARE_PROVIDER_SITE_OTHER): Payer: Medicare Other | Admitting: Nurse Practitioner

## 2021-10-29 ENCOUNTER — Other Ambulatory Visit: Payer: Self-pay

## 2021-10-29 VITALS — BP 124/54 | HR 78 | Ht 66.0 in | Wt 151.0 lb

## 2021-10-29 DIAGNOSIS — E119 Type 2 diabetes mellitus without complications: Secondary | ICD-10-CM

## 2021-10-29 DIAGNOSIS — D351 Benign neoplasm of parathyroid gland: Secondary | ICD-10-CM | POA: Diagnosis not present

## 2021-10-29 DIAGNOSIS — E559 Vitamin D deficiency, unspecified: Secondary | ICD-10-CM | POA: Diagnosis not present

## 2021-10-29 DIAGNOSIS — E039 Hypothyroidism, unspecified: Secondary | ICD-10-CM

## 2021-10-29 LAB — POCT GLYCOSYLATED HEMOGLOBIN (HGB A1C): HbA1c, POC (controlled diabetic range): 8 % — AB (ref 0.0–7.0)

## 2021-10-29 MED ORDER — VITAMIN D (ERGOCALCIFEROL) 1.25 MG (50000 UNIT) PO CAPS: 50000.0000 [IU] | ORAL_CAPSULE | ORAL | 0 refills | Status: DC

## 2021-10-29 MED ORDER — THYROID 60 MG PO TABS: 60.0000 mg | ORAL_TABLET | Freq: Every day | ORAL | 1 refills | Status: DC

## 2021-10-29 NOTE — Progress Notes (Signed)
Endocrinology Follow Up Note                                         10/29/2021, 11:00 AM  Subjective:   Subjective    [name] is a 71 y.o.-year-old female patient being seen in follow up after being seen in consultation for hypothyroidism, diabetes, hx parathyroid adenoma referred by Lindell Spar, MD.   Past Medical History:  Diagnosis Date   Arthritis    Asthma    takes Singulair nightly   Diabetes mellitus    takes levemir daily and Metformin daily   Generalized headaches    GERD (gastroesophageal reflux disease)    takes Omeprazole daily   Herniated disc, cervical    History of kidney stones    Hyperlipidemia    takes Welchol daily   Hypertension    takes Enalapril dialy   Hypothyroidism    takes Synthroid daily   IBS (irritable bowel syndrome)    Joint pain    hands and neck   Kidney stones    Nocturia    PONV (postoperative nausea and vomiting)    TIA (transient ischemic attack)    diagnosed by EEG but never knew she had it   Trigger finger    Weakness    Weakness    in both hands    Past Surgical History:  Procedure Laterality Date   ABDOMINAL HYSTERECTOMY  1980   ANTERIOR CERVICAL DECOMP/DISCECTOMY FUSION N/A 10/18/2013   Procedure: Cervical three-four, Cervical four-five, Cervical seven-Thoracic one anterior cervical decompression with fusion plating and bonegraft;  Surgeon: Hosie Spangle, MD;  Location: [facility];  Service: Neurosurgery;  Laterality: N/A;  Cervical three-four, Cervical four-five, Cervical seven-Thoracic one anterior cervical decompression with fusion plating and bonegraft   APPENDECTOMY     done with hysterectomy   BACK SURGERY  2007   lumbar fusion   carpel tunnel Bilateral    left first then right   cataract implants     COLONOSCOPY     ESOPHAGOGASTRODUODENOSCOPY     hx of h pylori   EYE SURGERY  11/18/2008   right - cataracts   EYE SURGERY   06/01/2009   left - cataracts   herniated disc   10/2000, 08/31/2008   C5-C6 then C6-C7   IR URETERAL STENT RIGHT NEW ACCESS W/O SEP NEPHROSTOMY CATH  07/26/2021   knee anthroscopy bilateral Bilateral    KNEE ARTHROSCOPY     LITHOTRIPSY  11/12/2007   right side   NEPHROLITHOTOMY Right 07/26/2021   Procedure: NEPHROLITHOTOMY PERCUTANEOUS;  Surgeon: Franchot Gallo, MD;  Location: [facility];  Service: Urology;  Laterality: Right;   Parathyroid surgery     October 2013   PARATHYROIDECTOMY  07/02/2012   Procedure: PARATHYROIDECTOMY;  Surgeon: Earnstine Regal, MD;  Location: [facility];  Service: General;  Laterality: N/A;   TONSILLECTOMY     as a child    Social History   Socioeconomic History   Marital status: Widowed  Spouse name: Not on file   Number of children: Not on file   Years of education: Not on file   Highest education level: Not on file  Occupational History   Not on file  Tobacco Use   Smoking status: Former    Packs/day: 1.00    Years: 40.00    Pack years: 40.00    Types: Cigarettes    Quit date: 07/19/2012    Years since quitting: 9.2   Smokeless tobacco: Never   Tobacco comments:    quit smoking in 2012-08-10  Vaping Use   Vaping Use: Never used  Substance and Sexual Activity   Alcohol use: No    Alcohol/week: 0.0 standard drinks   Drug use: No   Sexual activity: Not on file  Other Topics Concern   Not on file  Social History Narrative   Widow,husband died August 11, 2019 after 16 years marriage.Retired.   Social Determinants of Health   Financial Resource Strain: Not on file  Food Insecurity: Not on file  Transportation Needs: Not on file  Physical Activity: Not on file  Stress: Not on file  Social Connections: Not on file    Family History  Problem Relation Age of Onset   Cancer Mother        pancreatic    Outpatient Encounter Medications as of 10/29/2021  Medication Sig   ACCU-CHEK GUIDE test strip USE UP TO FOUR TIMES DAILY AS DIRECTED    amLODipine (NORVASC) 10 MG tablet Take 1 tablet (10 mg total) by mouth daily.   blood glucose meter kit and supplies KIT Dispense based on patient and insurance preference. Use twice daily (before breakfast and before bed).   estradiol (ESTRACE) 1 MG tablet Take 1 tablet (1 mg total) by mouth daily.   metoprolol tartrate (LOPRESSOR) 25 MG tablet Take 1 tablet (25 mg total) by mouth 2 (two) times daily.   omeprazole (PRILOSEC) 20 MG capsule Take 1 capsule (20 mg total) by mouth daily.   traMADol (ULTRAM) 50 MG tablet Take 1 tablet (50 mg total) by mouth every 6 (six) hours as needed.   [DISCONTINUED] thyroid (NP THYROID) 60 MG tablet Take 1 tablet (60 mg total) by mouth daily before breakfast.   [DISCONTINUED] Vitamin D, Ergocalciferol, (DRISDOL) 1.25 MG (50000 UNIT) CAPS capsule Take 1 capsule (50,000 Units total) by mouth every 7 (seven) days.   Potassium Citrate 15 MEQ (1620 MG) TBCR Take 1 tablet by mouth 2 (two) times daily. (Patient not taking: Reported on 10/29/2021)   tamsulosin (FLOMAX) 0.4 MG CAPS capsule Take 0.4 mg by mouth daily. (Patient not taking: Reported on 10/29/2021)   thyroid (NP THYROID) 60 MG tablet Take 1 tablet (60 mg total) by mouth daily before breakfast.   Vitamin D, Ergocalciferol, (DRISDOL) 1.25 MG (50000 UNIT) CAPS capsule Take 1 capsule (50,000 Units total) by mouth every 7 (seven) days.   No facility-administered encounter medications on file as of 10/29/2021.    ALLERGIES: Allergies  Allergen Reactions   Advair Diskus [Fluticasone-Salmeterol] Anaphylaxis   Codeine Nausea And Vomiting   Hydrocodone-Acetaminophen Nausea And Vomiting   Oxycodone-Acetaminophen Nausea And Vomiting   Tetracyclines & Related Itching    Internal   VACCINATION STATUS: Immunization History  Administered Date(s) Administered   Fluad Quad(high Dose 65+) 08/17/2019   Influenza, High Dose Seasonal PF 07/14/2020   Moderna Sars-Covid-2 Vaccination 11/24/2019, 12/22/2019, 08/30/2020,  02/28/2021   Tdap 07/12/2014     Diabetes She presents for her follow-up diabetic visit. She  has type 2 diabetes mellitus. Her disease course has been worsening. There are no hypoglycemic associated symptoms. Pertinent negatives for hypoglycemia include no nervousness/anxiousness. Associated symptoms include fatigue. Pertinent negatives for diabetes include no weight loss. There are no hypoglycemic complications. Symptoms are stable. There are no diabetic complications. Risk factors for coronary artery disease include diabetes mellitus, stress, post-menopausal and hypertension. Current diabetic treatment includes diet. She is compliant with treatment most of the time. Her weight is fluctuating minimally. She is following a generally healthy diet. Meal planning includes avoidance of concentrated sweets. She has not had a previous visit with a dietitian. She participates in exercise intermittently. (She presents today with no meter or logs to review.  She was not asked to routinely monitor glucose due to her successful management with diet/exercise alone.  Her POCT A1c today is 8%, increasing from last visit of 6.6%.  She reports she has been under great amount of stress lately.  She had surgery to remove large kidney stone, found she has others that are also there as well.  She was also found to have a gallstone, currently awaiting GI consultation.  She admits she does not have routine eating pattern, sometimes will not eat hardly anything, and other times will eat whatever she can get her hands on.  ) An ACE inhibitor/angiotensin II receptor blocker is not being taken. She does not see a podiatrist.Eye exam is current.  Thyroid Problem Presents for follow-up visit. Symptoms include depressed mood and fatigue. Patient reports no anxiety, constipation, weight gain or weight loss. The symptoms have been stable.   [name] is a patient with the above medical history. 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 Armandina Gemma.  She is now experiencing similar symptoms that she had at that time and would like to investigate whether or not there could be a problem with one of her other parathyroid glands.  She thinks she remembers being started on thyroid hormone replacement after her parathyroid removal due to possible injury to the thyroid gland during its removal.  She was a patient of Dr. Anastasio Champion who had started the patient on NP thyroid likely as a result of accidental injury to the thyroid tissue during parathyroidectomy surgery   she was given various doses of NP thyroid over the years, currently on 60 mg. she reports compliance to this medication:  Taking it daily on empty stomach  with water, separated by >30 minutes before breakfast and other medications, and by at least 4 hours from calcium, iron, PPIs, multivitamins .  I reviewed patient's thyroid tests:  Lab Results  Component Value Date   TSH 2.120 10/22/2021   TSH 2.930 07/03/2021   TSH 0.57 10/14/2019   TSH 0.15 (L) 06/29/2019   TSH 2.39 06/23/2018   TSH 2.66 04/16/2017   TSH 1.98 10/14/2016   TSH 4.37 04/04/2016   TSH 3.52 01/04/2016   TSH 1.619 09/29/2015   FREET4 1.26 10/22/2021   FREET4 1.23 07/03/2021   FREET4 1.4 06/23/2018   FREET4 1.4 04/16/2017   FREET4 1.5 10/14/2016   FREET4 1.1 04/04/2016   FREET4 1.5 01/04/2016   FREET4 1.15 09/29/2015     Pt describes: - kidney stone - fatigue - body aches  Pt denies feeling nodules in neck, hoarseness,  SOB with lying down.  She does report intermittent trouble swallowing pills, something new.  she denies family history of thyroid disorders.  No family history of thyroid cancer.  No history of radiation therapy to head or neck.  No recent use of iodine supplements.  Denies use of Biotin containing supplements.  I reviewed her chart and she also has a history of parathyroid adenoma, controlled Diabetes without  medications, GERD, HTN.   ROS:  Constitutional: no weight gain/loss, + fatigue, no subjective hyperthermia, no subjective hypothermia Eyes: no blurry vision, no xerophthalmia ENT: no sore throat, no nodules palpated in throat, no dysphagia/odynophagia, no hoarseness Cardiovascular: no chest pain, no SOB, no palpitations, no leg swelling Respiratory: no cough, no SOB Gastrointestinal: no nausea/vomiting/diarrhea, complains of intermittent abdominal pain with nausea at times Musculoskeletal: diffuse muscle/joint aches Skin: no rashes Neurological: no tremors, no numbness, no tingling, no dizziness Psychiatric: + depression, no anxiety, increased amount of stress lately   Objective:   Objective     BP (!) 124/54    Pulse 78    Ht 5' 6"  (1.676 m)    Wt 151 lb (68.5 kg)    SpO2 97%    BMI 24.37 kg/m  Wt Readings from Last 3 Encounters:  10/29/21 151 lb (68.5 kg)  07/26/21 150 lb (68 kg)  07/10/21 149 lb (67.6 kg)    BP Readings from Last 3 Encounters:  10/29/21 (!) 124/54  07/27/21 (!) 141/67  07/26/21 112/82      Physical Exam- Limited  Constitutional:  Body mass index is 24.37 kg/m. , not in acute distress, mildly anxious state of mind Eyes:  EOMI, no exophthalmos Neck: Supple Cardiovascular: RRR, no murmurs, rubs, or gallops, no edema Respiratory: Adequate breathing efforts, no crackles, rales, rhonchi, or wheezing Musculoskeletal: no gross deformities, strength intact in all four extremities, no gross restriction of joint movements Skin:  no rashes, no hyperemia Neurological: no tremor with outstretched hands   CMP ( most recent) CMP     Component Value Date/Time   NA 140 10/22/2021 0855   K 4.6 10/22/2021 0855   CL 103 10/22/2021 0855   CO2 24 10/22/2021 0855   GLUCOSE 213 (H) 10/22/2021 0855   GLUCOSE 211 (H) 07/26/2021 0835   BUN 15 10/22/2021 0855   CREATININE 0.90 10/22/2021 0855   CREATININE 0.81 12/25/2020 1034   CALCIUM 8.4 (L) 10/22/2021 0855    PROT 6.0 10/22/2021 0855   ALBUMIN 4.1 10/22/2021 0855   AST 19 10/22/2021 0855   ALT 21 10/22/2021 0855   ALKPHOS 75 10/22/2021 0855   BILITOT 0.4 10/22/2021 0855   GFRNONAA >60 07/26/2021 0835   GFRNONAA 73 12/25/2020 1034   GFRAA 85 12/25/2020 1034     Diabetic Labs (most recent): Lab Results  Component Value Date   HGBA1C 8.0 (A) 10/29/2021   HGBA1C 6.6 (H) 07/03/2021   HGBA1C 5.9 (H) 12/25/2020     Lipid Panel ( most recent) Lipid Panel     Component Value Date/Time   CHOL 150 12/25/2020 1034   TRIG 177 (H) 12/25/2020 1034   HDL 38 (L) 12/25/2020 1034   CHOLHDL 3.9 12/25/2020 1034   VLDL 33 (H) 10/14/2016 0721   LDLCALC 85 12/25/2020 1034       Lab Results  Component Value Date   TSH 2.120 10/22/2021   TSH 2.930 07/03/2021   TSH 0.57 10/14/2019   TSH 0.15 (L) 06/29/2019   TSH 2.39 06/23/2018   TSH 2.66 04/16/2017   TSH 1.98 10/14/2016   TSH 4.37 04/04/2016   TSH 3.52 01/04/2016   TSH 1.619 09/29/2015   FREET4 1.26 10/22/2021   FREET4 1.23 07/03/2021  FREET4 1.4 06/23/2018   FREET4 1.4 04/16/2017   FREET4 1.5 10/14/2016   FREET4 1.1 04/04/2016   FREET4 1.5 01/04/2016   FREET4 1.15 09/29/2015    US Thyroid 06/26/21 CLINICAL DATA:  History of parathyroid adenoma.   EXAM: THYROID ULTRASOUND   TECHNIQUE: Ultrasound examination of the thyroid gland and adjacent soft tissues was performed.   COMPARISON:  04/23/2011   FINDINGS: Parenchymal Echotexture: Mildly heterogenous   Isthmus: 0.1 cm, previously 0.2 cm   Right lobe: 4.6 x 1.9 x 1.4 cm, previously 4.1 x 1.4 x 1.4 cm   Left lobe: 3.1 x 1.0 x 1.1 cm, previously 3.9 x 1.5 x 1.3 cm   _________________________________________________________   Estimated total number of nodules >/= 1 cm: 0   Number of spongiform nodules >/=  2 cm not described below (TR1): 0   Number of mixed cystic and solid nodules >/= 1.5 cm not described below ([place]): 0    _________________________________________________________   No discrete nodules. Previously there was a nodule in the left thyroid lobe that is no longer present.   IMPRESSION: Thyroid tissue is mildly heterogeneous without discrete thyroid nodules.     Electronically Signed   By: Markus Daft M.D.   On: 06/26/2021 11:17  Assessment & Plan:   ASSESSMENT / PLAN:  1. History of parathyroid adenoma- s/p parathyroidectomy  -Her previsit calcium levels were low at 8.4 and associated PTH was 78, therefore indicating it is not likely her parathyroid causing the problem.  She does have low vitamin D which may be contributing to those levels as well- currently on replacement.  2. Hypothyroidism- unspecified   Patient with long-standing hypothyroidism, on NP thyroid 60 mg.  She historically does not tolerate Levothyroxine (causes significant ankle swelling).  - Her previsit thyroid function tests are consistent with appropriate hormone replacement.  She is advised to continue NP thyroid 60 mg po daily before breakfast.    - We discussed about correct intake of levothyroxine, at fasting, with water, separated by at least 30 minutes from breakfast, and separated by more than 4 hours from calcium, iron, multivitamins, acid reflux medications (PPIs). -Patient is made aware of the fact that thyroid hormone replacement is needed for life, dose to be adjusted by periodic monitoring of thyroid function tests.  3. Uncontrolled Diabetes  She presents today with no meter or logs to review.  She was not asked to routinely monitor glucose due to her successful management with diet/exercise alone.  Her POCT A1c today is 8%, increasing from last visit of 6.6%.  She reports she has been under great amount of stress lately.  She had surgery to remove large kidney stone, found she has others that are also there as well.  She was also found to have a gallstone, currently awaiting GI consultation.  She admits she  does not have routine eating pattern, sometimes will not eat hardly anything, and other times will eat whatever she can get her hands on.    - Nutritional counseling repeated at each appointment due to patients tendency to fall back in to old habits.  - The patient admits there is a room for improvement in their diet and drink choices. -  Suggestion is made for the patient to avoid simple carbohydrates from their diet including Cakes, Sweet Desserts / Pastries, Ice Cream, Soda (diet and regular), Sweet Tea, Candies, Chips, Cookies, Sweet Pastries, Store Bought Juices, Alcohol in Excess of 1-2 drinks a day, Artificial Sweeteners, Coffee Creamer, and "Sugar-free" Products.  This will help patient to have stable blood glucose profile and potentially avoid unintended weight gain.   - I encouraged the patient to switch to unprocessed or minimally processed complex starch and increased protein intake (animal or plant source), fruits, and vegetables.   - Patient is advised to stick to a routine mealtimes to eat 3 meals a day and avoid unnecessary snacks (to snack only to correct hypoglycemia).  4. Vitamin D deficiency Her most recent vitamin D level was 18.7 on 07/03/21.  She reportedly does not tolerate OTC vitamin D, causes upset stomach and diarrhea.  I did initiate replenishment with Ergocalciferol 50000 units PO weekly.  Will repeat vitamin D level prior to next visit.      I spent 40 minutes in the care of the patient today including review of labs from [facility], Lipids, Thyroid Function, Hematology (current and previous including abstractions from other facilities); face-to-face time discussing  her blood glucose readings/logs, discussing hypoglycemia and hyperglycemia episodes and symptoms, medications doses, her options of short and long term treatment based on the latest standards of care / guidelines;  discussion about incorporating lifestyle medicine;  and documenting the encounter.    Please refer  to Patient Instructions for Blood Glucose Monitoring and Insulin/Medications Dosing Guide"  in media tab for additional information. Please  also refer to " Patient Self Inventory" in the Media  tab for reviewed elements of pertinent patient history.  [name] participated in the discussions, expressed understanding, and voiced agreement with the above plans.  All questions were answered to her satisfaction. she is encouraged to contact clinic should she have any questions or concerns prior to her return visit.   FOLLOW UP PLAN:  Return in about 3 months (around 01/27/2022) for Diabetes F/U with A1c in office, Previsit labs, Bring meter and logs.   Rayetta Pigg, [facility] [address] [place], [place] [address] Phone: [phone] Fax: [phone]  10/29/2021, 11:00 AM

## 2021-10-29 NOTE — Patient Instructions (Signed)

## 2021-11-05 ENCOUNTER — Other Ambulatory Visit: Payer: Self-pay

## 2021-11-05 ENCOUNTER — Ambulatory Visit (INDEPENDENT_AMBULATORY_CARE_PROVIDER_SITE_OTHER): Payer: Medicare Other | Admitting: Internal Medicine

## 2021-11-05 ENCOUNTER — Encounter: Payer: Self-pay | Admitting: Internal Medicine

## 2021-11-05 VITALS — BP 132/58 | HR 79 | Resp 18 | Ht 66.0 in | Wt 152.0 lb

## 2021-11-05 DIAGNOSIS — E039 Hypothyroidism, unspecified: Secondary | ICD-10-CM

## 2021-11-05 DIAGNOSIS — E1169 Type 2 diabetes mellitus with other specified complication: Secondary | ICD-10-CM | POA: Diagnosis not present

## 2021-11-05 DIAGNOSIS — K8020 Calculus of gallbladder without cholecystitis without obstruction: Secondary | ICD-10-CM | POA: Insufficient documentation

## 2021-11-05 DIAGNOSIS — K219 Gastro-esophageal reflux disease without esophagitis: Secondary | ICD-10-CM

## 2021-11-05 DIAGNOSIS — I10 Essential (primary) hypertension: Secondary | ICD-10-CM | POA: Diagnosis not present

## 2021-11-05 DIAGNOSIS — N200 Calculus of kidney: Secondary | ICD-10-CM | POA: Diagnosis not present

## 2021-11-05 MED ORDER — METOPROLOL TARTRATE 25 MG PO TABS: 25.0000 mg | ORAL_TABLET | Freq: Two times a day (BID) | ORAL | 3 refills | Status: DC

## 2021-11-05 MED ORDER — TRAMADOL HCL 50 MG PO TABS: 50.0000 mg | ORAL_TABLET | Freq: Four times a day (QID) | ORAL | 0 refills | Status: DC | PRN

## 2021-11-05 NOTE — Progress Notes (Signed)
Established Patient Office Visit  Subjective:  Patient ID: [name], female    DOB: [birth year]  Age: 71 y.o. MRN: [id number]  CC:  Chief Complaint  Patient presents with   Follow-up    4 month follow up HTN and DM has kidney stones on right and gallstone on right is seeing urology     HPI [name] is a 71 y.o. female with past medical history of HTN, GERD, type II DM, hypothyroidism, HLD and parathyroid adenoma who presents for f/u of her chronic medical conditions.  HTN: BP is well-controlled. Takes medications regularly. Patient denies headache, dizziness, chest pain, dyspnea or palpitations.  [name] complains of epigastric and RUQ abdominal pain for last few weeks.  [name] recently had [place] of abdomen for nephrolithiasis and was found to have fatty liver disease with gallstones.  [name] was referred to GI for fatty liver disease.  Although [place] of abdomen did not show signs of cholecystitis, her pain is colicky in nature.  [name] is still on NP thyroid for hypothyroidism.  Followed by endocrinology.   Past Medical History:  Diagnosis Date   Arthritis    Asthma    takes Singulair nightly   Diabetes mellitus    takes levemir daily and Metformin daily   Generalized headaches    GERD (gastroesophageal reflux disease)    takes Omeprazole daily   Herniated disc, cervical    History of kidney stones    Hyperlipidemia    takes Welchol daily   Hypertension    takes Enalapril dialy   Hypothyroidism    takes Synthroid daily   IBS (irritable bowel syndrome)    Joint pain    hands and neck   Kidney stones    Nocturia    PONV (postoperative nausea and vomiting)    TIA (transient ischemic attack)    diagnosed by EEG but never knew [name] had it   Trigger finger    Weakness    Weakness    in both hands    Past Surgical History:  Procedure Laterality Date   ABDOMINAL HYSTERECTOMY  1980   ANTERIOR CERVICAL DECOMP/DISCECTOMY FUSION N/A 10/18/2013   Procedure: Cervical three-four,  Cervical four-five, Cervical seven-Thoracic one anterior cervical decompression with fusion plating and bonegraft;  Surgeon: Hosie Spangle, MD;  Location: [facility];  Service: Neurosurgery;  Laterality: N/A;  Cervical three-four, Cervical four-five, Cervical seven-Thoracic one anterior cervical decompression with fusion plating and bonegraft   APPENDECTOMY     done with hysterectomy   BACK SURGERY  2007   lumbar fusion   carpel tunnel Bilateral    left first then right   cataract implants     COLONOSCOPY     ESOPHAGOGASTRODUODENOSCOPY     hx of h pylori   EYE SURGERY  11/18/2008   right - cataracts   EYE SURGERY  06/01/2009   left - cataracts   herniated disc   10/2000, 08/31/2008   C5-C6 then C6-C7   IR URETERAL STENT RIGHT NEW ACCESS W/O SEP NEPHROSTOMY CATH  07/26/2021   knee anthroscopy bilateral Bilateral    KNEE ARTHROSCOPY     LITHOTRIPSY  11/12/2007   right side   NEPHROLITHOTOMY Right 07/26/2021   Procedure: NEPHROLITHOTOMY PERCUTANEOUS;  Surgeon: Franchot Gallo, MD;  Location: [facility];  Service: Urology;  Laterality: Right;   Parathyroid surgery     October 2013   PARATHYROIDECTOMY  07/02/2012   Procedure: PARATHYROIDECTOMY;  Surgeon: Earnstine Regal, MD;  Location: [facility];  Service: General;  Laterality: N/A;   TONSILLECTOMY     as a child    Family History  Problem Relation Age of Onset   Cancer Mother        pancreatic    Social History   Socioeconomic History   Marital status: Widowed    Spouse name: Not on file   Number of children: Not on file   Years of education: Not on file   Highest education level: Not on file  Occupational History   Not on file  Tobacco Use   Smoking status: Former    Packs/day: 1.00    Years: 40.00    Pack years: 40.00    Types: Cigarettes    Quit date: 07/19/2012    Years since quitting: 9.3   Smokeless tobacco: Never   Tobacco comments:    quit smoking in 07-09-12  Vaping Use   Vaping Use: Never used  Substance  and Sexual Activity   Alcohol use: No    Alcohol/week: 0.0 standard drinks   Drug use: No   Sexual activity: Not on file  Other Topics Concern   Not on file  Social History Narrative   Widow,husband died 2019/07/10 after 3 years marriage.Retired.   Social Determinants of Health   Financial Resource Strain: Not on file  Food Insecurity: Not on file  Transportation Needs: Not on file  Physical Activity: Not on file  Stress: Not on file  Social Connections: Not on file  Intimate Partner Violence: Not on file    Outpatient Medications Prior to Visit  Medication Sig Dispense Refill   ACCU-CHEK GUIDE test strip USE UP TO FOUR TIMES DAILY AS DIRECTED     amLODipine (NORVASC) 10 MG tablet Take 1 tablet (10 mg total) by mouth daily. 90 tablet 1   blood glucose meter kit and supplies KIT Dispense based on patient and insurance preference. Use twice daily (before breakfast and before bed). 1 each 0   estradiol (ESTRACE) 1 MG tablet Take 1 tablet (1 mg total) by mouth daily. 90 tablet 1   omeprazole (PRILOSEC) 20 MG capsule Take 1 capsule (20 mg total) by mouth daily. 30 capsule 3   Potassium Citrate 15 MEQ (1620 MG) TBCR Take 1 tablet by mouth 2 (two) times daily.     tamsulosin (FLOMAX) 0.4 MG CAPS capsule Take 0.4 mg by mouth daily. (Patient not taking: Reported on 11/06/2021)     thyroid (NP THYROID) 60 MG tablet Take 1 tablet (60 mg total) by mouth daily before breakfast. 90 tablet 1   Vitamin D, Ergocalciferol, (DRISDOL) 1.25 MG (50000 UNIT) CAPS capsule Take 1 capsule (50,000 Units total) by mouth every 7 (seven) days. 12 capsule 0   metoprolol tartrate (LOPRESSOR) 25 MG tablet Take 1 tablet (25 mg total) by mouth 2 (two) times daily. 60 tablet 5   traMADol (ULTRAM) 50 MG tablet Take 1 tablet (50 mg total) by mouth every 6 (six) hours as needed. 15 tablet 0   No facility-administered medications prior to visit.    Allergies  Allergen Reactions   Advair Diskus  [Fluticasone-Salmeterol] Anaphylaxis   Codeine Nausea And Vomiting   Hydrocodone-Acetaminophen Nausea And Vomiting   Oxycodone-Acetaminophen Nausea And Vomiting   Tetracyclines & Related Itching    Internal    ROS Review of Systems  Constitutional:  Positive for fatigue. Negative for chills and fever.  HENT:  Negative for congestion, sinus pressure, sinus pain and sore throat.   Eyes:  Negative for pain and discharge.  Respiratory:  Negative for cough and shortness of breath.   Cardiovascular:  Negative for chest pain and palpitations.  Gastrointestinal:  Positive for abdominal pain. Negative for diarrhea, nausea and vomiting.  Endocrine: Negative for polydipsia and polyuria.  Genitourinary:  Negative for dysuria and hematuria.  Musculoskeletal:  Positive for arthralgias and myalgias. Negative for neck pain and neck stiffness.  Skin:  Negative for rash.  Neurological:  Negative for dizziness and weakness.  Psychiatric/Behavioral:  Negative for agitation and behavioral problems.      Objective:    Physical Exam Vitals reviewed.  Constitutional:      General: [name] is not in acute distress.    Appearance: [name] is not diaphoretic.  HENT:     Head: Normocephalic and atraumatic.     Nose: Nose normal.     Mouth/Throat:     Mouth: Mucous membranes are moist.  Eyes:     General: No scleral icterus.    Extraocular Movements: Extraocular movements intact.  Neck:     Comments: Scar noted, site C/D/I Cardiovascular:     Rate and Rhythm: Normal rate and regular rhythm.     Pulses: Normal pulses.     Heart sounds: Normal heart sounds. No murmur heard. Pulmonary:     Breath sounds: Normal breath sounds. No wheezing or rales.  Musculoskeletal:     Cervical back: Neck supple. No tenderness.     Right lower leg: No edema.     Left lower leg: No edema.  Skin:    General: Skin is warm.     Findings: No rash.  Neurological:     General: No focal deficit present.     Mental Status:  [name] is alert and oriented to person, place, and time.  Psychiatric:        Mood and Affect: Mood normal.        Behavior: Behavior normal.    BP (!) 132/58 (BP Location: Left Arm, Cuff Size: Normal)    Pulse 79    Resp 18    Ht _0  (1.676 m)    Wt 152 lb (68.9 kg)    SpO2 99%    BMI 24.53 kg/m  Wt Readings from Last 3 Encounters:  11/06/21 152 lb (68.9 kg)  11/05/21 152 lb (68.9 kg)  10/29/21 151 lb (68.5 kg)    Lab Results  Component Value Date   TSH 2.120 10/22/2021   Lab Results  Component Value Date   WBC 5.9 07/26/2021   HGB 13.1 07/27/2021   HCT 40.2 07/27/2021   MCV 88.2 07/26/2021   PLT 196 07/26/2021   Lab Results  Component Value Date   NA 140 10/22/2021   K 4.6 10/22/2021   CO2 24 10/22/2021   GLUCOSE 213 (H) 10/22/2021   BUN 15 10/22/2021   CREATININE 0.90 10/22/2021   BILITOT 0.4 10/22/2021   ALKPHOS 75 10/22/2021   AST 19 10/22/2021   ALT 21 10/22/2021   PROT 6.0 10/22/2021   ALBUMIN 4.1 10/22/2021   CALCIUM 8.4 (L) 10/22/2021   ANIONGAP 6 07/26/2021   EGFR 68 10/22/2021   Lab Results  Component Value Date   CHOL 150 12/25/2020   Lab Results  Component Value Date   HDL 38 (L) 12/25/2020   Lab Results  Component Value Date   LDLCALC 85 12/25/2020   Lab Results  Component Value Date   TRIG 177 (H) 12/25/2020   Lab Results  Component Value Date  CHOLHDL 3.9 12/25/2020   Lab Results  Component Value Date   HGBA1C 8.0 (A) 10/29/2021      Assessment & Plan:   Problem List Items Addressed This Visit       Cardiovascular and Mediastinum   Benign hypertension - Primary    BP Readings from Last 1 Encounters:  11/06/21 125/64  Overall remains well-controlled with Amlodipine and Metoprolol Counseled for compliance with the medications Advised DASH diet and moderate exercise/walking, at least 150 mins/week       Relevant Medications   metoprolol tartrate (LOPRESSOR) 25 MG tablet     Digestive   GERD (gastroesophageal reflux  disease)    Better controlled with omeprazole now      Calculus of gallbladder without cholecystitis without obstruction    Referred to general surgery as [name] has colicky pain      Relevant Orders   Ambulatory referral to General Surgery     Endocrine   Type 2 diabetes mellitus with other specified complication ([place])    Lab Results  Component Value Date   HGBA1C 8.0 (A) 10/29/2021  Diet controlled Was on medications in the past Does not prefer to take medications for DM or statin, agrees to follow strict low-carb diet for now Advised to follow up with Ophthalmology for diabetic eye exam.      Primary hypothyroidism    On NP thyroid Chart review suggests normal TSH Plan to down titrate NP thyroid and switch to Levothyroxine if abnormal TSH -followed by Endocrinology Check TSH and free T4      Relevant Medications   metoprolol tartrate (LOPRESSOR) 25 MG tablet     Genitourinary   Calculus, renal    Followed by urology Refilled tramadol as needed for flank pain      Relevant Medications   traMADol (ULTRAM) 50 MG tablet    Meds ordered this encounter  Medications   metoprolol tartrate (LOPRESSOR) 25 MG tablet    Sig: Take 1 tablet (25 mg total) by mouth 2 (two) times daily.    Dispense:  180 tablet    Refill:  3   traMADol (ULTRAM) 50 MG tablet    Sig: Take 1 tablet (50 mg total) by mouth every 6 (six) hours as needed.    Dispense:  15 tablet    Refill:  0    Follow-up: Return in about 6 months (around 05/05/2022).    Lindell Spar, MD

## 2021-11-05 NOTE — Patient Instructions (Signed)
Please continue taking medications as prescribed.  You are being referred to General surgeon for gallstones.

## 2021-11-06 ENCOUNTER — Encounter: Payer: Self-pay | Admitting: Surgery

## 2021-11-06 ENCOUNTER — Ambulatory Visit (INDEPENDENT_AMBULATORY_CARE_PROVIDER_SITE_OTHER): Payer: Medicare Other | Admitting: Surgery

## 2021-11-06 VITALS — BP 125/64 | HR 69 | Temp 97.8°F | Resp 14 | Ht 66.0 in | Wt 152.0 lb

## 2021-11-06 DIAGNOSIS — K8020 Calculus of gallbladder without cholecystitis without obstruction: Secondary | ICD-10-CM

## 2021-11-06 NOTE — Progress Notes (Addendum)
[facility] History and Physical  Reason for Referral: Cholelithiasis Referring Physician: Ihor Dow, MD  Chief Complaint   New Patient (Initial Visit)     [name] is a 71 y.o. female.  HPI: Patient presents for cholelithiasis.  For the last several months, she has been having right-sided abdominal pain.  She notes that when she starts to develop the pain, she will feel the urge to burp or pass flatus, and after doing so it does occasionally help her pain.  She denies association of her abdominal pain with eating.  She describes the pain as a sharp aching pain in the right side of her abdomen, and occasionally, it is in the center of her abdomen and radiates up to her epigastrium and right upper quadrant.  She very rarely has episodes of nausea, but denies any episodes of vomiting.  She does complain of right-sided shoulder and shoulder blade pain, though she does have some shoulder arthritis requiring steroid injection in the past.  She has a recent history of right-sided kidney stone, requiring lithotripsy for treatment.  She also has some left-sided kidney stones, which are slowly passing on their own.  Her medical history significant for hypertension and hypothyroidism.  Her surgical history significant for an abdominal hysterectomy and appendectomy done at that time.  She denies use of blood thinning medications.  She denies use of tobacco products and illicit drugs.  She very occasionally drinks alcohol.  She usually takes tramadol for her pain, as she does not tolerate oxycodone products.  Past Medical History:  Diagnosis Date   Arthritis    Asthma    takes Singulair nightly   Diabetes mellitus    takes levemir daily and Metformin daily   Generalized headaches    GERD (gastroesophageal reflux disease)    takes Omeprazole daily   Herniated disc, cervical    History of kidney stones    Hyperlipidemia    takes Welchol daily   Hypertension    takes  Enalapril dialy   Hypothyroidism    takes Synthroid daily   IBS (irritable bowel syndrome)    Joint pain    hands and neck   Kidney stones    Nocturia    PONV (postoperative nausea and vomiting)    TIA (transient ischemic attack)    diagnosed by EEG but never knew she had it   Trigger finger    Weakness    Weakness    in both hands    Past Surgical History:  Procedure Laterality Date   ABDOMINAL HYSTERECTOMY  1980   ANTERIOR CERVICAL DECOMP/DISCECTOMY FUSION N/A 10/18/2013   Procedure: Cervical three-four, Cervical four-five, Cervical seven-Thoracic one anterior cervical decompression with fusion plating and bonegraft;  Surgeon: Hosie Spangle, MD;  Location: [facility];  Service: Neurosurgery;  Laterality: N/A;  Cervical three-four, Cervical four-five, Cervical seven-Thoracic one anterior cervical decompression with fusion plating and bonegraft   APPENDECTOMY     done with hysterectomy   BACK SURGERY  2007   lumbar fusion   carpel tunnel Bilateral    left first then right   cataract implants     COLONOSCOPY     ESOPHAGOGASTRODUODENOSCOPY     hx of h pylori   EYE SURGERY  11/18/2008   right - cataracts   EYE SURGERY  06/01/2009   left - cataracts   herniated disc   10/2000, 08/31/2008   C5-C6 then C6-C7   IR URETERAL STENT RIGHT NEW ACCESS W/O SEP NEPHROSTOMY  CATH  07/26/2021   knee anthroscopy bilateral Bilateral    KNEE ARTHROSCOPY     LITHOTRIPSY  11/12/2007   right side   NEPHROLITHOTOMY Right 07/26/2021   Procedure: NEPHROLITHOTOMY PERCUTANEOUS;  Surgeon: Franchot Gallo, MD;  Location: [facility];  Service: Urology;  Laterality: Right;   Parathyroid surgery     October 2013   PARATHYROIDECTOMY  07/02/2012   Procedure: PARATHYROIDECTOMY;  Surgeon: Earnstine Regal, MD;  Location: [facility];  Service: General;  Laterality: N/A;   TONSILLECTOMY     as a child    Family History  Problem Relation Age of Onset   Cancer Mother        pancreatic    Social History    Tobacco Use   Smoking status: Former    Packs/day: 1.00    Years: 40.00    Pack years: 40.00    Types: Cigarettes    Quit date: 07/19/2012    Years since quitting: 9.3   Smokeless tobacco: Never   Tobacco comments:    quit smoking in Oct 2013  Vaping Use   Vaping Use: Never used  Substance Use Topics   Alcohol use: No    Alcohol/week: 0.0 standard drinks   Drug use: No    Medications: I have reviewed the patient's current medications. Allergies as of 11/06/2021       Reactions   Advair Diskus [fluticasone-salmeterol] Anaphylaxis   Codeine Nausea And Vomiting   Hydrocodone-acetaminophen Nausea And Vomiting   Oxycodone-acetaminophen Nausea And Vomiting   Tetracyclines & Related Itching   Internal        Medication List        Accurate as of November 06, 2021  1:09 PM. If you have any questions, ask your nurse or doctor.          Accu-Chek Guide test strip Generic drug: glucose blood USE UP TO FOUR TIMES DAILY AS DIRECTED   amLODipine 10 MG tablet Commonly known as: NORVASC Take 1 tablet (10 mg total) by mouth daily.   blood glucose meter kit and supplies Kit Dispense based on patient and insurance preference. Use twice daily (before breakfast and before bed).   estradiol 1 MG tablet Commonly known as: Estrace Take 1 tablet (1 mg total) by mouth daily.   metoprolol tartrate 25 MG tablet Commonly known as: LOPRESSOR Take 1 tablet (25 mg total) by mouth 2 (two) times daily.   omeprazole 20 MG capsule Commonly known as: PRILOSEC Take 1 capsule (20 mg total) by mouth daily.   Potassium Citrate 15 MEQ (1620 MG) Tbcr Take 1 tablet by mouth 2 (two) times daily.   tamsulosin 0.4 MG Caps capsule Commonly known as: FLOMAX Take 0.4 mg by mouth daily.   thyroid 60 MG tablet Commonly known as: NP Thyroid Take 1 tablet (60 mg total) by mouth daily before breakfast.   traMADol 50 MG tablet Commonly known as: Ultram Take 1 tablet (50 mg total) by mouth  every 6 (six) hours as needed.   Vitamin D (Ergocalciferol) 1.25 MG (50000 UNIT) Caps capsule Commonly known as: DRISDOL Take 1 capsule (50,000 Units total) by mouth every 7 (seven) days.         ROS:  Constitutional: negative for chills, fatigue, and fevers Eyes: positive for blurred vision, negative for double vision and pain Ears, nose, mouth, throat, and face: negative for ear drainage, sore throat, and sinus problems Respiratory: positive for shortness of breath, negative for cough and wheezing Cardiovascular: negative for chest  pain and palpitations Gastrointestinal: positive for abdominal pain and reflux symptoms, negative for nausea and vomiting Genitourinary:negative for dysuria, frequency, and urinary retention Integument/breast: positive for dryness, negative for rash Hematologic/lymphatic: negative for bleeding and lymphadenopathy Musculoskeletal:positive for back pain, neck pain, and joint Neurological: negative for weakness and numbness Endocrine: negative for temperature intolerance  Blood pressure 125/64, pulse 69, temperature 97.8 F (36.6 C), temperature source Other (Comment), resp. rate 14, height 5' 6"  (1.676 m), weight 152 lb (68.9 kg), SpO2 95 %. Physical Exam Vitals reviewed.  Constitutional:      Appearance: Normal appearance.  HENT:     Head: Normocephalic and atraumatic.  Eyes:     Extraocular Movements: Extraocular movements intact.     Pupils: Pupils are equal, round, and reactive to light.  Cardiovascular:     Rate and Rhythm: Normal rate and regular rhythm.  Pulmonary:     Effort: Pulmonary effort is normal.     Breath sounds: Normal breath sounds.  Abdominal:     Comments: Abdomen soft, nondistended, no percussion tenderness, mild tenderness to palpation in the right upper quadrant, negative Murphy's, no rigidity, guarding, rebound tenderness  Musculoskeletal:        General: Normal range of motion.     Cervical back: Normal range of  motion.  Skin:    General: Skin is warm and dry.  Neurological:     General: No focal deficit present.     Mental Status: She is alert and oriented to person, place, and time.  Psychiatric:        Mood and Affect: Mood normal.        Behavior: Behavior normal.    Results: Abdominal US (10/16/21): IMPRESSION: 1. Bilateral renal calculi.  No hydronephrosis. 2. Echogenic liver likely related to diffuse hepatocellular disease such as fatty infiltration. 3. Cholelithiasis. No additional sonographic evidence for acute cholecystitis.  Assessment & Plan:  [name] is a 71 y.o. female who presents for evaluation of her abdominal pain and cholelithiasis.  -While the patient has multiple complaints, I did explain that her gallstones could be at least partially contributing to her abdominal pain.  Given that, we discussed the possibilities of laparoscopic cholecystectomy.  After discussions, she is agreeable to proceed with the procedure, as she would like at least a little bit of improvement even if her gallbladder is not the complete source of her abdominal complaints -I counseled the patient about the indication, risks and benefits of laparoscopic cholecystectomy.  She understands there is a very small chance for bleeding, infection, injury to normal structures (including common bile duct), conversion to open surgery, persistent symptoms, evolution of postcholecystectomy diarrhea, need for secondary interventions, anesthesia reaction, cardiopulmonary issues and other risks not specifically detailed here. I described the expected recovery, the plan for follow-up and the restrictions during the recovery phase.  All questions were answered. -Patient tentatively scheduled for laparoscopic cholecystectomy on 11/22/2021  All questions were answered to the satisfaction of the patient.  Graciella Freer, DO [facility] [address] [place], [place]  [id number] [phone] (office)

## 2021-11-07 NOTE — H&P (Addendum)
[facility] History and Physical   Reason for Referral: Cholelithiasis Referring Physician: Ihor Dow, MD   Chief Complaint   New Patient (Initial Visit)        [name] is a 71 y.o. female.  HPI: Patient presents for cholelithiasis.  For the last several months, she has been having right-sided abdominal pain.  She notes that when she starts to develop the pain, she will feel the urge to burp or pass flatus, and after doing so it does occasionally help her pain.  She denies association of her abdominal pain with eating.  She describes the pain as a sharp aching pain in the right side of her abdomen, and occasionally, it is in the center of her abdomen and radiates up to her epigastrium and right upper quadrant.  She very rarely has episodes of nausea, but denies any episodes of vomiting.  She does complain of right-sided shoulder and shoulder blade pain, though she does have some shoulder arthritis requiring steroid injection in the past.  She has a recent history of right-sided kidney stone, requiring lithotripsy for treatment.  She also has some left-sided kidney stones, which are slowly passing on their own.  Her medical history significant for hypertension and hypothyroidism.  Her surgical history significant for an abdominal hysterectomy and appendectomy done at that time.  She denies use of blood thinning medications.  She denies use of tobacco products and illicit drugs.  She very occasionally drinks alcohol.  She usually takes tramadol for her pain, as she does not tolerate oxycodone products.       Past Medical History:  Diagnosis Date   Arthritis     Asthma      takes Singulair nightly   Diabetes mellitus      takes levemir daily and Metformin daily   Generalized headaches     GERD (gastroesophageal reflux disease)      takes Omeprazole daily   Herniated disc, cervical     History of kidney stones     Hyperlipidemia      takes Welchol daily    Hypertension      takes Enalapril dialy   Hypothyroidism      takes Synthroid daily   IBS (irritable bowel syndrome)     Joint pain      hands and neck   Kidney stones     Nocturia     PONV (postoperative nausea and vomiting)     TIA (transient ischemic attack)      diagnosed by EEG but never knew she had it   Trigger finger     Weakness     Weakness      in both hands           Past Surgical History:  Procedure Laterality Date   ABDOMINAL HYSTERECTOMY   1980   ANTERIOR CERVICAL DECOMP/DISCECTOMY FUSION N/A 10/18/2013    Procedure: Cervical three-four, Cervical four-five, Cervical seven-Thoracic one anterior cervical decompression with fusion plating and bonegraft;  Surgeon: Hosie Spangle, MD;  Location: [facility];  Service: Neurosurgery;  Laterality: N/A;  Cervical three-four, Cervical four-five, Cervical seven-Thoracic one anterior cervical decompression with fusion plating and bonegraft   APPENDECTOMY        done with hysterectomy   BACK SURGERY   2007    lumbar fusion   carpel tunnel Bilateral      left first then right   cataract implants       COLONOSCOPY  ESOPHAGOGASTRODUODENOSCOPY        hx of h pylori   EYE SURGERY   11/18/2008    right - cataracts   EYE SURGERY   06/01/2009    left - cataracts   herniated disc    10/2000, 08/31/2008    C5-C6 then C6-C7   IR URETERAL STENT RIGHT NEW ACCESS W/O SEP NEPHROSTOMY CATH   07/26/2021   knee anthroscopy bilateral Bilateral     KNEE ARTHROSCOPY       LITHOTRIPSY   11/12/2007    right side   NEPHROLITHOTOMY Right 07/26/2021    Procedure: NEPHROLITHOTOMY PERCUTANEOUS;  Surgeon: Franchot Gallo, MD;  Location: [facility];  Service: Urology;  Laterality: Right;   Parathyroid surgery        October 2013   PARATHYROIDECTOMY   07/02/2012    Procedure: PARATHYROIDECTOMY;  Surgeon: Earnstine Regal, MD;  Location: [facility];  Service: General;  Laterality: N/A;   TONSILLECTOMY        as a child           Family History   Problem Relation Age of Onset   Cancer Mother          pancreatic      Social History         Tobacco Use   Smoking status: Former      Packs/day: 1.00      Years: 40.00      Pack years: 40.00      Types: Cigarettes      Quit date: 07/19/2012      Years since quitting: 9.3   Smokeless tobacco: Never   Tobacco comments:      quit smoking in Oct 2013  Vaping Use   Vaping Use: Never used  Substance Use Topics   Alcohol use: No      Alcohol/week: 0.0 standard drinks   Drug use: No      Medications: I have reviewed the patient's current medications. Allergies as of 11/06/2021         Reactions    Advair Diskus [fluticasone-salmeterol] Anaphylaxis    Codeine Nausea And Vomiting    Hydrocodone-acetaminophen Nausea And Vomiting    Oxycodone-acetaminophen Nausea And Vomiting    Tetracyclines & Related Itching    Internal            Medication List           Accurate as of November 06, 2021  1:09 PM. If you have any questions, ask your nurse or doctor.              Accu-Chek Guide test strip Generic drug: glucose blood USE UP TO FOUR TIMES DAILY AS DIRECTED    amLODipine 10 MG tablet Commonly known as: NORVASC Take 1 tablet (10 mg total) by mouth daily.    blood glucose meter kit and supplies Kit Dispense based on patient and insurance preference. Use twice daily (before breakfast and before bed).    estradiol 1 MG tablet Commonly known as: Estrace Take 1 tablet (1 mg total) by mouth daily.    metoprolol tartrate 25 MG tablet Commonly known as: LOPRESSOR Take 1 tablet (25 mg total) by mouth 2 (two) times daily.    omeprazole 20 MG capsule Commonly known as: PRILOSEC Take 1 capsule (20 mg total) by mouth daily.    Potassium Citrate 15 MEQ (1620 MG) Tbcr Take 1 tablet by mouth 2 (two) times daily.    tamsulosin 0.4 MG Caps capsule Commonly known  as: FLOMAX Take 0.4 mg by mouth daily.    thyroid 60 MG tablet Commonly known as: NP Thyroid Take 1  tablet (60 mg total) by mouth daily before breakfast.    traMADol 50 MG tablet Commonly known as: Ultram Take 1 tablet (50 mg total) by mouth every 6 (six) hours as needed.    Vitamin D (Ergocalciferol) 1.25 MG (50000 UNIT) Caps capsule Commonly known as: DRISDOL Take 1 capsule (50,000 Units total) by mouth every 7 (seven) days.               ROS:  Constitutional: negative for chills, fatigue, and fevers Eyes: positive for blurred vision, negative for double vision and pain Ears, nose, mouth, throat, and face: negative for ear drainage, sore throat, and sinus problems Respiratory: positive for shortness of breath, negative for cough and wheezing Cardiovascular: negative for chest pain and palpitations Gastrointestinal: positive for abdominal pain and reflux symptoms, negative for nausea and vomiting Genitourinary:negative for dysuria, frequency, and urinary retention Integument/breast: positive for dryness, negative for rash Hematologic/lymphatic: negative for bleeding and lymphadenopathy Musculoskeletal:positive for back pain, neck pain, and joint Neurological: negative for weakness and numbness Endocrine: negative for temperature intolerance   Blood pressure 125/64, pulse 69, temperature 97.8 F (36.6 C), temperature source Other (Comment), resp. rate 14, height 5' 6"  (1.676 m), weight 152 lb (68.9 kg), SpO2 95 %. Physical Exam Vitals reviewed.  Constitutional:      Appearance: Normal appearance.  HENT:     Head: Normocephalic and atraumatic.  Eyes:     Extraocular Movements: Extraocular movements intact.     Pupils: Pupils are equal, round, and reactive to light.  Cardiovascular:     Rate and Rhythm: Normal rate and regular rhythm.  Pulmonary:     Effort: Pulmonary effort is normal.     Breath sounds: Normal breath sounds.  Abdominal:     Comments: Abdomen soft, nondistended, no percussion tenderness, mild tenderness to palpation in the right upper quadrant, negative  Murphy's, no rigidity, guarding, rebound tenderness  Musculoskeletal:        General: Normal range of motion.     Cervical back: Normal range of motion.  Skin:    General: Skin is warm and dry.  Neurological:     General: No focal deficit present.     Mental Status: She is alert and oriented to person, place, and time.  Psychiatric:        Mood and Affect: Mood normal.        Behavior: Behavior normal.      Results: Abdominal US (10/16/21): IMPRESSION: 1. Bilateral renal calculi.  No hydronephrosis. 2. Echogenic liver likely related to diffuse hepatocellular disease such as fatty infiltration. 3. Cholelithiasis. No additional sonographic evidence for acute cholecystitis.    Assessment & Plan:  Emaley Applin Corado is a 71 y.o. female who presents for evaluation of her abdominal pain and cholelithiasis.   -While the patient has multiple complaints, I did explain that her gallstones could be at least partially contributing to her abdominal pain.  Given that, we discussed the possibilities of laparoscopic cholecystectomy.  After discussions, she is agreeable to proceed with the procedure, as she would like at least a little bit of improvement even if her gallbladder is not the complete source of her abdominal complaints -I counseled the patient about the indication, risks and benefits of laparoscopic cholecystectomy.  She understands there is a very small chance for bleeding, infection, injury to normal structures (including common bile duct),  conversion to open surgery, persistent symptoms, evolution of postcholecystectomy diarrhea, need for secondary interventions, anesthesia reaction, cardiopulmonary issues and other risks not specifically detailed here. I described the expected recovery, the plan for follow-up and the restrictions during the recovery phase.  All questions were answered. -Patient tentatively scheduled for laparoscopic cholecystectomy on 11/22/2021   All questions were answered  to the satisfaction of the patient.   Graciella Freer, DO [facility] [address] [place], [place] [address] [phone] (office)

## 2021-11-09 NOTE — Assessment & Plan Note (Signed)
BP Readings from Last 1 Encounters:  11/06/21 125/64   Overall remains well-controlled with Amlodipine and Metoprolol Counseled for compliance with the medications Advised DASH diet and moderate exercise/walking, at least 150 mins/week

## 2021-11-09 NOTE — Assessment & Plan Note (Signed)
Followed by urology Refilled tramadol as needed for flank pain

## 2021-11-09 NOTE — Assessment & Plan Note (Signed)
Referred to general surgery as she has colicky pain

## 2021-11-09 NOTE — Assessment & Plan Note (Signed)
Lab Results  Component Value Date   HGBA1C 8.0 (A) 10/29/2021   Diet controlled Was on medications in the past Does not prefer to take medications for DM or statin, agrees to follow strict low-carb diet for now Advised to follow up with Ophthalmology for diabetic eye exam.

## 2021-11-09 NOTE — Assessment & Plan Note (Signed)
On NP thyroid Chart review suggests normal TSH Plan to down titrate NP thyroid and switch to Levothyroxine if abnormal TSH -followed by Endocrinology Check TSH and free T4

## 2021-11-09 NOTE — Assessment & Plan Note (Signed)
Better controlled with omeprazole now

## 2021-11-12 ENCOUNTER — Other Ambulatory Visit: Payer: Self-pay

## 2021-11-12 ENCOUNTER — Encounter (INDEPENDENT_AMBULATORY_CARE_PROVIDER_SITE_OTHER): Payer: Self-pay | Admitting: Gastroenterology

## 2021-11-12 ENCOUNTER — Ambulatory Visit (INDEPENDENT_AMBULATORY_CARE_PROVIDER_SITE_OTHER): Payer: Medicare Other | Admitting: Gastroenterology

## 2021-11-12 VITALS — BP 142/64 | HR 84 | Temp 98.4°F | Ht 66.0 in | Wt 151.6 lb

## 2021-11-12 DIAGNOSIS — Z800 Family history of malignant neoplasm of digestive organs: Secondary | ICD-10-CM

## 2021-11-12 DIAGNOSIS — K760 Fatty (change of) liver, not elsewhere classified: Secondary | ICD-10-CM

## 2021-11-12 NOTE — Progress Notes (Signed)
Referring Provider: Lindell Spar, MD Primary Care Physician:  Lindell Spar, MD Primary GI Physician: new  Chief Complaint  Patient presents with   Fatty liver    Pt arrives as a new patient for fatty liver. Pt states she is having gallbladder surgery on 2/23. Having abdominal pain, right shoulder, back pain. A little bit of nausea, indigestion. Taking omeprazole. Med helps.    HPI:   [name] is a 71 y.o. female with past medical history of HTN, GERD, Type II DM, Hypothyroidism, HLD and parathyroid adenoma.   Patient presenting today as new patient for fatty liver disease.  Recent RUQ [place] (10/16/21) with echogenic liver likely related to diffuse hepatocellular disease such as fatty infiltration. No masses appreciated. Last LFTs 10/22/21 with AST 19, ALT 21, Alk PHos 75, albumin 2.2. normal INR in oct 2022 along with normal Platelet count.   Notably, recent RUQ [place] also showed presence of cholelithiasis, patient scheduled for lap chole on 11/22/21 r/t cholelithiasis.   She does report that she has some nausea and indigestion. Currently on omeprazole 80m once daily. Feels that indigestion is mostly related to her gallbladder issues. She does have hx of IBS with occasional RLQ discomfort. She states that she has bowel irregularities. She has looser stools at times, previously 1 solid but soft stool per day. States that now BMs do not occur at any specific time.   Patient states that she has been having nausea, RUQ pain and worsening indigestion since October when she had a large, 262mrenal stone she had to have surgically removed. She continued to have pain so she went back to urology who advised her to go back her PCP in regards to gallstones as there were no further renal stones on the R side. She was then referred to surgery who felt it would be best for her to have gallbladder taken out.   Patient states that exercise is difficult at this time that due to ongoing fatigue and  muscle pain. Reports parathyroid adenoma was discovered previously when she was having these similar symptoms, however, recent thyroid [place] unremarkable.   NSAID use: occasional 1/2 BC powder Social hxHU:OHFGBMSXJDlass of wine, previous smoker Fam hx: mother had pancreatic cancer, sister diagnosed with colon cancer at [id number] Colonoscopy:11 years ago (ma40Cologuard 03/07/21: negative Last Endoscopy: maybe 20 years ago, had H pylori.  Recommendations:    Past Medical History:  Diagnosis Date   Arthritis    Asthma    takes Singulair nightly   Diabetes mellitus    takes levemir daily and Metformin daily   Generalized headaches    GERD (gastroesophageal reflux disease)    takes Omeprazole daily   Herniated disc, cervical    History of kidney stones    Hyperlipidemia    takes Welchol daily   Hypertension    takes Enalapril dialy   Hypothyroidism    takes Synthroid daily   IBS (irritable bowel syndrome)    Joint pain    hands and neck   Kidney stones    Nocturia    PONV (postoperative nausea and vomiting)    TIA (transient ischemic attack)    diagnosed by EEG but never knew she had it   Trigger finger    Weakness    Weakness    in both hands    Past Surgical History:  Procedure Laterality Date   ABDOMINAL HYSTERECTOMY  1980   ANTERIOR CERVICAL DECOMP/DISCECTOMY FUSION N/A  10/18/2013   Procedure: Cervical three-four, Cervical four-five, Cervical seven-Thoracic one anterior cervical decompression with fusion plating and bonegraft;  Surgeon: Hosie Spangle, MD;  Location: [place] [facility];  Service: Neurosurgery;  Laterality: N/A;  Cervical three-four, Cervical four-five, Cervical seven-Thoracic one anterior cervical decompression with fusion plating and bonegraft   APPENDECTOMY     done with hysterectomy   BACK SURGERY  2007   lumbar fusion   carpel tunnel Bilateral    left first then right   cataract implants     COLONOSCOPY     ESOPHAGOGASTRODUODENOSCOPY      hx of h pylori   EYE SURGERY  11/18/2008   right - cataracts   EYE SURGERY  06/01/2009   left - cataracts   herniated disc   10/2000, 08/31/2008   C5-C6 then C6-C7   IR URETERAL STENT RIGHT NEW ACCESS W/O SEP NEPHROSTOMY CATH  07/26/2021   knee anthroscopy bilateral Bilateral    KNEE ARTHROSCOPY     LITHOTRIPSY  11/12/2007   right side   NEPHROLITHOTOMY Right 07/26/2021   Procedure: NEPHROLITHOTOMY PERCUTANEOUS;  Surgeon: Franchot Gallo, MD;  Location: [facility];  Service: Urology;  Laterality: Right;   Parathyroid surgery     October 2013   PARATHYROIDECTOMY  07/02/2012   Procedure: PARATHYROIDECTOMY;  Surgeon: Earnstine Regal, MD;  Location: [facility];  Service: General;  Laterality: N/A;   TONSILLECTOMY     as a child    Current Outpatient Medications  Medication Sig Dispense Refill   ACCU-CHEK GUIDE test strip USE UP TO FOUR TIMES DAILY AS DIRECTED     amLODipine (NORVASC) 10 MG tablet Take 1 tablet (10 mg total) by mouth daily. 90 tablet 1   blood glucose meter kit and supplies KIT Dispense based on patient and insurance preference. Use twice daily (before breakfast and before bed). 1 each 0   estradiol (ESTRACE) 1 MG tablet Take 1 tablet (1 mg total) by mouth daily. 90 tablet 1   metoprolol tartrate (LOPRESSOR) 25 MG tablet Take 1 tablet (25 mg total) by mouth 2 (two) times daily. 180 tablet 3   omeprazole (PRILOSEC) 20 MG capsule Take 1 capsule (20 mg total) by mouth daily. 30 capsule 3   Potassium Citrate 15 MEQ (1620 MG) TBCR Take 1 tablet by mouth 2 (two) times daily.     thyroid (NP THYROID) 60 MG tablet Take 1 tablet (60 mg total) by mouth daily before breakfast. 90 tablet 1   traMADol (ULTRAM) 50 MG tablet Take 1 tablet (50 mg total) by mouth every 6 (six) hours as needed. 15 tablet 0   Vitamin D, Ergocalciferol, (DRISDOL) 1.25 MG (50000 UNIT) CAPS capsule Take 1 capsule (50,000 Units total) by mouth every 7 (seven) days. 12 capsule 0   No current facility-administered  medications for this visit.    Allergies as of 11/12/2021 - Review Complete 11/12/2021  Allergen Reaction Noted   Advair diskus [fluticasone-salmeterol] Anaphylaxis 05/18/2012   Codeine Nausea And Vomiting 05/18/2012   Hydrocodone-acetaminophen Nausea And Vomiting 07/02/2012   Oxycodone-acetaminophen Nausea And Vomiting 07/02/2012   Tetracyclines & related Itching 05/18/2012    Family History  Problem Relation Age of Onset   Cancer Mother        pancreatic    Social History   Socioeconomic History   Marital status: Widowed    Spouse name: Not on file   Number of children: Not on file   Years of education: Not on file   Highest education  level: Not on file  Occupational History   Not on file  Tobacco Use   Smoking status: Former    Packs/day: 1.00    Years: 40.00    Pack years: 40.00    Types: Cigarettes    Quit date: 07/19/2012    Years since quitting: 9.3   Smokeless tobacco: Never   Tobacco comments:    quit smoking in 29-Jul-2012  Vaping Use   Vaping Use: Never used  Substance and Sexual Activity   Alcohol use: No    Alcohol/week: 0.0 standard drinks   Drug use: No   Sexual activity: Not on file  Other Topics Concern   Not on file  Social History Narrative   Widow,husband died 2019/07/30 after 76 years marriage.Retired.   Social Determinants of Health   Financial Resource Strain: Not on file  Food Insecurity: Not on file  Transportation Needs: Not on file  Physical Activity: Not on file  Stress: Not on file  Social Connections: Not on file   Review of systems General: negative for night sweats, fever, chills, weight loss +fatigue Neck: Negative for lumps, goiter, pain and significant neck swelling Resp: Negative for cough, wheezing, dyspnea at rest CV: Negative for chest pain, leg swelling, palpitations, orthopnea GI: denies melena, hematochezia, vomiting, constipation, dysphagia, odyonophagia, early satiety or unintentional weight loss. +nausea +RUQ  pain +indigestion +looser stools MSK: Negative for swelling, back pain, +muscle pain Derm: Negative for itching or rash Psych: Denies depression, anxiety, memory loss, confusion. No homicidal or suicidal ideation.  Heme: Negative for prolonged bleeding, bruising easily, and swollen nodes. Endocrine: Negative for cold or heat intolerance, polyuria, polydipsia and goiter. Neuro: negative for tremor, gait imbalance, syncope and seizures. The remainder of the review of systems is noncontributory.  Physical Exam: BP (!) 142/64 (BP Location: Left Arm, Patient Position: Sitting, Cuff Size: Normal)    Pulse 84    Temp 98.4 F (36.9 C) (Oral)    Ht 5' 6"  (1.676 m)    Wt 151 lb 9.6 oz (68.8 kg)    BMI 24.47 kg/m  General:   Alert and oriented. No distress noted. Pleasant and cooperative.  Head:  Normocephalic and atraumatic. Eyes:  Conjuctiva clear without scleral icterus. Mouth:  Oral mucosa pink and moist. Good dentition. No lesions. Heart: Normal rate and rhythm, s1 and s2 heart sounds present.  Lungs: Clear lung sounds in all lobes. Respirations equal and unlabored. Abdomen:  +BS, soft, non-tender and non-distended. No rebound or guarding. No HSM or masses noted. Derm: No palmar erythema or jaundice Msk:  Symmetrical without gross deformities. Normal posture. Extremities:  Without edema. Neurologic:  Alert and  oriented x4 Psych:  Alert and cooperative. Normal mood and affect.  Invalid input(s): 6 MONTHS   ASSESSMENT: [name] is a 71 y.o. female presenting today as new patient for NAFLD.  [place] abd complete in January 2023 with echogenic liver, likely related to fatty infiltration. Reassuringly, most recent LFTs are WNL, platelet count and INR were also normal in July 29, 2021, indicating likely only fatty liver and no advancement to cirrhosis. We discussed importance of dietary modifications and weight management, though patient does tell me that she has been having more fatigue and muscle  pain recently,  making exercise difficult for her. I encouraged her to discuss these symptoms with PCP/endocrinologist to rule out reversible causes such as hypothyroidism or vitamin D deficiency, reassuringly her last thyroid US was unremarkable.   In regards to RUQ pain, nausea  and indigestion, patient feels that all of these began when she was diagnosed with gallstones. Maintained on omeprazole 70m daily with good result. She is supposed to have cholecystectomy at the end of this month and is hopeful this will resolve these symptoms. She will let me know if she continues to have GI symptoms after her surgery, we can plan to see her again for re evaluation after that, if needed.  Patient had negative cologuard last year, however, we discussed that Cologuard is intended for the qualitative detection of colorectal neoplasia associated DNA markers and for the presence of occult hemoglobin in human stool.  A negative Cologuard test result does not guarantee absence of cancer or pre cancerous polyp. False positives and false negatives do occur. Given her sister's recent diagnosis of colon cancer, it would be a good idea for her to have updated colonoscopy in light of this. Patient is agreeable and will call to get this setup once other acute symptoms related to her gallbladder are resolved. Reassuringly she is without any alarm symptoms.   PLAN:  -decrease overall weight by 5-7% -mediterranean diet -avoid greasy, fried, fatty foods and alcohol -discuss further evaluation of fatigue/muscle pain with PCP -continue omeprazole 239m-patient to let me know if GI symptoms persists after cholecystecotmy -patient to call for scheduling of colonoscopy  Follow Up: PRN  Brandi Artley L. CaAlver SorrowMSN, APRN, AGNP-C Adult-Gerontology Nurse Practitioner [facility]

## 2021-11-12 NOTE — Patient Instructions (Signed)
Labs and ultrasound are indicative of fatty liver disease. At this time there is no necessary treatment for this other than watching your diet and exercising with goal of 5% decrease in overall weight. I understand that exercise would be difficult at this time given your gallbladder issues and ongoing fatigue and muscle pain. Please reach out to PCP/endocrinology regarding results of your last vit d and thyroid testing, if these were normal, you may benefit from further evaluation with autoimmune testing to rule out this type issue as the cause of your symptoms.   Please let me know if your GI symptoms do not improve after your gallbladder removal.   I am providing the mediterranean diet as a guide to follow for healthier eating related to fatty liver. Please continue to avoid alcohol.    Follow up as needed

## 2021-11-14 ENCOUNTER — Telehealth (INDEPENDENT_AMBULATORY_CARE_PROVIDER_SITE_OTHER): Payer: Self-pay | Admitting: *Deleted

## 2021-11-14 NOTE — Telephone Encounter (Signed)
Pt left message on vm that she wanted to give chelsea an update and asked for a call back. I called back and had to leave a message for her to call me back.

## 2021-11-15 NOTE — Telephone Encounter (Signed)
Pt states endocrinologist ordered vit d for future. States she was told it was due April 25th.

## 2021-11-16 DIAGNOSIS — Z800 Family history of malignant neoplasm of digestive organs: Secondary | ICD-10-CM | POA: Insufficient documentation

## 2021-11-16 DIAGNOSIS — K760 Fatty (change of) liver, not elsewhere classified: Secondary | ICD-10-CM | POA: Insufficient documentation

## 2021-11-19 NOTE — Patient Instructions (Signed)
[name]  11/19/2021     @PREFPERIOPPHARMACY @   Your procedure is scheduled on  11/22/2021.   Report to [facility] at  0730 A.M.   Call this number if you have problems the morning of surgery:  [phone]   Remember:  Do not eat or drink anything after midnight.     DO NOT take any medications for diabetes the morning of your procedure.    Take these medicines the morning of surgery with A SIP OF WATER                         metoprolol, prilosec, NP thyroid, tramadol.     Do not wear jewelry, make-up or nail polish.  Do not wear lotions, powders, or perfumes, or deodorant.  Do not shave 48 hours prior to surgery.  Men may shave face and neck.  Do not bring valuables to the hospital.  [facility] is not responsible for any belongings or valuables.  Contacts, dentures or bridgework may not be worn into surgery.  Leave your suitcase in the car.  After surgery it may be brought to your room.  For patients admitted to the hospital, discharge time will be determined by your treatment team.  Patients discharged the day of surgery will not be allowed to drive home and must have someone with them for 24 hours.    Special instructions:   DO NOT smoke tobacco or vape for 24 hours before your procedure.  Please read over the following fact sheets that you were given. Coughing and Deep Breathing, Surgical Site Infection Prevention, Anesthesia Post-op Instructions, and Care and Recovery After Surgery      Minimally Invasive Cholecystectomy, Care After The following information offers guidance on how to care for yourself after your procedure. Your health care provider may also give you more specific instructions. If you have problems or questions, contact your health care provider. What can I expect after the procedure? After the procedure, it is common to have: Pain at your incision sites. You will be given medicines to control this pain. Mild nausea or  vomiting. Bloating and possible shoulder pain from the gas that was used during the procedure. Follow these instructions at home: Medicines Take over-the-counter and prescription medicines only as told by your health care provider. If you were prescribed an antibiotic medicine, take it as told by your health care provider. Do not stop using the antibiotic even if you start to feel better. Ask your health care provider if the medicine prescribed to you: Requires you to avoid driving or using machinery. Can cause constipation. You may need to take these actions to prevent or treat constipation: Drink enough fluid to keep your urine pale yellow. Take over-the-counter or prescription medicines. Eat foods that are high in fiber, such as beans, whole grains, and fresh fruits and vegetables. Limit foods that are high in fat and processed sugars, such as fried or sweet foods. Incision care  Follow instructions from your health care provider about how to take care of your incisions. Make sure you: Wash your hands with soap and water for at least 20 seconds before and after you change your bandage (dressing). If soap and water are not available, use hand sanitizer. Change your dressing as told by your health care provider. Leave stitches (sutures), skin glue, or adhesive strips in place. These skin closures may need to be in place for  2 weeks or longer. If adhesive strip edges start to loosen and curl up, you may trim the loose edges. Do not remove adhesive strips completely unless your health care provider tells you to do that. Do not take baths, swim, or use a hot tub until your health care provider approves. Ask your health care provider if you may take showers. You may only be allowed to take sponge baths. Check your incision area every day for signs of infection. Check for: More redness, swelling, or pain. Fluid or blood. Warmth. Pus or a bad smell. Activity Rest as told by your health care  provider. Do not do activities that require a lot of effort. Avoid sitting for a long time without moving. Get up to take short walks every 1-2 hours. This is important to improve blood flow and breathing. Ask for help if you feel weak or unsteady. Do not lift anything that is heavier than 10 lb (4.5 kg), or the limit that you are told, until your health care provider says that it is safe. Do not play contact sports until your health care provider approves. Do not return to work or school until your health care provider approves. Return to your normal activities as told by your health care provider. Ask your health care provider what activities are safe for you. General instructions If you were given a sedative during the procedure, it can affect you for several hours. Do not drive or operate machinery until your health care provider says that it is safe. Keep all follow-up visits. This is important. Contact a health care provider if: You develop a rash. You have more redness, swelling, or pain around your incisions. You have fluid or blood coming from your incisions. Your incisions feel warm to the touch. You have pus or a bad smell coming from your incisions. You have a fever. One or more of your incisions breaks open. Get help right away if: You have trouble breathing. You have chest pain. You have more pain in your shoulders. You faint or feel dizzy when you stand. You have severe pain in your abdomen. You have nausea or vomiting that lasts for more than one day. You have leg pain that is new or unusual, or if it is localized to one specific spot. These symptoms may represent a serious problem that is an emergency. Do not wait to see if the symptoms will go away. Get medical help right away. Call your local emergency services (911 in the [place] Do not drive yourself to the hospital. Summary After your procedure, it is common to have pain at the incision sites. You may also have nausea  or bloating. Follow your health care provider's instructions about medicine, activity restrictions, and caring for your incision areas. Do not do activities that require a lot of effort. Contact a health care provider if you have a fever or other signs of infection, such as more redness, swelling, or pain around the incisions. Get help right away if you have chest pain, increasing pain in the shoulders, or trouble breathing. This information is not intended to replace advice given to you by your health care provider. Make sure you discuss any questions you have with your health care provider. Document Revised: 03/20/2021 Document Reviewed: 03/20/2021 Elsevier Patient Education  [place] Anesthesia, Adult, Care After This sheet gives you information about how to care for yourself after your procedure. Your health care provider may also give you more specific instructions. If you  have problems or questions, contact your health care provider. What can I expect after the procedure? After the procedure, the following side effects are common: Pain or discomfort at the IV site. Nausea. Vomiting. Sore throat. Trouble concentrating. Feeling cold or chills. Feeling weak or tired. Sleepiness and fatigue. Soreness and body aches. These side effects can affect parts of the body that were not involved in surgery. Follow these instructions at home: For the time period you were told by your health care provider:  Rest. Do not participate in activities where you could fall or become injured. Do not drive or use machinery. Do not drink alcohol. Do not take sleeping pills or medicines that cause drowsiness. Do not make important decisions or sign legal documents. Do not take care of children on your own. Eating and drinking Follow any instructions from your health care provider about eating or drinking restrictions. When you feel hungry, start by eating small amounts of foods that are  soft and easy to digest (bland), such as toast. Gradually return to your regular diet. Drink enough fluid to keep your urine pale yellow. If you vomit, rehydrate by drinking water, juice, or clear broth. General instructions If you have sleep apnea, surgery and certain medicines can increase your risk for breathing problems. Follow instructions from your health care provider about wearing your sleep device: Anytime you are sleeping, including during daytime naps. While taking prescription pain medicines, sleeping medicines, or medicines that make you drowsy. Have a responsible adult stay with you for the time you are told. It is important to have someone help care for you until you are awake and alert. Return to your normal activities as told by your health care provider. Ask your health care provider what activities are safe for you. Take over-the-counter and prescription medicines only as told by your health care provider. If you smoke, do not smoke without supervision. Keep all follow-up visits as told by your health care provider. This is important. Contact a health care provider if: You have nausea or vomiting that does not get better with medicine. You cannot eat or drink without vomiting. You have pain that does not get better with medicine. You are unable to pass urine. You develop a skin rash. You have a fever. You have redness around your IV site that gets worse. Get help right away if: You have difficulty breathing. You have chest pain. You have blood in your urine or stool, or you vomit blood. Summary After the procedure, it is common to have a sore throat or nausea. It is also common to feel tired. Have a responsible adult stay with you for the time you are told. It is important to have someone help care for you until you are awake and alert. When you feel hungry, start by eating small amounts of foods that are soft and easy to digest (bland), such as toast. Gradually return  to your regular diet. Drink enough fluid to keep your urine pale yellow. Return to your normal activities as told by your health care provider. Ask your health care provider what activities are safe for you. This information is not intended to replace advice given to you by your health care provider. Make sure you discuss any questions you have with your health care provider. Document Revised: 06/01/2020 Document Reviewed: 12/30/2019 Elsevier Patient Education  2022 [place]. How to Use Chlorhexidine for Bathing Chlorhexidine gluconate (CHG) is a germ-killing (antiseptic) solution that is used to clean the skin. It  can get rid of the bacteria that normally live on the skin and can keep them away for about 24 hours. To clean your skin with CHG, you may be given: A CHG solution to use in the shower or as part of a sponge bath. A prepackaged cloth that contains CHG. Cleaning your skin with CHG may help lower the risk for infection: While you are staying in the intensive care unit of the hospital. If you have a vascular access, such as a central line, to provide short-term or long-term access to your veins. If you have a catheter to drain urine from your bladder. If you are on a ventilator. A ventilator is a machine that helps you breathe by moving air in and out of your lungs. After surgery. What are the risks? Risks of using CHG include: A skin reaction. Hearing loss, if CHG gets in your ears and you have a perforated eardrum. Eye injury, if CHG gets in your eyes and is not rinsed out. The CHG product catching fire. Make sure that you avoid smoking and flames after applying CHG to your skin. Do not use CHG: If you have a chlorhexidine allergy or have previously reacted to chlorhexidine. On babies younger than 91 months of age. How to use CHG solution Use CHG only as told by your health care provider, and follow the instructions on the label. Use the full amount of CHG as directed.  Usually, this is one bottle. During a shower Follow these steps when using CHG solution during a shower (unless your health care provider gives you different instructions): Start the shower. Use your normal soap and shampoo to wash your face and hair. Turn off the shower or move out of the shower stream. Pour the CHG onto a clean washcloth. Do not use any type of brush or rough-edged sponge. Starting at your neck, lather your body down to your toes. Make sure you follow these instructions: If you will be having surgery, pay special attention to the part of your body where you will be having surgery. Scrub this area for at least 1 minute. Do not use CHG on your head or face. If the solution gets into your ears or eyes, rinse them well with water. Avoid your genital area. Avoid any areas of skin that have broken skin, cuts, or scrapes. Scrub your back and under your arms. Make sure to wash skin folds. Let the lather sit on your skin for 1-2 minutes or as long as told by your health care provider. Thoroughly rinse your entire body in the shower. Make sure that all body creases and crevices are rinsed well. Dry off with a clean towel. Do not put any substances on your body afterward--such as powder, lotion, or perfume--unless you are told to do so by your health care provider. Only use lotions that are recommended by the manufacturer. Put on clean clothes or pajamas. If it is the night before your surgery, sleep in clean sheets.  During a sponge bath Follow these steps when using CHG solution during a sponge bath (unless your health care provider gives you different instructions): Use your normal soap and shampoo to wash your face and hair. Pour the CHG onto a clean washcloth. Starting at your neck, lather your body down to your toes. Make sure you follow these instructions: If you will be having surgery, pay special attention to the part of your body where you will be having surgery. Scrub this  area for at least  1 minute. Do not use CHG on your head or face. If the solution gets into your ears or eyes, rinse them well with water. Avoid your genital area. Avoid any areas of skin that have broken skin, cuts, or scrapes. Scrub your back and under your arms. Make sure to wash skin folds. Let the lather sit on your skin for 1-2 minutes or as long as told by your health care provider. Using a different clean, wet washcloth, thoroughly rinse your entire body. Make sure that all body creases and crevices are rinsed well. Dry off with a clean towel. Do not put any substances on your body afterward--such as powder, lotion, or perfume--unless you are told to do so by your health care provider. Only use lotions that are recommended by the manufacturer. Put on clean clothes or pajamas. If it is the night before your surgery, sleep in clean sheets. How to use CHG prepackaged cloths Only use CHG cloths as told by your health care provider, and follow the instructions on the label. Use the CHG cloth on clean, dry skin. Do not use the CHG cloth on your head or face unless your health care provider tells you to. When washing with the CHG cloth: Avoid your genital area. Avoid any areas of skin that have broken skin, cuts, or scrapes. Before surgery Follow these steps when using a CHG cloth to clean before surgery (unless your health care provider gives you different instructions): Using the CHG cloth, vigorously scrub the part of your body where you will be having surgery. Scrub using a back-and-forth motion for 3 minutes. The area on your body should be completely wet with CHG when you are done scrubbing. Do not rinse. Discard the cloth and let the area air-dry. Do not put any substances on the area afterward, such as powder, lotion, or perfume. Put on clean clothes or pajamas. If it is the night before your surgery, sleep in clean sheets.  For general bathing Follow these steps when using CHG  cloths for general bathing (unless your health care provider gives you different instructions). Use a separate CHG cloth for each area of your body. Make sure you wash between any folds of skin and between your fingers and toes. Wash your body in the following order, switching to a new cloth after each step: The front of your neck, shoulders, and chest. Both of your arms, under your arms, and your hands. Your stomach and groin area, avoiding the genitals. Your right leg and foot. Your left leg and foot. The back of your neck, your back, and your buttocks. Do not rinse. Discard the cloth and let the area air-dry. Do not put any substances on your body afterward--such as powder, lotion, or perfume--unless you are told to do so by your health care provider. Only use lotions that are recommended by the manufacturer. Put on clean clothes or pajamas. Contact a health care provider if: Your skin gets irritated after scrubbing. You have questions about using your solution or cloth. You swallow any chlorhexidine. Call your local poison control center (1-782-578-0638 in the [place] Get help right away if: Your eyes itch badly, or they become very red or swollen. Your skin itches badly and is red or swollen. Your hearing changes. You have trouble seeing. You have swelling or tingling in your mouth or throat. You have trouble breathing. These symptoms may represent a serious problem that is an emergency. Do not wait to see if the symptoms will go away.  Get medical help right away. Call your local emergency services (911 in the [place] Do not drive yourself to the hospital. Summary Chlorhexidine gluconate (CHG) is a germ-killing (antiseptic) solution that is used to clean the skin. Cleaning your skin with CHG may help to lower your risk for infection. You may be given CHG to use for bathing. It may be in a bottle or in a prepackaged cloth to use on your skin. Carefully follow your health care provider's  instructions and the instructions on the product label. Do not use CHG if you have a chlorhexidine allergy. Contact your health care provider if your skin gets irritated after scrubbing. This information is not intended to replace advice given to you by your health care provider. Make sure you discuss any questions you have with your health care provider. Document Revised: 11/27/2020 Document Reviewed: 11/27/2020 Elsevier Patient Education  2022 [employer].

## 2021-11-20 ENCOUNTER — Encounter (HOSPITAL_COMMUNITY): Payer: Self-pay

## 2021-11-20 ENCOUNTER — Encounter (HOSPITAL_COMMUNITY)
Admission: RE | Admit: 2021-11-20 | Discharge: 2021-11-20 | Disposition: A | Discharge status: Home / Self Care | Payer: Medicare Other | Source: Ambulatory Visit | Attending: Surgery | Admitting: Surgery

## 2021-11-20 VITALS — BP 153/57 | HR 79 | Temp 98.4°F | Resp 16 | Ht 66.0 in | Wt 151.6 lb

## 2021-11-20 DIAGNOSIS — Z01812 Encounter for preprocedural laboratory examination: Secondary | ICD-10-CM | POA: Insufficient documentation

## 2021-11-20 DIAGNOSIS — E1169 Type 2 diabetes mellitus with other specified complication: Secondary | ICD-10-CM | POA: Insufficient documentation

## 2021-11-20 LAB — BASIC METABOLIC PANEL
Anion gap: 7 (ref 5–15)
BUN: 18 mg/dL (ref 8–23)
CO2: 23 mmol/L (ref 22–32)
Calcium: 8.5 mg/dL — ABNORMAL LOW (ref 8.9–10.3)
Chloride: 104 mmol/L (ref 98–111)
Creatinine, Ser: 0.82 mg/dL (ref 0.44–1.00)
GFR, Estimated: >60 mL/min (ref >60)
Glucose, Bld: 246 mg/dL — ABNORMAL HIGH (ref 70–99)
Potassium: 4.2 mmol/L (ref 3.5–5.1)
Sodium: 134 mmol/L — ABNORMAL LOW (ref 135–145)

## 2021-11-20 LAB — HEMOGLOBIN A1C
Hgb A1c MFr Bld: 7.6 % — ABNORMAL HIGH (ref 4.8–5.6)
Mean Plasma Glucose: 171 mg/dL

## 2021-11-22 ENCOUNTER — Ambulatory Visit (HOSPITAL_COMMUNITY): Payer: Medicare Other | Admitting: Anesthesiology

## 2021-11-22 ENCOUNTER — Encounter (HOSPITAL_COMMUNITY)
Admission: RE | Disposition: A | Discharge status: Home / Self Care | Payer: Self-pay | Source: Home / Self Care | Attending: Surgery

## 2021-11-22 ENCOUNTER — Ambulatory Visit (HOSPITAL_COMMUNITY)
Admission: RE | Admit: 2021-11-22 | Discharge: 2021-11-22 | Disposition: A | Discharge status: Home / Self Care | Payer: Medicare Other | Attending: Surgery | Admitting: Surgery

## 2021-11-22 ENCOUNTER — Encounter (HOSPITAL_COMMUNITY): Payer: Self-pay | Admitting: Surgery

## 2021-11-22 ENCOUNTER — Ambulatory Visit (HOSPITAL_BASED_OUTPATIENT_CLINIC_OR_DEPARTMENT_OTHER): Payer: Medicare Other | Admitting: Anesthesiology

## 2021-11-22 ENCOUNTER — Other Ambulatory Visit: Payer: Self-pay

## 2021-11-22 DIAGNOSIS — J45909 Unspecified asthma, uncomplicated: Secondary | ICD-10-CM | POA: Insufficient documentation

## 2021-11-22 DIAGNOSIS — Z87891 Personal history of nicotine dependence: Secondary | ICD-10-CM | POA: Insufficient documentation

## 2021-11-22 DIAGNOSIS — E119 Type 2 diabetes mellitus without complications: Secondary | ICD-10-CM | POA: Diagnosis not present

## 2021-11-22 DIAGNOSIS — K8020 Calculus of gallbladder without cholecystitis without obstruction: Secondary | ICD-10-CM | POA: Diagnosis not present

## 2021-11-22 DIAGNOSIS — Z7984 Long term (current) use of oral hypoglycemic drugs: Secondary | ICD-10-CM | POA: Insufficient documentation

## 2021-11-22 DIAGNOSIS — M1990 Unspecified osteoarthritis, unspecified site: Secondary | ICD-10-CM | POA: Insufficient documentation

## 2021-11-22 DIAGNOSIS — Z9049 Acquired absence of other specified parts of digestive tract: Secondary | ICD-10-CM | POA: Diagnosis not present

## 2021-11-22 DIAGNOSIS — I10 Essential (primary) hypertension: Secondary | ICD-10-CM | POA: Insufficient documentation

## 2021-11-22 DIAGNOSIS — K219 Gastro-esophageal reflux disease without esophagitis: Secondary | ICD-10-CM | POA: Insufficient documentation

## 2021-11-22 DIAGNOSIS — K8010 Calculus of gallbladder with chronic cholecystitis without obstruction: Secondary | ICD-10-CM | POA: Insufficient documentation

## 2021-11-22 DIAGNOSIS — E039 Hypothyroidism, unspecified: Secondary | ICD-10-CM | POA: Insufficient documentation

## 2021-11-22 DIAGNOSIS — Z87442 Personal history of urinary calculi: Secondary | ICD-10-CM | POA: Insufficient documentation

## 2021-11-22 DIAGNOSIS — Z90710 Acquired absence of both cervix and uterus: Secondary | ICD-10-CM | POA: Diagnosis not present

## 2021-11-22 DIAGNOSIS — N200 Calculus of kidney: Secondary | ICD-10-CM

## 2021-11-22 HISTORY — PX: CHOLECYSTECTOMY: SHX55

## 2021-11-22 LAB — GLUCOSE, CAPILLARY: Glucose-Capillary: 192 mg/dL — ABNORMAL HIGH (ref 70–99)

## 2021-11-22 SURGERY — LAPAROSCOPIC CHOLECYSTECTOMY
Anesthesia: General | Site: Abdomen

## 2021-11-22 MED ORDER — ONDANSETRON HCL 4 MG/2ML IJ SOLN
INTRAMUSCULAR | Status: AC
  Filled 2021-11-22: qty 2

## 2021-11-22 MED ORDER — ORAL CARE MOUTH RINSE: 15.0000 mL | Freq: Once | OROMUCOSAL | Status: DC

## 2021-11-22 MED ORDER — DEXAMETHASONE SODIUM PHOSPHATE 4 MG/ML IJ SOLN
INTRAMUSCULAR | Status: DC | PRN
Start: 2021-11-22 — End: 2021-11-22
  Administered 2021-11-22: 4 mg via INTRAVENOUS

## 2021-11-22 MED ORDER — CHLORHEXIDINE GLUCONATE CLOTH 2 % EX PADS
6.0000 | MEDICATED_PAD | Freq: Once | CUTANEOUS | Status: DC
Start: 2021-11-22 — End: 2021-11-22

## 2021-11-22 MED ORDER — FENTANYL CITRATE (PF) 250 MCG/5ML IJ SOLN
INTRAMUSCULAR | Status: AC
  Filled 2021-11-22: qty 5

## 2021-11-22 MED ORDER — TRAMADOL HCL 50 MG PO TABS: 50.0000 mg | ORAL_TABLET | Freq: Four times a day (QID) | ORAL | 0 refills | Status: DC | PRN

## 2021-11-22 MED ORDER — MIDAZOLAM HCL 2 MG/2ML IJ SOLN
INTRAMUSCULAR | Status: AC
  Filled 2021-11-22: qty 2

## 2021-11-22 MED ORDER — SCOPOLAMINE 1 MG/3DAYS TD PT72
1.0000 | MEDICATED_PATCH | Freq: Once | TRANSDERMAL | Status: DC
  Administered 2021-11-22: 1.5 mg via TRANSDERMAL

## 2021-11-22 MED ORDER — SCOPOLAMINE 1 MG/3DAYS TD PT72
MEDICATED_PATCH | TRANSDERMAL | Status: AC
  Filled 2021-11-22: qty 1

## 2021-11-22 MED ORDER — FENTANYL CITRATE (PF) 250 MCG/5ML IJ SOLN
INTRAMUSCULAR | Status: DC | PRN
Start: 2021-11-22 — End: 2021-11-22
  Administered 2021-11-22 (×3): 50 ug via INTRAVENOUS

## 2021-11-22 MED ORDER — PROPOFOL 10 MG/ML IV BOLUS
INTRAVENOUS | Status: DC | PRN
  Administered 2021-11-22: 150 mg via INTRAVENOUS
  Administered 2021-11-22: 50 mg via INTRAVENOUS

## 2021-11-22 MED ORDER — PROPOFOL 10 MG/ML IV BOLUS
INTRAVENOUS | Status: AC
  Filled 2021-11-22: qty 20

## 2021-11-22 MED ORDER — LACTATED RINGERS IV BOLUS
1000.0000 mL | Freq: Once | INTRAVENOUS | Status: AC
  Administered 2021-11-22: 1000 mL via INTRAVENOUS

## 2021-11-22 MED ORDER — EPHEDRINE SULFATE-NACL 50-0.9 MG/10ML-% IV SOSY
PREFILLED_SYRINGE | INTRAVENOUS | Status: DC | PRN
Start: 2021-11-22 — End: 2021-11-22
  Administered 2021-11-22: 5 mg via INTRAVENOUS

## 2021-11-22 MED ORDER — METOCLOPRAMIDE HCL 5 MG/ML IJ SOLN
10.0000 mg | Freq: Once | INTRAMUSCULAR | Status: AC
  Administered 2021-11-22: 10 mg via INTRAVENOUS
  Filled 2021-11-22: qty 2

## 2021-11-22 MED ORDER — SUGAMMADEX SODIUM 200 MG/2ML IV SOLN
INTRAVENOUS | Status: DC | PRN
Start: 2021-11-22 — End: 2021-11-22
  Administered 2021-11-22: 200 mg via INTRAVENOUS

## 2021-11-22 MED ORDER — ONDANSETRON HCL 4 MG/2ML IJ SOLN
4.0000 mg | Freq: Once | INTRAMUSCULAR | Status: AC | PRN
  Administered 2021-11-22: 4 mg via INTRAVENOUS
  Filled 2021-11-22: qty 2

## 2021-11-22 MED ORDER — LIDOCAINE HCL (PF) 2 % IJ SOLN
INTRAMUSCULAR | Status: AC
  Filled 2021-11-22: qty 5

## 2021-11-22 MED ORDER — HYDROMORPHONE HCL 1 MG/ML IJ SOLN
0.2500 mg | INTRAMUSCULAR | Status: DC | PRN
  Administered 2021-11-22 (×3): 0.5 mg via INTRAVENOUS
  Filled 2021-11-22 (×3): qty 0.5

## 2021-11-22 MED ORDER — KETOROLAC TROMETHAMINE 30 MG/ML IJ SOLN
INTRAMUSCULAR | Status: AC
  Filled 2021-11-22: qty 1

## 2021-11-22 MED ORDER — ONDANSETRON HCL 4 MG/2ML IJ SOLN
INTRAMUSCULAR | Status: DC | PRN
  Administered 2021-11-22: 4 mg via INTRAVENOUS

## 2021-11-22 MED ORDER — SODIUM CHLORIDE 0.9 % IR SOLN
Status: DC | PRN
  Administered 2021-11-22: 1000 mL

## 2021-11-22 MED ORDER — ROCURONIUM BROMIDE 10 MG/ML (PF) SYRINGE
PREFILLED_SYRINGE | INTRAVENOUS | Status: DC | PRN
Start: 2021-11-22 — End: 2021-11-22
  Administered 2021-11-22: 50 mg via INTRAVENOUS

## 2021-11-22 MED ORDER — KETOROLAC TROMETHAMINE 30 MG/ML IJ SOLN
INTRAMUSCULAR | Status: DC | PRN
  Administered 2021-11-22: 30 mg via INTRAVENOUS

## 2021-11-22 MED ORDER — CHLORHEXIDINE GLUCONATE CLOTH 2 % EX PADS: 6.0000 | MEDICATED_PAD | Freq: Once | CUTANEOUS | Status: DC

## 2021-11-22 MED ORDER — LACTATED RINGERS IV SOLN: Freq: Once | INTRAVENOUS | Status: AC

## 2021-11-22 MED ORDER — FENTANYL CITRATE (PF) 100 MCG/2ML IJ SOLN
INTRAMUSCULAR | Status: AC
  Filled 2021-11-22: qty 2

## 2021-11-22 MED ORDER — MIDAZOLAM HCL 2 MG/2ML IJ SOLN
INTRAMUSCULAR | Status: DC | PRN
  Administered 2021-11-22: 2 mg via INTRAVENOUS

## 2021-11-22 MED ORDER — BUPIVACAINE HCL (PF) 0.5 % IJ SOLN
INTRAMUSCULAR | Status: DC | PRN
  Administered 2021-11-22: 15 mL

## 2021-11-22 MED ORDER — BUPIVACAINE HCL (PF) 0.5 % IJ SOLN
INTRAMUSCULAR | Status: AC
  Filled 2021-11-22: qty 30

## 2021-11-22 MED ORDER — LACTATED RINGERS IV SOLN
INTRAVENOUS | Status: DC
  Administered 2021-11-22: 1000 mL via INTRAVENOUS

## 2021-11-22 MED ORDER — CHLORHEXIDINE GLUCONATE 0.12 % MT SOLN: 15.0000 mL | Freq: Once | OROMUCOSAL | Status: DC

## 2021-11-22 MED ORDER — ACETAMINOPHEN 500 MG PO TABS: 500.0000 mg | ORAL_TABLET | Freq: Four times a day (QID) | ORAL | 0 refills | Status: AC

## 2021-11-22 MED ORDER — LIDOCAINE 2% (20 MG/ML) 5 ML SYRINGE
INTRAMUSCULAR | Status: DC | PRN
  Administered 2021-11-22: 100 mg via INTRAVENOUS

## 2021-11-22 MED ORDER — EPHEDRINE 5 MG/ML INJ
INTRAVENOUS | Status: AC
  Filled 2021-11-22: qty 5

## 2021-11-22 MED ORDER — ROCURONIUM BROMIDE 10 MG/ML (PF) SYRINGE
PREFILLED_SYRINGE | INTRAVENOUS | Status: AC
  Filled 2021-11-22: qty 10

## 2021-11-22 MED ORDER — DOCUSATE SODIUM 100 MG PO CAPS: 100.0000 mg | ORAL_CAPSULE | Freq: Two times a day (BID) | ORAL | 0 refills | Status: AC

## 2021-11-22 MED ORDER — SODIUM CHLORIDE 0.9 % IV SOLN
2.0000 g | INTRAVENOUS | Status: AC
  Administered 2021-11-22: 2 g via INTRAVENOUS
  Filled 2021-11-22: qty 2

## 2021-11-22 MED ORDER — HEMOSTATIC AGENTS (NO CHARGE) OPTIME
TOPICAL | Status: DC | PRN
  Administered 2021-11-22: 1

## 2021-11-22 SURGICAL SUPPLY — 42 items
ADH SKN CLS APL DERMABOND .7 (GAUZE/BANDAGES/DRESSINGS) ×1
APL PRP STRL LF DISP 70% ISPRP (MISCELLANEOUS) ×1
APPLIER CLIP ROT 10 11.4 M/L (STAPLE) ×2
APR CLP MED LRG 11.4X10 (STAPLE) ×1
BAG RETRIEVAL 10 (BASKET) ×1
BLADE SURG 15 STRL LF DISP TIS (BLADE) ×1 IMPLANT
BLADE SURG 15 STRL SS (BLADE) ×2
CHLORAPREP W/TINT 26 (MISCELLANEOUS) ×2 IMPLANT
CLIP APPLIE ROT 10 11.4 M/L (STAPLE) ×1 IMPLANT
CLOTH BEACON ORANGE TIMEOUT ST (SAFETY) ×2 IMPLANT
COVER LIGHT HANDLE STERIS (MISCELLANEOUS) ×4 IMPLANT
DERMABOND ADVANCED (GAUZE/BANDAGES/DRESSINGS) ×1
DERMABOND ADVANCED .7 DNX12 (GAUZE/BANDAGES/DRESSINGS) ×1 IMPLANT
ELECT REM PT RETURN 9FT ADLT (ELECTROSURGICAL) ×2
ELECTRODE REM PT RTRN 9FT ADLT (ELECTROSURGICAL) ×1 IMPLANT
GAUZE 4X4 16PLY ~~LOC~~+RFID DBL (SPONGE) ×2 IMPLANT
GLOVE SURG ENC MOIS LTX SZ6.5 (GLOVE) ×2 IMPLANT
GLOVE SURG LTX SZ6.5 (GLOVE) ×1 IMPLANT
GLOVE SURG POLYISO LF SZ7 (GLOVE) ×3 IMPLANT
GLOVE SURG UNDER POLY LF SZ6.5 (GLOVE) ×1 IMPLANT
GLOVE SURG UNDER POLY LF SZ7 (GLOVE) ×8 IMPLANT
GOWN STRL REUS W/TWL LRG LVL3 (GOWN DISPOSABLE) ×6 IMPLANT
HEMOSTAT SNOW SURGICEL 2X4 (HEMOSTASIS) ×2 IMPLANT
INST SET LAPROSCOPIC AP (KITS) ×2 IMPLANT
KIT TURNOVER KIT A (KITS) ×2 IMPLANT
MANIFOLD NEPTUNE II (INSTRUMENTS) ×2 IMPLANT
NDL INSUFFLATION 14GA 120MM (NEEDLE) ×1 IMPLANT
NEEDLE INSUFFLATION 14GA 120MM (NEEDLE) ×2 IMPLANT
NS IRRIG 1000ML POUR BTL (IV SOLUTION) ×2 IMPLANT
PACK LAP CHOLE LZT030E (CUSTOM PROCEDURE TRAY) ×2 IMPLANT
PAD ARMBOARD 7.5X6 YLW CONV (MISCELLANEOUS) ×2 IMPLANT
SET BASIN LINEN APH (SET/KITS/TRAYS/PACK) ×2 IMPLANT
SET TUBE SMOKE EVAC HIGH FLOW (TUBING) ×2 IMPLANT
SUT MNCRL AB 4-0 PS2 18 (SUTURE) ×4 IMPLANT
SUT VICRYL 0 UR6 27IN ABS (SUTURE) ×2 IMPLANT
SYS BAG RETRIEVAL 10MM (BASKET) ×1
SYSTEM BAG RETRIEVAL 10MM (BASKET) ×1 IMPLANT
TROCAR ENDO BLADELESS 11MM (ENDOMECHANICALS) ×2 IMPLANT
TROCAR Z-THRD FIOS HNDL 11X100 (TROCAR) ×1 IMPLANT
TROCAR Z-THREAD FIOS 5X100MM (TROCAR) ×2 IMPLANT
TUBE CONNECTING 12X1/4 (SUCTIONS) ×2 IMPLANT
WARMER LAPAROSCOPE (MISCELLANEOUS) ×2 IMPLANT

## 2021-11-22 NOTE — Anesthesia Preprocedure Evaluation (Signed)
Anesthesia Evaluation  Patient identified by MRN, date of birth, ID band Patient awake    Reviewed: Allergy & Precautions, NPO status , Patient's Chart, lab work & pertinent test results  History of Anesthesia Complications (+) PONV and history of anesthetic complications  Airway Mallampati: II  TM Distance: >3 FB Neck ROM: Full   Comment: Neck pain Dental  (+) Dental Advisory Given   Pulmonary asthma , former smoker,    Pulmonary exam normal breath sounds clear to auscultation       Cardiovascular hypertension, Pt. on medications Normal cardiovascular exam Rhythm:Regular Rate:Normal     Neuro/Psych  Headaches, TIAnegative psych ROS   GI/Hepatic Neg liver ROS, GERD  Medicated and Controlled,  Endo/Other  diabetes, Well Controlled, Type 2, Oral Hypoglycemic AgentsHypothyroidism   Renal/GU Renal disease  negative genitourinary   Musculoskeletal  (+) Arthritis , Osteoarthritis,    Abdominal   Peds negative pediatric ROS (+)  Hematology negative hematology ROS (+)   Anesthesia Other Findings   Reproductive/Obstetrics negative OB ROS                             Anesthesia Physical Anesthesia Plan  ASA: 2  Anesthesia Plan: General   Post-op Pain Management: Dilaudid IV   Induction: Intravenous  PONV Risk Score and Plan: 4 or greater and Ondansetron, Dexamethasone and Scopolamine patch - Pre-op  Airway Management Planned:   Additional Equipment:   Intra-op Plan:   Post-operative Plan: Extubation in OR  Informed Consent: I have reviewed the patients History and Physical, chart, labs and discussed the procedure including the risks, benefits and alternatives for the proposed anesthesia with the patient or authorized representative who has indicated his/her understanding and acceptance.     Dental advisory given  Plan Discussed with: CRNA and Surgeon  Anesthesia Plan Comments:          Anesthesia Quick Evaluation

## 2021-11-22 NOTE — Progress Notes (Signed)
Update Note:  Called the patient's friend, [name], to update him after the surgery.  It was explained that [name]' gallbladder with able to be removed without issue.  She will go to PACU, and when she is adequately recovered, she will will be able to go home.  All of her prescriptions were sent to the [facility] in [place].  All questions were answered to his expressed satisfaction.  Graciella Freer, DO [facility] [address] [place], [place] [address] [phone] (office)

## 2021-11-22 NOTE — Anesthesia Procedure Notes (Addendum)
Procedure Name: Intubation Date/Time: 11/22/2021 10:12 AM Performed by: Orlie Dakin, CRNA Pre-anesthesia Checklist: Emergency Drugs available, Patient identified, Suction available and Patient being monitored Patient Re-evaluated:Patient Re-evaluated prior to induction Oxygen Delivery Method: Circle system utilized Preoxygenation: Pre-oxygenation with 100% oxygen Induction Type: IV induction Ventilation: Mask ventilation without difficulty Laryngoscope Size: Glidescope and 3 Grade View: Grade I Tube type: Oral Tube size: 7.0 mm Number of attempts: 1 Airway Equipment and Method: Rigid stylet and Video-laryngoscopy Placement Confirmation: ETT inserted through vocal cords under direct vision, positive ETCO2 and breath sounds checked- equal and bilateral Secured at: 22 cm Tube secured with: Tape Dental Injury: Teeth and Oropharynx as per pre-operative assessment  Comments: Noted recessed chin, mandible, H/O neck fusion, upper front dental "bridge".  Elective Glidescope used as noted above.  4x4s bite block used.

## 2021-11-22 NOTE — Addendum Note (Signed)
Addendum  created 11/22/21 1419 by Denese Killings, MD   Clinical Note Signed

## 2021-11-22 NOTE — Op Note (Signed)
Operative Note   Preoperative Diagnosis: Symptomatic cholelithiasis   Postoperative Diagnosis: Same   Procedure(s) Performed: Laparoscopic cholecystectomy   Surgeon: Graciella Freer, DO    Assistants: Marquita Palms, RN   Anesthesia: General endotracheal   Anesthesiologist: Denese Killings, MD    Specimens: Gallbladder    Estimated Blood Loss: Minimal    Blood Replacement: None    Complications: None    Operative Findings: Minimally inflamed gallbladder  Indications: Patient is a 71 year old female who presents for laparoscopic cholecystectomy.  She has history of abdominal pain in the epigastrium and right upper quadrant, and she underwent abdominal ultrasound which demonstrated cholelithiasis.  She is agreeable to laparoscopic cholecystectomy at this time.  All risks, benefits, and alternatives to laparoscopic cholecystectomy were discussed with the patient, all of her questions were answered to her expressed satisfaction. The patient expresses she wishes to proceed, and informed consent was obtained.  Procedure: The patient was taken to the operating room and placed supine. General endotracheal anesthesia was induced. Intravenous antibiotics were administered per protocol. An orogastric tube positioned to decompress the stomach. The abdomen was prepared and draped in the usual sterile fashion.    A supraumbilical incision was made and a Veress technique was utilized to achieve pneumoperitoneum to 15 mmHg with carbon dioxide. A 11 mm optiview port was placed through the supraumbilical region, and a 10 mm 0-degree operative laparoscope was introduced. The area underlying the trocar and Veress needle were inspected and without evidence of injury.  Remaining trocars were placed under direct vision. Two 5 mm ports were placed in the right abdomen, between the anterior axillary and midclavicular line.  A final 11 mm port was placed through the mid-epigastrium, near the falciform  ligament.    The gallbladder fundus was elevated cephalad and the infundibulum was retracted to the patient's right. The gallbladder/cystic duct junction was skeletonized. The cystic artery noted in the triangle of Calot and was also skeletonized.  We then continued liberal medial and lateral dissection until the critical view of safety was achieved.    The cystic duct and cystic artery were doubly clipped and divided. The gallbladder was then dissected from the liver bed with electrocautery. The specimen was placed in an Endopouch and was retrieved through the epigastric site.   Final inspection revealed acceptable hemostasis. Surgical SNOW was placed in the gallbladder bed.  Trocars were removed and pneumoperitoneum was released.  0 Vicryl fascial sutures were used to close the epigastric and umbilical port sites. Skin incisions were closed with 4-0 Monocryl subcuticular sutures and Dermabond. The patient was awakened from anesthesia and extubated without complication.    Graciella Freer, DO  [facility] [address] [place], [place] [address] [phone] (office)

## 2021-11-22 NOTE — Transfer of Care (Signed)
Immediate Anesthesia Transfer of Care Note  Patient: [name]  Procedure(s) Performed: LAPAROSCOPIC CHOLECYSTECTOMY (Abdomen)  Patient Location: PACU  Anesthesia Type:General  Level of Consciousness: drowsy  Airway & Oxygen Therapy: Patient Spontanous Breathing and Patient connected to nasal cannula oxygen  Post-op Assessment: Report given to RN and Post -op Vital signs reviewed and stable  Post vital signs: Reviewed and stable  Last Vitals:  Vitals Value Taken Time  BP    Temp    Pulse 80 11/22/21 1114  Resp 17 11/22/21 1114  SpO2 97 % 11/22/21 1114  Vitals shown include unvalidated device data.  Last Pain:  Vitals:   11/22/21 0742  TempSrc: Oral  PainSc: 3       Patients Stated Pain Goal: 8 (24/11/46 4314)  Complications: No notable events documented.

## 2021-11-22 NOTE — Addendum Note (Signed)
Addendum  created 11/22/21 1312 by Denese Killings, MD   Order list changed, Pharmacy for encounter modified

## 2021-11-22 NOTE — Interval H&P Note (Signed)
History and Physical Interval Note:  11/22/2021 9:35 AM  [name]  has presented today for surgery, with the diagnosis of CHOLELITHIASIS.  The various methods of treatment have been discussed with the patient and family. After consideration of risks, benefits and other options for treatment, the patient has consented to  Procedure(s): LAPAROSCOPIC CHOLECYSTECTOMY (N/A) as a surgical intervention.  The patient's history has been reviewed, patient examined, no change in status, stable for surgery.  I have reviewed the patient's chart and labs.  Questions were answered to the patient's satisfaction.     [place]

## 2021-11-22 NOTE — Anesthesia Postprocedure Evaluation (Addendum)
Anesthesia Post Note  Patient: [name]  Procedure(s) Performed: LAPAROSCOPIC CHOLECYSTECTOMY (Abdomen)  Patient location during evaluation: PACU Anesthesia Type: General Level of consciousness: awake and alert and oriented Pain management: pain level controlled Vital Signs Assessment: post-procedure vital signs reviewed and stable Respiratory status: spontaneous breathing, nonlabored ventilation and respiratory function stable Cardiovascular status: blood pressure returned to baseline and stable Postop Assessment: no apparent nausea or vomiting Anesthetic complications: no Comments: Patient was nauseous in phase 2, LR 1 liter bolus and Reglan 10 mg iv was given, felt better before discharge.   No notable events documented.   Last Vitals:  Vitals:   11/22/21 1151 11/22/21 1200  BP:  (!) 127/54  Pulse: 86 86  Resp: 18 16  Temp:    SpO2: 99% 94%    Last Pain:  Vitals:   11/22/21 1200  TempSrc:   PainSc: 4                  Karon Heckendorn C [place] Guiles

## 2021-11-22 NOTE — Discharge Instructions (Signed)
Ambulatory Surgery Discharge Instructions  General Anesthesia or Sedation Do not drive or operate heavy machinery for 24 hours.  Do not consume alcohol, tranquilizers, sleeping medications, or any non-prescribed medications for 24 hours. Do not make important decisions or sign any important papers in the next 24 hours. You should have someone with you tonight at home.  Activity  You are advised to go directly home from the hospital.  Restrict your activities and rest for a day.  Resume light activity tomorrow. No heavy lifting over 10 lbs or strenuous exercise.  Fluids and Diet Begin with clear liquids, bouillon, dry toast, soda crackers.  If not nauseated, you may go to a regular diet when you desire.  Greasy and spicy foods are not advised.  Medications  If you have not had a bowel movement in 24 hours, take 2 tablespoons over the counter Milk of mag.             You May resume your blood thinners tomorrow (Aspirin, coumadin, or other).  You are being discharged with prescriptions for Opioid/Narcotic Medications: There are some specific considerations for these medications that you should know. Opioid Meds have risks & benefits. Addiction to these meds is always a concern with prolonged use Take medication only as directed Do not drive while taking narcotic pain medication Do not crush tablets or capsules Do not use a different container than medication was dispensed in Lock the container of medication in a cool, dry place out of reach of children and pets. Opioid medication can cause addiction Do not share with anyone else (this is a felony) Do not store medications for future use. Dispose of them properly.     Disposal:  Find a [facility] household drug take back site near you.  If you can't get to a drug take back site, use the recipe below as a last resort to dispose of expired, unused or unwanted drugs. Disposal  (Do not dispose chemotherapy drugs this way, talk to your  prescribing doctor instead.) Step 1: Mix drugs (do not crush) with dirt, kitty litter, or used coffee grounds and add a small amount of water to dissolve any solid medications. Step 2: Seal drugs in plastic bag. Step 3: Place plastic bag in trash. Step 4: Take prescription container and scratch out personal information, then recycle or throw away.  Operative Site  You have a liquid bandage over your incisions, this will begin to flake off in about a week. Ok to shower tomorrow. Keep wound clean and dry. No baths or swimming. No lifting more than 10 pounds.  Contact Information: If you have questions or concerns, please call our office, [phone], Monday- Thursday 8AM-5PM and Friday 8AM-12Noon.  If it is after hours or on the weekend, please call [facility] Main Number, [phone], and ask to speak to the surgeon on call for Dr. Bryssa Tones at [place].   SPECIFIC COMPLICATIONS TO WATCH FOR: Inability to urinate Fever over 101? F by mouth Nausea and vomiting lasting longer than 24 hours. Pain not relieved by medication ordered Swelling around the operative site Increased redness, warmth, hardness, around operative area Numbness, tingling, or cold fingers or toes Blood -soaked dressing, (small amounts of oozing may be normal) Increasing and progressive drainage from surgical area or exam site  

## 2021-11-23 LAB — SURGICAL PATHOLOGY

## 2021-11-26 LAB — GLUCOSE, CAPILLARY: Glucose-Capillary: 238 mg/dL — ABNORMAL HIGH (ref 70–99)

## 2021-11-27 ENCOUNTER — Telehealth: Payer: Self-pay | Admitting: Nurse Practitioner

## 2021-11-27 ENCOUNTER — Encounter (HOSPITAL_COMMUNITY): Payer: Self-pay | Admitting: Surgery

## 2021-12-02 ENCOUNTER — Other Ambulatory Visit: Payer: Self-pay | Admitting: Internal Medicine

## 2021-12-02 DIAGNOSIS — I10 Essential (primary) hypertension: Secondary | ICD-10-CM

## 2021-12-03 ENCOUNTER — Other Ambulatory Visit: Payer: Self-pay

## 2021-12-03 MED ORDER — ACCU-CHEK GUIDE VI STRP: ORAL_STRIP | 1 refills | Status: DC

## 2021-12-03 NOTE — Telephone Encounter (Signed)
How often do you want pt checking her blood glucose? ?

## 2021-12-03 NOTE — Telephone Encounter (Signed)
Pt states pharmacy told her insurance is kicking back because the test strips were sent in for 50 day supply instead of 90 and is asking for this to be corrected. ?

## 2021-12-03 NOTE — Telephone Encounter (Signed)
Called the patient and verified the correct testing times per day and I have sent in a new prescription to her pharmacy for the correct dosage and quantity. Patient verbalized an understanding. ?

## 2021-12-03 NOTE — Telephone Encounter (Signed)
Twice daily, before breakfast and before bed.

## 2021-12-05 ENCOUNTER — Telehealth: Payer: Self-pay | Admitting: *Deleted

## 2021-12-05 NOTE — Telephone Encounter (Signed)
Received call from patient [phone]~ telephone.  ? ?Surgical Date: 11/22/2021 ?Procedure: Lap Chole ? ?Patient reports that she has 4 incisions S/P Lap Chole. States that glue has been worn off all incisions and noted that incisions are healing well.  ? ?States that she has slight irritation from (1) incision where internal deep stitch is pushing out of skin. Advised to clip stitch as close to skin with cleaned tweezers and scissors.  ? ?Patient also inquired as to when she can take a soaking bath due to incisions. Advised to discuss with Dr. Okey Dupre on 12/07/2021 during telephone encounter.  ?

## 2021-12-07 ENCOUNTER — Other Ambulatory Visit: Payer: Self-pay

## 2021-12-07 ENCOUNTER — Ambulatory Visit (INDEPENDENT_AMBULATORY_CARE_PROVIDER_SITE_OTHER): Payer: Medicare Other | Admitting: Surgery

## 2021-12-07 DIAGNOSIS — Z09 Encounter for follow-up examination after completed treatment for conditions other than malignant neoplasm: Secondary | ICD-10-CM

## 2021-12-07 NOTE — Progress Notes (Signed)
[facility] ? ?I am calling the patient for post operative evaluation. This is not a billable encounter as it is under the [place] charges for the surgery.  The patient had a laparoscopic cholecystectomy on 2/23. The patient reports that she is doing well.  She still has shoulder pain post-operative, so she suspects that this pain was not related to her gallbladder.  Her other abdominal symptoms have improved since the surgery. She is tolerating a diet, having good pain control, and having regular Bms.  The incisions are healing well.  One of her lateral incisions was spitting a stitch, but she cut the stitch, and the irritation is improved.  I explained that she can put Neosporin on her incisions.  I also advised her to hold off on baths for a total of 4 weeks postoperatively. The patient has no concerns. All questions were answered to her expressed satisfaction. ? ?Pathology: ?A. GALLBLADDER, CHOLECYSTECTOMY:  ?- Chronic cholecystitis and cholelithiasis ? ?Will see the patient PRN.  ? ?Graciella Freer, DO ?[facility] ?[place], [place] [address] ?[phone] (office) ? ?

## 2021-12-14 DIAGNOSIS — M25511 Pain in right shoulder: Secondary | ICD-10-CM | POA: Diagnosis not present

## 2021-12-21 DIAGNOSIS — M25511 Pain in right shoulder: Secondary | ICD-10-CM | POA: Diagnosis not present

## 2021-12-28 DIAGNOSIS — M25511 Pain in right shoulder: Secondary | ICD-10-CM | POA: Diagnosis not present

## 2022-01-03 ENCOUNTER — Ambulatory Visit: Payer: Medicare Other | Admitting: Internal Medicine

## 2022-01-03 ENCOUNTER — Telehealth: Payer: Self-pay | Admitting: Nurse Practitioner

## 2022-01-03 NOTE — Telephone Encounter (Signed)
Called the patient and let her know that her Hemoglobin A1c was 7.6 and the approval documentation has been sent to [facility] with the approval of her procedure. Patient verbalized an understanding and thanked [place]. ?

## 2022-01-03 NOTE — Telephone Encounter (Signed)
Pt left a voicemail requesting your call back to discuss her A1C. [phone] ?

## 2022-01-03 NOTE — Telephone Encounter (Signed)
Pt called and said her surgeon office would be sending over a clearance form for her to have shoulder surgery. She would like Megan to call back ?

## 2022-01-07 DIAGNOSIS — E119 Type 2 diabetes mellitus without complications: Secondary | ICD-10-CM | POA: Diagnosis not present

## 2022-01-07 LAB — HM DIABETES EYE EXAM

## 2022-01-09 ENCOUNTER — Ambulatory Visit (INDEPENDENT_AMBULATORY_CARE_PROVIDER_SITE_OTHER): Payer: Medicare Other | Admitting: Internal Medicine

## 2022-01-09 ENCOUNTER — Encounter: Payer: Self-pay | Admitting: Internal Medicine

## 2022-01-09 VITALS — BP 118/58 | HR 71 | Resp 18 | Ht 66.0 in | Wt 149.0 lb

## 2022-01-09 DIAGNOSIS — M75101 Unspecified rotator cuff tear or rupture of right shoulder, not specified as traumatic: Secondary | ICD-10-CM | POA: Insufficient documentation

## 2022-01-09 DIAGNOSIS — I10 Essential (primary) hypertension: Secondary | ICD-10-CM

## 2022-01-09 DIAGNOSIS — E1169 Type 2 diabetes mellitus with other specified complication: Secondary | ICD-10-CM | POA: Diagnosis not present

## 2022-01-09 DIAGNOSIS — Z01818 Encounter for other preprocedural examination: Secondary | ICD-10-CM | POA: Diagnosis not present

## 2022-01-09 NOTE — Assessment & Plan Note (Signed)
Planned to get right shoulder scope/rotator cuff repair ?BP WNL ?EKG: Sinus rhythm.  Nonspecific T wave changes.  No signs of active ischemia. ?HbA1c: 7.6, diet controlled, better compared to prior ? ?Medically optimized with moderate risk for rotator cuff repair surgery ? ?

## 2022-01-09 NOTE — Progress Notes (Signed)
? ?Acute Office Visit ? ?Subjective:  ? ? Patient ID: [name], female    DOB: [birth year], 71 y.o.   MRN: [id number] ? ?Chief Complaint  ?Patient presents with  ? Follow-up  ?  Surgery clearance pt having surgery right shoulder needs forms filled out  ? ? ?HPI ?Patient is in today for preop evaluation for right shoulder surgery.  She has been having chronic right shoulder pain, and was found to have rotator cuff tear on right side.  She is going to get arthroscopic repair of rotator cuff. ? ?BP is well-controlled. Takes medications regularly. Patient denies headache, dizziness, chest pain, dyspnea or palpitations. ? ?She has been trying to follow low-carb diet.  Her HbA1c has improved to 7.6 with diet modification.  She checks her blood glucose at home, and ranges around 130-150 most of the time.  She denies any polyuria or polydipsia currently. ? ?Past Medical History:  ?Diagnosis Date  ? Arthritis   ? Asthma   ? takes Singulair nightly  ? Diabetes mellitus   ? takes levemir daily and Metformin daily  ? Generalized headaches   ? GERD (gastroesophageal reflux disease)   ? takes Omeprazole daily  ? Herniated disc, cervical   ? History of kidney stones   ? Hyperlipidemia   ? takes Welchol daily  ? Hypertension   ? takes Enalapril dialy  ? Hypothyroidism   ? takes Synthroid daily  ? IBS (irritable bowel syndrome)   ? Joint pain   ? hands and neck  ? Nocturia   ? PONV (postoperative nausea and vomiting)   ? TIA (transient ischemic attack)   ? diagnosed by EEG but never knew she had it  ? Trigger finger   ? Weakness   ? Weakness   ? in both hands  ? ? ?Past Surgical History:  ?Procedure Laterality Date  ? ABDOMINAL HYSTERECTOMY  1980  ? ANTERIOR CERVICAL DECOMP/DISCECTOMY FUSION N/A 10/18/2013  ? Procedure: Cervical three-four, Cervical four-five, Cervical seven-Thoracic one anterior cervical decompression with fusion plating and bonegraft;  Surgeon: Hosie Spangle, MD;  Location: [facility];  Service:  Neurosurgery;  Laterality: N/A;  Cervical three-four, Cervical four-five, Cervical seven-Thoracic one anterior cervical decompression with fusion plating and bonegraft  ? APPENDECTOMY    ? done with hysterectomy  ? BACK SURGERY  2007  ? lumbar fusion  ? carpel tunnel Bilateral   ? left first then right  ? cataract implants    ? CHOLECYSTECTOMY N/A 11/22/2021  ? Procedure: LAPAROSCOPIC CHOLECYSTECTOMY;  Surgeon: Rusty Aus, DO;  Location: [facility];  Service: General;  Laterality: N/A;  ? COLONOSCOPY    ? ESOPHAGOGASTRODUODENOSCOPY    ? hx of h pylori  ? EYE SURGERY  11/18/2008  ? right - cataracts  ? EYE SURGERY  06/01/2009  ? left - cataracts  ? herniated disc   10/2000, 08/31/2008  ? C5-C6 then C6-C7  ? IR URETERAL STENT RIGHT NEW ACCESS W/O SEP NEPHROSTOMY CATH  07/26/2021  ? knee anthroscopy bilateral Bilateral   ? KNEE ARTHROSCOPY    ? LITHOTRIPSY  11/12/2007  ? right side  ? NEPHROLITHOTOMY Right 07/26/2021  ? Procedure: NEPHROLITHOTOMY PERCUTANEOUS;  Surgeon: Franchot Gallo, MD;  Location: [facility];  Service: Urology;  Laterality: Right;  ? Parathyroid surgery    ? October 2013  ? PARATHYROIDECTOMY  07/02/2012  ? Procedure: PARATHYROIDECTOMY;  Surgeon: Earnstine Regal, MD;  Location: [facility];  Service: General;  Laterality: N/A;  ? TONSILLECTOMY    ?  as a child  ? ? ?Family History  ?Problem Relation Age of Onset  ? Cancer Mother   ?     pancreatic  ? ? ?Social History  ? ?Socioeconomic History  ? Marital status: Widowed  ?  Spouse name: Not on file  ? Number of children: Not on file  ? Years of education: Not on file  ? Highest education level: Not on file  ?Occupational History  ? Not on file  ?Tobacco Use  ? Smoking status: Former  ?  Packs/day: 1.00  ?  Years: 40.00  ?  Pack years: 40.00  ?  Types: Cigarettes  ?  Quit date: 07/19/2012  ?  Years since quitting: 9.4  ? Smokeless tobacco: Never  ? Tobacco comments:  ?  quit smoking in 08-02-2012  ?Vaping Use  ? Vaping Use: Never used  ?Substance and Sexual  Activity  ? Alcohol use: No  ?  Alcohol/week: 0.0 standard drinks  ? Drug use: No  ? Sexual activity: Not on file  ?Other Topics Concern  ? Not on file  ?Social History Narrative  ? Widow,husband died 08/03/2019 after 36 years marriage.Retired.  ? ?Social Determinants of Health  ? ?Financial Resource Strain: Not on file  ?Food Insecurity: Not on file  ?Transportation Needs: Not on file  ?Physical Activity: Not on file  ?Stress: Not on file  ?Social Connections: Not on file  ?Intimate Partner Violence: Not on file  ? ? ?Outpatient Medications Prior to Visit  ?Medication Sig Dispense Refill  ? amLODipine (NORVASC) 10 MG tablet TAKE ONE TABLET BY MOUTH ONCE DAILY. 90 tablet 0  ? blood glucose meter kit and supplies KIT Dispense based on patient and insurance preference. Use twice daily (before breakfast and before bed). 1 each 0  ? estradiol (ESTRACE) 1 MG tablet Take 1 tablet (1 mg total) by mouth daily. 90 tablet 1  ? glucose blood (ACCU-CHEK GUIDE) test strip Use as instructed to check blood glucose twice daily, once before breakfast and once before bedtime. 200 strip 1  ? metoprolol tartrate (LOPRESSOR) 25 MG tablet Take 1 tablet (25 mg total) by mouth 2 (two) times daily. 180 tablet 3  ? Potassium Citrate 15 MEQ (1620 MG) TBCR Take 1 tablet by mouth daily.    ? thyroid (NP THYROID) 60 MG tablet Take 1 tablet (60 mg total) by mouth daily before breakfast. 90 tablet 1  ? traMADol (ULTRAM) 50 MG tablet Take 1 tablet (50 mg total) by mouth every 6 (six) hours as needed for moderate pain or severe pain. 20 tablet 0  ? Vitamin D, Ergocalciferol, (DRISDOL) 1.25 MG (50000 UNIT) CAPS capsule Take 1 capsule (50,000 Units total) by mouth every 7 (seven) days. 12 capsule 0  ? omeprazole (PRILOSEC) 20 MG capsule Take 1 capsule (20 mg total) by mouth daily. 30 capsule 3  ? ?No facility-administered medications prior to visit.  ? ? ?Allergies  ?Allergen Reactions  ? Advair Diskus [Fluticasone-Salmeterol] Anaphylaxis  ? Codeine  Nausea And Vomiting  ? Hydrocodone Nausea And Vomiting  ? Levothyroxine Other (See Comments)  ?  Caused lower leg swelling  ? Oxycodone Nausea And Vomiting  ? Tetracyclines & Related Itching  ?  Internal  ? ? ?Review of Systems  ?Constitutional:  Positive for fatigue. Negative for chills and fever.  ?HENT:  Negative for congestion, sinus pressure, sinus pain and sore throat.   ?Eyes:  Negative for pain and discharge.  ?Respiratory:  Negative for cough  and shortness of breath.   ?Cardiovascular:  Negative for chest pain and palpitations.  ?Gastrointestinal:  Positive for abdominal pain. Negative for diarrhea, nausea and vomiting.  ?Endocrine: Negative for polydipsia and polyuria.  ?Genitourinary:  Negative for dysuria and hematuria.  ?Musculoskeletal:  Positive for arthralgias (Right shoulder) and myalgias. Negative for neck pain and neck stiffness.  ?Skin:  Negative for rash.  ?Neurological:  Negative for dizziness and weakness.  ?Psychiatric/Behavioral:  Negative for agitation and behavioral problems.   ? ?   ?Objective:  ?  ?Physical Exam ?Vitals reviewed.  ?Constitutional:   ?   General: She is not in acute distress. ?   Appearance: She is not diaphoretic.  ?HENT:  ?   Head: Normocephalic and atraumatic.  ?   Nose: Nose normal.  ?   Mouth/Throat:  ?   Mouth: Mucous membranes are moist.  ?Eyes:  ?   General: No scleral icterus. ?   Extraocular Movements: Extraocular movements intact.  ?Neck:  ?   Comments: Scar noted, site C/D/I ?Cardiovascular:  ?   Rate and Rhythm: Normal rate and regular rhythm.  ?   Pulses: Normal pulses.  ?   Heart sounds: Normal heart sounds. No murmur heard. ?Pulmonary:  ?   Breath sounds: Normal breath sounds. No wheezing or rales.  ?Musculoskeletal:  ?   Cervical back: Neck supple. No tenderness.  ?   Right lower leg: No edema.  ?   Left lower leg: No edema.  ?Skin: ?   General: Skin is warm.  ?   Findings: No rash.  ?Neurological:  ?   General: No focal deficit present.  ?   Mental  Status: She is alert and oriented to person, place, and time.  ?Psychiatric:     ?   Mood and Affect: Mood normal.     ?   Behavior: Behavior normal.  ? ? ?BP (!) 118/58 (BP Location: Left Arm, Patient Position: Si

## 2022-01-09 NOTE — Assessment & Plan Note (Signed)
BP Readings from Last 1 Encounters:  ?01/09/22 (!) 118/58  ? ?Overall remains well-controlled with Amlodipine and Metoprolol ?Counseled for compliance with the medications ?Advised DASH diet and moderate exercise/walking, at least 150 mins/week ?

## 2022-01-09 NOTE — Patient Instructions (Signed)
Please continue to take medications as prescribed.  Please continue to follow low carb diet and ambulate as tolerated. 

## 2022-01-09 NOTE — Assessment & Plan Note (Signed)
Lab Results  ?Component Value Date  ? HGBA1C 7.6 (H) 11/20/2021  ? ?Diet controlled ?Was on medications in the past ?Does not prefer to take medications for DM or statin, agrees to follow strict low-carb diet for now ?Advised to follow up with Ophthalmology for diabetic eye exam. ?

## 2022-01-14 ENCOUNTER — Telehealth: Payer: Self-pay

## 2022-01-15 ENCOUNTER — Encounter: Payer: Self-pay | Admitting: Internal Medicine

## 2022-01-16 NOTE — Telephone Encounter (Signed)
Practice received, surgery is scheduled. ?

## 2022-01-23 DIAGNOSIS — E559 Vitamin D deficiency, unspecified: Secondary | ICD-10-CM | POA: Diagnosis not present

## 2022-01-23 DIAGNOSIS — E039 Hypothyroidism, unspecified: Secondary | ICD-10-CM | POA: Diagnosis not present

## 2022-01-23 DIAGNOSIS — E119 Type 2 diabetes mellitus without complications: Secondary | ICD-10-CM | POA: Diagnosis not present

## 2022-01-24 LAB — LIPID PANEL
Chol/HDL Ratio: 5.1 ratio — ABNORMAL HIGH (ref 0.0–4.4)
Cholesterol, Total: 163 mg/dL (ref 100–199)
HDL: 32 mg/dL — ABNORMAL LOW (ref >39)
LDL Chol Calc (NIH): 87 mg/dL (ref 0–99)
Triglycerides: 264 mg/dL — ABNORMAL HIGH (ref 0–149)
VLDL Cholesterol Cal: 44 mg/dL — ABNORMAL HIGH (ref 5–40)

## 2022-01-24 LAB — COMPREHENSIVE METABOLIC PANEL
ALT: 29 IU/L (ref 0–32)
AST: 27 IU/L (ref 0–40)
Albumin/Globulin Ratio: 2.3 — ABNORMAL HIGH (ref 1.2–2.2)
Albumin: 4.3 g/dL (ref 3.7–4.7)
Alkaline Phosphatase: 74 IU/L (ref 44–121)
BUN/Creatinine Ratio: 15 (ref 12–28)
BUN: 14 mg/dL (ref 8–27)
Bilirubin Total: 0.4 mg/dL (ref 0.0–1.2)
CO2: 18 mmol/L — ABNORMAL LOW (ref 20–29)
Calcium: 9.4 mg/dL (ref 8.7–10.3)
Chloride: 101 mmol/L (ref 96–106)
Creatinine, Ser: 0.91 mg/dL (ref 0.57–1.00)
Globulin, Total: 1.9 g/dL (ref 1.5–4.5)
Glucose: 205 mg/dL — ABNORMAL HIGH (ref 70–99)
Potassium: 5.3 mmol/L — ABNORMAL HIGH (ref 3.5–5.2)
Sodium: 139 mmol/L (ref 134–144)
Total Protein: 6.2 g/dL (ref 6.0–8.5)
eGFR: 67 mL/min/{1.73_m2} (ref >59)

## 2022-01-24 LAB — VITAMIN D 25 HYDROXY (VIT D DEFICIENCY, FRACTURES): Vit D, 25-Hydroxy: 39.9 ng/mL (ref 30.0–100.0)

## 2022-01-24 LAB — T4, FREE: Free T4: 1.26 ng/dL (ref 0.82–1.77)

## 2022-01-24 LAB — TSH: TSH: 2.3 u[IU]/mL (ref 0.450–4.500)

## 2022-01-30 ENCOUNTER — Encounter: Payer: Self-pay | Admitting: Nurse Practitioner

## 2022-01-30 ENCOUNTER — Ambulatory Visit (INDEPENDENT_AMBULATORY_CARE_PROVIDER_SITE_OTHER): Payer: Medicare Other | Admitting: Nurse Practitioner

## 2022-01-30 VITALS — BP 132/56 | HR 75 | Ht 66.0 in | Wt 149.0 lb

## 2022-01-30 DIAGNOSIS — E119 Type 2 diabetes mellitus without complications: Secondary | ICD-10-CM

## 2022-01-30 DIAGNOSIS — E039 Hypothyroidism, unspecified: Secondary | ICD-10-CM

## 2022-01-30 DIAGNOSIS — E559 Vitamin D deficiency, unspecified: Secondary | ICD-10-CM | POA: Diagnosis not present

## 2022-01-30 DIAGNOSIS — D351 Benign neoplasm of parathyroid gland: Secondary | ICD-10-CM

## 2022-01-30 LAB — POCT GLYCOSYLATED HEMOGLOBIN (HGB A1C): HbA1c POC (<> result, manual entry): 7.1 % (ref 4.0–5.6)

## 2022-01-30 NOTE — Patient Instructions (Signed)
Diabetes Mellitus and Foot Care Foot care is an important part of your health, especially when you have diabetes. Diabetes may cause you to have problems because of poor blood flow (circulation) to your feet and legs, which can cause your skin to: Become thinner and drier. Break more easily. Heal more slowly. Peel and crack. You may also have nerve damage (neuropathy) in your legs and feet, causing decreased feeling in them. This means that you may not notice minor injuries to your feet that could lead to more serious problems. Noticing and addressing any potential problems early is the best way to prevent future foot problems. How to care for your feet Foot hygiene  Wash your feet daily with warm water and mild soap. Do not use hot water. Then, pat your feet and the areas between your toes until they are completely dry. Do not soak your feet as this can dry your skin. Trim your toenails straight across. Do not dig under them or around the cuticle. File the edges of your nails with an emery board or nail file. Apply a moisturizing lotion or petroleum jelly to the skin on your feet and to dry, brittle toenails. Use lotion that does not contain alcohol and is unscented. Do not apply lotion between your toes. Shoes and socks Wear clean socks or stockings every day. Make sure they are not too tight. Do not wear knee-high stockings since they may decrease blood flow to your legs. Wear shoes that fit properly and have enough cushioning. Always look in your shoes before you put them on to be sure there are no objects inside. To break in new shoes, wear them for just a few hours a day. This prevents injuries on your feet. Wounds, scrapes, corns, and calluses  Check your feet daily for blisters, cuts, bruises, sores, and redness. If you cannot see the bottom of your feet, use a mirror or ask someone for help. Do not cut corns or calluses or try to remove them with medicine. If you find a minor scrape,  cut, or break in the skin on your feet, keep it and the skin around it clean and dry. You may clean these areas with mild soap and water. Do not clean the area with peroxide, alcohol, or iodine. If you have a wound, scrape, corn, or callus on your foot, look at it several times a day to make sure it is healing and not infected. Check for: Redness, swelling, or pain. Fluid or blood. Warmth. Pus or a bad smell. General tips Do not cross your legs. This may decrease blood flow to your feet. Do not use heating pads or hot water bottles on your feet. They may burn your skin. If you have lost feeling in your feet or legs, you may not know this is happening until it is too late. Protect your feet from hot and cold by wearing shoes, such as at the beach or on hot pavement. Schedule a complete foot exam at least once a year (annually) or more often if you have foot problems. Report any cuts, sores, or bruises to your health care provider immediately. Where to find more information American Diabetes Association: www.diabetes.org Association of Diabetes Care & Education Specialists: www.diabeteseducator.org Contact a health care provider if: You have a medical condition that increases your risk of infection and you have any cuts, sores, or bruises on your feet. You have an injury that is not healing. You have redness on your legs or feet. You   feel burning or tingling in your legs or feet. You have pain or cramps in your legs and feet. Your legs or feet are numb. Your feet always feel cold. You have pain around any toenails. Get help right away if: You have a wound, scrape, corn, or callus on your foot and: You have pain, swelling, or redness that gets worse. You have fluid or blood coming from the wound, scrape, corn, or callus. Your wound, scrape, corn, or callus feels warm to the touch. You have pus or a bad smell coming from the wound, scrape, corn, or callus. You have a fever. You have a red  line going up your leg. Summary Check your feet every day for blisters, cuts, bruises, sores, and redness. Apply a moisturizing lotion or petroleum jelly to the skin on your feet and to dry, brittle toenails. Wear shoes that fit properly and have enough cushioning. If you have foot problems, report any cuts, sores, or bruises to your health care provider immediately. Schedule a complete foot exam at least once a year (annually) or more often if you have foot problems. This information is not intended to replace advice given to you by your health care provider. Make sure you discuss any questions you have with your health care provider. Document Revised: 04/06/2020 Document Reviewed: 04/06/2020 Elsevier Patient Education  2023 Elsevier Inc.  

## 2022-01-30 NOTE — Progress Notes (Signed)
?                                   ?                                Endocrinology Follow Up Note  ?                                       01/30/2022, 11:52 AM ? ?Subjective:  ? ?Subjective   ? ?[name] is a 71 y.o.-year-old female patient being seen in follow up after being seen in consultation for hypothyroidism, diabetes, hx parathyroid adenoma referred by Lindell Spar, MD. ? ? ?Past Medical History:  ?Diagnosis Date  ? Arthritis   ? Asthma   ? takes Singulair nightly  ? Diabetes mellitus   ? takes levemir daily and Metformin daily  ? Generalized headaches   ? GERD (gastroesophageal reflux disease)   ? takes Omeprazole daily  ? Herniated disc, cervical   ? History of kidney stones   ? Hyperlipidemia   ? takes Welchol daily  ? Hypertension   ? takes Enalapril dialy  ? Hypothyroidism   ? takes Synthroid daily  ? IBS (irritable bowel syndrome)   ? Joint pain   ? hands and neck  ? Nocturia   ? PONV (postoperative nausea and vomiting)   ? TIA (transient ischemic attack)   ? diagnosed by EEG but never knew she had it  ? Trigger finger   ? Weakness   ? Weakness   ? in both hands  ? ? ?Past Surgical History:  ?Procedure Laterality Date  ? ABDOMINAL HYSTERECTOMY  1980  ? ANTERIOR CERVICAL DECOMP/DISCECTOMY FUSION N/A 10/18/2013  ? Procedure: Cervical three-four, Cervical four-five, Cervical seven-Thoracic one anterior cervical decompression with fusion plating and bonegraft;  Surgeon: Hosie Spangle, MD;  Location: [facility];  Service: Neurosurgery;  Laterality: N/A;  Cervical three-four, Cervical four-five, Cervical seven-Thoracic one anterior cervical decompression with fusion plating and bonegraft  ? APPENDECTOMY    ? done with hysterectomy  ? BACK SURGERY  2007  ? lumbar fusion  ? carpel tunnel Bilateral   ? left first then right  ? cataract implants    ? CHOLECYSTECTOMY N/A 11/22/2021  ? Procedure: LAPAROSCOPIC CHOLECYSTECTOMY;  Surgeon: Rusty Aus, DO;  Location: [facility];  Service: General;   Laterality: N/A;  ? COLONOSCOPY    ? ESOPHAGOGASTRODUODENOSCOPY    ? hx of h pylori  ? EYE SURGERY  11/18/2008  ? right - cataracts  ? EYE SURGERY  06/01/2009  ? left - cataracts  ? herniated disc   10/2000, 08/31/2008  ? C5-C6 then C6-C7  ? IR URETERAL STENT RIGHT NEW ACCESS W/O SEP NEPHROSTOMY CATH  07/26/2021  ? knee anthroscopy bilateral Bilateral   ? KNEE ARTHROSCOPY    ? LITHOTRIPSY  11/12/2007  ? right side  ? NEPHROLITHOTOMY Right 07/26/2021  ? Procedure: NEPHROLITHOTOMY PERCUTANEOUS;  Surgeon: Franchot Gallo, MD;  Location: [facility];  Service: Urology;  Laterality: Right;  ? Parathyroid surgery    ? October 2013  ? PARATHYROIDECTOMY  07/02/2012  ? Procedure: PARATHYROIDECTOMY;  Surgeon: Earnstine Regal, MD;  Location: [facility];  Service: General;  Laterality: N/A;  ? TONSILLECTOMY    ?  as a child  ? ? ?Social History  ? ?Socioeconomic History  ? Marital status: Widowed  ?  Spouse name: Not on file  ? Number of children: Not on file  ? Years of education: Not on file  ? Highest education level: Not on file  ?Occupational History  ? Not on file  ?Tobacco Use  ? Smoking status: Former  ?  Packs/day: 1.00  ?  Years: 40.00  ?  Pack years: 40.00  ?  Types: Cigarettes  ?  Quit date: 07/19/2012  ?  Years since quitting: 9.5  ? Smokeless tobacco: Never  ? Tobacco comments:  ?  quit smoking in 2012-07-27  ?Vaping Use  ? Vaping Use: Never used  ?Substance and Sexual Activity  ? Alcohol use: No  ?  Alcohol/week: 0.0 standard drinks  ? Drug use: No  ? Sexual activity: Not on file  ?Other Topics Concern  ? Not on file  ?Social History Narrative  ? Widow,husband died 07/28/2019 after 24 years marriage.Retired.  ? ?Social Determinants of Health  ? ?Financial Resource Strain: Not on file  ?Food Insecurity: Not on file  ?Transportation Needs: Not on file  ?Physical Activity: Not on file  ?Stress: Not on file  ?Social Connections: Not on file  ? ? ?Family History  ?Problem Relation Age of Onset  ? Cancer Mother   ?     pancreatic   ? ? ?Outpatient Encounter Medications as of 01/30/2022  ?Medication Sig  ? amLODipine (NORVASC) 10 MG tablet TAKE ONE TABLET BY MOUTH ONCE DAILY.  ? blood glucose meter kit and supplies KIT Dispense based on patient and insurance preference. Use twice daily (before breakfast and before bed).  ? estradiol (ESTRACE) 1 MG tablet Take 1 tablet (1 mg total) by mouth daily.  ? glucose blood (ACCU-CHEK GUIDE) test strip Use as instructed to check blood glucose twice daily, once before breakfast and once before bedtime.  ? metoprolol tartrate (LOPRESSOR) 25 MG tablet Take 1 tablet (25 mg total) by mouth 2 (two) times daily.  ? thyroid (NP THYROID) 60 MG tablet Take 1 tablet (60 mg total) by mouth daily before breakfast.  ? traMADol (ULTRAM) 50 MG tablet Take 1 tablet (50 mg total) by mouth every 6 (six) hours as needed for moderate pain or severe pain.  ? Vitamin D, Ergocalciferol, (DRISDOL) 1.25 MG (50000 UNIT) CAPS capsule Take 1 capsule (50,000 Units total) by mouth every 7 (seven) days.  ? Potassium Citrate 15 MEQ (1620 MG) TBCR Take 1 tablet by mouth daily. (Patient not taking: Reported on 01/30/2022)  ? ?No facility-administered encounter medications on file as of 01/30/2022.  ? ? ?ALLERGIES: ?Allergies  ?Allergen Reactions  ? Advair Diskus [Fluticasone-Salmeterol] Anaphylaxis  ? Codeine Nausea And Vomiting  ? Hydrocodone Nausea And Vomiting  ? Levothyroxine Other (See Comments)  ?  Caused lower leg swelling  ? Oxycodone Nausea And Vomiting  ? Tetracyclines & Related Itching  ?  Internal  ? ?VACCINATION STATUS: ?Immunization History  ?Administered Date(s) Administered  ? Fluad Quad(high Dose 65+) 08/17/2019, 07/29/2021  ? Influenza Split 07/31/2013  ? Influenza, High Dose Seasonal PF 07/01/2017, 07/14/2020  ? Influenza-Unspecified 07/09/2017  ? Moderna Sars-Covid-2 Vaccination 11/24/2019, 12/22/2019, 08/30/2020, 02/28/2021  ? Tdap 07/12/2014  ? ? ? ?Diabetes ?She presents for her follow-up diabetic visit. She has type 2  diabetes mellitus. Her disease course has been improving. There are no hypoglycemic associated symptoms. Pertinent negatives for hypoglycemia include no nervousness/anxiousness. Associated symptoms  include fatigue. Pertinent negatives for diabetes include no weight loss. There are no hypoglycemic complications. Symptoms are stable. There are no diabetic complications. Risk factors for coronary artery disease include diabetes mellitus, stress, post-menopausal and hypertension. Current diabetic treatment includes diet. She is compliant with treatment most of the time. Her weight is fluctuating minimally. She is following a generally healthy diet. Meal planning includes avoidance of concentrated sweets. She has not had a previous visit with a dietitian. She participates in exercise intermittently. Her breakfast blood glucose range is generally 140-180 mg/dl. Her bedtime blood glucose range is generally 140-180 mg/dl. (She presents today with her logs, no meter, showing slightly above target fasting and at goal postprandial readings.  Her POCT A1c today is 7.1%, improving from last visit of 7.6%.  She will be having surgery for rotator cuff soon.  Her abdominal pain is much better after gallstone removal.  She denies any hypoglycemia.) An ACE inhibitor/angiotensin II receptor blocker is not being taken. She does not see a podiatrist.Eye exam is current.  ?Thyroid Problem ?Presents for follow-up visit. Symptoms include depressed mood and fatigue. Patient reports no anxiety, constipation, weight gain or weight loss. The symptoms have been stable.  ? ?Lylah Lantis Mccollom is a patient with the above medical history. she was diagnosed with parathyroid adenoma in 2013 which required parathyroidectomy done by Dr. Armandina Gemma.  She is now experiencing similar symptoms that she had at that time and would like to investigate whether or not there could be a problem with one of her other parathyroid glands.  She thinks she remembers  being started on thyroid hormone replacement after her parathyroid removal due to possible injury to the thyroid gland during its removal. ? ?She was a patient of Dr. Anastasio Champion who had started the patient on NP

## 2022-02-06 ENCOUNTER — Ambulatory Visit: Payer: Medicare Other | Admitting: Internal Medicine

## 2022-02-08 ENCOUNTER — Other Ambulatory Visit: Payer: Self-pay | Admitting: Nurse Practitioner

## 2022-02-20 DIAGNOSIS — S46011A Strain of muscle(s) and tendon(s) of the rotator cuff of right shoulder, initial encounter: Secondary | ICD-10-CM | POA: Diagnosis not present

## 2022-02-20 DIAGNOSIS — M19011 Primary osteoarthritis, right shoulder: Secondary | ICD-10-CM | POA: Diagnosis not present

## 2022-02-20 DIAGNOSIS — M24111 Other articular cartilage disorders, right shoulder: Secondary | ICD-10-CM | POA: Diagnosis not present

## 2022-02-20 DIAGNOSIS — G8918 Other acute postprocedural pain: Secondary | ICD-10-CM | POA: Diagnosis not present

## 2022-02-20 DIAGNOSIS — M7541 Impingement syndrome of right shoulder: Secondary | ICD-10-CM | POA: Diagnosis not present

## 2022-02-20 DIAGNOSIS — M7551 Bursitis of right shoulder: Secondary | ICD-10-CM | POA: Diagnosis not present

## 2022-02-25 ENCOUNTER — Other Ambulatory Visit: Payer: Self-pay | Admitting: Nurse Practitioner

## 2022-02-26 ENCOUNTER — Telehealth: Payer: Self-pay | Admitting: Internal Medicine

## 2022-02-26 ENCOUNTER — Other Ambulatory Visit: Payer: Self-pay | Admitting: *Deleted

## 2022-02-26 DIAGNOSIS — I10 Essential (primary) hypertension: Secondary | ICD-10-CM

## 2022-02-26 DIAGNOSIS — N951 Menopausal and female climacteric states: Secondary | ICD-10-CM

## 2022-02-26 MED ORDER — ESTRADIOL 1 MG PO TABS: 1.0000 mg | ORAL_TABLET | Freq: Every day | ORAL | 1 refills | Status: DC

## 2022-02-26 MED ORDER — AMLODIPINE BESYLATE 10 MG PO TABS: 10.0000 mg | ORAL_TABLET | Freq: Every day | ORAL | 0 refills | Status: DC

## 2022-02-26 MED ORDER — VITAMIN D (ERGOCALCIFEROL) 1.25 MG (50000 UNIT) PO CAPS: 50000.0000 [IU] | ORAL_CAPSULE | ORAL | 0 refills | Status: DC

## 2022-02-26 NOTE — Telephone Encounter (Signed)
Pt medication sent to pharmacy  

## 2022-02-26 NOTE — Telephone Encounter (Signed)
Patient wants all her rx transferred to [facility] in [facility] - She said they should all be transferred now except 2 and she needs refills on these. Amlodipine 9tabs 10 mg and Estrdiol '1mg'$  90 tabs

## 2022-03-01 DIAGNOSIS — M25511 Pain in right shoulder: Secondary | ICD-10-CM | POA: Diagnosis not present

## 2022-03-05 ENCOUNTER — Ambulatory Visit (INDEPENDENT_AMBULATORY_CARE_PROVIDER_SITE_OTHER): Payer: Medicare Other | Admitting: *Deleted

## 2022-03-05 DIAGNOSIS — Z0000 Encounter for general adult medical examination without abnormal findings: Secondary | ICD-10-CM | POA: Diagnosis not present

## 2022-03-05 NOTE — Patient Instructions (Signed)
[name] , Thank you for taking time to come for your Medicare Wellness Visit. I appreciate your ongoing commitment to your health goals. Please review the following plan we discussed and let me know if I can assist you in the future.   Screening recommendations/referrals: Colonoscopy: Completed Mammogram: Completed Bone Density: Completed Recommended yearly ophthalmology/optometry visit for glaucoma screening and checkup Recommended yearly dental visit for hygiene and checkup  Vaccinations: Influenza vaccine: Completed Pneumococcal vaccine: Patient declined Tdap vaccine: Completed Shingles vaccine: Patient declined    Advanced directives: up to date  Conditions/risks identified: Hypertension, diabetes  Next appointment: 1 year   Preventive Care 59 Years and Older, Female Preventive care refers to lifestyle choices and visits with your health care provider that can promote health and wellness. What does preventive care include? A yearly physical exam. This is also called an annual well check. Dental exams once or twice a year. Routine eye exams. Ask your health care provider how often you should have your eyes checked. Personal lifestyle choices, including: Daily care of your teeth and gums. Regular physical activity. Eating a healthy diet. Avoiding tobacco and drug use. Limiting alcohol use. Practicing safe sex. Taking low-dose aspirin every day. Taking vitamin and mineral supplements as recommended by your health care provider. What happens during an annual well check? The services and screenings done by your health care provider during your annual well check will depend on your age, overall health, lifestyle risk factors, and family history of disease. Counseling  Your health care provider may ask you questions about your: Alcohol use. Tobacco use. Drug use. Emotional well-being. Home and relationship well-being. Sexual activity. Eating habits. History of  falls. Memory and ability to understand (cognition). Work and work Statistician. Reproductive health. Screening  You may have the following tests or measurements: Height, weight, and BMI. Blood pressure. Lipid and cholesterol levels. These may be checked every 5 years, or more frequently if you are over 49 years old. Skin check. Lung cancer screening. You may have this screening every year starting at age 16 if you have a 30-pack-year history of smoking and currently smoke or have quit within the past 15 years. Fecal occult blood test (FOBT) of the stool. You may have this test every year starting at age 18. Flexible sigmoidoscopy or colonoscopy. You may have a sigmoidoscopy every 5 years or a colonoscopy every 10 years starting at age 73. Hepatitis C blood test. Hepatitis B blood test. Sexually transmitted disease (STD) testing. Diabetes screening. This is done by checking your blood sugar (glucose) after you have not eaten for a while (fasting). You may have this done every 1-3 years. Bone density scan. This is done to screen for osteoporosis. You may have this done starting at age 35. Mammogram. This may be done every 1-2 years. Talk to your health care provider about how often you should have regular mammograms. Talk with your health care provider about your test results, treatment options, and if necessary, the need for more tests. Vaccines  Your health care provider may recommend certain vaccines, such as: Influenza vaccine. This is recommended every year. Tetanus, diphtheria, and acellular pertussis (Tdap, Td) vaccine. You may need a Td booster every 10 years. Zoster vaccine. You may need this after age 108. Pneumococcal 13-valent conjugate (PCV13) vaccine. One dose is recommended after age 52. Pneumococcal polysaccharide (PPSV23) vaccine. One dose is recommended after age 15. Talk to your health care provider about which screenings and vaccines you need and how often you  need  them. This information is not intended to replace advice given to you by your health care provider. Make sure you discuss any questions you have with your health care provider. Document Released: 10/13/2015 Document Revised: 06/05/2016 Document Reviewed: 07/18/2015 Elsevier Interactive Patient Education  2017 [employer] Prevention in the Home Falls can cause injuries. They can happen to people of all ages. There are many things you can do to make your home safe and to help prevent falls. What can I do on the outside of my home? Regularly fix the edges of walkways and driveways and fix any cracks. Remove anything that might make you trip as you walk through a door, such as a raised step or threshold. Trim any bushes or trees on the path to your home. Use bright outdoor lighting. Clear any walking paths of anything that might make someone trip, such as rocks or tools. Regularly check to see if handrails are loose or broken. Make sure that both sides of any steps have handrails. Any raised decks and porches should have guardrails on the edges. Have any leaves, snow, or ice cleared regularly. Use sand or salt on walking paths during winter. Clean up any spills in your garage right away. This includes oil or grease spills. What can I do in the bathroom? Use night lights. Install grab bars by the toilet and in the tub and shower. Do not use towel bars as grab bars. Use non-skid mats or decals in the tub or shower. If you need to sit down in the shower, use a plastic, non-slip stool. Keep the floor dry. Clean up any water that spills on the floor as soon as it happens. Remove soap buildup in the tub or shower regularly. Attach bath mats securely with double-sided non-slip rug tape. Do not have throw rugs and other things on the floor that can make you trip. What can I do in the bedroom? Use night lights. Make sure that you have a light by your bed that is easy to reach. Do not use  any sheets or blankets that are too big for your bed. They should not hang down onto the floor. Have a firm chair that has side arms. You can use this for support while you get dressed. Do not have throw rugs and other things on the floor that can make you trip. What can I do in the kitchen? Clean up any spills right away. Avoid walking on wet floors. Keep items that you use a lot in easy-to-reach places. If you need to reach something above you, use a strong step stool that has a grab bar. Keep electrical cords out of the way. Do not use floor polish or wax that makes floors slippery. If you must use wax, use non-skid floor wax. Do not have throw rugs and other things on the floor that can make you trip. What can I do with my stairs? Do not leave any items on the stairs. Make sure that there are handrails on both sides of the stairs and use them. Fix handrails that are broken or loose. Make sure that handrails are as long as the stairways. Check any carpeting to make sure that it is firmly attached to the stairs. Fix any carpet that is loose or worn. Avoid having throw rugs at the top or bottom of the stairs. If you do have throw rugs, attach them to the floor with carpet tape. Make sure that you have a light switch  at the top of the stairs and the bottom of the stairs. If you do not have them, ask someone to add them for you. What else can I do to help prevent falls? Wear shoes that: Do not have high heels. Have rubber bottoms. Are comfortable and fit you well. Are closed at the toe. Do not wear sandals. If you use a stepladder: Make sure that it is fully opened. Do not climb a closed stepladder. Make sure that both sides of the stepladder are locked into place. Ask someone to hold it for you, if possible. Clearly mark and make sure that you can see: Any grab bars or handrails. First and last steps. Where the edge of each step is. Use tools that help you move around (mobility aids)  if they are needed. These include: Canes. Walkers. Scooters. Crutches. Turn on the lights when you go into a dark area. Replace any light bulbs as soon as they burn out. Set up your furniture so you have a clear path. Avoid moving your furniture around. If any of your floors are uneven, fix them. If there are any pets around you, be aware of where they are. Review your medicines with your doctor. Some medicines can make you feel dizzy. This can increase your chance of falling. Ask your doctor what other things that you can do to help prevent falls. This information is not intended to replace advice given to you by your health care provider. Make sure you discuss any questions you have with your health care provider. Document Released: 07/13/2009 Document Revised: 02/22/2016 Document Reviewed: 10/21/2014 Elsevier Interactive Patient Education  2017 [employer].

## 2022-03-05 NOTE — Progress Notes (Signed)
Subjective:   [name] is a 71 y.o. female who presents for an Initial Medicare Annual Wellness Visit.  I connected with  [name] on 03/05/22 by a audio enabled telemedicine application and verified that I am speaking with the correct person using two identifiers.  Patient Location: Home  Provider Location: Home Office  I discussed the limitations of evaluation and management by telemedicine. The patient expressed understanding and agreed to proceed.   Review of Systems     [name] , Thank you for taking time to come for your Medicare Wellness Visit. I appreciate your ongoing commitment to your health goals. Please review the following plan we discussed and let me know if I can assist you in the future.   These are the goals we discussed:  Goals   None     This is a list of the screening recommended for you and due dates:  Health Maintenance  Topic Date Due   Zoster (Shingles) Vaccine (1 of 2) Never done   Pneumonia Vaccine (1 - PCV) Never done   COVID-19 Vaccine (5 - Booster for Moderna series) 04/25/2021   Flu Shot  04/30/2022   Complete foot exam   07/03/2022   Urine Protein Check  07/10/2022   Hemoglobin A1C  08/02/2022   Eye exam for diabetics  01/08/2023   Mammogram  03/30/2023   Cologuard (Stool DNA test)  03/07/2024   Tetanus Vaccine  07/12/2024   DEXA scan (bone density measurement)  Completed   Hepatitis C Screening: USPSTF Recommendation to screen - Ages 4-79 yo.  Completed   HPV Vaccine  Aged Out   Colon Cancer Screening  Discontinued          Objective:    There were no vitals filed for this visit. There is no height or weight on file to calculate BMI.     11/20/2021    9:18 AM 07/27/2021   11:53 AM 07/09/2021   11:17 AM 05/30/2021    3:14 PM 03/05/2020    7:26 AM 04/05/2016    2:50 PM 07/12/2014    3:24 PM  Advanced Directives  Does Patient Have a Medical Advance Directive? No Yes Yes Yes No No No  Type of Advance Directive   Healthcare Power of [place] will     Does patient want to make changes to medical advance directive?  No - Patient declined       Copy of [employer] in Chart?   No - copy requested      Would patient like information on creating a medical advance directive? No - Patient declined   No - Patient declined  No - patient declined information     Current Medications (verified) Outpatient Encounter Medications as of 03/05/2022  Medication Sig   Accu-Chek Softclix Lancets lancets USE TO CHECK BLOOD SUGAR UP TO FOUR TIMES DAILY   amLODipine (NORVASC) 10 MG tablet Take 1 tablet (10 mg total) by mouth daily.   blood glucose meter kit and supplies KIT Dispense based on patient and insurance preference. Use twice daily (before breakfast and before bed).   estradiol (ESTRACE) 1 MG tablet Take 1 tablet (1 mg total) by mouth daily.   glucose blood (ACCU-CHEK GUIDE) test strip Use as instructed to check blood glucose twice daily, once before breakfast and once before bedtime.   metoprolol tartrate (LOPRESSOR) 25 MG tablet Take 1 tablet (25 mg total) by mouth 2 (two)  times daily.   thyroid (NP THYROID) 60 MG tablet Take 1 tablet (60 mg total) by mouth daily before breakfast.   traMADol (ULTRAM) 50 MG tablet Take 1 tablet (50 mg total) by mouth every 6 (six) hours as needed for moderate pain or severe pain.   Vitamin D, Ergocalciferol, (DRISDOL) 1.25 MG (50000 UNIT) CAPS capsule Take 1 capsule (50,000 Units total) by mouth every 7 (seven) days.   No facility-administered encounter medications on file as of 03/05/2022.    Allergies (verified) Advair diskus [fluticasone-salmeterol], Codeine, Hydrocodone, Levothyroxine, Oxycodone, and Tetracyclines & related   History: Past Medical History:  Diagnosis Date   Arthritis    Asthma    takes Singulair nightly   Diabetes mellitus    takes levemir daily and Metformin daily   Generalized headaches    GERD  (gastroesophageal reflux disease)    takes Omeprazole daily   Herniated disc, cervical    History of kidney stones    Hyperlipidemia    takes Welchol daily   Hypertension    takes Enalapril dialy   Hypothyroidism    takes Synthroid daily   IBS (irritable bowel syndrome)    Joint pain    hands and neck   Nocturia    PONV (postoperative nausea and vomiting)    TIA (transient ischemic attack)    diagnosed by EEG but never knew she had it   Trigger finger    Weakness    Weakness    in both hands   Past Surgical History:  Procedure Laterality Date   ABDOMINAL HYSTERECTOMY  1980   ANTERIOR CERVICAL DECOMP/DISCECTOMY FUSION N/A 10/18/2013   Procedure: Cervical three-four, Cervical four-five, Cervical seven-Thoracic one anterior cervical decompression with fusion plating and bonegraft;  Surgeon: Hosie Spangle, MD;  Location: [facility];  Service: Neurosurgery;  Laterality: N/A;  Cervical three-four, Cervical four-five, Cervical seven-Thoracic one anterior cervical decompression with fusion plating and bonegraft   APPENDECTOMY     done with hysterectomy   BACK SURGERY  2007   lumbar fusion   carpel tunnel Bilateral    left first then right   cataract implants     CHOLECYSTECTOMY N/A 11/22/2021   Procedure: LAPAROSCOPIC CHOLECYSTECTOMY;  Surgeon: Rusty Aus, DO;  Location: [facility];  Service: General;  Laterality: N/A;   COLONOSCOPY     ESOPHAGOGASTRODUODENOSCOPY     hx of h pylori   EYE SURGERY  11/18/2008   right - cataracts   EYE SURGERY  06/01/2009   left - cataracts   herniated disc   10/2000, 08/31/2008   C5-C6 then C6-C7   IR URETERAL STENT RIGHT NEW ACCESS W/O SEP NEPHROSTOMY CATH  07/26/2021   knee anthroscopy bilateral Bilateral    KNEE ARTHROSCOPY     LITHOTRIPSY  11/12/2007   right side   NEPHROLITHOTOMY Right 07/26/2021   Procedure: NEPHROLITHOTOMY PERCUTANEOUS;  Surgeon: Franchot Gallo, MD;  Location: [facility];  Service: Urology;  Laterality: Right;    Parathyroid surgery     October 2013   PARATHYROIDECTOMY  07/02/2012   Procedure: PARATHYROIDECTOMY;  Surgeon: Earnstine Regal, MD;  Location: [facility];  Service: General;  Laterality: N/A;   TONSILLECTOMY     as a child   Family History  Problem Relation Age of Onset   Cancer Mother        pancreatic   Social History   Socioeconomic History   Marital status: Widowed    Spouse name: Not on file   Number of  children: Not on file   Years of education: Not on file   Highest education level: Not on file  Occupational History   Not on file  Tobacco Use   Smoking status: Former    Packs/day: 1.00    Years: 40.00    Pack years: 40.00    Types: Cigarettes    Quit date: 07/19/2012    Years since quitting: 9.6   Smokeless tobacco: Never   Tobacco comments:    quit smoking in August 07, 2012  Vaping Use   Vaping Use: Never used  Substance and Sexual Activity   Alcohol use: No    Alcohol/week: 0.0 standard drinks   Drug use: No   Sexual activity: Not on file  Other Topics Concern   Not on file  Social History Narrative   Widow,husband died 08/08/19 after 62 years marriage.Retired.   Social Determinants of Health   Financial Resource Strain: Not on file  Food Insecurity: Not on file  Transportation Needs: Not on file  Physical Activity: Not on file  Stress: Not on file  Social Connections: Not on file    Tobacco Counseling Counseling given: Not Answered Tobacco comments: quit smoking in 08/07/12   Clinical Intake:              How often do you need to have someone help you when you read instructions, pamphlets, or other written materials from your doctor or pharmacy?: (P) 1 - Never  Diabetic?Nutrition Risk Assessment:  Has the patient had any N/V/D within the last 2 months?  No  Does the patient have any non-healing wounds?  No  Has the patient had any unintentional weight loss or weight gain?  No   Diabetes:  Is the patient diabetic?  Yes  If diabetic,  was a CBG obtained today?  No  Did the patient bring in their glucometer from home?  No  How often do you monitor your CBG's? daily.   Financial Strains and Diabetes Management:  Are you having any financial strains with the device, your supplies or your medication? No .  Does the patient want to be seen by Chronic Care Management for management of their diabetes?  No  Would the patient like to be referred to a Nutritionist or for Diabetic Management?  No   Diabetic Exams:  Diabetic Eye Exam: Completed 01-07-22. \ Diabetic Foot Exam: Completed 07-03-21. Pt has been advised about the importance           Activities of Daily Living    03/01/2022   10:29 PM 11/20/2021    9:20 AM  In your present state of health, do you have any difficulty performing the following activities:  Hearing? 0   Vision? 0   Difficulty concentrating or making decisions? 0   Walking or climbing stairs? 0   Dressing or bathing? 0   Doing errands, shopping? 0 0  Preparing Food and eating ? N   Using the Toilet? N   In the past six months, have you accidently leaked urine? Y   Do you have problems with loss of bowel control? N   Managing your Medications? N   Managing your Finances? N   Housekeeping or managing your Housekeeping? N     Patient Care Team: Lindell Spar, MD as PCP - General (Internal Medicine) Harl Bowie Alphonse Guild, MD as PCP - Cardiology (Cardiology)  Indicate any recent Medical Services you may have received from other than [facility] providers in the past  year (date may be approximate).     Assessment:   This is a routine wellness examination for Brandi.  Hearing/Vision screen No results found.  Dietary issues and exercise activities discussed:     Goals Addressed   None   Depression Screen    01/09/2022   10:21 AM 11/05/2021    8:09 AM 07/03/2021    8:25 AM 02/28/2021    9:59 AM 08/21/2020   10:01 AM 01/17/2020   10:27 AM 07/01/2018    9:03 AM  PHQ 2/9 Scores  PHQ - 2 Score 0 0  0 0 0 0 0  PHQ- 9 Score    0 0    Exception Documentation      Medical reason     Fall Risk    03/01/2022   10:29 PM 01/09/2022   10:21 AM 11/06/2021    1:02 PM 11/05/2021    8:09 AM 07/03/2021    8:25 AM  Fall Risk   Falls in the past year? 0 0 0 1 0  Number falls in past yr: 0 0  0 0  Injury with Fall? 0 0  1 0  Risk for fall due to :  No Fall Risks  History of fall(s);Impaired mobility No Fall Risks  Follow up  Falls evaluation completed Falls evaluation completed Falls evaluation completed;Falls prevention discussed Falls evaluation completed    FALL RISK PREVENTION PERTAINING TO THE HOME:  Any stairs in or around the home? Yes  If so, are there any without handrails? Yes  Home free of loose throw rugs in walkways, pet beds, electrical cords, etc? Yes  Adequate lighting in your home to reduce risk of falls? Yes   ASSISTIVE DEVICES UTILIZED TO PREVENT FALLS:  Life alert? No  Use of a cane, walker or w/c? No  Grab bars in the bathroom? No  Shower chair or bench in shower? No  Elevated toilet seat or a handicapped toilet? No    Cognitive Function:        02/28/2021   10:32 AM  6CIT Screen  What Year? 0 points  What month? 0 points  What time? 0 points  Count back from 20 0 points  Months in reverse 0 points  Repeat phrase 0 points  Total Score 0 points    Immunizations Immunization History  Administered Date(s) Administered   Fluad Quad(high Dose 65+) 08/17/2019, 07/29/2021   Influenza Split 07/31/2013   Influenza, High Dose Seasonal PF 07/01/2017, 07/14/2020   Influenza-Unspecified 07/09/2017   Moderna Sars-Covid-2 Vaccination 11/24/2019, 12/22/2019, 08/30/2020, 02/28/2021   Tdap 07/12/2014    TDAP status: Up to date  Flu Vaccine status: Up to date  Pneumococcal vaccine status: Declined,  Education has been provided regarding the importance of this vaccine but patient still declined. Advised may receive this vaccine at local pharmacy or Health Dept. Aware to  provide a copy of the vaccination record if obtained from local pharmacy or Health Dept. Verbalized acceptance and understanding.   Covid-19 vaccine status: Completed vaccines  Qualifies for Shingles Vaccine? Yes   Zostavax completed No   Shingrix Completed?: No.    Education has been provided regarding the importance of this vaccine. Patient has been advised to call insurance company to determine out of pocket expense if they have not yet received this vaccine. Advised may also receive vaccine at local pharmacy or Health Dept. Verbalized acceptance and understanding.  Screening Tests Health Maintenance  Topic Date Due   Zoster Vaccines- Shingrix (1 of 2)  Never done   Pneumonia Vaccine 72+ Years old (1 - PCV) Never done   COVID-19 Vaccine (5 - Booster for Moderna series) 04/25/2021   INFLUENZA VACCINE  04/30/2022   FOOT EXAM  07/03/2022   URINE MICROALBUMIN  07/10/2022   HEMOGLOBIN A1C  08/02/2022   OPHTHALMOLOGY EXAM  01/08/2023   MAMMOGRAM  03/30/2023   Fecal DNA (Cologuard)  03/07/2024   TETANUS/TDAP  07/12/2024   DEXA SCAN  Completed   Hepatitis C Screening  Completed   HPV VACCINES  Aged Out   COLONOSCOPY (Pts 45-35yr Insurance coverage will need to be confirmed)  Discontinued    Health Maintenance  Health Maintenance Due  Topic Date Due   Zoster Vaccines- Shingrix (1 of 2) Never done   Pneumonia Vaccine 71 Years old (1 - PCV) Never done   COVID-19 Vaccine (5 - Booster for Moderna series) 04/25/2021    Cologuard: completed 03-07-21 due 03-07-24  Mammogram status: Completed 03-29-21. Repeat every year  Bone Density status: Completed 06-28-18. Results reflect: Bone density results: NORMAL. Repeat every 5 years.  Lung Cancer Screening: (Low Dose CT Chest recommended if Age 73-80years, 30 pack-year currently smoking OR have quit w/in 15years.) does qualify.   Lung Cancer Screening Referral: patient declined  Additional Screening:  Hepatitis C Screening: does qualify;  Completed 02-28-21  Vision Screening: Recommended annual ophthalmology exams for early detection of glaucoma and other disorders of the eye. Is the patient up to date with their annual eye exam?  Yes  Who is the provider or what is the name of the office in which the patient attends annual eye exams? [facility] eye dr rLinna HoffIf pt is not established with a provider, would they like to be referred to a provider to establish care? No .   Dental Screening: Recommended annual dental exams for proper oral hygiene  Community Resource Referral / Chronic Care Management: CRR required this visit?  No   CCM required this visit?  No      Plan:     I have personally reviewed and noted the following in the patient's chart:   Medical and social history Use of alcohol, tobacco or illicit drugs  Current medications and supplements including opioid prescriptions. Patient is not currently taking opioid prescriptions. Functional ability and status Nutritional status Physical activity Advanced directives List of other physicians Hospitalizations, surgeries, and ER visits in previous 12 months Vitals Screenings to include cognitive, depression, and falls Referrals and appointments  In addition, I have reviewed and discussed with patient certain preventive protocols, quality metrics, and best practice recommendations. A written personalized care plan for preventive services as well as general preventive health recommendations were provided to patient.     SShelda Altes CMA   03/05/2022   Nurse Notes:  Ms. LMartes, Thank you for taking time to come for your Medicare Wellness Visit. I appreciate your ongoing commitment to your health goals. Please review the following plan we discussed and let me know if I can assist you in the future.   These are the goals we discussed:  Goals   None     This is a list of the screening recommended for you and due dates:  Health Maintenance  Topic Date Due    Zoster (Shingles) Vaccine (1 of 2) Never done   Pneumonia Vaccine (1 - PCV) Never done   COVID-19 Vaccine (5 - Booster for Moderna series) 04/25/2021   Flu Shot  04/30/2022   Complete foot exam  07/03/2022   Urine Protein Check  07/10/2022   Hemoglobin A1C  08/02/2022   Eye exam for diabetics  01/08/2023   Mammogram  03/30/2023   Cologuard (Stool DNA test)  03/07/2024   Tetanus Vaccine  07/12/2024   DEXA scan (bone density measurement)  Completed   Hepatitis C Screening: USPSTF Recommendation to screen - Ages 42-79 yo.  Completed   HPV Vaccine  Aged Out   Colon Cancer Screening  Discontinued

## 2022-03-06 DIAGNOSIS — S46001D Unspecified injury of muscle(s) and tendon(s) of the rotator cuff of right shoulder, subsequent encounter: Secondary | ICD-10-CM | POA: Diagnosis not present

## 2022-03-06 DIAGNOSIS — M7551 Bursitis of right shoulder: Secondary | ICD-10-CM | POA: Diagnosis not present

## 2022-03-06 DIAGNOSIS — M19011 Primary osteoarthritis, right shoulder: Secondary | ICD-10-CM | POA: Diagnosis not present

## 2022-03-06 DIAGNOSIS — M7541 Impingement syndrome of right shoulder: Secondary | ICD-10-CM | POA: Diagnosis not present

## 2022-03-12 DIAGNOSIS — M19011 Primary osteoarthritis, right shoulder: Secondary | ICD-10-CM | POA: Diagnosis not present

## 2022-03-12 DIAGNOSIS — M7551 Bursitis of right shoulder: Secondary | ICD-10-CM | POA: Diagnosis not present

## 2022-03-12 DIAGNOSIS — M7541 Impingement syndrome of right shoulder: Secondary | ICD-10-CM | POA: Diagnosis not present

## 2022-03-12 DIAGNOSIS — S46001D Unspecified injury of muscle(s) and tendon(s) of the rotator cuff of right shoulder, subsequent encounter: Secondary | ICD-10-CM | POA: Diagnosis not present

## 2022-03-14 DIAGNOSIS — S46001D Unspecified injury of muscle(s) and tendon(s) of the rotator cuff of right shoulder, subsequent encounter: Secondary | ICD-10-CM | POA: Diagnosis not present

## 2022-03-14 DIAGNOSIS — M7551 Bursitis of right shoulder: Secondary | ICD-10-CM | POA: Diagnosis not present

## 2022-03-14 DIAGNOSIS — M7541 Impingement syndrome of right shoulder: Secondary | ICD-10-CM | POA: Diagnosis not present

## 2022-03-14 DIAGNOSIS — M19011 Primary osteoarthritis, right shoulder: Secondary | ICD-10-CM | POA: Diagnosis not present

## 2022-03-18 DIAGNOSIS — M7551 Bursitis of right shoulder: Secondary | ICD-10-CM | POA: Diagnosis not present

## 2022-03-18 DIAGNOSIS — M7541 Impingement syndrome of right shoulder: Secondary | ICD-10-CM | POA: Diagnosis not present

## 2022-03-18 DIAGNOSIS — M19011 Primary osteoarthritis, right shoulder: Secondary | ICD-10-CM | POA: Diagnosis not present

## 2022-03-18 DIAGNOSIS — S46001D Unspecified injury of muscle(s) and tendon(s) of the rotator cuff of right shoulder, subsequent encounter: Secondary | ICD-10-CM | POA: Diagnosis not present

## 2022-03-20 DIAGNOSIS — N200 Calculus of kidney: Secondary | ICD-10-CM | POA: Diagnosis not present

## 2022-03-21 DIAGNOSIS — M7551 Bursitis of right shoulder: Secondary | ICD-10-CM | POA: Diagnosis not present

## 2022-03-21 DIAGNOSIS — S46001D Unspecified injury of muscle(s) and tendon(s) of the rotator cuff of right shoulder, subsequent encounter: Secondary | ICD-10-CM | POA: Diagnosis not present

## 2022-03-21 DIAGNOSIS — M19011 Primary osteoarthritis, right shoulder: Secondary | ICD-10-CM | POA: Diagnosis not present

## 2022-03-21 DIAGNOSIS — M7541 Impingement syndrome of right shoulder: Secondary | ICD-10-CM | POA: Diagnosis not present

## 2022-03-25 DIAGNOSIS — S46001D Unspecified injury of muscle(s) and tendon(s) of the rotator cuff of right shoulder, subsequent encounter: Secondary | ICD-10-CM | POA: Diagnosis not present

## 2022-03-25 DIAGNOSIS — M7551 Bursitis of right shoulder: Secondary | ICD-10-CM | POA: Diagnosis not present

## 2022-03-25 DIAGNOSIS — M19011 Primary osteoarthritis, right shoulder: Secondary | ICD-10-CM | POA: Diagnosis not present

## 2022-03-25 DIAGNOSIS — M7541 Impingement syndrome of right shoulder: Secondary | ICD-10-CM | POA: Diagnosis not present

## 2022-03-27 ENCOUNTER — Other Ambulatory Visit: Payer: Self-pay | Admitting: Urology

## 2022-03-28 ENCOUNTER — Ambulatory Visit (INDEPENDENT_AMBULATORY_CARE_PROVIDER_SITE_OTHER): Payer: Medicare Other | Admitting: Internal Medicine

## 2022-03-28 ENCOUNTER — Encounter (HOSPITAL_BASED_OUTPATIENT_CLINIC_OR_DEPARTMENT_OTHER): Payer: Self-pay | Admitting: Urology

## 2022-03-28 ENCOUNTER — Encounter: Payer: Self-pay | Admitting: Internal Medicine

## 2022-03-28 VITALS — BP 138/58 | HR 82 | Resp 18 | Ht 66.0 in | Wt 148.8 lb

## 2022-03-28 DIAGNOSIS — E039 Hypothyroidism, unspecified: Secondary | ICD-10-CM | POA: Diagnosis not present

## 2022-03-28 DIAGNOSIS — I10 Essential (primary) hypertension: Secondary | ICD-10-CM

## 2022-03-28 DIAGNOSIS — E1169 Type 2 diabetes mellitus with other specified complication: Secondary | ICD-10-CM

## 2022-03-28 DIAGNOSIS — N200 Calculus of kidney: Secondary | ICD-10-CM

## 2022-03-28 DIAGNOSIS — R300 Dysuria: Secondary | ICD-10-CM

## 2022-03-28 LAB — POCT URINALYSIS DIP (CLINITEK)
Bilirubin, UA: NEGATIVE
Glucose, UA: 100 mg/dL — AB
Ketones, POC UA: NEGATIVE mg/dL
Leukocytes, UA: NEGATIVE
Nitrite, UA: NEGATIVE
POC PROTEIN,UA: NEGATIVE
Spec Grav, UA: 1.02 (ref 1.010–1.025)
Urobilinogen, UA: 0.2 E.U./dL
pH, UA: 5.5 (ref 5.0–8.0)

## 2022-03-28 NOTE — Progress Notes (Signed)
Spoke w/ via phone for pre-op interview--- [employer]----  [employer]             Lab results------ Current EKG in Epic dated 12/2021 COVID test -----patient states asymptomatic no test needed Arrive at -------0630 NPO after MN NO Solid Food.  Med rec completed Medications to take morning of surgery ----- NP Thyroid and Metoprolol. Diabetic medication ----- Patient instructed no nail polish to be worn day of surgery Patient instructed to bring photo id and insurance card day of surgery Patient aware to have Driver (ride ) / caregiver  [name]  for 24 hours after surgery  Patient Special Instructions ----- Pre-Op special Istructions ----- Patient verbalized understanding of instructions that were given at this phone interview. Patient denies shortness of breath, chest pain, fever, cough at this phone interview.

## 2022-03-28 NOTE — Assessment & Plan Note (Signed)
Followed by urology -planned to have cystoscopy with stent placement tramadol as needed for flank pain UA negative for LE or nitrite

## 2022-03-28 NOTE — Assessment & Plan Note (Signed)
BP Readings from Last 1 Encounters:  03/28/22 (!) 138/58   Overall remains well-controlled with Amlodipine and Metoprolol Counseled for compliance with the medications Advised DASH diet and moderate exercise/walking, at least 150 mins/week

## 2022-03-28 NOTE — Patient Instructions (Addendum)
Please continue taking medications as prescribed.  Please continue to follow low carb diet and ambulate as tolerated. 

## 2022-03-28 NOTE — Assessment & Plan Note (Signed)
On NP thyroid Chart review suggests normal TSH Plan to down titrate NP thyroid and switch to Levothyroxine if abnormal TSH -followed by Endocrinology 

## 2022-03-28 NOTE — Assessment & Plan Note (Signed)
Lab Results  Component Value Date   HGBA1C 7.1 01/30/2022   Diet controlled Was on medications in the past Does not prefer to take medications for DM or statin, agrees to follow strict low-carb diet for now Advised to follow up with Ophthalmology for diabetic eye exam.

## 2022-03-28 NOTE — Progress Notes (Addendum)
Established Patient Office Visit  Subjective:  Patient ID: [name], female    DOB: [birth year]  Age: 71 y.o. MRN: [id number]  CC:  Chief Complaint  Patient presents with   Follow-up    Pt wants to move follow up out in august also pt is having kidney stone surgery in 12 days and wants to make sure she doesn't have infection    HPI [name] is a 72 y.o. female with past medical history of HTN, GERD, type II DM, hypothyroidism, HLD and parathyroid adenoma who presents for f/u of her chronic medical conditions.  Nephrolithiasis: She is planned to get cystoscopy with stent placement.  Her urologist suggested getting UA to rule out UTI before the procedure.  She has mild lower abdominal discomfort, but denies any gross hematuria currently.  Denies any fever, chills, nausea, vomiting or urinary hesitancy.  HTN: BP is well-controlled. Takes medications regularly. Patient denies headache, dizziness, chest pain, dyspnea or palpitations.  She is still on NP thyroid for hypothyroidism.  Followed by endocrinology.  Past Medical History:  Diagnosis Date   Arthritis    Asthma    takes Singulair nightly   Diabetes mellitus    takes levemir daily and Metformin daily   Generalized headaches    GERD (gastroesophageal reflux disease)    takes Omeprazole daily   Herniated disc, cervical    History of kidney stones    Hyperlipidemia    takes Welchol daily   Hypertension    takes Enalapril dialy   Hypothyroidism    takes Synthroid daily   IBS (irritable bowel syndrome)    Joint pain    hands and neck   Nocturia    PONV (postoperative nausea and vomiting)    TIA (transient ischemic attack)    diagnosed by EEG but never knew she had it   Trigger finger    Weakness    Weakness    in both hands    Past Surgical History:  Procedure Laterality Date   ABDOMINAL HYSTERECTOMY  1980   ANTERIOR CERVICAL DECOMP/DISCECTOMY FUSION N/A 10/18/2013   Procedure: Cervical three-four,  Cervical four-five, Cervical seven-Thoracic one anterior cervical decompression with fusion plating and bonegraft;  Surgeon: Hosie Spangle, MD;  Location: [facility];  Service: Neurosurgery;  Laterality: N/A;  Cervical three-four, Cervical four-five, Cervical seven-Thoracic one anterior cervical decompression with fusion plating and bonegraft   APPENDECTOMY     done with hysterectomy   BACK SURGERY  2007   lumbar fusion   carpel tunnel Bilateral    left first then right   cataract implants     CHOLECYSTECTOMY N/A 11/22/2021   Procedure: LAPAROSCOPIC CHOLECYSTECTOMY;  Surgeon: Rusty Aus, DO;  Location: [facility];  Service: General;  Laterality: N/A;   COLONOSCOPY     ESOPHAGOGASTRODUODENOSCOPY     hx of h pylori   EYE SURGERY  11/18/2008   right - cataracts   EYE SURGERY  06/01/2009   left - cataracts   herniated disc   10/2000, 08/31/2008   C5-C6 then C6-C7   IR URETERAL STENT RIGHT NEW ACCESS W/O SEP NEPHROSTOMY CATH  07/26/2021   knee anthroscopy bilateral Bilateral    KNEE ARTHROSCOPY     LITHOTRIPSY  11/12/2007   right side   NEPHROLITHOTOMY Right 07/26/2021   Procedure: NEPHROLITHOTOMY PERCUTANEOUS;  Surgeon: Franchot Gallo, MD;  Location: [facility];  Service: Urology;  Laterality: Right;   Parathyroid surgery     October 2013  PARATHYROIDECTOMY  07/02/2012   Procedure: PARATHYROIDECTOMY;  Surgeon: Earnstine Regal, MD;  Location: [facility];  Service: General;  Laterality: N/A;   TONSILLECTOMY     as a child    Family History  Problem Relation Age of Onset   Cancer Mother        pancreatic    Social History   Socioeconomic History   Marital status: Widowed    Spouse name: Not on file   Number of children: Not on file   Years of education: Not on file   Highest education level: Not on file  Occupational History   Not on file  Tobacco Use   Smoking status: Former    Packs/day: 1.00    Years: 40.00    Total pack years: 40.00    Types: Cigarettes    Quit  date: 07/19/2012    Years since quitting: 9.6   Smokeless tobacco: Never   Tobacco comments:    quit smoking in 07/26/12  Vaping Use   Vaping Use: Never used  Substance and Sexual Activity   Alcohol use: No    Alcohol/week: 0.0 standard drinks of alcohol   Drug use: No   Sexual activity: Not Currently    Birth control/protection: Surgical  Other Topics Concern   Not on file  Social History Narrative   Widow,husband died 2019-07-27 after 61 years marriage.Retired.   Social Determinants of Health   Financial Resource Strain: Low Risk  (03/05/2022)   Overall Financial Resource Strain (CARDIA)    Difficulty of Paying Living Expenses: Not hard at all  Food Insecurity: No Food Insecurity (03/05/2022)   Hunger Vital Sign    Worried About Running Out of Food in the Last Year: Never true    Ran Out of Food in the Last Year: Never true  Transportation Needs: No Transportation Needs (03/05/2022)   PRAPARE - Hydrologist (Medical): No    Lack of Transportation (Non-Medical): No  Physical Activity: Insufficiently Active (03/05/2022)   Exercise Vital Sign    Days of Exercise per Week: 4 days    Minutes of Exercise per Session: 30 min  Stress: No Stress Concern Present (03/05/2022)   [place]    Feeling of Stress : Not at all  Social Connections: Moderately Integrated (03/05/2022)   Social Connection and Isolation Panel [NHANES]    Frequency of Communication with Friends and Family: More than three times a week    Frequency of Social Gatherings with Friends and Family: More than three times a week    Attends Religious Services: More than 4 times per year    Active Member of [employer] or Organizations: Yes    Attends Archivist Meetings: More than 4 times per year    Marital Status: Widowed  Intimate Partner Violence: Not At Risk (03/05/2022)   Humiliation, Afraid, Rape, and Kick questionnaire     Fear of Current or Ex-Partner: No    Emotionally Abused: No    Physically Abused: No    Sexually Abused: No    Outpatient Medications Prior to Visit  Medication Sig Dispense Refill   Accu-Chek Softclix Lancets lancets USE TO CHECK BLOOD SUGAR UP TO FOUR TIMES DAILY 100 each 2   amLODipine (NORVASC) 10 MG tablet Take 1 tablet (10 mg total) by mouth daily. 90 tablet 0   blood glucose meter kit and supplies KIT Dispense based on patient and insurance preference.  Use twice daily (before breakfast and before bed). 1 each 0   estradiol (ESTRACE) 1 MG tablet Take 1 tablet (1 mg total) by mouth daily. 90 tablet 1   glucose blood (ACCU-CHEK GUIDE) test strip Use as instructed to check blood glucose twice daily, once before breakfast and once before bedtime. 200 strip 1   metoprolol tartrate (LOPRESSOR) 25 MG tablet Take 1 tablet (25 mg total) by mouth 2 (two) times daily. 180 tablet 3   thyroid (NP THYROID) 60 MG tablet Take 1 tablet (60 mg total) by mouth daily before breakfast. 90 tablet 1   traMADol (ULTRAM) 50 MG tablet Take 1 tablet (50 mg total) by mouth every 6 (six) hours as needed for moderate pain or severe pain. 20 tablet 0   Vitamin D, Ergocalciferol, (DRISDOL) 1.25 MG (50000 UNIT) CAPS capsule Take 1 capsule (50,000 Units total) by mouth every 7 (seven) days. 12 capsule 0   No facility-administered medications prior to visit.    Allergies  Allergen Reactions   Advair Diskus [Fluticasone-Salmeterol] Anaphylaxis   Codeine Nausea And Vomiting   Hydrocodone Nausea And Vomiting   Levothyroxine Other (See Comments)    Caused lower leg swelling   Oxycodone Nausea And Vomiting   Tetracyclines & Related Itching    Internal    ROS Review of Systems  Constitutional:  Positive for fatigue. Negative for chills and fever.  HENT:  Negative for congestion, sinus pressure, sinus pain and sore throat.   Eyes:  Negative for pain and discharge.  Respiratory:  Negative for cough and shortness of  breath.   Cardiovascular:  Negative for chest pain and palpitations.  Gastrointestinal:  Positive for abdominal pain. Negative for diarrhea, nausea and vomiting.  Endocrine: Negative for polydipsia and polyuria.  Genitourinary:  Negative for dysuria and hematuria.  Musculoskeletal:  Positive for arthralgias (Right shoulder) and myalgias. Negative for neck pain and neck stiffness.  Skin:  Negative for rash.  Neurological:  Negative for dizziness and weakness.  Psychiatric/Behavioral:  Negative for agitation and behavioral problems.       Objective:    Physical Exam Vitals reviewed.  Constitutional:      General: She is not in acute distress.    Appearance: She is not diaphoretic.  HENT:     Head: Normocephalic and atraumatic.     Nose: Nose normal.     Mouth/Throat:     Mouth: Mucous membranes are moist.  Eyes:     General: No scleral icterus.    Extraocular Movements: Extraocular movements intact.  Neck:     Comments: Scar noted, site C/D/I Cardiovascular:     Rate and Rhythm: Normal rate and regular rhythm.     Pulses: Normal pulses.     Heart sounds: Normal heart sounds. No murmur heard. Pulmonary:     Breath sounds: Normal breath sounds. No wheezing or rales.  Abdominal:     Palpations: Abdomen is soft.     Tenderness: There is abdominal tenderness (Mild, suprapubic).  Musculoskeletal:     Cervical back: Neck supple. No tenderness.     Right lower leg: No edema.     Left lower leg: No edema.  Skin:    General: Skin is warm.     Findings: No rash.  Neurological:     General: No focal deficit present.     Mental Status: She is alert and oriented to person, place, and time.  Psychiatric:        Mood and Affect: Mood normal.  Behavior: Behavior normal.     BP (!) 138/58 (BP Location: Right Arm, Patient Position: Sitting, Cuff Size: Normal)   Pulse 82   Resp 18   Ht 5' 6"  (1.676 m)   Wt 148 lb 12.8 oz (67.5 kg)   SpO2 99%   BMI 24.02 kg/m  Wt Readings  from Last 3 Encounters:  03/28/22 148 lb (67.1 kg)  03/28/22 148 lb 12.8 oz (67.5 kg)  01/30/22 149 lb (67.6 kg)    Lab Results  Component Value Date   TSH 2.300 01/23/2022   Lab Results  Component Value Date   WBC 5.9 07/26/2021   HGB 13.1 07/27/2021   HCT 40.2 07/27/2021   MCV 88.2 07/26/2021   PLT 196 07/26/2021   Lab Results  Component Value Date   NA 139 01/23/2022   K 5.3 (H) 01/23/2022   CO2 18 (L) 01/23/2022   GLUCOSE 205 (H) 01/23/2022   BUN 14 01/23/2022   CREATININE 0.91 01/23/2022   BILITOT 0.4 01/23/2022   ALKPHOS 74 01/23/2022   AST 27 01/23/2022   ALT 29 01/23/2022   PROT 6.2 01/23/2022   ALBUMIN 4.3 01/23/2022   CALCIUM 9.4 01/23/2022   ANIONGAP 7 11/20/2021   EGFR 67 01/23/2022   Lab Results  Component Value Date   CHOL 163 01/23/2022   Lab Results  Component Value Date   HDL 32 (L) 01/23/2022   Lab Results  Component Value Date   LDLCALC 87 01/23/2022   Lab Results  Component Value Date   TRIG 264 (H) 01/23/2022   Lab Results  Component Value Date   CHOLHDL 5.1 (H) 01/23/2022   Lab Results  Component Value Date   HGBA1C 7.1 01/30/2022      Assessment & Plan:   Problem List Items Addressed This Visit       Cardiovascular and Mediastinum   Benign hypertension    BP Readings from Last 1 Encounters:  03/28/22 (!) 138/58  Overall remains well-controlled with Amlodipine and Metoprolol Counseled for compliance with the medications Advised DASH diet and moderate exercise/walking, at least 150 mins/week        Endocrine   Type 2 diabetes mellitus with other specified complication ([place])    Lab Results  Component Value Date   HGBA1C 7.1 01/30/2022  Diet controlled Was on medications in the past Does not prefer to take medications for DM or statin, agrees to follow strict low-carb diet for now Advised to follow up with Ophthalmology for diabetic eye exam.      Primary hypothyroidism    On NP thyroid Chart review suggests  normal TSH Plan to down titrate NP thyroid and switch to Levothyroxine if abnormal TSH -followed by Endocrinology        Genitourinary   Nephrolithiasis - Primary    Followed by urology -planned to have cystoscopy with stent placement tramadol as needed for flank pain UA negative for LE or nitrite      Other Visit Diagnoses     Dysuria       Relevant Orders   POCT URINALYSIS DIP (CLINITEK) (Completed)       No orders of the defined types were placed in this encounter.   Follow-up: Return in about 6 months (around 09/27/2022), or if symptoms worsen or fail to improve.    Lindell Spar, MD

## 2022-04-01 DIAGNOSIS — S46001D Unspecified injury of muscle(s) and tendon(s) of the rotator cuff of right shoulder, subsequent encounter: Secondary | ICD-10-CM | POA: Diagnosis not present

## 2022-04-01 DIAGNOSIS — M7541 Impingement syndrome of right shoulder: Secondary | ICD-10-CM | POA: Diagnosis not present

## 2022-04-01 DIAGNOSIS — M7551 Bursitis of right shoulder: Secondary | ICD-10-CM | POA: Diagnosis not present

## 2022-04-01 DIAGNOSIS — M19011 Primary osteoarthritis, right shoulder: Secondary | ICD-10-CM | POA: Diagnosis not present

## 2022-04-04 DIAGNOSIS — S46001D Unspecified injury of muscle(s) and tendon(s) of the rotator cuff of right shoulder, subsequent encounter: Secondary | ICD-10-CM | POA: Diagnosis not present

## 2022-04-04 DIAGNOSIS — M19011 Primary osteoarthritis, right shoulder: Secondary | ICD-10-CM | POA: Diagnosis not present

## 2022-04-04 DIAGNOSIS — M7541 Impingement syndrome of right shoulder: Secondary | ICD-10-CM | POA: Diagnosis not present

## 2022-04-04 DIAGNOSIS — M7551 Bursitis of right shoulder: Secondary | ICD-10-CM | POA: Diagnosis not present

## 2022-04-07 NOTE — Anesthesia Preprocedure Evaluation (Signed)
Anesthesia Evaluation  Patient identified by MRN, date of birth, ID band Patient awake    Reviewed: Allergy & Precautions, NPO status , Patient's Chart, lab work & pertinent test results  History of Anesthesia Complications (+) PONV and history of anesthetic complications  Airway Mallampati: III  TM Distance: >3 FB Neck ROM: Full   Comment: Neck pain Dental no notable dental hx. (+) Dental Advisory Given, Teeth Intact   Pulmonary asthma , former smoker,    Pulmonary exam normal breath sounds clear to auscultation       Cardiovascular hypertension, Pt. on medications and Pt. on home beta blockers Normal cardiovascular exam Rhythm:Regular Rate:Normal  Echo 2017 - Left ventricle: The cavity size was normal. Wall thickness was increased in a pattern of mild LVH. Systolic function was vigorous. The estimated ejection fraction was in the range of 65% to 70%. Wall motion was normal; there were no regional wall motion abnormalities. Doppler parameters are consistent with abnormal left ventricular relaxation (grade 1  diastolicdysfunction).  - Right atrium: Central venous pressure (est): 3 mm Hg.  - Atrial septum: No defect or patent foramen ovale was identified.  - Tricuspid valve: There was trivial regurgitation.  - Pulmonary arteries: PA peak pressure: 21 mm Hg (S).  - Pericardium, extracardiac: There was no pericardial effusion.   Impressions:  - Mild LVH with LVEF 65-70%. Grade 1 diastolic dysfunction with equivocal LV filling pressure. Trivial tricuspid regurgitation\with normal estimated PASP 21 mmHg.    Neuro/Psych  Headaches, TIAnegative psych ROS   GI/Hepatic Neg liver ROS, GERD  Medicated and Controlled,  Endo/Other  diabetes, Poorly Controlled, Type 2, Oral Hypoglycemic AgentsHypothyroidism   Renal/GU Renal disease     Musculoskeletal  (+) Arthritis , Osteoarthritis,    Abdominal   Peds   Hematology negative hematology ROS (+)   Anesthesia Other Findings   Reproductive/Obstetrics negative OB ROS                            Anesthesia Physical  Anesthesia Plan  ASA: 3  Anesthesia Plan: General   Post-op Pain Management: Minimal or no pain anticipated and Tylenol PO (pre-op)*   Induction: Intravenous  PONV Risk Score and Plan: 4 or greater and Ondansetron, Dexamethasone, Treatment may vary due to age or medical condition, Scopolamine patch - Pre-op, Midazolam, Propofol infusion and TIVA  Airway Management Planned: LMA  Additional Equipment: None  Intra-op Plan:   Post-operative Plan: Extubation in OR  Informed Consent: I have reviewed the patients History and Physical, chart, labs and discussed the procedure including the risks, benefits and alternatives for the proposed anesthesia with the patient or authorized representative who has indicated his/her understanding and acceptance.     Dental advisory given  Plan Discussed with: CRNA  Anesthesia Plan Comments:        Anesthesia Quick Evaluation

## 2022-04-08 ENCOUNTER — Ambulatory Visit (HOSPITAL_COMMUNITY)
Admission: RE | Admit: 2022-04-08 | Discharge: 2022-04-08 | Disposition: A | Discharge status: Home / Self Care | Payer: Medicare Other | Source: Ambulatory Visit | Attending: Urology | Admitting: Urology

## 2022-04-08 ENCOUNTER — Ambulatory Visit (HOSPITAL_BASED_OUTPATIENT_CLINIC_OR_DEPARTMENT_OTHER): Payer: Medicare Other | Admitting: Anesthesiology

## 2022-04-08 ENCOUNTER — Other Ambulatory Visit: Payer: Self-pay

## 2022-04-08 ENCOUNTER — Encounter (HOSPITAL_BASED_OUTPATIENT_CLINIC_OR_DEPARTMENT_OTHER)
Admission: RE | Disposition: A | Discharge status: Home / Self Care | Payer: Self-pay | Source: Ambulatory Visit | Attending: Urology

## 2022-04-08 ENCOUNTER — Encounter (HOSPITAL_BASED_OUTPATIENT_CLINIC_OR_DEPARTMENT_OTHER): Payer: Self-pay | Admitting: Urology

## 2022-04-08 DIAGNOSIS — Z79899 Other long term (current) drug therapy: Secondary | ICD-10-CM | POA: Diagnosis not present

## 2022-04-08 DIAGNOSIS — Z87891 Personal history of nicotine dependence: Secondary | ICD-10-CM | POA: Diagnosis not present

## 2022-04-08 DIAGNOSIS — K219 Gastro-esophageal reflux disease without esophagitis: Secondary | ICD-10-CM | POA: Diagnosis not present

## 2022-04-08 DIAGNOSIS — M1990 Unspecified osteoarthritis, unspecified site: Secondary | ICD-10-CM | POA: Insufficient documentation

## 2022-04-08 DIAGNOSIS — I10 Essential (primary) hypertension: Secondary | ICD-10-CM | POA: Diagnosis not present

## 2022-04-08 DIAGNOSIS — N202 Calculus of kidney with calculus of ureter: Secondary | ICD-10-CM | POA: Diagnosis not present

## 2022-04-08 DIAGNOSIS — E1165 Type 2 diabetes mellitus with hyperglycemia: Secondary | ICD-10-CM | POA: Insufficient documentation

## 2022-04-08 DIAGNOSIS — Z7984 Long term (current) use of oral hypoglycemic drugs: Secondary | ICD-10-CM | POA: Diagnosis not present

## 2022-04-08 DIAGNOSIS — N201 Calculus of ureter: Secondary | ICD-10-CM

## 2022-04-08 DIAGNOSIS — Z8673 Personal history of transient ischemic attack (TIA), and cerebral infarction without residual deficits: Secondary | ICD-10-CM | POA: Diagnosis not present

## 2022-04-08 DIAGNOSIS — E039 Hypothyroidism, unspecified: Secondary | ICD-10-CM | POA: Diagnosis not present

## 2022-04-08 DIAGNOSIS — E1169 Type 2 diabetes mellitus with other specified complication: Secondary | ICD-10-CM

## 2022-04-08 HISTORY — PX: CYSTOSCOPY WITH RETROGRADE PYELOGRAM, URETEROSCOPY AND STENT PLACEMENT: SHX5789

## 2022-04-08 HISTORY — PX: HOLMIUM LASER APPLICATION: SHX5852

## 2022-04-08 LAB — GLUCOSE, CAPILLARY: Glucose-Capillary: 156 mg/dL — ABNORMAL HIGH (ref 70–99)

## 2022-04-08 LAB — POCT I-STAT, CHEM 8
BUN: 14 mg/dL (ref 8–23)
Calcium, Ion: 1.19 mmol/L (ref 1.15–1.40)
Chloride: 101 mmol/L (ref 98–111)
Creatinine, Ser: 0.7 mg/dL (ref 0.44–1.00)
Glucose, Bld: 211 mg/dL — ABNORMAL HIGH (ref 70–99)
HCT: 42 % (ref 36.0–46.0)
Hemoglobin: 14.3 g/dL (ref 12.0–15.0)
Potassium: 4.2 mmol/L (ref 3.5–5.1)
Sodium: 138 mmol/L (ref 135–145)
TCO2: 22 mmol/L (ref 22–32)

## 2022-04-08 SURGERY — CYSTOURETEROSCOPY, WITH RETROGRADE PYELOGRAM AND STENT INSERTION
Anesthesia: General | Laterality: Left

## 2022-04-08 MED ORDER — PHENYLEPHRINE 80 MCG/ML (10ML) SYRINGE FOR IV PUSH (FOR BLOOD PRESSURE SUPPORT)
PREFILLED_SYRINGE | INTRAVENOUS | Status: AC
  Filled 2022-04-08: qty 10

## 2022-04-08 MED ORDER — 0.9 % SODIUM CHLORIDE (POUR BTL) OPTIME
TOPICAL | Status: DC | PRN
  Administered 2022-04-08: 1000 mL

## 2022-04-08 MED ORDER — ONDANSETRON HCL 4 MG/2ML IJ SOLN
INTRAMUSCULAR | Status: AC
  Filled 2022-04-08: qty 2

## 2022-04-08 MED ORDER — LACTATED RINGERS IV SOLN: INTRAVENOUS | Status: DC

## 2022-04-08 MED ORDER — SCOPOLAMINE 1 MG/3DAYS TD PT72
MEDICATED_PATCH | TRANSDERMAL | Status: AC
  Filled 2022-04-08: qty 1

## 2022-04-08 MED ORDER — FENTANYL CITRATE (PF) 100 MCG/2ML IJ SOLN: 25.0000 ug | INTRAMUSCULAR | Status: DC | PRN

## 2022-04-08 MED ORDER — FENTANYL CITRATE (PF) 100 MCG/2ML IJ SOLN
INTRAMUSCULAR | Status: DC | PRN
  Administered 2022-04-08 (×4): 25 ug via INTRAVENOUS

## 2022-04-08 MED ORDER — IOHEXOL 300 MG/ML  SOLN
INTRAMUSCULAR | Status: DC | PRN
  Administered 2022-04-08: 6 mL

## 2022-04-08 MED ORDER — PROPOFOL 500 MG/50ML IV EMUL
INTRAVENOUS | Status: DC | PRN
  Administered 2022-04-08: 50 ug/kg/min via INTRAVENOUS

## 2022-04-08 MED ORDER — INSULIN ASPART 100 UNIT/ML IJ SOLN
INTRAMUSCULAR | Status: AC
  Filled 2022-04-08: qty 1

## 2022-04-08 MED ORDER — PHENYLEPHRINE HCL (PRESSORS) 10 MG/ML IV SOLN
INTRAVENOUS | Status: DC | PRN
  Administered 2022-04-08 (×3): 80 ug via INTRAVENOUS

## 2022-04-08 MED ORDER — CEFAZOLIN SODIUM-DEXTROSE 2-4 GM/100ML-% IV SOLN
2.0000 g | INTRAVENOUS | Status: AC
  Administered 2022-04-08: 2 g via INTRAVENOUS

## 2022-04-08 MED ORDER — ONDANSETRON HCL 4 MG/2ML IJ SOLN
INTRAMUSCULAR | Status: DC | PRN
  Administered 2022-04-08: 4 mg via INTRAVENOUS

## 2022-04-08 MED ORDER — ACETAMINOPHEN 500 MG PO TABS
ORAL_TABLET | ORAL | Status: AC
  Filled 2022-04-08: qty 2

## 2022-04-08 MED ORDER — PROPOFOL 500 MG/50ML IV EMUL
INTRAVENOUS | Status: AC
  Filled 2022-04-08: qty 50

## 2022-04-08 MED ORDER — LIDOCAINE 2% (20 MG/ML) 5 ML SYRINGE
INTRAMUSCULAR | Status: DC | PRN
  Administered 2022-04-08: 100 mg via INTRAVENOUS

## 2022-04-08 MED ORDER — CEPHALEXIN 500 MG PO CAPS: 500.0000 mg | ORAL_CAPSULE | Freq: Two times a day (BID) | ORAL | 0 refills | Status: AC

## 2022-04-08 MED ORDER — TRAMADOL HCL 50 MG PO TABS: 50.0000 mg | ORAL_TABLET | Freq: Once | ORAL | Status: DC

## 2022-04-08 MED ORDER — LIDOCAINE HCL (PF) 2 % IJ SOLN
INTRAMUSCULAR | Status: AC
  Filled 2022-04-08: qty 5

## 2022-04-08 MED ORDER — AMISULPRIDE (ANTIEMETIC) 5 MG/2ML IV SOLN
5.0000 mg | Freq: Once | INTRAVENOUS | Status: AC | PRN
  Administered 2022-04-08: 5 mg via INTRAVENOUS

## 2022-04-08 MED ORDER — PROPOFOL 1000 MG/100ML IV EMUL
INTRAVENOUS | Status: AC
  Filled 2022-04-08: qty 100

## 2022-04-08 MED ORDER — INSULIN ASPART 100 UNIT/ML IJ SOLN
3.0000 [IU] | Freq: Once | INTRAMUSCULAR | Status: AC
Start: 2022-04-08 — End: 2022-04-08
  Administered 2022-04-08: 3 [IU] via SUBCUTANEOUS

## 2022-04-08 MED ORDER — ACETAMINOPHEN 500 MG PO TABS
1000.0000 mg | ORAL_TABLET | Freq: Once | ORAL | Status: AC
  Administered 2022-04-08: 1000 mg via ORAL

## 2022-04-08 MED ORDER — AMISULPRIDE (ANTIEMETIC) 5 MG/2ML IV SOLN
INTRAVENOUS | Status: AC
  Filled 2022-04-08: qty 2

## 2022-04-08 MED ORDER — SODIUM CHLORIDE 0.9 % IR SOLN
Status: DC | PRN
  Administered 2022-04-08 (×2): 3000 mL via INTRAVESICAL

## 2022-04-08 MED ORDER — CEFAZOLIN SODIUM-DEXTROSE 2-4 GM/100ML-% IV SOLN
INTRAVENOUS | Status: AC
  Filled 2022-04-08: qty 100

## 2022-04-08 MED ORDER — SCOPOLAMINE 1 MG/3DAYS TD PT72
1.0000 | MEDICATED_PATCH | TRANSDERMAL | Status: DC
  Administered 2022-04-08: 1.5 mg via TRANSDERMAL

## 2022-04-08 MED ORDER — PROPOFOL 10 MG/ML IV BOLUS
INTRAVENOUS | Status: DC | PRN
  Administered 2022-04-08: 100 mg via INTRAVENOUS
  Administered 2022-04-08: 50 mg via INTRAVENOUS

## 2022-04-08 MED ORDER — PROPOFOL 10 MG/ML IV BOLUS
INTRAVENOUS | Status: AC
  Filled 2022-04-08: qty 20

## 2022-04-08 MED ORDER — FENTANYL CITRATE (PF) 100 MCG/2ML IJ SOLN
INTRAMUSCULAR | Status: AC
  Filled 2022-04-08: qty 2

## 2022-04-08 SURGICAL SUPPLY — 25 items
BAG URO CATCHER STRL LF (MISCELLANEOUS) ×2 IMPLANT
BASKET STONE 1.7 NGAGE (UROLOGICAL SUPPLIES) ×1 IMPLANT
BASKET ZERO TIP NITINOL 2.4FR (BASKET) ×2 IMPLANT
BSKT STON RTRVL ZERO TP 2.4FR (BASKET) ×1
CATH URETL OPEN END 6FR 70 (CATHETERS) ×1 IMPLANT
EXTRACTOR STONE 1.7FRX115CM (UROLOGICAL SUPPLIES) ×2 IMPLANT
GOWN STRL REUS W/ TWL XL LVL3 (GOWN DISPOSABLE) ×1 IMPLANT
GOWN STRL REUS W/TWL XL LVL3 (GOWN DISPOSABLE) ×2
GUIDEWIRE ANG ZIPWIRE 038X150 (WIRE) IMPLANT
GUIDEWIRE STR DUAL SENSOR (WIRE) ×3 IMPLANT
IV NS 1000ML (IV SOLUTION) ×2
IV NS 1000ML BAXH (IV SOLUTION) ×1 IMPLANT
KIT TURNOVER CYSTO (KITS) ×2 IMPLANT
LASER FIB FLEXIVA PULSE ID 365 (Laser) ×1 IMPLANT
MANIFOLD NEPTUNE II (INSTRUMENTS) ×2 IMPLANT
PACK CYSTO (CUSTOM PROCEDURE TRAY) ×2 IMPLANT
SHEATH NAVIGATOR HD 11/13X28 (SHEATH) IMPLANT
SHEATH NAVIGATOR HD 11/13X36 (SHEATH) IMPLANT
SHEATH NAVIGATOR HD 12/14X46 (SHEATH) IMPLANT
SHEATH URETERAL 12FRX35CM (MISCELLANEOUS) ×1 IMPLANT
STENT URET 6FRX24 CONTOUR (STENTS) ×1 IMPLANT
TRACTIP FLEXIVA PULS ID 200XHI (Laser) IMPLANT
TRACTIP FLEXIVA PULSE ID 200 (Laser)
TUBING CONNECTING 10 (TUBING) ×2 IMPLANT
TUBING UROLOGY SET (TUBING) ×2 IMPLANT

## 2022-04-08 NOTE — Discharge Instructions (Addendum)
You may see some blood in the urine and may have some burning with urination for 48-72 hours. You also may notice that you have to urinate more frequently or urgently after your procedure which is normal.  You should call should you develop an inability urinate, fever > 101, persistent nausea and vomiting that prevents you from eating or drinking to stay hydrated.  If you have a stent, you will likely urinate more frequently and urgently until the stent is removed and you may experience some discomfort/pain in the lower abdomen and flank especially when urinating. You may take pain medication prescribed to you if needed for pain. You may also intermittently have blood in the urine until the stent is removed.   No acetaminophen/Tylenol until after 12:48pm today if needed for pain.     Post Anesthesia Home Care Instructions  Activity: Get plenty of rest for the remainder of the day. A responsible individual must stay with you for 24 hours following the procedure.  For the next 24 hours, DO NOT: -Drive a car -Paediatric nurse -Drink alcoholic beverages -Take any medication unless instructed by your physician -Make any legal decisions or sign important papers.  Meals: Start with liquid foods such as gelatin or soup. Progress to regular foods as tolerated. Avoid greasy, spicy, heavy foods. If nausea and/or vomiting occur, drink only clear liquids until the nausea and/or vomiting subsides. Call your physician if vomiting continues.  Special Instructions/Symptoms: Your throat may feel dry or sore from the anesthesia or the breathing tube placed in your throat during surgery. If this causes discomfort, gargle with warm salt water. The discomfort should disappear within 24 hours.  If you had a scopolamine patch placed behind your ear for the management of post- operative nausea and/or vomiting:  1. The medication in the patch is effective for 72 hours, after which it should be removed.  Wrap  patch in a tissue and discard in the trash. Wash hands thoroughly with soap and water. 2. You may remove the patch earlier than 72 hours if you experience unpleasant side effects which may include dry mouth, dizziness or visual disturbances. 3. Avoid touching the patch. Wash your hands with soap and water after contact with the patch.      CYSTOSCOPY HOME CARE INSTRUCTIONS  Activity: Rest for the remainder of the day.  Do not drive or operate equipment today.  You may resume normal activities in one to two days as instructed by your physician.   Meals: Drink plenty of liquids and eat light foods such as gelatin or soup this evening.  You may return to a normal meal plan tomorrow.  Return to Work: You may return to work in one to two days or as instructed by your physician.  Special Instructions / Symptoms: Call your physician if any of these symptoms occur:   -persistent or heavy bleeding  -bleeding which continues after first few urination  -large blood clots that are difficult to pass  -urine stream diminishes or stops completely  -fever equal to or higher than 101 degrees Farenheit.  -cloudy urine with a strong, foul odor  -severe pain  Females should always wipe from front to back after elimination.  You may feel some burning pain when you urinate.  This should disappear with time.  Applying moist heat to the lower abdomen or a hot tub bath may help relieve the pain.

## 2022-04-08 NOTE — Anesthesia Procedure Notes (Addendum)
Procedure Name: LMA Insertion Date/Time: 04/08/2022 8:43 AM  Performed by: Justice Rocher, CRNAPre-anesthesia Checklist: Patient identified, Emergency Drugs available, Suction available, Patient being monitored and Timeout performed Patient Re-evaluated:Patient Re-evaluated prior to induction Oxygen Delivery Method: Circle system utilized Preoxygenation: Pre-oxygenation with 100% oxygen Induction Type: IV induction Ventilation: Mask ventilation without difficulty LMA: LMA inserted LMA Size: 4.0 Number of attempts: 1 Airway Equipment and Method: Bite block Placement Confirmation: positive ETCO2, breath sounds checked- equal and bilateral and CO2 detector Tube secured with: Tape Dental Injury: Teeth and Oropharynx as per pre-operative assessment  Comments: LMA Repositioned by MDA for best placement   Facial sore near nose on face - noted preoperatively

## 2022-04-08 NOTE — H&P (Signed)
H&P  Chief Complaint: Left-sided kidney stones  History of Present Illness: [name] is a 71 y.o. year old female presenting at this time for ureteroscopic management of left ureteral and renal calculi.  She has a prior history of right percutaneous nephrolithotomy performed in October, 2022.  She is stone free on that side.  Recent follow-up revealed the patient to have left UPJ stone as well as left renal calculi.  She presents at this time for management of those symptomatic stones.  Past Medical History:  Diagnosis Date   Arthritis    Asthma    takes Singulair nightly   Diabetes mellitus    takes levemir daily and Metformin daily   Generalized headaches    GERD (gastroesophageal reflux disease)    takes Omeprazole daily   Herniated disc, cervical    History of kidney stones    Hyperlipidemia    takes Welchol daily   Hypertension    takes Enalapril dialy   Hypothyroidism    takes Synthroid daily   IBS (irritable bowel syndrome)    Joint pain    hands and neck   Nocturia    PONV (postoperative nausea and vomiting)    TIA (transient ischemic attack)    diagnosed by EEG but never knew she had it   Trigger finger    Weakness    Weakness    in both hands    Past Surgical History:  Procedure Laterality Date   ABDOMINAL HYSTERECTOMY  1980   ANTERIOR CERVICAL DECOMP/DISCECTOMY FUSION N/A 10/18/2013   Procedure: Cervical three-four, Cervical four-five, Cervical seven-Thoracic one anterior cervical decompression with fusion plating and bonegraft;  Surgeon: Hosie Spangle, MD;  Location: [facility];  Service: Neurosurgery;  Laterality: N/A;  Cervical three-four, Cervical four-five, Cervical seven-Thoracic one anterior cervical decompression with fusion plating and bonegraft   APPENDECTOMY     done with hysterectomy   BACK SURGERY  2007   lumbar fusion   carpel tunnel Bilateral    left first then right   cataract implants     CHOLECYSTECTOMY N/A 11/22/2021    Procedure: LAPAROSCOPIC CHOLECYSTECTOMY;  Surgeon: Rusty Aus, DO;  Location: [facility];  Service: General;  Laterality: N/A;   COLONOSCOPY     ESOPHAGOGASTRODUODENOSCOPY     hx of h pylori   EYE SURGERY  11/18/2008   right - cataracts   EYE SURGERY  06/01/2009   left - cataracts   herniated disc   10/2000, 08/31/2008   C5-C6 then C6-C7   IR URETERAL STENT RIGHT NEW ACCESS W/O SEP NEPHROSTOMY CATH  07/26/2021   knee anthroscopy bilateral Bilateral    KNEE ARTHROSCOPY     LITHOTRIPSY  11/12/2007   right side   NEPHROLITHOTOMY Right 07/26/2021   Procedure: NEPHROLITHOTOMY PERCUTANEOUS;  Surgeon: Franchot Gallo, MD;  Location: [facility];  Service: Urology;  Laterality: Right;   Parathyroid surgery     October 2013   PARATHYROIDECTOMY  07/02/2012   Procedure: PARATHYROIDECTOMY;  Surgeon: Earnstine Regal, MD;  Location: [facility];  Service: General;  Laterality: N/A;   TONSILLECTOMY     as a child    Home Medications:  Medications Prior to Admission  Medication Sig Dispense Refill   Accu-Chek Softclix Lancets lancets USE TO CHECK BLOOD SUGAR UP TO FOUR TIMES DAILY 100 each 2   acetaminophen (TYLENOL) 500 MG tablet Take 500 mg by mouth every 6 (six) hours as needed.     amLODipine (NORVASC) 10 MG tablet Take  1 tablet (10 mg total) by mouth daily. 90 tablet 0   blood glucose meter kit and supplies KIT Dispense based on patient and insurance preference. Use twice daily (before breakfast and before bed). 1 each 0   estradiol (ESTRACE) 1 MG tablet Take 1 tablet (1 mg total) by mouth daily. 90 tablet 1   glucose blood (ACCU-CHEK GUIDE) test strip Use as instructed to check blood glucose twice daily, once before breakfast and once before bedtime. 200 strip 1   metoprolol tartrate (LOPRESSOR) 25 MG tablet Take 1 tablet (25 mg total) by mouth 2 (two) times daily. 180 tablet 3   thyroid (NP THYROID) 60 MG tablet Take 1 tablet (60 mg total) by mouth daily before breakfast. 90 tablet 1   traMADol  (ULTRAM) 50 MG tablet Take 1 tablet (50 mg total) by mouth every 6 (six) hours as needed for moderate pain or severe pain. 20 tablet 0   Vitamin D, Ergocalciferol, (DRISDOL) 1.25 MG (50000 UNIT) CAPS capsule Take 1 capsule (50,000 Units total) by mouth every 7 (seven) days. 12 capsule 0    Allergies:  Allergies  Allergen Reactions   Advair Diskus [Fluticasone-Salmeterol] Anaphylaxis   Codeine Nausea And Vomiting   Hydrocodone Nausea And Vomiting   Levothyroxine Other (See Comments)    Caused lower leg swelling   Oxycodone Nausea And Vomiting   Tetracyclines & Related Itching    Internal    Family History  Problem Relation Age of Onset   Cancer Mother        pancreatic    Social History:  reports that she quit smoking about 9 years ago. Her smoking use included cigarettes. She has a 40.00 pack-year smoking history. She has never used smokeless tobacco. She reports that she does not drink alcohol and does not use drugs.  ROS: A complete review of systems was performed.  All systems are negative except for pertinent findings as noted.  Physical Exam:  Vital signs in last 24 hours: Temp:  [98.6 F (37 C)] 98.6 F (37 C) (07/10 0654) Pulse Rate:  [68] 68 (07/10 0654) Resp:  [17] 17 (07/10 0654) BP: (126)/(57) 126/57 (07/10 0654) SpO2:  [99 %] 99 % (07/10 0654) Weight:  [66.9 kg] 66.9 kg (07/10 0654) General:  Alert and oriented, No acute distress HEENT: Normocephalic, atraumatic Neck: No JVD or lymphadenopathy Cardiovascular: Regular rate  Lungs: Normal inspiratory/expiratory excursion Abdomen: Soft, nontender, nondistended, no abdominal masses Back: No CVA tenderness Extremities: No edema Neurologic: Grossly intact  I have reviewed prior pt notes  I have independently reviewed prior imaging   Impression/Assessment:  Left UPJ and renal calculi  Plan:  Cystoscopy, left retrograde, left ureteroscopy, holmium laser treatment of left-sided stone burden, double-J stent  placement  Lillette Boxer [name] 04/08/2022, 8:27 AM  Lillette Boxer. [name] MD

## 2022-04-08 NOTE — Transfer of Care (Signed)
Immediate Anesthesia Transfer of Care Note  Patient: [name]  Procedure(s) Performed: Procedure(s) (LRB): CYSTOSCOPY WITH RETROGRADE PYELOGRAM, URETEROSCOPY AND STENT PLACEMENT (Left) HOLMIUM LASER APPLICATION (Left)  Patient Location: PACU  Anesthesia Type: General  Level of Consciousness: awake, sedated, patient cooperative and responds to stimulation  Airway & Oxygen Therapy: Patient Spontanous Breathing and Patient connected to [place] 02   Post-op Assessment: Report given to PACU RN, Post -op Vital signs reviewed and stable and Patient moving all extremities  Post vital signs: Reviewed and stable  Complications: No apparent anesthesia complications

## 2022-04-08 NOTE — Op Note (Signed)
Preoperative diagnosis: Left UPJ stone, left upper and lower pole stones  Postoperative diagnosis: Left distal ureteral stone, left upper and lower pole stones  Procedure: Cystoscopy, left retrograde ureteropyelogram, use of fluoroscopy for under 1 hour, left ureteroscopy, holmium laser dusting of left upper pole stone, fragmentation of left lower pole stone, stone extraction of left lower pole and left lower ureteral stones placement of 6 French by 24 cm contour double-J stent without tether  Surgeon: Sharis Keeran  Anesthesia: General with LMA  Complications: None  Drains: None  Estimated blood loss: Less than 5 mL  Indications: 71 year old female with history of urolithiasis.  She underwent right percutaneous nephrolithotomy in October 2022 for large right renal stone burden.  We have been following her asymptomatic left stone burden.  Recent office visit revealed the patient to have stable left upper and lower pole calculi but she did have renal pelvic/UPJ stone on that side.  She was asymptomatic and wanted to follow this conservatively.  She shortly thereafter began having symptoms from this stone.  She presents at this time, several weeks later for persistent stone pain as well as management of her ureteral and calyceal calculi.  I discussed the procedure with the patient, risk, complications as well as alternatives and expected outcomes.  She understands these and desires to proceed.  Findings: Urothelium of the bladder was normal, ureteral orifices were normal in their location and configuration.  Retrograde study of the left ureter revealed a small filling defect distally with upper and lower pole filling defects in the left kidney consistent with her calculi.  There was no hydronephrosis.  Pyelocalyceal system was otherwise normal.  Description of procedure: The patient was properly identified and marked in the holding area.  She was taken to the operating room where general anesthetic was  administered with the LMA and antibiotics were administered intravenously.  She was placed in the dorsolithotomy position.  Genitalia and perineum were prepped, draped, proper timeout performed.  21 French panendoscope advanced into the bladder.  Circumferential inspection performed.  Using 6 [place] open-ended catheter left retrograde ureteropyelogram was performed.  This revealed a small filling defect distally with no proximal hydroureter.  Calyceal filling defects were seen.  At this point sensor tip guidewire was advanced up to the left upper pole calyx.  Following this the cystoscope was removed and sequential dilation of the ureter was performed first with the obturator and then the entire 11/13 French medium length ureteral access catheter.  This was quite easy.  I then advanced a flexible dual-lumen digital ureteroscope.  The pyelocalyceal system was systematically inspected.  Only 2 calculi were seen-the previously known 12 mm left upper pole calculus and the 8 mm lower pole calculus.  I first began treatment of the upper pole calculus using the 365 m fiber using laser energy set at a power of 1.0 J and frequency of 50 Hz.  This turned the stone into dust after adequate treatment.  No significant larger fragments were identified.  I felt that these could easily rinse along the eventually placed double-J stent.  Following this, the lower pole calculus was treated.  Because of its location, I decided to treat with just fragmentation.  The same laser energy settings were used.  Multiple small fragments resulted.  The fragments that were significant were extracted either with the 0 tip or the engage basket.  Multiple small dust like fragments were rinsed out of the calyx.  By the time treatment ended there were no fragments or  dust in the lower pole calyx.  I inspected the nearby calyces.  Small fragments were extracted.  The renal pelvis was free of fragments at this point.  Another systematic inspection of  all calyces was performed.  Most of the dust like fragments had been rinsed out through the ureteral access catheter.  No further significant stone burden was identified.  At this point, the ureteroscope was extracted slowly, behind the access catheter which was extracted at the same time.  The small distal stone was seen.  This was easily rinsed into the bladder without extraction.  No further stone burden was seen in the ureter.  The guidewire was left indwelling.  Cystoscope was removed to rinse the ureteral stone out of the bladder.  The guidewire was then backloaded through the scope, and I then placed a 24 cm x 6 French Contour double-J stent.  Tether had been removed.  After adequate positioning, the guidewire was removed and it was deployed with excellent proximal and distal curl seen using fluoroscopy and cystoscopy, respectively.  At this time the bladder was drained.  The scope was removed.  The procedure was then terminated.  The patient was awakened, taken to the PACU in stable condition having tolerated the procedure well.

## 2022-04-08 NOTE — Anesthesia Postprocedure Evaluation (Signed)
Anesthesia Post Note  Patient: [name]  Procedure(s) Performed: CYSTOSCOPY WITH RETROGRADE PYELOGRAM, URETEROSCOPY AND STENT PLACEMENT (Left) HOLMIUM LASER APPLICATION (Left)     Patient location during evaluation: PACU Anesthesia Type: General Level of consciousness: sedated and patient cooperative Pain management: pain level controlled Vital Signs Assessment: post-procedure vital signs reviewed and stable Respiratory status: spontaneous breathing Cardiovascular status: stable Anesthetic complications: no   No notable events documented.  Last Vitals:  Vitals:   04/08/22 1030 04/08/22 1045  BP: 127/62 132/60  Pulse: 68   Resp: 17   Temp:  (!) 36.4 C  SpO2: 99%     Last Pain:  Vitals:   04/08/22 1045  TempSrc:   PainSc: 0-No pain                 Nolon Nations

## 2022-04-09 ENCOUNTER — Encounter (HOSPITAL_BASED_OUTPATIENT_CLINIC_OR_DEPARTMENT_OTHER): Payer: Self-pay | Admitting: Urology

## 2022-04-23 DIAGNOSIS — N201 Calculus of ureter: Secondary | ICD-10-CM | POA: Diagnosis not present

## 2022-04-23 DIAGNOSIS — R8271 Bacteriuria: Secondary | ICD-10-CM | POA: Diagnosis not present

## 2022-05-06 ENCOUNTER — Ambulatory Visit: Payer: Medicare Other | Admitting: Internal Medicine

## 2022-05-06 ENCOUNTER — Other Ambulatory Visit: Payer: Self-pay | Admitting: Nurse Practitioner

## 2022-06-03 ENCOUNTER — Other Ambulatory Visit: Payer: Self-pay | Admitting: Internal Medicine

## 2022-06-03 DIAGNOSIS — I10 Essential (primary) hypertension: Secondary | ICD-10-CM

## 2022-06-05 ENCOUNTER — Ambulatory Visit: Payer: Medicare Other | Admitting: Nurse Practitioner

## 2022-06-18 DIAGNOSIS — N200 Calculus of kidney: Secondary | ICD-10-CM | POA: Diagnosis not present

## 2022-06-25 ENCOUNTER — Encounter: Payer: Self-pay | Admitting: Nurse Practitioner

## 2022-06-25 ENCOUNTER — Ambulatory Visit (INDEPENDENT_AMBULATORY_CARE_PROVIDER_SITE_OTHER): Payer: Medicare Other | Admitting: Nurse Practitioner

## 2022-06-25 VITALS — BP 127/68 | HR 75 | Ht 66.0 in | Wt 149.6 lb

## 2022-06-25 DIAGNOSIS — E559 Vitamin D deficiency, unspecified: Secondary | ICD-10-CM

## 2022-06-25 DIAGNOSIS — E119 Type 2 diabetes mellitus without complications: Secondary | ICD-10-CM | POA: Diagnosis not present

## 2022-06-25 DIAGNOSIS — E039 Hypothyroidism, unspecified: Secondary | ICD-10-CM

## 2022-06-25 LAB — POCT GLYCOSYLATED HEMOGLOBIN (HGB A1C): Hemoglobin A1C: 7.1 % — AB (ref 4.0–5.6)

## 2022-06-25 LAB — POCT UA - MICROALBUMIN: Creatinine, POC: 300 mg/dL

## 2022-06-25 MED ORDER — METFORMIN HCL ER 500 MG PO TB24: 500.0000 mg | ORAL_TABLET | Freq: Every day | ORAL | 1 refills | Status: DC

## 2022-06-25 MED ORDER — ACCU-CHEK SOFTCLIX LANCETS MISC: 2 refills | Status: DC

## 2022-06-25 NOTE — Patient Instructions (Signed)

## 2022-06-25 NOTE — Progress Notes (Signed)
Endocrinology Follow Up Note                                         06/25/2022, 12:44 PM  Subjective:   Subjective    [name] is a 71 y.o.-year-old female patient being seen in follow up after being seen in consultation for hypothyroidism, diabetes, hx parathyroid adenoma referred by Lindell Spar, MD.   Past Medical History:  Diagnosis Date   Arthritis    Asthma    takes Singulair nightly   Diabetes mellitus    takes levemir daily and Metformin daily   Generalized headaches    GERD (gastroesophageal reflux disease)    takes Omeprazole daily   Herniated disc, cervical    History of kidney stones    Hyperlipidemia    takes Welchol daily   Hypertension    takes Enalapril dialy   Hypothyroidism    takes Synthroid daily   IBS (irritable bowel syndrome)    Joint pain    hands and neck   Nocturia    PONV (postoperative nausea and vomiting)    TIA (transient ischemic attack)    diagnosed by EEG but never knew she had it   Trigger finger    Weakness    Weakness    in both hands    Past Surgical History:  Procedure Laterality Date   ABDOMINAL HYSTERECTOMY  1980   ANTERIOR CERVICAL DECOMP/DISCECTOMY FUSION N/A 10/18/2013   Procedure: Cervical three-four, Cervical four-five, Cervical seven-Thoracic one anterior cervical decompression with fusion plating and bonegraft;  Surgeon: Hosie Spangle, MD;  Location: [facility];  Service: Neurosurgery;  Laterality: N/A;  Cervical three-four, Cervical four-five, Cervical seven-Thoracic one anterior cervical decompression with fusion plating and bonegraft   APPENDECTOMY     done with hysterectomy   BACK SURGERY  2007   lumbar fusion   carpel tunnel Bilateral    left first then right   cataract implants     CHOLECYSTECTOMY N/A 11/22/2021   Procedure: LAPAROSCOPIC CHOLECYSTECTOMY;  Surgeon: Rusty Aus, DO;  Location: [facility];  Service: General;   Laterality: N/A;   COLONOSCOPY     CYSTOSCOPY WITH RETROGRADE PYELOGRAM, URETEROSCOPY AND STENT PLACEMENT Left 04/08/2022   Procedure: CYSTOSCOPY WITH RETROGRADE PYELOGRAM, URETEROSCOPY AND STENT PLACEMENT;  Surgeon: Franchot Gallo, MD;  Location: [facility];  Service: Urology;  Laterality: Left;   ESOPHAGOGASTRODUODENOSCOPY     hx of h pylori   EYE SURGERY  11/18/2008   right - cataracts   EYE SURGERY  06/01/2009   left - cataracts   herniated disc   10/2000, 08/31/2008   C5-C6 then C6-C7   HOLMIUM LASER APPLICATION Left 2/70/6237   Procedure: HOLMIUM LASER APPLICATION;  Surgeon: Franchot Gallo, MD;  Location: [facility];  Service: Urology;  Laterality: Left;   IR URETERAL STENT RIGHT NEW ACCESS W/O SEP NEPHROSTOMY CATH  07/26/2021   knee anthroscopy bilateral Bilateral    KNEE ARTHROSCOPY     LITHOTRIPSY  11/12/2007   right side   NEPHROLITHOTOMY Right 07/26/2021   Procedure: NEPHROLITHOTOMY PERCUTANEOUS;  Surgeon: Franchot Gallo, MD;  Location: [facility];  Service: Urology;  Laterality: Right;   Parathyroid surgery     2012-07-25   PARATHYROIDECTOMY  07/02/2012   Procedure: PARATHYROIDECTOMY;  Surgeon: Earnstine Regal, MD;  Location: [facility];  Service: General;  Laterality: N/A;   TONSILLECTOMY     as a child    Social History   Socioeconomic History   Marital status: Widowed    Spouse name: Not on file   Number of children: Not on file   Years of education: Not on file   Highest education level: Not on file  Occupational History   Not on file  Tobacco Use   Smoking status: Former    Packs/day: 1.00    Years: 40.00    Total pack years: 40.00    Types: Cigarettes    Quit date: 07/19/2012    Years since quitting: 9.9   Smokeless tobacco: Never   Tobacco comments:    quit smoking in 07-25-2012  Vaping Use   Vaping Use: Never used  Substance and Sexual Activity   Alcohol use: No    Alcohol/week: 0.0 standard drinks of alcohol   Drug  use: No   Sexual activity: Not Currently    Birth control/protection: Surgical  Other Topics Concern   Not on file  Social History Narrative   Widow,husband died 07/26/19 after 65 years marriage.Retired.   Social Determinants of Health   Financial Resource Strain: Low Risk  (03/05/2022)   Overall Financial Resource Strain (CARDIA)    Difficulty of Paying Living Expenses: Not hard at all  Food Insecurity: No Food Insecurity (03/05/2022)   Hunger Vital Sign    Worried About Running Out of Food in the Last Year: Never true    Ran Out of Food in the Last Year: Never true  Transportation Needs: No Transportation Needs (03/05/2022)   PRAPARE - Hydrologist (Medical): No    Lack of Transportation (Non-Medical): No  Physical Activity: Insufficiently Active (03/05/2022)   Exercise Vital Sign    Days of Exercise per Week: 4 days    Minutes of Exercise per Session: 30 min  Stress: No Stress Concern Present (03/05/2022)   [place]    Feeling of Stress : Not at all  Social Connections: Moderately Integrated (03/05/2022)   Social Connection and Isolation Panel [NHANES]    Frequency of Communication with Friends and Family: More than three times a week    Frequency of Social Gatherings with Friends and Family: More than three times a week    Attends Religious Services: More than 4 times per year    Active Member of [employer] or Organizations: Yes    Attends Archivist Meetings: More than 4 times per year    Marital Status: Widowed    Family History  Problem Relation Age of Onset   Cancer Mother        pancreatic    Outpatient Encounter Medications as of 06/25/2022  Medication Sig   acetaminophen (TYLENOL) 500 MG tablet Take 500 mg by mouth every 6 (six) hours as needed.   amLODipine (NORVASC) 10 MG tablet TAKE 1 TABLET(10 MG) BY MOUTH DAILY   blood glucose meter kit and supplies KIT Dispense  based on patient and insurance preference. Use twice daily (before breakfast and before bed).  estradiol (ESTRACE) 1 MG tablet Take 1 tablet (1 mg total) by mouth daily.   glucose blood (ACCU-CHEK GUIDE) test strip Use as instructed to check blood glucose twice daily, once before breakfast and once before bedtime.   metFORMIN (GLUCOPHAGE-XR) 500 MG 24 hr tablet Take 1 tablet (500 mg total) by mouth daily with breakfast.   metoprolol tartrate (LOPRESSOR) 25 MG tablet Take 1 tablet (25 mg total) by mouth 2 (two) times daily.   thyroid (NP THYROID) 60 MG tablet Take 1 tablet (60 mg total) by mouth daily before breakfast.   traMADol (ULTRAM) 50 MG tablet Take 1 tablet (50 mg total) by mouth every 6 (six) hours as needed for moderate pain or severe pain.   Vitamin D, Ergocalciferol, (DRISDOL) 1.25 MG (50000 UNIT) CAPS capsule TAKE 1 CAPSULE BY MOUTH EVERY 7 DAYS   [DISCONTINUED] Accu-Chek Softclix Lancets lancets USE TO CHECK BLOOD SUGAR UP TO FOUR TIMES DAILY   Accu-Chek Softclix Lancets lancets Use as instructed to monitor glucose once daily.   No facility-administered encounter medications on file as of 06/25/2022.    ALLERGIES: Allergies  Allergen Reactions   Advair Diskus [Fluticasone-Salmeterol] Anaphylaxis   Codeine Nausea And Vomiting   Hydrocodone Nausea And Vomiting   Levothyroxine Other (See Comments)    Caused lower leg swelling   Oxycodone Nausea And Vomiting   Tetracyclines & Related Itching    Internal   VACCINATION STATUS: Immunization History  Administered Date(s) Administered   Fluad Quad(high Dose 65+) 08/17/2019, 07/29/2021   Influenza Split 07/31/2013   Influenza, High Dose Seasonal PF 07/01/2017, 07/14/2020   Influenza-Unspecified 07/09/2017   Moderna Sars-Covid-2 Vaccination 11/24/2019, 12/22/2019, 08/30/2020, 02/28/2021   Tdap 07/12/2014     Diabetes She presents for her follow-up diabetic visit. She has type 2 diabetes mellitus. Her disease course has been  stable. There are no hypoglycemic associated symptoms. Pertinent negatives for hypoglycemia include no nervousness/anxiousness. Associated symptoms include fatigue. Pertinent negatives for diabetes include no weight loss. There are no hypoglycemic complications. Symptoms are stable. There are no diabetic complications. Risk factors for coronary artery disease include diabetes mellitus, stress, post-menopausal and hypertension. Current diabetic treatment includes diet. She is compliant with treatment some of the time. Her weight is fluctuating minimally. She is following a generally healthy diet. Meal planning includes avoidance of concentrated sweets. She has not had a previous visit with a dietitian. She participates in exercise intermittently. Her breakfast blood glucose range is generally 140-180 mg/dl. (She presents today with her meter and logs showing above target fasting glycemic profile.  Her POCT A1c today is 7.1%, unchanged from previous visit.  She works hard on diet but does indulge in foods she knows she shouldn't sometimes.  ) An ACE inhibitor/angiotensin II receptor blocker is not being taken. She does not see a podiatrist.Eye exam is current.  Thyroid Problem Presents for follow-up visit. Symptoms include depressed mood and fatigue. Patient reports no anxiety, constipation, weight gain or weight loss. The symptoms have been stable.    [name] is a patient with the above medical history. she was diagnosed with parathyroid adenoma in 2013 which required parathyroidectomy done by Dr. Armandina Gemma.  She is now experiencing similar symptoms that she had at that time and would like to investigate whether or not there could be a problem with one of her other parathyroid glands.  She thinks she remembers being started on thyroid hormone replacement after her parathyroid removal due to possible injury to the thyroid gland during  its removal.  She was a patient of Dr. Anastasio Champion who had started the  patient on NP thyroid likely as a result of accidental injury to the thyroid tissue during parathyroidectomy surgery   she was given various doses of NP thyroid over the years, currently on 60 mg. she reports compliance to this medication:  Taking it daily on empty stomach  with water, separated by >30 minutes before breakfast and other medications, and by at least 4 hours from calcium, iron, PPIs, multivitamins .  I reviewed patient's thyroid tests:  Lab Results  Component Value Date   TSH 2.300 01/23/2022   TSH 2.120 10/22/2021   TSH 2.930 07/03/2021   TSH 0.57 10/14/2019   TSH 0.15 (L) 06/29/2019   TSH 2.39 06/23/2018   TSH 2.66 04/16/2017   TSH 1.98 10/14/2016   TSH 4.37 04/04/2016   TSH 3.52 01/04/2016   FREET4 1.26 01/23/2022   FREET4 1.26 10/22/2021   FREET4 1.23 07/03/2021   FREET4 1.4 06/23/2018   FREET4 1.4 04/16/2017   FREET4 1.5 10/14/2016   FREET4 1.1 04/04/2016   FREET4 1.5 01/04/2016   FREET4 1.15 09/29/2015    Pt denies feeling nodules in neck, hoarseness,  SOB with lying down.  She does report intermittent trouble swallowing pills, something new.  she denies family history of thyroid disorders.  No family history of thyroid cancer.  No history of radiation therapy to head or neck.  No recent use of iodine supplements.  Denies use of Biotin containing supplements.  I reviewed her chart and she also has a history of parathyroid adenoma, controlled Diabetes without medications, GERD, HTN.   Review of systems  Constitutional: + Minimally fluctuating body weight,  current Body mass index is 24.15 kg/m. , no fatigue, no subjective hyperthermia, no subjective hypothermia Eyes: no blurry vision, no xerophthalmia ENT: no sore throat, no nodules palpated in throat, no dysphagia/odynophagia, no hoarseness Cardiovascular: no chest pain, no shortness of breath, no palpitations, no leg swelling Respiratory: no cough, no shortness of breath Gastrointestinal: no  nausea/vomiting/diarrhea Musculoskeletal: no muscle/joint aches Skin: no rashes, no hyperemia Neurological: no tremors, no numbness, no tingling, no dizziness Psychiatric: no depression, no anxiety   Objective:   Objective     BP 127/68 (BP Location: Left Arm, Patient Position: Sitting, Cuff Size: Large)   Pulse 75   Ht 5' 6"  (1.676 m)   Wt 149 lb 9.6 oz (67.9 kg)   BMI 24.15 kg/m  Wt Readings from Last 3 Encounters:  06/25/22 149 lb 9.6 oz (67.9 kg)  04/08/22 147 lb 6.4 oz (66.9 kg)  03/28/22 148 lb 12.8 oz (67.5 kg)    BP Readings from Last 3 Encounters:  06/25/22 127/68  04/08/22 (!) 136/56  03/28/22 (!) 138/58       Physical Exam- Limited  Constitutional:  Body mass index is 24.15 kg/m. , not in acute distress, normal state of mind Eyes:  EOMI, no exophthalmos Neck: Supple Cardiovascular: RRR, no murmurs, rubs, or gallops, no edema Respiratory: Adequate breathing efforts, no crackles, rales, rhonchi, or wheezing Musculoskeletal: no gross deformities, strength intact in all four extremities, no gross restriction of joint movements Skin:  no rashes, no hyperemia Neurological: no tremor with outstretched hands   CMP ( most recent) CMP     Component Value Date/Time   NA 138 04/08/2022 0713   NA 139 01/23/2022 0905   K 4.2 04/08/2022 0713   CL 101 04/08/2022 0713   CO2 18 (L) 01/23/2022 9030  GLUCOSE 211 (H) 04/08/2022 0713   BUN 14 04/08/2022 0713   BUN 14 01/23/2022 0905   CREATININE 0.70 04/08/2022 0713   CREATININE 0.81 12/25/2020 1034   CALCIUM 9.4 01/23/2022 0905   PROT 6.2 01/23/2022 0905   ALBUMIN 4.3 01/23/2022 0905   AST 27 01/23/2022 0905   ALT 29 01/23/2022 0905   ALKPHOS 74 01/23/2022 0905   BILITOT 0.4 01/23/2022 0905   GFRNONAA >60 11/20/2021 0916   GFRNONAA 73 12/25/2020 1034   GFRAA 85 12/25/2020 1034     Diabetic Labs (most recent): Lab Results  Component Value Date   HGBA1C 7.1 (A) 06/25/2022   HGBA1C 7.1 01/30/2022    HGBA1C 7.6 (H) 11/20/2021   MICROALBUR 109m/l 06/25/2022   MICROALBUR 30 07/10/2021   MICROALBUR 0.3 02/18/2018     Lipid Panel ( most recent) Lipid Panel     Component Value Date/Time   CHOL 163 01/23/2022 0905   TRIG 264 (H) 01/23/2022 0905   HDL 32 (L) 01/23/2022 0905   CHOLHDL 5.1 (H) 01/23/2022 0905   CHOLHDL 3.9 12/25/2020 1034   VLDL 33 (H) 10/14/2016 0721   LDLCALC 87 01/23/2022 0905   LDLCALC 85 12/25/2020 1034   LABVLDL 44 (H) 01/23/2022 0905       Lab Results  Component Value Date   TSH 2.300 01/23/2022   TSH 2.120 10/22/2021   TSH 2.930 07/03/2021   TSH 0.57 10/14/2019   TSH 0.15 (L) 06/29/2019   TSH 2.39 06/23/2018   TSH 2.66 04/16/2017   TSH 1.98 10/14/2016   TSH 4.37 04/04/2016   TSH 3.52 01/04/2016   FREET4 1.26 01/23/2022   FREET4 1.26 10/22/2021   FREET4 1.23 07/03/2021   FREET4 1.4 06/23/2018   FREET4 1.4 04/16/2017   FREET4 1.5 10/14/2016   FREET4 1.1 04/04/2016   FREET4 1.5 01/04/2016   FREET4 1.15 09/29/2015    [place] 06/[place] CLINICAL DATA:  History of parathyroid adenoma.   EXAM: THYROID ULTRASOUND   TECHNIQUE: Ultrasound examination of the thyroid gland and adjacent soft tissues was performed.   COMPARISON:  04/23/2011   FINDINGS: Parenchymal Echotexture: Mildly heterogenous   Isthmus: 0.1 cm, previously 0.2 cm   Right lobe: 4.6 x 1.9 x 1.4 cm, previously 4.1 x 1.4 x 1.4 cm   Left lobe: 3.1 x 1.0 x 1.1 cm, previously 3.9 x 1.5 x 1.3 cm   _________________________________________________________   Estimated total number of nodules >/= 1 cm: 0   Number of spongiform nodules >/=  2 cm not described below (TR1): 0   Number of mixed cystic and solid nodules >/= 1.5 cm not described below ([place]: 0   _________________________________________________________   No discrete nodules. Previously there was a nodule in the left thyroid lobe that is no longer present.   IMPRESSION: Thyroid tissue is mildly heterogeneous  without discrete thyroid nodules.     Electronically Signed   By: AMarkus DaftM.D.   On: 06/26/2021 11:17    Latest Reference Range & Units 10/14/19 13:36 01/17/20 10:55 07/03/21 09:18 10/22/21 08:55 01/23/22 09:05  TSH 0.450 - 4.500 uIU/mL 0.57  2.930 2.120 2.300  Triiodothyronine,Free,Serum 2.0 - 4.4 pg/mL 3.4 5.1 (H) 4.4    T4,Free(Direct) 0.82 - 1.77 ng/dL   1.23 1.26 1.26  Thyroperoxidase Ab SerPl-aCnc 0 - 34 IU/mL   <8    Thyroglobulin Antibody 0.0 - 0.9 IU/mL   <1.0    (H): Data is abnormally high   Assessment & Plan:   ASSESSMENT / PLAN:  1. History of parathyroid adenoma- s/p parathyroidectomy  -Her recent calcium levels were normal at 9.4.  She does have low vitamin D currently on replacement.  2. Hypothyroidism- unspecified   Patient with long-standing hypothyroidism, on NP thyroid 60 mg.  She historically does not tolerate Levothyroxine (causes significant ankle swelling).  - There are no recent TFTs to review.  She is advised to continue NP thyroid 60 mg po daily before breakfast.    - We discussed about correct intake of levothyroxine, at fasting, with water, separated by at least 30 minutes from breakfast, and separated by more than 4 hours from calcium, iron, multivitamins, acid reflux medications (PPIs). -Patient is made aware of the fact that thyroid hormone replacement is needed for life, dose to be adjusted by periodic monitoring of thyroid function tests.  3. Uncontrolled Diabetes   She presents today with her meter and logs showing above target fasting glycemic profile.  Her POCT A1c today is 7.1%, unchanged from previous visit.  She works hard on diet but does indulge in foods she knows she shouldn't sometimes.    I discussed and initiated low dose Metformin 500 mg ER daily with breakfast to assist her in getting back on track.  - Nutritional counseling repeated at each appointment due to patients tendency to fall back in to old habits.  - The patient  admits there is a room for improvement in their diet and drink choices. -  Suggestion is made for the patient to avoid simple carbohydrates from their diet including Cakes, Sweet Desserts / Pastries, Ice Cream, Soda (diet and regular), Sweet Tea, Candies, Chips, Cookies, Sweet Pastries, Store Bought Juices, Alcohol in Excess of 1-2 drinks a day, Artificial Sweeteners, Coffee Creamer, and "Sugar-free" Products. This will help patient to have stable blood glucose profile and potentially avoid unintended weight gain.   - I encouraged the patient to switch to unprocessed or minimally processed complex starch and increased protein intake (animal or plant source), fruits, and vegetables.   - Patient is advised to stick to a routine mealtimes to eat 3 meals a day and avoid unnecessary snacks (to snack only to correct hypoglycemia).  4. Vitamin D deficiency Her most recent vitamin D level was 18.7 on 07/03/21.  She reportedly does not tolerate OTC vitamin D, causes upset stomach and diarrhea.  I did initiate replenishment with Ergocalciferol 50000 units PO weekly which she is tolerating well.  Will recheck vitamin D prior to next visit.      I spent 45 minutes in the care of the patient today including review of labs from [facility], Lipids, Thyroid Function, Hematology (current and previous including abstractions from other facilities); face-to-face time discussing  her blood glucose readings/logs, discussing hypoglycemia and hyperglycemia episodes and symptoms, medications doses, her options of short and long term treatment based on the latest standards of care / guidelines;  discussion about incorporating lifestyle medicine;  and documenting the encounter. Risk reduction counseling performed per USPSTF guidelines to reduce obesity and cardiovascular risk factors.     Please refer to Patient Instructions for Blood Glucose Monitoring and Insulin/Medications Dosing Guide"  in media tab for additional information.  Please  also refer to " Patient Self Inventory" in the Media  tab for reviewed elements of pertinent patient history.  [name] participated in the discussions, expressed understanding, and voiced agreement with the above plans.  All questions were answered to her satisfaction. she is encouraged to contact clinic should she have any questions or  concerns prior to her return visit.   FOLLOW UP PLAN:  Return in about 4 months (around 10/25/2022) for Diabetes F/U with A1c in office, Previsit labs, Bring meter and logs, Thyroid follow up.   [name], [facility] [address] [place], [place] [address] Phone: [phone] Fax: [phone]  06/25/2022, 12:44 PM

## 2022-08-09 ENCOUNTER — Other Ambulatory Visit: Payer: Self-pay | Admitting: Nurse Practitioner

## 2022-08-09 DIAGNOSIS — Z23 Encounter for immunization: Secondary | ICD-10-CM | POA: Diagnosis not present

## 2022-08-30 ENCOUNTER — Other Ambulatory Visit: Payer: Self-pay | Admitting: Family Medicine

## 2022-08-30 DIAGNOSIS — I10 Essential (primary) hypertension: Secondary | ICD-10-CM

## 2022-09-06 DIAGNOSIS — N200 Calculus of kidney: Secondary | ICD-10-CM | POA: Diagnosis not present

## 2022-09-24 ENCOUNTER — Other Ambulatory Visit: Payer: Self-pay | Admitting: Internal Medicine

## 2022-09-24 ENCOUNTER — Other Ambulatory Visit: Payer: Self-pay | Admitting: Nurse Practitioner

## 2022-09-24 DIAGNOSIS — N951 Menopausal and female climacteric states: Secondary | ICD-10-CM

## 2022-09-27 ENCOUNTER — Encounter: Payer: Self-pay | Admitting: Internal Medicine

## 2022-09-27 ENCOUNTER — Ambulatory Visit (INDEPENDENT_AMBULATORY_CARE_PROVIDER_SITE_OTHER): Payer: Medicare Other | Admitting: Internal Medicine

## 2022-09-27 VITALS — BP 122/67 | HR 79 | Ht 66.0 in | Wt 145.8 lb

## 2022-09-27 DIAGNOSIS — E782 Mixed hyperlipidemia: Secondary | ICD-10-CM

## 2022-09-27 DIAGNOSIS — I10 Essential (primary) hypertension: Secondary | ICD-10-CM

## 2022-09-27 DIAGNOSIS — Z800 Family history of malignant neoplasm of digestive organs: Secondary | ICD-10-CM | POA: Diagnosis not present

## 2022-09-27 DIAGNOSIS — E039 Hypothyroidism, unspecified: Secondary | ICD-10-CM

## 2022-09-27 DIAGNOSIS — E559 Vitamin D deficiency, unspecified: Secondary | ICD-10-CM | POA: Diagnosis not present

## 2022-09-27 DIAGNOSIS — Z1211 Encounter for screening for malignant neoplasm of colon: Secondary | ICD-10-CM | POA: Diagnosis not present

## 2022-09-27 DIAGNOSIS — E1169 Type 2 diabetes mellitus with other specified complication: Secondary | ICD-10-CM | POA: Diagnosis not present

## 2022-09-27 DIAGNOSIS — F17211 Nicotine dependence, cigarettes, in remission: Secondary | ICD-10-CM

## 2022-09-27 NOTE — Assessment & Plan Note (Signed)
Will check lipid profile later

## 2022-09-27 NOTE — Progress Notes (Signed)
Established Patient Office Visit  Subjective:  Patient ID: [name], female    DOB: [birth year]  Age: 71 y.o. MRN: [id number]  CC:  Chief Complaint  Patient presents with   Follow-up    Patient did have recent oral surgery done.She is also been fighting a cold that has cough and congestion for a week now, been treating with OTC items    HPI [name] is a 71 y.o. female with past medical history of HTN, GERD, type II DM, hypothyroidism, HLD and parathyroid adenoma who presents for f/u of her chronic medical conditions.  HTN: BP is well-controlled. Takes medications regularly. Patient denies headache, dizziness, chest pain, dyspnea or palpitations.  She is still on NP thyroid for hypothyroidism.  Followed by endocrinology.  She complains of mild nasal congestion, postnasal drip and cough for the last 1 week.  Denies any fever or chills.  Of note, she is currently on amoxicillin after recent dental procedure.  Her sister was recently diagnosed with colon cancer at the age of 66.  Patient had negative Cologuard in 06/22.  She currently denies any melena or hematochezia.  Nephrolithiasis: She had get cystoscopy with stent placement in 07/23. She denies any gross hematuria currently.  Denies any fever, chills, nausea, vomiting or urinary hesitancy.  Past Medical History:  Diagnosis Date   Arthritis    Asthma    takes Singulair nightly   Diabetes mellitus    takes levemir daily and Metformin daily   Generalized headaches    GERD (gastroesophageal reflux disease)    takes Omeprazole daily   Herniated disc, cervical    History of kidney stones    Hyperlipidemia    takes Welchol daily   Hypertension    takes Enalapril dialy   Hypothyroidism    takes Synthroid daily   IBS (irritable bowel syndrome)    Joint pain    hands and neck   Nocturia    PONV (postoperative nausea and vomiting)    TIA (transient ischemic attack)    diagnosed by EEG but never knew she had it    Trigger finger    Weakness    Weakness    in both hands    Past Surgical History:  Procedure Laterality Date   ABDOMINAL HYSTERECTOMY  1980   ANTERIOR CERVICAL DECOMP/DISCECTOMY FUSION N/A 10/18/2013   Procedure: Cervical three-four, Cervical four-five, Cervical seven-Thoracic one anterior cervical decompression with fusion plating and bonegraft;  Surgeon: Hosie Spangle, MD;  Location: [facility];  Service: Neurosurgery;  Laterality: N/A;  Cervical three-four, Cervical four-five, Cervical seven-Thoracic one anterior cervical decompression with fusion plating and bonegraft   APPENDECTOMY     done with hysterectomy   BACK SURGERY  2007   lumbar fusion   carpel tunnel Bilateral    left first then right   cataract implants     CHOLECYSTECTOMY N/A 11/22/2021   Procedure: LAPAROSCOPIC CHOLECYSTECTOMY;  Surgeon: Rusty Aus, DO;  Location: [facility];  Service: General;  Laterality: N/A;   COLONOSCOPY     CYSTOSCOPY WITH RETROGRADE PYELOGRAM, URETEROSCOPY AND STENT PLACEMENT Left 04/08/2022   Procedure: CYSTOSCOPY WITH RETROGRADE PYELOGRAM, URETEROSCOPY AND STENT PLACEMENT;  Surgeon: Franchot Gallo, MD;  Location: [facility];  Service: Urology;  Laterality: Left;   ESOPHAGOGASTRODUODENOSCOPY     hx of h pylori   EYE SURGERY  11/18/2008   right - cataracts   EYE SURGERY  06/01/2009   left - cataracts   herniated disc  10/2000, 08/31/2008   C5-C6 then C6-C7   HOLMIUM LASER APPLICATION Left 1/65/7903   Procedure: HOLMIUM LASER APPLICATION;  Surgeon: Franchot Gallo, MD;  Location: [facility];  Service: Urology;  Laterality: Left;   IR URETERAL STENT RIGHT NEW ACCESS W/O SEP NEPHROSTOMY CATH  07/26/2021   knee anthroscopy bilateral Bilateral    KNEE ARTHROSCOPY     LITHOTRIPSY  11/12/2007   right side   NEPHROLITHOTOMY Right 07/26/2021   Procedure: NEPHROLITHOTOMY PERCUTANEOUS;  Surgeon: Franchot Gallo, MD;  Location: [facility];  Service:  Urology;  Laterality: Right;   Parathyroid surgery     2012-07-21   PARATHYROIDECTOMY  07/02/2012   Procedure: PARATHYROIDECTOMY;  Surgeon: Earnstine Regal, MD;  Location: [facility];  Service: General;  Laterality: N/A;   TONSILLECTOMY     as a child    Family History  Problem Relation Age of Onset   Cancer Mother        pancreatic    Social History   Socioeconomic History   Marital status: Widowed    Spouse name: Not on file   Number of children: Not on file   Years of education: Not on file   Highest education level: Not on file  Occupational History   Not on file  Tobacco Use   Smoking status: Former    Packs/day: 1.00    Years: 40.00    Total pack years: 40.00    Types: Cigarettes    Quit date: 07/19/2012    Years since quitting: 10.1   Smokeless tobacco: Never   Tobacco comments:    quit smoking in 2012-07-21  Vaping Use   Vaping Use: Never used  Substance and Sexual Activity   Alcohol use: No    Alcohol/week: 0.0 standard drinks of alcohol   Drug use: No   Sexual activity: Not Currently    Birth control/protection: Surgical  Other Topics Concern   Not on file  Social History Narrative   Widow,husband died 07/22/19 after 76 years marriage.Retired.   Social Determinants of Health   Financial Resource Strain: Low Risk  (03/05/2022)   Overall Financial Resource Strain (CARDIA)    Difficulty of Paying Living Expenses: Not hard at all  Food Insecurity: No Food Insecurity (03/05/2022)   Hunger Vital Sign    Worried About Running Out of Food in the Last Year: Never true    Ran Out of Food in the Last Year: Never true  Transportation Needs: No Transportation Needs (03/05/2022)   PRAPARE - Hydrologist (Medical): No    Lack of Transportation (Non-Medical): No  Physical Activity: Insufficiently Active (03/05/2022)   Exercise Vital Sign    Days of Exercise per Week: 4 days    Minutes of Exercise per Session: 30 min  Stress: No Stress  Concern Present (03/05/2022)   [place]    Feeling of Stress : Not at all  Social Connections: Moderately Integrated (03/05/2022)   Social Connection and Isolation Panel [NHANES]    Frequency of Communication with Friends and Family: More than three times a week    Frequency of Social Gatherings with Friends and Family: More than three times a week    Attends Religious Services: More than 4 times per year    Active Member of [employer] or Organizations: Yes    Attends Archivist Meetings: More than 4 times per year    Marital Status: Widowed  Intimate Partner Violence: Not At Risk (03/05/2022)   Humiliation, Afraid, Rape, and Kick questionnaire    Fear of Current or Ex-Partner: No    Emotionally Abused: No    Physically Abused: No    Sexually Abused: No    Outpatient Medications Prior to Visit  Medication Sig Dispense Refill   Accu-Chek Softclix Lancets lancets Use as instructed to monitor glucose once daily. 100 each 2   acetaminophen (TYLENOL) 500 MG tablet Take 500 mg by mouth every 6 (six) hours as needed.     amLODipine (NORVASC) 10 MG tablet TAKE 1 TABLET(10 MG) BY MOUTH DAILY 90 tablet 0   blood glucose meter kit and supplies KIT Dispense based on patient and insurance preference. Use twice daily (before breakfast and before bed). 1 each 0   estradiol (ESTRACE) 1 MG tablet TAKE 1 TABLET(1 MG) BY MOUTH DAILY 90 tablet 1   glucose blood (ACCU-CHEK GUIDE) test strip Use as instructed to check blood glucose twice daily, once before breakfast and once before bedtime. 200 strip 1   metFORMIN (GLUCOPHAGE-XR) 500 MG 24 hr tablet Take 1 tablet (500 mg total) by mouth daily with breakfast. 90 tablet 1   metoprolol tartrate (LOPRESSOR) 25 MG tablet Take 1 tablet (25 mg total) by mouth 2 (two) times daily. 180 tablet 3   thyroid (NP THYROID) 60 MG tablet TAKE 1 TABLET BY MOUTH EVERY DAY BEFORE BREAKFAST 90 tablet 0   traMADol  (ULTRAM) 50 MG tablet Take 1 tablet (50 mg total) by mouth every 6 (six) hours as needed for moderate pain or severe pain. 20 tablet 0   Vitamin D, Ergocalciferol, (DRISDOL) 1.25 MG (50000 UNIT) CAPS capsule TAKE 1 CAPSULE BY MOUTH EVERY 7 DAYS 12 capsule 0   No facility-administered medications prior to visit.    Allergies  Allergen Reactions   Advair Diskus [Fluticasone-Salmeterol] Anaphylaxis   Codeine Nausea And Vomiting   Hydrocodone Nausea And Vomiting   Levothyroxine Other (See Comments)    Caused lower leg swelling   Oxycodone Nausea And Vomiting   Tetracyclines & Related Itching    Internal    ROS Review of Systems  Constitutional:  Negative for chills and fever.  HENT:  Negative for congestion, sinus pressure, sinus pain and sore throat.   Eyes:  Negative for pain and discharge.  Respiratory:  Negative for cough and shortness of breath.   Cardiovascular:  Negative for chest pain and palpitations.  Gastrointestinal:  Negative for diarrhea, nausea and vomiting.  Endocrine: Negative for polydipsia and polyuria.  Genitourinary:  Negative for dysuria and hematuria.  Musculoskeletal:  Positive for arthralgias and myalgias. Negative for neck pain and neck stiffness.  Skin:  Negative for rash.  Neurological:  Negative for dizziness and weakness.  Psychiatric/Behavioral:  Negative for agitation and behavioral problems.       Objective:    Physical Exam Vitals reviewed.  Constitutional:      General: She is not in acute distress.    Appearance: She is not diaphoretic.  HENT:     Head: Normocephalic and atraumatic.     Nose: Nose normal.     Mouth/Throat:     Mouth: Mucous membranes are moist.  Eyes:     General: No scleral icterus.    Extraocular Movements: Extraocular movements intact.  Neck:     Comments: Scar noted, site C/D/I Cardiovascular:     Rate and Rhythm: Normal rate and regular rhythm.     Pulses: Normal pulses.  Heart sounds: Normal heart sounds.  No murmur heard. Pulmonary:     Breath sounds: Normal breath sounds. No wheezing or rales.  Musculoskeletal:     Cervical back: Neck supple. No tenderness.     Right lower leg: No edema.     Left lower leg: No edema.  Skin:    General: Skin is warm.     Findings: No rash.  Neurological:     General: No focal deficit present.     Mental Status: She is alert and oriented to person, place, and time.  Psychiatric:        Mood and Affect: Mood normal.        Behavior: Behavior normal.     BP 122/67 (BP Location: Right Arm, Patient Position: Sitting, Cuff Size: Normal)   Pulse 79   Ht _0  (1.676 m)   Wt 145 lb 12.8 oz (66.1 kg)   SpO2 95%   BMI 23.53 kg/m  Wt Readings from Last 3 Encounters:  09/27/22 145 lb 12.8 oz (66.1 kg)  06/25/22 149 lb 9.6 oz (67.9 kg)  04/08/22 147 lb 6.4 oz (66.9 kg)    Lab Results  Component Value Date   TSH 2.300 01/23/2022   Lab Results  Component Value Date   WBC 5.9 07/26/2021   HGB 14.3 04/08/2022   HCT 42.0 04/08/2022   MCV 88.2 07/26/2021   PLT 196 07/26/2021   Lab Results  Component Value Date   NA 138 04/08/2022   K 4.2 04/08/2022   CO2 18 (L) 01/23/2022   GLUCOSE 211 (H) 04/08/2022   BUN 14 04/08/2022   CREATININE 0.70 04/08/2022   BILITOT 0.4 01/23/2022   ALKPHOS 74 01/23/2022   AST 27 01/23/2022   ALT 29 01/23/2022   PROT 6.2 01/23/2022   ALBUMIN 4.3 01/23/2022   CALCIUM 9.4 01/23/2022   ANIONGAP 7 11/20/2021   EGFR 67 01/23/2022   Lab Results  Component Value Date   CHOL 163 01/23/2022   Lab Results  Component Value Date   HDL 32 (L) 01/23/2022   Lab Results  Component Value Date   LDLCALC 87 01/23/2022   Lab Results  Component Value Date   TRIG 264 (H) 01/23/2022   Lab Results  Component Value Date   CHOLHDL 5.1 (H) 01/23/2022   Lab Results  Component Value Date   HGBA1C 7.1 (A) 06/25/2022      Assessment & Plan:   Problem List Items Addressed This Visit       Cardiovascular and  Mediastinum   Essential hypertension - Primary    BP Readings from Last 1 Encounters:  09/27/22 122/67  Overall remains well-controlled with Amlodipine and Metoprolol Counseled for compliance with the medications Advised DASH diet and moderate exercise/walking, at least 150 mins/week        Endocrine   Type 2 diabetes mellitus with other specified complication ([place])    Lab Results  Component Value Date   HGBA1C 7.1 (A) 06/25/2022  Associated with HLD and HTN Was diet controlled, recently worse - on Metformin 500 mg QD Advised to follow strict low-carb diet for now Advised to follow up with Ophthalmology for diabetic eye exam      Relevant Orders   CMP14+EGFR   Hemoglobin A1c   Primary hypothyroidism    On NP thyroid Chart review suggests normal TSH Plan to down titrate NP thyroid and switch to Levothyroxine if abnormal TSH -followed by Endocrinology      Relevant Orders  TSH + free T4     Other   Mixed hyperlipidemia    Will check lipid profile later      Relevant Orders   Lipid panel   Family hx of colon cancer    Her sister was diagnosed with colon cancer Referred to GI for screening colonoscopy      Relevant Orders   Ambulatory referral to Gastroenterology   Cigarette nicotine dependence in remission    Quit smoking in 2013, has > 20-pack-year smoking history Ordered low-dose CT chest after discussing with the patient.      Relevant Orders   CT CHEST LUNG CANCER SCREENING LOW DOSE WO CONTRAST   Other Visit Diagnoses     Vitamin D deficiency       Relevant Orders   Vitamin D (25 hydroxy)   Colon cancer screening       Relevant Orders   Ambulatory referral to Gastroenterology       No orders of the defined types were placed in this encounter.   Follow-up: Return in about 6 months (around 03/29/2023) for HTN.    Lindell Spar, MD

## 2022-09-27 NOTE — Assessment & Plan Note (Signed)
BP Readings from Last 1 Encounters:  09/27/22 122/67   Overall remains well-controlled with Amlodipine and Metoprolol Counseled for compliance with the medications Advised DASH diet and moderate exercise/walking, at least 150 mins/week

## 2022-09-27 NOTE — Assessment & Plan Note (Signed)
Quit smoking in 2013, has > 20-pack-year smoking history Ordered low-dose CT chest after discussing with the patient.

## 2022-09-27 NOTE — Assessment & Plan Note (Signed)
On NP thyroid Chart review suggests normal TSH Plan to down titrate NP thyroid and switch to Levothyroxine if abnormal TSH -followed by Endocrinology

## 2022-09-27 NOTE — Assessment & Plan Note (Signed)
Her sister was diagnosed with colon cancer Referred to GI for screening colonoscopy

## 2022-09-27 NOTE — Assessment & Plan Note (Addendum)
Lab Results  Component Value Date   HGBA1C 7.1 (A) 06/25/2022   Associated with HLD and HTN Was diet controlled, recently worse - on Metformin 500 mg QD Advised to follow strict low-carb diet for now Advised to follow up with Ophthalmology for diabetic eye exam

## 2022-09-27 NOTE — Patient Instructions (Signed)
Please continue to take medications as prescribed. ? ?Please continue to follow low carb diet and perform moderate exercise/walking at least 150 mins/week. ?

## 2022-09-30 DIAGNOSIS — J84112 Idiopathic pulmonary fibrosis: Secondary | ICD-10-CM

## 2022-09-30 HISTORY — DX: Idiopathic pulmonary fibrosis: J84.112

## 2022-10-03 ENCOUNTER — Telehealth (INDEPENDENT_AMBULATORY_CARE_PROVIDER_SITE_OTHER): Payer: Medicare Other | Admitting: Family Medicine

## 2022-10-03 ENCOUNTER — Encounter: Payer: Self-pay | Admitting: Family Medicine

## 2022-10-03 DIAGNOSIS — J209 Acute bronchitis, unspecified: Secondary | ICD-10-CM

## 2022-10-03 MED ORDER — BENZONATATE 100 MG PO CAPS: 100.0000 mg | ORAL_CAPSULE | Freq: Two times a day (BID) | ORAL | 0 refills | Status: DC | PRN

## 2022-10-03 MED ORDER — ALBUTEROL SULFATE HFA 108 (90 BASE) MCG/ACT IN AERS: 2.0000 | INHALATION_SPRAY | Freq: Four times a day (QID) | RESPIRATORY_TRACT | 0 refills | Status: DC | PRN

## 2022-10-03 MED ORDER — PREDNISONE 10 MG (21) PO TBPK: ORAL_TABLET | ORAL | 0 refills | Status: DC

## 2022-10-03 NOTE — Progress Notes (Signed)
Virtual Visit via Telephone Note   This visit type was conducted via telephone. This format is felt to be most appropriate for this patient at this time.  The patient did not have access to video technology/had technical difficulties with video requiring transitioning to audio format only (telephone).  All issues noted in this document were discussed and addressed.  No physical exam could be performed with this format.  Evaluation Performed:  Follow-up visit  Date:  10/03/2022   ID:  [name], DOB 09/06/1951, MRN [id number]  Patient Location: Home Provider Location: Clinic  Participants: Patient Location of Patient: Home Location of Provider: Clinic Consent was obtain for visit to be over via telehealth. I verified that I am speaking with the correct person using two identifiers.  PCP:  Lindell Spar, MD   Chief Complaint: Cough and wheezing  History of Present Illness:    [name] is a 72 y.o. female with complaints of cough and congestion for 2 weeks that has worsened since 09/30/2021. She reports postnasal drip and excessive cough, especially at nighttime, with wheezing. She reports completing amoxicillin after a recent dental procedure. She denies fever and chills. She reports minimal relief with over-the-counter regimen.  The patient does have symptoms concerning for COVID-19 infection (fever, chills, cough, or new shortness of breath).   Past Medical, Surgical, Social History, Allergies, and Medications have been Reviewed.  Past Medical History:  Diagnosis Date   Arthritis    Asthma    takes Singulair nightly   Diabetes mellitus    takes levemir daily and Metformin daily   Generalized headaches    GERD (gastroesophageal reflux disease)    takes Omeprazole daily   Herniated disc, cervical    History of kidney stones    Hyperlipidemia    takes Welchol daily   Hypertension    takes Enalapril dialy   Hypothyroidism    takes Synthroid daily   IBS  (irritable bowel syndrome)    Joint pain    hands and neck   Nocturia    PONV (postoperative nausea and vomiting)    TIA (transient ischemic attack)    diagnosed by EEG but never knew she had it   Trigger finger    Weakness    Weakness    in both hands   Past Surgical History:  Procedure Laterality Date   ABDOMINAL HYSTERECTOMY  1980   ANTERIOR CERVICAL DECOMP/DISCECTOMY FUSION N/A 10/18/2013   Procedure: Cervical three-four, Cervical four-five, Cervical seven-Thoracic one anterior cervical decompression with fusion plating and bonegraft;  Surgeon: Hosie Spangle, MD;  Location: [facility];  Service: Neurosurgery;  Laterality: N/A;  Cervical three-four, Cervical four-five, Cervical seven-Thoracic one anterior cervical decompression with fusion plating and bonegraft   APPENDECTOMY     done with hysterectomy   BACK SURGERY  2007   lumbar fusion   carpel tunnel Bilateral    left first then right   cataract implants     CHOLECYSTECTOMY N/A 11/22/2021   Procedure: LAPAROSCOPIC CHOLECYSTECTOMY;  Surgeon: Rusty Aus, DO;  Location: [facility];  Service: General;  Laterality: N/A;   COLONOSCOPY     CYSTOSCOPY WITH RETROGRADE PYELOGRAM, URETEROSCOPY AND STENT PLACEMENT Left 04/08/2022   Procedure: CYSTOSCOPY WITH RETROGRADE PYELOGRAM, URETEROSCOPY AND STENT PLACEMENT;  Surgeon: Franchot Gallo, MD;  Location: [facility];  Service: Urology;  Laterality: Left;   ESOPHAGOGASTRODUODENOSCOPY     hx of h pylori   EYE SURGERY  11/18/2008  right - cataracts   EYE SURGERY  06/01/2009   left - cataracts   herniated disc   10/2000, 08/31/2008   C5-C6 then C6-C7   HOLMIUM LASER APPLICATION Left 05/31/5175   Procedure: HOLMIUM LASER APPLICATION;  Surgeon: Franchot Gallo, MD;  Location: [facility];  Service: Urology;  Laterality: Left;   IR URETERAL STENT RIGHT NEW ACCESS W/O SEP NEPHROSTOMY CATH  07/26/2021   knee anthroscopy bilateral Bilateral    KNEE  ARTHROSCOPY     LITHOTRIPSY  11/12/2007   right side   NEPHROLITHOTOMY Right 07/26/2021   Procedure: NEPHROLITHOTOMY PERCUTANEOUS;  Surgeon: Franchot Gallo, MD;  Location: [facility];  Service: Urology;  Laterality: Right;   Parathyroid surgery     October 2013   PARATHYROIDECTOMY  07/02/2012   Procedure: PARATHYROIDECTOMY;  Surgeon: Earnstine Regal, MD;  Location: [facility];  Service: General;  Laterality: N/A;   TONSILLECTOMY     as a child     Current Meds  Medication Sig   Accu-Chek Softclix Lancets lancets Use as instructed to monitor glucose once daily.   acetaminophen (TYLENOL) 500 MG tablet Take 500 mg by mouth every 6 (six) hours as needed.   albuterol (VENTOLIN HFA) 108 (90 Base) MCG/ACT inhaler Inhale 2 puffs into the lungs every 6 (six) hours as needed for wheezing or shortness of breath.   amLODipine (NORVASC) 10 MG tablet TAKE 1 TABLET(10 MG) BY MOUTH DAILY   benzonatate (TESSALON) 100 MG capsule Take 1 capsule (100 mg total) by mouth 2 (two) times daily as needed for cough.   blood glucose meter kit and supplies KIT Dispense based on patient and insurance preference. Use twice daily (before breakfast and before bed).   estradiol (ESTRACE) 1 MG tablet TAKE 1 TABLET(1 MG) BY MOUTH DAILY   glucose blood (ACCU-CHEK GUIDE) test strip Use as instructed to check blood glucose twice daily, once before breakfast and once before bedtime.   metFORMIN (GLUCOPHAGE-XR) 500 MG 24 hr tablet Take 1 tablet (500 mg total) by mouth daily with breakfast.   metoprolol tartrate (LOPRESSOR) 25 MG tablet Take 1 tablet (25 mg total) by mouth 2 (two) times daily.   predniSONE (STERAPRED UNI-PAK 21 TAB) 10 MG (21) TBPK tablet Please follow the dosage instructions provided.   thyroid (NP THYROID) 60 MG tablet TAKE 1 TABLET BY MOUTH EVERY DAY BEFORE BREAKFAST   traMADol (ULTRAM) 50 MG tablet Take 1 tablet (50 mg total) by mouth every 6 (six) hours as needed for moderate pain or severe pain.   Vitamin D,  Ergocalciferol, (DRISDOL) 1.25 MG (50000 UNIT) CAPS capsule TAKE 1 CAPSULE BY MOUTH EVERY 7 DAYS     Allergies:   Advair diskus [fluticasone-salmeterol], Codeine, Hydrocodone, Levothyroxine, Oxycodone, and Tetracyclines & related   ROS:   Please see the history of present illness.     All other systems reviewed and are negative.   Labs/Other Tests and Data Reviewed:    Recent Labs: 01/23/2022: ALT 29; TSH 2.300 04/08/2022: BUN 14; Creatinine, Ser 0.70; Hemoglobin 14.3; Potassium 4.2; Sodium 138   Recent Lipid Panel Lab Results  Component Value Date/Time   CHOL 163 01/23/2022 09:05 AM   TRIG 264 (H) 01/23/2022 09:05 AM   HDL 32 (L) 01/23/2022 09:05 AM   CHOLHDL 5.1 (H) 01/23/2022 09:05 AM   CHOLHDL 3.9 12/25/2020 10:34 AM   LDLCALC 87 01/23/2022 09:05 AM   LDLCALC 85 12/25/2020 10:34 AM    Wt Readings from Last 3 Encounters:  09/27/22 145  lb 12.8 oz (66.1 kg)  06/25/22 149 lb 9.6 oz (67.9 kg)  04/08/22 147 lb 6.4 oz (66.9 kg)     Objective:    Vital Signs:  There were no vitals taken for this visit.     ASSESSMENT & PLAN:   Acute bronchitis Will treat with steroid taper Tessalon Perles ordered for cough Albuterol inhaler ordered for shortness of breath Take medication as prescribed. Increase fluids and allow for plenty of rest. Recommend Tylenol as needed for pain, fever, or general discomfort. Warm salt water gargles 3-4 times daily to help with throat pain or discomfort. Recommend using a humidifier at bedtime during sleep to help with cough and nasal congestion. Follow-up if your symptoms do not improve    Time:   Today, I have spent 12 minutes reviewing the chart, including problem list, medications, and with the patient with telehealth technology discussing the above problems.   Medication Adjustments/Labs and Tests Ordered: Current medicines are reviewed at length with the patient today.  Concerns regarding medicines are outlined above.   Tests  Ordered: No orders of the defined types were placed in this encounter.   Medication Changes: Meds ordered this encounter  Medications   predniSONE (STERAPRED UNI-PAK 21 TAB) 10 MG (21) TBPK tablet    Sig: Please follow the dosage instructions provided.    Dispense:  21 tablet    Refill:  0   benzonatate (TESSALON) 100 MG capsule    Sig: Take 1 capsule (100 mg total) by mouth 2 (two) times daily as needed for cough.    Dispense:  20 capsule    Refill:  0   albuterol (VENTOLIN HFA) 108 (90 Base) MCG/ACT inhaler    Sig: Inhale 2 puffs into the lungs every 6 (six) hours as needed for wheezing or shortness of breath.    Dispense:  8 g    Refill:  0     Note: This dictation was prepared with Dragon dictation along with smaller phrase technology. Similar sounding words can be transcribed inadequately or may not be corrected upon review. Any transcriptional errors that result from this process are unintentional.      Disposition:  Follow up  Signed, Alvira Monday, FNP  10/03/2022 2:57 PM     [facility]

## 2022-10-04 ENCOUNTER — Other Ambulatory Visit: Payer: Self-pay | Admitting: Family Medicine

## 2022-10-04 ENCOUNTER — Telehealth: Payer: Self-pay | Admitting: Internal Medicine

## 2022-10-04 DIAGNOSIS — R059 Cough, unspecified: Secondary | ICD-10-CM

## 2022-10-04 MED ORDER — GUAIFENESIN 100 MG/5ML PO LIQD: 5.0000 mL | ORAL | 0 refills | Status: DC | PRN

## 2022-10-04 NOTE — Telephone Encounter (Signed)
Spoke with patient.

## 2022-10-04 NOTE — Telephone Encounter (Signed)
Patient called in regard to visit on 1/4 w. Peter Congo  Patient states that benzonatate (TESSALON) 100 MG capsule  Is not helping. Wants a call back in regard, wants to see if can either take more of  the benzonatate (TESSALON) 100 MG capsule  Or if something else can be sent in

## 2022-10-04 NOTE — Telephone Encounter (Signed)
Kindly inform the patient a prescription for Robitussin has been ordered

## 2022-10-09 ENCOUNTER — Telehealth: Payer: Self-pay | Admitting: Internal Medicine

## 2022-10-09 NOTE — Telephone Encounter (Signed)
Diabetic statin drug recommended. If pt cannot take, intolerance reason needs to be added to the allergy list

## 2022-10-10 ENCOUNTER — Encounter (INDEPENDENT_AMBULATORY_CARE_PROVIDER_SITE_OTHER): Payer: Self-pay | Admitting: *Deleted

## 2022-10-10 NOTE — Telephone Encounter (Signed)
No voice mail.

## 2022-10-14 ENCOUNTER — Telehealth: Payer: Self-pay | Admitting: Internal Medicine

## 2022-10-14 NOTE — Telephone Encounter (Signed)
Patient called said was seen by Peter Congo for cough has finished up the meds that was prescribe, now has a rattle in chest and deep cough.  What does patient need to do continue OTC. Patient concern about the rattle. Please give patient a call at [phone].

## 2022-10-14 NOTE — Telephone Encounter (Signed)
Pt called back wanting to speak to nurse when available

## 2022-10-15 NOTE — Telephone Encounter (Signed)
Spoke to patient

## 2022-10-21 DIAGNOSIS — E782 Mixed hyperlipidemia: Secondary | ICD-10-CM | POA: Diagnosis not present

## 2022-10-21 DIAGNOSIS — E559 Vitamin D deficiency, unspecified: Secondary | ICD-10-CM | POA: Diagnosis not present

## 2022-10-21 DIAGNOSIS — E039 Hypothyroidism, unspecified: Secondary | ICD-10-CM | POA: Diagnosis not present

## 2022-10-21 DIAGNOSIS — E1169 Type 2 diabetes mellitus with other specified complication: Secondary | ICD-10-CM | POA: Diagnosis not present

## 2022-10-22 ENCOUNTER — Ambulatory Visit (HOSPITAL_COMMUNITY)
Admission: RE | Admit: 2022-10-22 | Discharge: 2022-10-22 | Disposition: A | Discharge status: Home / Self Care | Payer: Medicare Other | Source: Ambulatory Visit | Attending: Internal Medicine | Admitting: Internal Medicine

## 2022-10-22 ENCOUNTER — Other Ambulatory Visit: Payer: Self-pay | Admitting: Internal Medicine

## 2022-10-22 DIAGNOSIS — F17211 Nicotine dependence, cigarettes, in remission: Secondary | ICD-10-CM | POA: Insufficient documentation

## 2022-10-22 DIAGNOSIS — E782 Mixed hyperlipidemia: Secondary | ICD-10-CM

## 2022-10-22 DIAGNOSIS — Z87891 Personal history of nicotine dependence: Secondary | ICD-10-CM | POA: Diagnosis not present

## 2022-10-22 LAB — CMP14+EGFR
ALT: 46 IU/L — ABNORMAL HIGH (ref 0–32)
AST: 32 IU/L (ref 0–40)
Albumin/Globulin Ratio: 2.5 — ABNORMAL HIGH (ref 1.2–2.2)
Albumin: 4.2 g/dL (ref 3.8–4.8)
Alkaline Phosphatase: 74 IU/L (ref 44–121)
BUN/Creatinine Ratio: 15 (ref 12–28)
BUN: 12 mg/dL (ref 8–27)
Bilirubin Total: 0.4 mg/dL (ref 0.0–1.2)
CO2: 21 mmol/L (ref 20–29)
Calcium: 9.1 mg/dL (ref 8.7–10.3)
Chloride: 102 mmol/L (ref 96–106)
Creatinine, Ser: 0.81 mg/dL (ref 0.57–1.00)
Globulin, Total: 1.7 g/dL (ref 1.5–4.5)
Glucose: 190 mg/dL — ABNORMAL HIGH (ref 70–99)
Potassium: 4.4 mmol/L (ref 3.5–5.2)
Sodium: 139 mmol/L (ref 134–144)
Total Protein: 5.9 g/dL — ABNORMAL LOW (ref 6.0–8.5)
eGFR: 77 mL/min/{1.73_m2} (ref >59)

## 2022-10-22 LAB — LIPID PANEL
Chol/HDL Ratio: 4.7 ratio — ABNORMAL HIGH (ref 0.0–4.4)
Cholesterol, Total: 174 mg/dL (ref 100–199)
HDL: 37 mg/dL — ABNORMAL LOW (ref >39)
LDL Chol Calc (NIH): 105 mg/dL — ABNORMAL HIGH (ref 0–99)
Triglycerides: 182 mg/dL — ABNORMAL HIGH (ref 0–149)
VLDL Cholesterol Cal: 32 mg/dL (ref 5–40)

## 2022-10-22 LAB — VITAMIN D 25 HYDROXY (VIT D DEFICIENCY, FRACTURES): Vit D, 25-Hydroxy: 51.2 ng/mL (ref 30.0–100.0)

## 2022-10-22 LAB — HEMOGLOBIN A1C
Est. average glucose Bld gHb Est-mCnc: 160 mg/dL
Hgb A1c MFr Bld: 7.2 % — ABNORMAL HIGH (ref 4.8–5.6)

## 2022-10-22 LAB — TSH+FREE T4
Free T4: 1.13 ng/dL (ref 0.82–1.77)
TSH: 2.92 u[IU]/mL (ref 0.450–4.500)

## 2022-10-22 MED ORDER — EZETIMIBE 10 MG PO TABS: 10.0000 mg | ORAL_TABLET | Freq: Every day | ORAL | 3 refills | Status: DC

## 2022-10-24 ENCOUNTER — Other Ambulatory Visit: Payer: Self-pay | Admitting: Internal Medicine

## 2022-10-24 ENCOUNTER — Encounter: Payer: Self-pay | Admitting: Internal Medicine

## 2022-10-24 DIAGNOSIS — J439 Emphysema, unspecified: Secondary | ICD-10-CM | POA: Insufficient documentation

## 2022-10-24 DIAGNOSIS — J849 Interstitial pulmonary disease, unspecified: Secondary | ICD-10-CM

## 2022-10-24 DIAGNOSIS — I700 Atherosclerosis of aorta: Secondary | ICD-10-CM

## 2022-10-28 ENCOUNTER — Encounter: Payer: Self-pay | Admitting: Nurse Practitioner

## 2022-10-28 ENCOUNTER — Ambulatory Visit (INDEPENDENT_AMBULATORY_CARE_PROVIDER_SITE_OTHER): Payer: Medicare Other | Admitting: Nurse Practitioner

## 2022-10-28 VITALS — BP 126/66 | HR 102 | Ht 66.0 in | Wt 146.0 lb

## 2022-10-28 DIAGNOSIS — E559 Vitamin D deficiency, unspecified: Secondary | ICD-10-CM | POA: Diagnosis not present

## 2022-10-28 DIAGNOSIS — E119 Type 2 diabetes mellitus without complications: Secondary | ICD-10-CM

## 2022-10-28 DIAGNOSIS — E039 Hypothyroidism, unspecified: Secondary | ICD-10-CM | POA: Diagnosis not present

## 2022-10-28 DIAGNOSIS — D351 Benign neoplasm of parathyroid gland: Secondary | ICD-10-CM | POA: Diagnosis not present

## 2022-10-28 MED ORDER — VITAMIN D (ERGOCALCIFEROL) 1.25 MG (50000 UNIT) PO CAPS
50000.0000 [IU] | ORAL_CAPSULE | ORAL | 1 refills | Status: DC
Start: 2022-10-28 — End: 2023-04-29

## 2022-10-28 MED ORDER — METFORMIN HCL ER 500 MG PO TB24: 500.0000 mg | ORAL_TABLET | Freq: Every day | ORAL | 3 refills | Status: DC

## 2022-10-28 MED ORDER — THYROID 60 MG PO TABS: ORAL_TABLET | ORAL | 3 refills | Status: DC

## 2022-10-28 NOTE — Patient Instructions (Signed)

## 2022-10-28 NOTE — Progress Notes (Signed)
Endocrinology Follow Up Note                                         10/28/2022, 11:04 AM  Subjective:   Subjective    [name] is a 72 y.o.-year-old female patient being seen in follow up after being seen in consultation for hypothyroidism, diabetes, hx parathyroid adenoma referred by Lindell Spar, MD.   Past Medical History:  Diagnosis Date   Arthritis    Asthma    takes Singulair nightly   Diabetes mellitus    takes levemir daily and Metformin daily   Generalized headaches    GERD (gastroesophageal reflux disease)    takes Omeprazole daily   Herniated disc, cervical    History of kidney stones    Hyperlipidemia    takes Welchol daily   Hypertension    takes Enalapril dialy   Hypothyroidism    takes Synthroid daily   IBS (irritable bowel syndrome)    Joint pain    hands and neck   Nocturia    PONV (postoperative nausea and vomiting)    TIA (transient ischemic attack)    diagnosed by EEG but never knew she had it   Trigger finger    Weakness    Weakness    in both hands    Past Surgical History:  Procedure Laterality Date   ABDOMINAL HYSTERECTOMY  1980   ANTERIOR CERVICAL DECOMP/DISCECTOMY FUSION N/A 10/18/2013   Procedure: Cervical three-four, Cervical four-five, Cervical seven-Thoracic one anterior cervical decompression with fusion plating and bonegraft;  Surgeon: Hosie Spangle, MD;  Location: [facility];  Service: Neurosurgery;  Laterality: N/A;  Cervical three-four, Cervical four-five, Cervical seven-Thoracic one anterior cervical decompression with fusion plating and bonegraft   APPENDECTOMY     done with hysterectomy   BACK SURGERY  2007   lumbar fusion   carpel tunnel Bilateral    left first then right   cataract implants     CHOLECYSTECTOMY N/A 11/22/2021   Procedure: LAPAROSCOPIC CHOLECYSTECTOMY;  Surgeon: Rusty Aus, DO;  Location: [facility];  Service: General;   Laterality: N/A;   COLONOSCOPY     CYSTOSCOPY WITH RETROGRADE PYELOGRAM, URETEROSCOPY AND STENT PLACEMENT Left 04/08/2022   Procedure: CYSTOSCOPY WITH RETROGRADE PYELOGRAM, URETEROSCOPY AND STENT PLACEMENT;  Surgeon: Franchot Gallo, MD;  Location: [facility];  Service: Urology;  Laterality: Left;   ESOPHAGOGASTRODUODENOSCOPY     hx of h pylori   EYE SURGERY  11/18/2008   right - cataracts   EYE SURGERY  06/01/2009   left - cataracts   herniated disc   10/2000, 08/31/2008   C5-C6 then C6-C7   HOLMIUM LASER APPLICATION Left 0/09/7492   Procedure: HOLMIUM LASER APPLICATION;  Surgeon: Franchot Gallo, MD;  Location: [facility];  Service: Urology;  Laterality: Left;   IR URETERAL STENT RIGHT NEW ACCESS W/O SEP NEPHROSTOMY CATH  07/26/2021   knee anthroscopy bilateral Bilateral    KNEE ARTHROSCOPY     LITHOTRIPSY  11/12/2007   right side   NEPHROLITHOTOMY Right 07/26/2021   Procedure: NEPHROLITHOTOMY PERCUTANEOUS;  Surgeon: Franchot Gallo, MD;  Location: [facility];  Service: Urology;  Laterality: Right;   Parathyroid surgery     02-Aug-2012   PARATHYROIDECTOMY  07/02/2012   Procedure: PARATHYROIDECTOMY;  Surgeon: Earnstine Regal, MD;  Location: [facility];  Service: General;  Laterality: N/A;   TONSILLECTOMY     as a child    Social History   Socioeconomic History   Marital status: Widowed    Spouse name: Not on file   Number of children: Not on file   Years of education: Not on file   Highest education level: Not on file  Occupational History   Not on file  Tobacco Use   Smoking status: Former    Packs/day: 1.00    Years: 40.00    Total pack years: 40.00    Types: Cigarettes    Quit date: 07/19/2012    Years since quitting: 10.2   Smokeless tobacco: Never   Tobacco comments:    quit smoking in 2012-08-02  Vaping Use   Vaping Use: Never used  Substance and Sexual Activity   Alcohol use: No    Alcohol/week: 0.0 standard drinks of alcohol    Drug use: No   Sexual activity: Not Currently    Birth control/protection: Surgical  Other Topics Concern   Not on file  Social History Narrative   Widow,husband died 08/03/19 after 56 years marriage.Retired.   Social Determinants of Health   Financial Resource Strain: Low Risk  (03/05/2022)   Overall Financial Resource Strain (CARDIA)    Difficulty of Paying Living Expenses: Not hard at all  Food Insecurity: No Food Insecurity (03/05/2022)   Hunger Vital Sign    Worried About Running Out of Food in the Last Year: Never true    Ran Out of Food in the Last Year: Never true  Transportation Needs: No Transportation Needs (03/05/2022)   PRAPARE - Hydrologist (Medical): No    Lack of Transportation (Non-Medical): No  Physical Activity: Insufficiently Active (03/05/2022)   Exercise Vital Sign    Days of Exercise per Week: 4 days    Minutes of Exercise per Session: 30 min  Stress: No Stress Concern Present (03/05/2022)   [place]    Feeling of Stress : Not at all  Social Connections: Moderately Integrated (03/05/2022)   Social Connection and Isolation Panel [NHANES]    Frequency of Communication with Friends and Family: More than three times a week    Frequency of Social Gatherings with Friends and Family: More than three times a week    Attends Religious Services: More than 4 times per year    Active Member of [employer] or Organizations: Yes    Attends Archivist Meetings: More than 4 times per year    Marital Status: Widowed    Family History  Problem Relation Age of Onset   Cancer Mother        pancreatic    Outpatient Encounter Medications as of 10/28/2022  Medication Sig   Accu-Chek Softclix Lancets lancets Use as instructed to monitor glucose once daily.   acetaminophen (TYLENOL) 500 MG tablet Take 500 mg by mouth every 6 (six) hours as needed.   amLODipine (NORVASC) 10 MG  tablet TAKE 1 TABLET(10 MG) BY MOUTH DAILY   blood glucose meter kit and supplies KIT Dispense  based on patient and insurance preference. Use twice daily (before breakfast and before bed).   estradiol (ESTRACE) 1 MG tablet TAKE 1 TABLET(1 MG) BY MOUTH DAILY   ezetimibe (ZETIA) 10 MG tablet Take 1 tablet (10 mg total) by mouth daily.   glucose blood (ACCU-CHEK GUIDE) test strip Use as instructed to check blood glucose twice daily, once before breakfast and once before bedtime.   metoprolol tartrate (LOPRESSOR) 25 MG tablet Take 1 tablet (25 mg total) by mouth 2 (two) times daily.   [DISCONTINUED] metFORMIN (GLUCOPHAGE-XR) 500 MG 24 hr tablet Take 1 tablet (500 mg total) by mouth daily with breakfast.   [DISCONTINUED] thyroid (NP THYROID) 60 MG tablet TAKE 1 TABLET BY MOUTH EVERY DAY BEFORE BREAKFAST   [DISCONTINUED] Vitamin D, Ergocalciferol, (DRISDOL) 1.25 MG (50000 UNIT) CAPS capsule TAKE 1 CAPSULE BY MOUTH EVERY 7 DAYS   metFORMIN (GLUCOPHAGE-XR) 500 MG 24 hr tablet Take 1 tablet (500 mg total) by mouth daily with breakfast.   thyroid (NP THYROID) 60 MG tablet TAKE 1 TABLET BY MOUTH EVERY DAY BEFORE BREAKFAST   Vitamin D, Ergocalciferol, (DRISDOL) 1.25 MG (50000 UNIT) CAPS capsule Take 1 capsule (50,000 Units total) by mouth every 7 (seven) days.   [DISCONTINUED] albuterol (VENTOLIN HFA) 108 (90 Base) MCG/ACT inhaler Inhale 2 puffs into the lungs every 6 (six) hours as needed for wheezing or shortness of breath.   [DISCONTINUED] benzonatate (TESSALON) 100 MG capsule Take 1 capsule (100 mg total) by mouth 2 (two) times daily as needed for cough.   [DISCONTINUED] guaiFENesin (ROBITUSSIN) 100 MG/5ML liquid Take 5 mLs by mouth every 4 (four) hours as needed for cough or to loosen phlegm.   [DISCONTINUED] predniSONE (STERAPRED UNI-PAK 21 TAB) 10 MG (21) TBPK tablet Please follow the dosage instructions provided.   [DISCONTINUED] traMADol (ULTRAM) 50 MG tablet Take 1 tablet (50 mg total) by mouth every 6  (six) hours as needed for moderate pain or severe pain.   No facility-administered encounter medications on file as of 10/28/2022.    ALLERGIES: Allergies  Allergen Reactions   Advair Diskus [Fluticasone-Salmeterol] Anaphylaxis   Codeine Nausea And Vomiting   Hydrocodone Nausea And Vomiting   Levothyroxine Other (See Comments)    Caused lower leg swelling   Oxycodone Nausea And Vomiting   Statins    Tetracyclines & Related Itching    Internal   VACCINATION STATUS: Immunization History  Administered Date(s) Administered   Fluad Quad(high Dose 65+) 08/17/2019, 07/29/2021   Influenza Split 07/31/2013   Influenza, High Dose Seasonal PF 07/01/2017, 07/14/2020   Influenza-Unspecified 07/09/2017, 08/09/2022   Moderna Sars-Covid-2 Vaccination 11/24/2019, 12/22/2019, 08/30/2020, 02/28/2021   Tdap 07/12/2014     Diabetes She presents for her follow-up diabetic visit. She has type 2 diabetes mellitus. Her disease course has been stable. There are no hypoglycemic associated symptoms. Pertinent negatives for hypoglycemia include no nervousness/anxiousness. Associated symptoms include fatigue. Pertinent negatives for diabetes include no weight loss. There are no hypoglycemic complications. Symptoms are stable. There are no diabetic complications. Risk factors for coronary artery disease include diabetes mellitus, stress, post-menopausal and hypertension. Current diabetic treatment includes diet. She is compliant with treatment some of the time. Her weight is fluctuating minimally. She is following a generally healthy diet. Meal planning includes avoidance of concentrated sweets. She has not had a previous visit with a dietitian. She participates in exercise intermittently. Her breakfast blood glucose range is generally 180-200 mg/dl. (She presents today with her meter and logs showing above target fasting glycemic  profile.  Her previsit A1c on 10/21/22 was 7.2%, essentially unchanged from previous  visit.  She notes she has been on prednisone since last visit for bronchitis, and on multiple rounds of antibiotics for various reasons (having dental work done soon).  Analysis of her meter shows 7-day average of 183, 14-day average of 183, 30-day average of 166, 90-day average of 164.  She notes some trouble sleeping and some depression lately (says sister was diagnosed with lung cancer recently and she now has to see a pulmonologist for lung issues of her own). ) An ACE inhibitor/angiotensin II receptor blocker is not being taken. She does not see a podiatrist.Eye exam is current.  Thyroid Problem Presents for follow-up visit. Symptoms include depressed mood and fatigue. Patient reports no anxiety, constipation, weight gain or weight loss. The symptoms have been stable.    [name] is a patient with the above medical history. she was diagnosed with parathyroid adenoma in 2013 which required parathyroidectomy done by Dr. Armandina Gemma.  She is now experiencing similar symptoms that she had at that time and would like to investigate whether or not there could be a problem with one of her other parathyroid glands.  She thinks she remembers being started on thyroid hormone replacement after her parathyroid removal due to possible injury to the thyroid gland during its removal.  She was a patient of Dr. Anastasio Champion who had started the patient on NP thyroid likely as a result of accidental injury to the thyroid tissue during parathyroidectomy surgery   she was given various doses of NP thyroid over the years, currently on 60 mg. she reports compliance to this medication:  Taking it daily on empty stomach  with water, separated by >30 minutes before breakfast and other medications, and by at least 4 hours from calcium, iron, PPIs, multivitamins .  I reviewed patient's thyroid tests:  Lab Results  Component Value Date   TSH 2.920 10/21/2022   TSH 2.300 01/23/2022   TSH 2.120 10/22/2021   TSH 2.930  07/03/2021   TSH 0.57 10/14/2019   TSH 0.15 (L) 06/29/2019   TSH 2.39 06/23/2018   TSH 2.66 04/16/2017   TSH 1.98 10/14/2016   TSH 4.37 04/04/2016   FREET4 1.13 10/21/2022   FREET4 1.26 01/23/2022   FREET4 1.26 10/22/2021   FREET4 1.23 07/03/2021   FREET4 1.4 06/23/2018   FREET4 1.4 04/16/2017   FREET4 1.5 10/14/2016   FREET4 1.1 04/04/2016   FREET4 1.5 01/04/2016   FREET4 1.15 09/29/2015    Pt denies feeling nodules in neck, hoarseness,  SOB with lying down.  She does report intermittent trouble swallowing pills, something new.  she denies family history of thyroid disorders.  No family history of thyroid cancer.  No history of radiation therapy to head or neck.  No recent use of iodine supplements.  Denies use of Biotin containing supplements.  I reviewed her chart and she also has a history of parathyroid adenoma, controlled Diabetes without medications, GERD, HTN.   Review of systems  Constitutional: + Minimally fluctuating body weight,  current Body mass index is 23.57 kg/m. , no fatigue, no subjective hyperthermia, no subjective hypothermia Eyes: no blurry vision, no xerophthalmia ENT: no sore throat, no nodules palpated in throat, no dysphagia/odynophagia, no hoarseness Cardiovascular: no chest pain, no shortness of breath, no palpitations, no leg swelling Respiratory: no cough, no shortness of breath Gastrointestinal: no nausea/vomiting/diarrhea Musculoskeletal: + muscle aches mostly at night Skin: no rashes, no hyperemia Neurological:  no tremors, no numbness, no tingling, no dizziness Psychiatric: + depression, no anxiety   Objective:   Objective     BP 126/66 (BP Location: Right Arm, Patient Position: Sitting) Comment: Manuel cuff  Pulse (!) 102   Ht '5\' 6"'$  (1.676 m)   Wt 146 lb (66.2 kg)   BMI 23.57 kg/m  Wt Readings from Last 3 Encounters:  10/28/22 146 lb (66.2 kg)  09/27/22 145 lb 12.8 oz (66.1 kg)  06/25/22 149 lb 9.6 oz (67.9 kg)    BP Readings  from Last 3 Encounters:  10/28/22 126/66  09/27/22 122/67  06/25/22 127/68       Physical Exam- Limited  Constitutional:  Body mass index is 23.57 kg/m. , not in acute distress, normal state of mind Eyes:  EOMI, no exophthalmos Musculoskeletal: no gross deformities, strength intact in all four extremities, no gross restriction of joint movements Skin:  no rashes, no hyperemia Neurological: no tremor with outstretched hands   CMP ( most recent) CMP     Component Value Date/Time   NA 139 10/21/2022 0837   K 4.4 10/21/2022 0837   CL 102 10/21/2022 0837   CO2 21 10/21/2022 0837   GLUCOSE 190 (H) 10/21/2022 0837   GLUCOSE 211 (H) 04/08/2022 0713   BUN 12 10/21/2022 0837   CREATININE 0.81 10/21/2022 0837   CREATININE 0.81 12/25/2020 1034   CALCIUM 9.1 10/21/2022 0837   PROT 5.9 (L) 10/21/2022 0837   ALBUMIN 4.2 10/21/2022 0837   AST 32 10/21/2022 0837   ALT 46 (H) 10/21/2022 0837   ALKPHOS 74 10/21/2022 0837   BILITOT 0.4 10/21/2022 0837   GFRNONAA >60 11/20/2021 0916   GFRNONAA 73 12/25/2020 1034   GFRAA 85 12/25/2020 1034     Diabetic Labs (most recent): Lab Results  Component Value Date   HGBA1C 7.2 (H) 10/21/2022   HGBA1C 7.1 (A) 06/25/2022   HGBA1C 7.1 01/30/2022   MICROALBUR '30mg'$ /l 06/25/2022   MICROALBUR 30 07/10/2021   MICROALBUR 0.3 02/18/2018     Lipid Panel ( most recent) Lipid Panel     Component Value Date/Time   CHOL 174 10/21/2022 0837   TRIG 182 (H) 10/21/2022 0837   HDL 37 (L) 10/21/2022 0837   CHOLHDL 4.7 (H) 10/21/2022 0837   CHOLHDL 3.9 12/25/2020 1034   VLDL 33 (H) 10/14/2016 0721   LDLCALC 105 (H) 10/21/2022 0837   LDLCALC 85 12/25/2020 1034   LABVLDL 32 10/21/2022 0837       Lab Results  Component Value Date   TSH 2.920 10/21/2022   TSH 2.300 01/23/2022   TSH 2.120 10/22/2021   TSH 2.930 07/03/2021   TSH 0.57 10/14/2019   TSH 0.15 (L) 06/29/2019   TSH 2.39 06/23/2018   TSH 2.66 04/16/2017   TSH 1.98 10/14/2016   TSH  4.37 04/04/2016   FREET4 1.13 10/21/2022   FREET4 1.26 01/23/2022   FREET4 1.26 10/22/2021   FREET4 1.23 07/03/2021   FREET4 1.4 06/23/2018   FREET4 1.4 04/16/2017   FREET4 1.5 10/14/2016   FREET4 1.1 04/04/2016   FREET4 1.5 01/04/2016   FREET4 1.15 09/29/2015    US Thyroid 06/26/21 CLINICAL DATA:  History of parathyroid adenoma.   EXAM: THYROID ULTRASOUND   TECHNIQUE: Ultrasound examination of the thyroid gland and adjacent soft tissues was performed.   COMPARISON:  04/23/2011   FINDINGS: Parenchymal Echotexture: Mildly heterogenous   Isthmus: 0.1 cm, previously 0.2 cm   Right lobe: 4.6 x 1.9 x 1.4 cm, previously 4.1  x 1.4 x 1.4 cm   Left lobe: 3.1 x 1.0 x 1.1 cm, previously 3.9 x 1.5 x 1.3 cm   _________________________________________________________   Estimated total number of nodules >/= 1 cm: 0   Number of spongiform nodules >/=  2 cm not described below (TR1): 0   Number of mixed cystic and solid nodules >/= 1.5 cm not described below ([place]): 0   _________________________________________________________   No discrete nodules. Previously there was a nodule in the left thyroid lobe that is no longer present.   IMPRESSION: Thyroid tissue is mildly heterogeneous without discrete thyroid nodules.     Electronically Signed   By: Markus Daft M.D.   On: 06/26/2021 11:17    Latest Reference Range & Units 07/03/21 09:18 10/22/21 08:55 01/23/22 09:05 10/21/22 08:37  TSH 0.450 - 4.500 uIU/mL 2.930 2.120 2.300 2.920  Triiodothyronine,Free,Serum 2.0 - 4.4 pg/mL 4.4     T4,Free(Direct) 0.82 - 1.77 ng/dL 1.23 1.26 1.26 1.13  Thyroperoxidase Ab SerPl-aCnc 0 - 34 IU/mL <8     Thyroglobulin Antibody 0.0 - 0.9 IU/mL <1.0        Assessment & Plan:   ASSESSMENT / PLAN:  1. History of parathyroid adenoma- s/p parathyroidectomy  -Her recent calcium levels were normal at 9.4.  She does have low vitamin D currently on replacement.  2. Hypothyroidism-  unspecified  Patient with long-standing hypothyroidism, on NP thyroid 60 mg.  She historically does not tolerate Levothyroxine (causes significant ankle swelling).  - Her previsit TFTs are consistent with appropriate hormone replacement.  She is advised to continue NP thyroid 60 mg po daily before breakfast.    - We discussed about correct intake of levothyroxine, at fasting, with water, separated by at least 30 minutes from breakfast, and separated by more than 4 hours from calcium, iron, multivitamins, acid reflux medications (PPIs). -Patient is made aware of the fact that thyroid hormone replacement is needed for life, dose to be adjusted by periodic monitoring of thyroid function tests.  3. Uncontrolled Diabetes  She presents today with her meter and logs showing above target fasting glycemic profile.  Her previsit A1c on 10/21/22 was 7.2%, essentially unchanged from previous visit.  She notes she has been on prednisone since last visit for bronchitis, and on multiple rounds of antibiotics for various reasons (having dental work done soon).  Analysis of her meter shows 7-day average of 183, 14-day average of 183, 30-day average of 166, 90-day average of 164.  She notes some trouble sleeping and some depression lately (says sister was diagnosed with lung cancer recently and she now has to see a pulmonologist for lung issues of her own).   She is advised to continue Metformin 500 mg ER daily with breakfast and continue monitoring fasting glucose maybe 1-2 times per week to keep an eye out for changes.  - Nutritional counseling repeated at each appointment due to patients tendency to fall back in to old habits.  - The patient admits there is a room for improvement in their diet and drink choices. -  Suggestion is made for the patient to avoid simple carbohydrates from their diet including Cakes, Sweet Desserts / Pastries, Ice Cream, Soda (diet and regular), Sweet Tea, Candies, Chips, Cookies, Sweet  Pastries, Store Bought Juices, Alcohol in Excess of 1-2 drinks a day, Artificial Sweeteners, Coffee Creamer, and "Sugar-free" Products. This will help patient to have stable blood glucose profile and potentially avoid unintended weight gain.   - I encouraged the patient to  switch to unprocessed or minimally processed complex starch and increased protein intake (animal or plant source), fruits, and vegetables.   - Patient is advised to stick to a routine mealtimes to eat 3 meals a day and avoid unnecessary snacks (to snack only to correct hypoglycemia).  4. Vitamin D deficiency Her most recent vitamin D level was 51.2 on 10/21/22.  She reportedly does not tolerate OTC vitamin D, causes upset stomach and diarrhea.  I refilled her Ergocalciferol 50000 units PO weekly which she is tolerating well.        I spent 30 minutes in the care of the patient today including review of labs from [facility], Lipids, Thyroid Function, Hematology (current and previous including abstractions from other facilities); face-to-face time discussing  her blood glucose readings/logs, discussing hypoglycemia and hyperglycemia episodes and symptoms, medications doses, her options of short and long term treatment based on the latest standards of care / guidelines;  discussion about incorporating lifestyle medicine;  and documenting the encounter. Risk reduction counseling performed per USPSTF guidelines to reduce obesity and cardiovascular risk factors.     Please refer to Patient Instructions for Blood Glucose Monitoring and Insulin/Medications Dosing Guide"  in media tab for additional information. Please  also refer to " Patient Self Inventory" in the Media  tab for reviewed elements of pertinent patient history.  [name] participated in the discussions, expressed understanding, and voiced agreement with the above plans.  All questions were answered to her satisfaction. she is encouraged to contact clinic should she have any  questions or concerns prior to her return visit.   FOLLOW UP PLAN:  Return in about 4 months (around 02/26/2023) for Diabetes F/U with A1c in office, No previsit labs, Bring meter and logs.   [name], [facility] [address] [place], [place] [address] Phone: [phone] Fax: [phone]  10/28/2022, 11:04 AM

## 2022-11-04 ENCOUNTER — Telehealth (INDEPENDENT_AMBULATORY_CARE_PROVIDER_SITE_OTHER): Payer: Self-pay | Admitting: *Deleted

## 2022-11-04 NOTE — Telephone Encounter (Signed)
Any room Thanks 

## 2022-11-04 NOTE — Telephone Encounter (Signed)
Who is your primary care physician: Dr. Posey Pronto  Reasons for the colonoscopy: screening, [facility]  Have you had a colonoscopy before?  Over 10 years ago  Do you have family history of colon cancer? Yes, sister  Previous colonoscopy with polyps removed? no  Do you have a history colorectal cancer?   no  Are you diabetic? If yes, Type 1 or Type 2?    Yes, type 2 Do you have a prosthetic or mechanical heart valve? no  Do you have a pacemaker/defibrillator?   no  Have you had endocarditis/atrial fibrillation? no  Have you had joint replacement within the last 12 months?  no  Do you tend to be constipated or have to use laxatives? no  Do you have any history of drugs or alchohol?  no  Do you use supplemental oxygen?  no  Have you had a stroke or heart attack within the last 6 months? no  Do you take weight loss medication?  no  For female patients: have you had a hysterectomy?  yes                                     are you post menopausal?       yes                                            do you still have your menstrual cycle? no      Do you take any blood-thinning medications such as: (aspirin, warfarin, Plavix, Aggrenox)  no  If yes we need the name, milligram, dosage and who is prescribing doctor  Current Outpatient Medications on File Prior to Visit  Medication Sig Dispense Refill   Accu-Chek Softclix Lancets lancets Use as instructed to monitor glucose once daily. 100 each 2   acetaminophen (TYLENOL) 500 MG tablet Take 500 mg by mouth every 6 (six) hours as needed.     amLODipine (NORVASC) 10 MG tablet TAKE 1 TABLET(10 MG) BY MOUTH DAILY 90 tablet 0   blood glucose meter kit and supplies KIT Dispense based on patient and insurance preference. Use twice daily (before breakfast and before bed). 1 each 0   estradiol (ESTRACE) 1 MG tablet TAKE 1 TABLET(1 MG) BY MOUTH DAILY 90 tablet 1   ezetimibe (ZETIA) 10 MG tablet Take 1 tablet (10 mg total) by mouth daily. 90 tablet 3    glucose blood (ACCU-CHEK GUIDE) test strip Use as instructed to check blood glucose twice daily, once before breakfast and once before bedtime. 200 strip 1   metFORMIN (GLUCOPHAGE-XR) 500 MG 24 hr tablet Take 1 tablet (500 mg total) by mouth daily with breakfast. 90 tablet 3   metoprolol tartrate (LOPRESSOR) 25 MG tablet Take 1 tablet (25 mg total) by mouth 2 (two) times daily. 180 tablet 3   thyroid (NP THYROID) 60 MG tablet TAKE 1 TABLET BY MOUTH EVERY DAY BEFORE BREAKFAST 90 tablet 3   Vitamin D, Ergocalciferol, (DRISDOL) 1.25 MG (50000 UNIT) CAPS capsule Take 1 capsule (50,000 Units total) by mouth every 7 (seven) days. 12 capsule 1   No current facility-administered medications on file prior to visit.    Allergies  Allergen Reactions   Advair Diskus [Fluticasone-Salmeterol] Anaphylaxis   Codeine Nausea And Vomiting   Hydrocodone Nausea And Vomiting  Levothyroxine Other (See Comments)    Caused lower leg swelling   Oxycodone Nausea And Vomiting   Statins    Tetracyclines & Related Itching    Internal     Pharmacy: [facility]  Primary Insurance Name: Medicare  Secondary Insurance Name: [place] number where you can be reached: [phone]

## 2022-11-06 MED ORDER — PEG 3350-KCL-NA BICARB-NACL 420 G PO SOLR
4000.0000 mL | Freq: Once | ORAL | 0 refills | Status: AC
Start: 2022-11-06 — End: 2022-11-06

## 2022-11-06 NOTE — Telephone Encounter (Signed)
Spoke with pt. She has been scheduled for 2/28 at 10am. Aware will send instructions to her. Rx for prep sent to pharmacy

## 2022-11-06 NOTE — Addendum Note (Signed)
Addended by: Cheron Every on: 11/06/2022 03:30 PM   Modules accepted: Orders

## 2022-11-07 NOTE — Telephone Encounter (Signed)
Referral completed

## 2022-11-08 ENCOUNTER — Other Ambulatory Visit: Payer: Self-pay | Admitting: Internal Medicine

## 2022-11-08 DIAGNOSIS — I10 Essential (primary) hypertension: Secondary | ICD-10-CM

## 2022-11-13 ENCOUNTER — Encounter: Payer: Self-pay | Admitting: *Deleted

## 2022-11-27 ENCOUNTER — Encounter (INDEPENDENT_AMBULATORY_CARE_PROVIDER_SITE_OTHER): Payer: Self-pay | Admitting: *Deleted

## 2022-11-27 ENCOUNTER — Other Ambulatory Visit: Payer: Self-pay

## 2022-11-27 ENCOUNTER — Ambulatory Visit (HOSPITAL_COMMUNITY): Payer: Medicare Other | Admitting: Anesthesiology

## 2022-11-27 ENCOUNTER — Ambulatory Visit (HOSPITAL_COMMUNITY)
Admission: RE | Admit: 2022-11-27 | Discharge: 2022-11-27 | Disposition: A | Discharge status: Home / Self Care | Payer: Medicare Other | Source: Ambulatory Visit | Attending: Gastroenterology | Admitting: Gastroenterology

## 2022-11-27 ENCOUNTER — Ambulatory Visit (HOSPITAL_BASED_OUTPATIENT_CLINIC_OR_DEPARTMENT_OTHER): Payer: Medicare Other | Admitting: Anesthesiology

## 2022-11-27 ENCOUNTER — Encounter (HOSPITAL_COMMUNITY)
Admission: RE | Disposition: A | Discharge status: Home / Self Care | Payer: Self-pay | Source: Ambulatory Visit | Attending: Gastroenterology

## 2022-11-27 ENCOUNTER — Encounter (HOSPITAL_COMMUNITY): Payer: Self-pay | Admitting: Gastroenterology

## 2022-11-27 DIAGNOSIS — E039 Hypothyroidism, unspecified: Secondary | ICD-10-CM | POA: Diagnosis not present

## 2022-11-27 DIAGNOSIS — J449 Chronic obstructive pulmonary disease, unspecified: Secondary | ICD-10-CM | POA: Diagnosis not present

## 2022-11-27 DIAGNOSIS — K219 Gastro-esophageal reflux disease without esophagitis: Secondary | ICD-10-CM | POA: Diagnosis not present

## 2022-11-27 DIAGNOSIS — Z1211 Encounter for screening for malignant neoplasm of colon: Secondary | ICD-10-CM

## 2022-11-27 DIAGNOSIS — Z7984 Long term (current) use of oral hypoglycemic drugs: Secondary | ICD-10-CM | POA: Diagnosis not present

## 2022-11-27 DIAGNOSIS — Z800 Family history of malignant neoplasm of digestive organs: Secondary | ICD-10-CM | POA: Diagnosis not present

## 2022-11-27 DIAGNOSIS — R519 Headache, unspecified: Secondary | ICD-10-CM | POA: Insufficient documentation

## 2022-11-27 DIAGNOSIS — K648 Other hemorrhoids: Secondary | ICD-10-CM

## 2022-11-27 DIAGNOSIS — Z87891 Personal history of nicotine dependence: Secondary | ICD-10-CM | POA: Diagnosis not present

## 2022-11-27 DIAGNOSIS — M1990 Unspecified osteoarthritis, unspecified site: Secondary | ICD-10-CM | POA: Diagnosis not present

## 2022-11-27 DIAGNOSIS — E119 Type 2 diabetes mellitus without complications: Secondary | ICD-10-CM | POA: Diagnosis not present

## 2022-11-27 DIAGNOSIS — I10 Essential (primary) hypertension: Secondary | ICD-10-CM | POA: Diagnosis not present

## 2022-11-27 DIAGNOSIS — E785 Hyperlipidemia, unspecified: Secondary | ICD-10-CM | POA: Diagnosis not present

## 2022-11-27 HISTORY — PX: COLONOSCOPY WITH PROPOFOL: SHX5780

## 2022-11-27 LAB — GLUCOSE, CAPILLARY: Glucose-Capillary: 150 mg/dL — ABNORMAL HIGH (ref 70–99)

## 2022-11-27 LAB — HM COLONOSCOPY

## 2022-11-27 SURGERY — COLONOSCOPY WITH PROPOFOL
Anesthesia: General

## 2022-11-27 MED ORDER — LACTATED RINGERS IV SOLN: INTRAVENOUS | Status: DC

## 2022-11-27 MED ORDER — STERILE WATER FOR IRRIGATION IR SOLN
Status: DC | PRN
  Administered 2022-11-27: 120 mL
  Administered 2022-11-27: 240 mL

## 2022-11-27 MED ORDER — PROPOFOL 500 MG/50ML IV EMUL
INTRAVENOUS | Status: DC | PRN
  Administered 2022-11-27: 150 ug/kg/min via INTRAVENOUS

## 2022-11-27 MED ORDER — PROPOFOL 500 MG/50ML IV EMUL
INTRAVENOUS | Status: AC
  Filled 2022-11-27: qty 50

## 2022-11-27 MED ORDER — PROPOFOL 10 MG/ML IV BOLUS
INTRAVENOUS | Status: DC | PRN
  Administered 2022-11-27: 60 mg via INTRAVENOUS

## 2022-11-27 NOTE — Anesthesia Preprocedure Evaluation (Addendum)
Anesthesia Evaluation  Patient identified by MRN, date of birth, ID band Patient awake    Reviewed: Allergy & Precautions, H&P , NPO status , Patient's Chart, lab work & pertinent test results, reviewed documented beta blocker date and time   History of Anesthesia Complications (+) PONV and history of anesthetic complications  Airway Mallampati: II  TM Distance: >3 FB Neck ROM: Full    Dental  (+) Dental Advisory Given, Partial Upper   Pulmonary asthma , COPD,  COPD inhaler, former smoker   Pulmonary exam normal breath sounds clear to auscultation       Cardiovascular Exercise Tolerance: Good hypertension, Pt. on medications and Pt. on home beta blockers Normal cardiovascular exam Rhythm:Regular Rate:Normal     Neuro/Psych  Headaches TIA negative psych ROS   GI/Hepatic Neg liver ROS,GERD  Medicated and Controlled,,  Endo/Other  diabetes, Well Controlled, Type 2, Oral Hypoglycemic AgentsHypothyroidism    Renal/GU Renal disease  negative genitourinary   Musculoskeletal  (+) Arthritis , Osteoarthritis,    Abdominal   Peds negative pediatric ROS (+)  Hematology negative hematology ROS (+)   Anesthesia Other Findings   Reproductive/Obstetrics negative OB ROS                             Anesthesia Physical Anesthesia Plan  ASA: 2  Anesthesia Plan: General   Post-op Pain Management: Minimal or no pain anticipated   Induction: Intravenous  PONV Risk Score and Plan: Propofol infusion  Airway Management Planned: Nasal Cannula and Natural Airway  Additional Equipment:   Intra-op Plan:   Post-operative Plan:   Informed Consent: I have reviewed the patients History and Physical, chart, labs and discussed the procedure including the risks, benefits and alternatives for the proposed anesthesia with the patient or authorized representative who has indicated his/her understanding and  acceptance.     Dental advisory given  Plan Discussed with: CRNA and Surgeon  Anesthesia Plan Comments:        Anesthesia Quick Evaluation

## 2022-11-27 NOTE — H&P (Signed)
[name] is an 72 y.o. female.   Chief Complaint: family history colon cancer HPI: 72 y/o F with PMH asthma, GERD, hypothyroidism, hyperlipidemia, TIA, IBS, diabetes, coming for family history of colon cancer.  Last colonoscopy performed 11 years ago which was normal.  Had a negative Cologuard 3 years ago.  The patient denies having any complaints such as melena, hematochezia, abdominal pain or distention, change in her bowel movement consistency or frequency, no changes in weight recently.  Sister diagnosed with colon cancer in her 67s.   Past Medical History:  Diagnosis Date   Arthritis    Asthma    takes Singulair nightly   Diabetes mellitus    takes levemir daily and Metformin daily   Generalized headaches    GERD (gastroesophageal reflux disease)    takes Omeprazole daily   Herniated disc, cervical    History of kidney stones    Hyperlipidemia    takes Welchol daily   Hypertension    takes Enalapril dialy   Hypothyroidism    takes Synthroid daily   IBS (irritable bowel syndrome)    Joint pain    hands and neck   Nocturia    PONV (postoperative nausea and vomiting)    TIA (transient ischemic attack)    diagnosed by EEG but never knew she had it   Trigger finger    Weakness    Weakness    in both hands    Past Surgical History:  Procedure Laterality Date   ABDOMINAL HYSTERECTOMY  1980   ANTERIOR CERVICAL DECOMP/DISCECTOMY FUSION N/A 10/18/2013   Procedure: Cervical three-four, Cervical four-five, Cervical seven-Thoracic one anterior cervical decompression with fusion plating and bonegraft;  Surgeon: Hosie Spangle, MD;  Location: [facility];  Service: Neurosurgery;  Laterality: N/A;  Cervical three-four, Cervical four-five, Cervical seven-Thoracic one anterior cervical decompression with fusion plating and bonegraft   APPENDECTOMY     done with hysterectomy   BACK SURGERY  2007   lumbar fusion   carpel tunnel Bilateral    left first then right   cataract  implants     CHOLECYSTECTOMY N/A 11/22/2021   Procedure: LAPAROSCOPIC CHOLECYSTECTOMY;  Surgeon: Rusty Aus, DO;  Location: [facility];  Service: General;  Laterality: N/A;   COLONOSCOPY     CYSTOSCOPY WITH RETROGRADE PYELOGRAM, URETEROSCOPY AND STENT PLACEMENT Left 04/08/2022   Procedure: CYSTOSCOPY WITH RETROGRADE PYELOGRAM, URETEROSCOPY AND STENT PLACEMENT;  Surgeon: Franchot Gallo, MD;  Location[facility];  Service: Urology;  Laterality: Left;   ESOPHAGOGASTRODUODENOSCOPY     hx of h pylori   EYE SURGERY  11/18/2008   right - cataracts   EYE SURGERY  06/01/2009   left - cataracts   herniated disc   10/2000, 08/31/2008   C5-C6 then C6-C7   HOLMIUM LASER APPLICATION Left [id number]   Procedure: HOLMIUM LASER APPLICATION;  Surgeon: Franchot Gallo, MD;  Location: [facility];  Service: Urology;  Laterality: Left;   IR URETERAL STENT RIGHT NEW ACCESS W/O SEP NEPHROSTOMY CATH  07/26/2021   knee anthroscopy bilateral Bilateral    KNEE ARTHROSCOPY     LITHOTRIPSY  11/12/2007   right side   NEPHROLITHOTOMY Right 07/26/2021   Procedure: NEPHROLITHOTOMY PERCUTANEOUS;  Surgeon: Franchot Gallo, MD;  Location: [facility];  Service: Urology;  Laterality: Right;   Parathyroid surgery     October 2013   PARATHYROIDECTOMY  07/02/2012   Procedure: PARATHYROIDECTOMY;  Surgeon: Earnstine Regal, MD;  Location: [facility];  Service:  General;  Laterality: N/A;   TONSILLECTOMY     as a child    Family History  Problem Relation Age of Onset   Cancer Mother        pancreatic   Social History:  reports that she quit smoking about 10 years ago. Her smoking use included cigarettes. She has a 40.00 pack-year smoking history. She has never used smokeless tobacco. She reports that she does not drink alcohol and does not use drugs.  Allergies:  Allergies  Allergen Reactions   Advair Diskus [Fluticasone-Salmeterol] Anaphylaxis   Codeine Nausea And Vomiting   Hydrocodone  Nausea And Vomiting   Levothyroxine Other (See Comments)    Caused lower leg swelling   Oxycodone Nausea And Vomiting   Statins     Muscle and joint pain   Tetracyclines & Related Itching    Internal    Medications Prior to Admission  Medication Sig Dispense Refill   acetaminophen (TYLENOL) 500 MG tablet Take 500 mg by mouth every 6 (six) hours as needed.     amLODipine (NORVASC) 10 MG tablet TAKE 1 TABLET(10 MG) BY MOUTH DAILY 90 tablet 0   estradiol (ESTRACE) 1 MG tablet TAKE 1 TABLET(1 MG) BY MOUTH DAILY 90 tablet 1   ezetimibe (ZETIA) 10 MG tablet Take 1 tablet (10 mg total) by mouth daily. 90 tablet 3   metFORMIN (GLUCOPHAGE-XR) 500 MG 24 hr tablet Take 1 tablet (500 mg total) by mouth daily with breakfast. 90 tablet 3   metoprolol tartrate (LOPRESSOR) 25 MG tablet TAKE 1 TABLET(25 MG) BY MOUTH TWICE DAILY 180 tablet 3   thyroid (NP THYROID) 60 MG tablet TAKE 1 TABLET BY MOUTH EVERY DAY BEFORE BREAKFAST 90 tablet 3   Vitamin D, Ergocalciferol, (DRISDOL) 1.25 MG (50000 UNIT) CAPS capsule Take 1 capsule (50,000 Units total) by mouth every 7 (seven) days. 12 capsule 1   Accu-Chek Softclix Lancets lancets Use as instructed to monitor glucose once daily. 100 each 2   blood glucose meter kit and supplies KIT Dispense based on patient and insurance preference. Use twice daily (before breakfast and before bed). 1 each 0   glucose blood (ACCU-CHEK GUIDE) test strip Use as instructed to check blood glucose twice daily, once before breakfast and once before bedtime. 200 strip 1    No results found for this or any previous visit (from the past 48 hour(s)). No results found.  Review of Systems  All other systems reviewed and are negative.   Blood pressure 134/64, pulse 71, temperature 97.9 F (36.6 C), temperature source Oral, resp. rate 19, height '5\' 6"'$  (1.676 m), weight 65.3 kg, SpO2 100 %. Physical Exam  GENERAL: The patient is AO x3, in no acute distress. HEENT: Head is normocephalic  and atraumatic. EOMI are intact. Mouth is well hydrated and without lesions. NECK: Supple. No masses LUNGS: Clear to auscultation. No presence of rhonchi/wheezing/rales. Adequate chest expansion HEART: RRR, normal s1 and s2. ABDOMEN: Soft, nontender, no guarding, no peritoneal signs, and nondistended. BS +. No masses. EXTREMITIES: Without any cyanosis, clubbing, rash, lesions or edema. NEUROLOGIC: AOx3, no focal motor deficit. SKIN: no jaundice, no rashes  Assessment/Plan 72 y/o F with PMH asthma, GERD, hypothyroidism, hyperlipidemia, TIA, IBS, diabetes, coming for family history of colon cancer.  Will proceed with colonoscopy.  Harvel Quale, MD 11/27/2022, 9:09 AM

## 2022-11-27 NOTE — Discharge Instructions (Signed)
You are being discharged to home.  Resume your previous diet.  Your physician has recommended a repeat colonoscopy in five years for screening purposes.

## 2022-11-27 NOTE — Op Note (Signed)
[facility] Patient Name: [name] Procedure Date: 11/27/2022 9:11 AM MRN: [id number] Date of Birth: 1950-12-30 Attending MD: Maylon Peppers , , [id number] CSN: [id number] Age: 72 Admit Type: Outpatient Procedure:                Colonoscopy Indications:              Screening patient at increased risk: Family history                            of 1st-degree relative with colorectal cancer at                            age 83 years (or older) Providers:                Maylon Peppers, Rosina Lowenstein, RN, Lurline Del, RN Referring MD:              Medicines:                Monitored Anesthesia Care Complications:            No immediate complications. Estimated Blood Loss:     Estimated blood loss: none. Procedure:                Pre-Anesthesia Assessment:                           - Prior to the procedure, a History and Physical                            was performed, and patient medications, allergies                            and sensitivities were reviewed. The patient's                            tolerance of previous anesthesia was reviewed.                           - The risks and benefits of the procedure and the                            sedation options and risks were discussed with the                            patient. All questions were answered and informed                            consent was obtained.                           - ASA Grade Assessment: II - A patient with mild                            systemic disease.                           After obtaining  informed consent, the colonoscope                            was passed under direct vision. Throughout the                            procedure, the patient's blood pressure, pulse, and                            oxygen saturations were monitored continuously. The                            [id number]-[id number] scope was introduced through                            the anus and advanced to the  the cecum, identified                            by appendiceal orifice and ileocecal valve. The                            colonoscopy was performed without difficulty. The                            patient tolerated the procedure well. The quality                            of the bowel preparation was excellent. Scope In: 9:21:04 AM Scope Out: 9:38:20 AM Scope Withdrawal Time: 0 hours 11 minutes 2 seconds  Total Procedure Duration: 0 hours 17 minutes 16 seconds  Findings:      The perianal and digital rectal examinations were normal.      The colon (entire examined portion) appeared normal.      Non-bleeding internal hemorrhoids were found during retroflexion. The       hemorrhoids were small. Impression:               - The entire examined colon is normal.                           - Non-bleeding internal hemorrhoids.                           - No specimens collected. Moderate Sedation:      Per Anesthesia Care Recommendation:           - Discharge patient to home (ambulatory).                           - Resume previous diet.                           - Repeat colonoscopy in 5 years for screening                            purposes. Procedure Code(s):        --- Professional ---  G0105, Colorectal cancer screening; colonoscopy on                            individual at high risk Diagnosis Code(s):        --- Professional ---                           Z80.0, Family history of malignant neoplasm of                            digestive organs                           K64.8, Other hemorrhoids CPT copyright 2022 [employer]. All rights reserved. The codes documented in this report are preliminary and upon coder review may  be revised to meet current compliance requirements. Maylon Peppers, MD Maylon Peppers,  11/27/2022 9:46:30 AM This report has been signed electronically. Number of Addenda: 0

## 2022-11-27 NOTE — Anesthesia Postprocedure Evaluation (Signed)
Anesthesia Post Note  Patient: [name]  Procedure(s) Performed: COLONOSCOPY WITH PROPOFOL  Patient location during evaluation: Phase II Anesthesia Type: General Level of consciousness: awake and alert and oriented Pain management: pain level controlled Vital Signs Assessment: post-procedure vital signs reviewed and stable Respiratory status: spontaneous breathing, nonlabored ventilation and respiratory function stable Cardiovascular status: blood pressure returned to baseline and stable Postop Assessment: no apparent nausea or vomiting Anesthetic complications: no  No notable events documented.   Last Vitals:  Vitals:   11/27/22 0847 11/27/22 0944  BP: 134/64 (!) 105/45  Pulse: 71 75  Resp: 19 15  Temp: 36.6 C (!) 36.4 C  SpO2: 100% 97%    Last Pain:  Vitals:   11/27/22 0944  TempSrc: Oral  PainSc: 0-No pain                 Maxime Beckner C Vasil Juhasz

## 2022-11-27 NOTE — Transfer of Care (Signed)
Immediate Anesthesia Transfer of Care Note  Patient: [name]  Procedure(s) Performed: COLONOSCOPY WITH PROPOFOL  Patient Location: PACU  Anesthesia Type:General  Level of Consciousness: awake, alert , and oriented  Airway & Oxygen Therapy: Patient Spontanous Breathing  Post-op Assessment: Report given to RN, Post -op Vital signs reviewed and stable, Patient moving all extremities X 4, and Patient able to stick tongue midline  Post vital signs: Reviewed  Last Vitals:  Vitals Value Taken Time  BP 105/45 11/27/22 0944  Temp 36.4 C 11/27/22 0944  Pulse 75 11/27/22 0944  Resp 15 11/27/22 0944  SpO2 97 % 11/27/22 0944    Last Pain:  Vitals:   11/27/22 0944  TempSrc: Oral  PainSc: 0-No pain      Patients Stated Pain Goal: 7 ([id number])  Complications: No notable events documented.

## 2022-11-27 NOTE — Anesthesia Procedure Notes (Signed)
Procedure Name: General with mask airway Date/Time: 11/27/2022 9:16 AM  Performed by: Maude Leriche, CRNAPre-anesthesia Checklist: Patient identified, Emergency Drugs available, Suction available, Patient being monitored and Timeout performed Patient Re-evaluated:Patient Re-evaluated prior to induction Oxygen Delivery Method: Nasal cannula Placement Confirmation: positive ETCO2 Dental Injury: Teeth and Oropharynx as per pre-operative assessment

## 2022-12-05 ENCOUNTER — Ambulatory Visit (INDEPENDENT_AMBULATORY_CARE_PROVIDER_SITE_OTHER): Payer: Medicare Other | Admitting: Pulmonary Disease

## 2022-12-05 ENCOUNTER — Encounter: Payer: Self-pay | Admitting: Pulmonary Disease

## 2022-12-05 VITALS — BP 128/64 | HR 70 | Ht 66.0 in | Wt 146.8 lb

## 2022-12-05 DIAGNOSIS — J849 Interstitial pulmonary disease, unspecified: Secondary | ICD-10-CM

## 2022-12-05 NOTE — Progress Notes (Signed)
Subjective:    Patient ID: [name], female    DOB: Feb 22, 1951, 72 y.o.   MRN: [id number]  HPI  72 year old remote smoker presents for evaluation of ILD incidentally detected on screening CT scan. She had an episode of bronchitis right after Christmas, she was already taking antibiotic for dental work, this persisted for 4 weeks and she required prednisone and Tessalon Perles and was also given albuterol MDI. She underwent screening low-dose CT chest for lung cancer and this showed peripheral predominant areas of groundglass with some subpleural reticulation Implying a nodular area in the right lower lobe.  Hence she was referred to [place]. Surprisingly, she denies any significant dyspnea on exertion and in fact she goes dancing to 3 times a week. She feels that her cough is resolved and now she is back to her baseline.  PMH -hypertension Parathyroidectomy Diabetes type 2 Asthma She smoked about 20 pack years before she quit 2013  On ambulation oxygen saturation stayed at 98% after 3 laps  Significant tests/ events reviewed  LDCT chest 10/22/22 >>widespread areas of peripheral predominant ground-glass attenuation, septal thickening, subpleural reticulation and regional areas of architectural distortion scattered throughout the mid to lower lungs bilaterally, nodular area RLL 35m "indeterminate" pattern , mild emphysema  Past Medical History:  Diagnosis Date   Arthritis    Asthma    takes Singulair nightly   Diabetes mellitus    takes levemir daily and Metformin daily   Generalized headaches    GERD (gastroesophageal reflux disease)    takes Omeprazole daily   Herniated disc, cervical    History of kidney stones    Hyperlipidemia    takes Welchol daily   Hypertension    takes Enalapril dialy   Hypothyroidism    takes Synthroid daily   IBS (irritable bowel syndrome)    Joint pain    hands and neck   Nocturia    PONV (postoperative nausea and vomiting)    TIA  (transient ischemic attack)    diagnosed by EEG but never knew she had it   Trigger finger    Weakness    Weakness    in both hands    Past Surgical History:  Procedure Laterality Date   ABDOMINAL HYSTERECTOMY  1980   ANTERIOR CERVICAL DECOMP/DISCECTOMY FUSION N/A 10/18/2013   Procedure: Cervical three-four, Cervical four-five, Cervical seven-Thoracic one anterior cervical decompression with fusion plating and bonegraft;  Surgeon: RHosie Spangle MD;  Location: [facility];  Service: Neurosurgery;  Laterality: N/A;  Cervical three-four, Cervical four-five, Cervical seven-Thoracic one anterior cervical decompression with fusion plating and bonegraft   APPENDECTOMY     done with hysterectomy   BACK SURGERY  2007   lumbar fusion   carpel tunnel Bilateral    left first then right   cataract implants     CHOLECYSTECTOMY N/A 11/22/2021   Procedure: LAPAROSCOPIC CHOLECYSTECTOMY;  Surgeon: PRusty Aus DO;  Location: [facility];  Service: General;  Laterality: N/A;   COLONOSCOPY     CYSTOSCOPY WITH RETROGRADE PYELOGRAM, URETEROSCOPY AND STENT PLACEMENT Left 04/08/2022   Procedure: CYSTOSCOPY WITH RETROGRADE PYELOGRAM, URETEROSCOPY AND STENT PLACEMENT;  Surgeon: DFranchot Gallo MD;  [facility]  Service: Urology;  Laterality: Left;   ESOPHAGOGASTRODUODENOSCOPY     hx of h pylori   EYE SURGERY  11/18/2008   right - cataracts   EYE SURGERY  06/01/2009   left - cataracts   herniated disc   10/2000, 08/31/2008  C5-C6 then C6-C7   HOLMIUM LASER APPLICATION Left [id number]   Procedure: HOLMIUM LASER APPLICATION;  Surgeon: Franchot Gallo, MD;  Location: [facility];  Service: Urology;  Laterality: Left;   IR URETERAL STENT RIGHT NEW ACCESS W/O SEP NEPHROSTOMY CATH  07/26/2021   knee anthroscopy bilateral Bilateral    KNEE ARTHROSCOPY     LITHOTRIPSY  11/12/2007   right side   NEPHROLITHOTOMY Right 07/26/2021   Procedure: NEPHROLITHOTOMY  PERCUTANEOUS;  Surgeon: Franchot Gallo, MD;  Location: [facility];  Service: Urology;  Laterality: Right;   Parathyroid surgery     2012-07-21   PARATHYROIDECTOMY  07/02/2012   Procedure: PARATHYROIDECTOMY;  Surgeon: Earnstine Regal, MD;  Location: [facility];  Service: General;  Laterality: N/A;   TONSILLECTOMY     as a child    Allergies  Allergen Reactions   Advair Diskus [Fluticasone-Salmeterol] Anaphylaxis   Codeine Nausea And Vomiting   Hydrocodone Nausea And Vomiting   Levothyroxine Other (See Comments)    Caused lower leg swelling   Oxycodone Nausea And Vomiting   Statins     Muscle and joint pain   Tetracyclines & Related Itching    Internal    Social History   Socioeconomic History   Marital status: Widowed    Spouse name: Not on file   Number of children: Not on file   Years of education: Not on file   Highest education level: Not on file  Occupational History   Not on file  Tobacco Use   Smoking status: Former    Packs/day: 1.00    Years: 40.00    Total pack years: 40.00    Types: Cigarettes    Quit date: 07/19/2012    Years since quitting: 10.3   Smokeless tobacco: Never   Tobacco comments:    quit smoking in 07/21/2012  Vaping Use   Vaping Use: Never used  Substance and Sexual Activity   Alcohol use: No    Alcohol/week: 0.0 standard drinks of alcohol   Drug use: No   Sexual activity: Not Currently    Birth control/protection: Surgical  Other Topics Concern   Not on file  Social History Narrative   Widow,husband died 07/22/2019 after 53 years marriage.Retired.   Social Determinants of Health   Financial Resource Strain: Low Risk  (03/05/2022)   Overall Financial Resource Strain (CARDIA)    Difficulty of Paying Living Expenses: Not hard at all  Food Insecurity: No Food Insecurity (03/05/2022)   Hunger Vital Sign    Worried About Running Out of Food in the Last Year: Never true    Ran Out of Food in the Last Year: Never true  Transportation Needs:  No Transportation Needs (03/05/2022)   PRAPARE - Hydrologist (Medical): No    Lack of Transportation (Non-Medical): No  Physical Activity: Insufficiently Active (03/05/2022)   Exercise Vital Sign    Days of Exercise per Week: 4 days    Minutes of Exercise per Session: 30 min  Stress: No Stress Concern Present (03/05/2022)   [place]    Feeling of Stress : Not at all  Social Connections: Moderately Integrated (03/05/2022)   Social Connection and Isolation Panel [NHANES]    Frequency of Communication with Friends and Family: More than three times a week    Frequency of Social Gatherings with Friends and Family: More than three times a week    Attends  Religious Services: More than 4 times per year    Active Member of Clubs or Organizations: Yes    Attends Archivist Meetings: More than 4 times per year    Marital Status: Widowed  Intimate Partner Violence: Not At Risk (03/05/2022)   Humiliation, Afraid, Rape, and Kick questionnaire    Fear of Current or Ex-Partner: No    Emotionally Abused: No    Physically Abused: No    Sexually Abused: No    Family History  Problem Relation Age of Onset   Cancer Mother        pancreatic     Review of Systems  Constitutional: negative for anorexia, fevers and sweats  Eyes: negative for irritation, redness and visual disturbance  Ears, nose, mouth, throat, and face: negative for earaches, epistaxis, nasal congestion and sore throat  Respiratory: negative for cough, dyspnea on exertion, sputum and wheezing  Cardiovascular: negative for chest pain, dyspnea, lower extremity edema, orthopnea, palpitations and syncope  Gastrointestinal: negative for abdominal pain, constipation, diarrhea, melena, nausea and vomiting  Genitourinary:negative for dysuria, frequency and hematuria  Hematologic/lymphatic: negative for bleeding, easy bruising and  lymphadenopathy  Musculoskeletal:negative for arthralgias, muscle weakness and stiff joints  Neurological: negative for coordination problems, gait problems, headaches and weakness  Endocrine: negative for diabetic symptoms including polydipsia, polyuria and weight loss     Objective:   Physical Exam  Gen. Pleasant, well-nourished, in no distress, normal affect ENT - no pallor,icterus, no post nasal drip Neck: No JVD, no thyromegaly, no carotid bruits Lungs: no use of accessory muscles, no dullness to percussion, clear without rales or rhonchi  Cardiovascular: Rhythm regular, heart sounds  normal, no murmurs or gallops, no peripheral edema Abdomen: soft and non-tender, no hepatosplenomegaly, BS normal. Musculoskeletal: No deformities, no cyanosis or clubbing Neuro:  alert, non focal        Assessment & Plan:   ILD -incidental finding on screening CT chest.  This CT was done a few weeks after episode of likely viral bronchitis and could indicate residual damage from viral pneumonia. Surprisingly, she is completely asymptomatic and denies any baseline dyspnea on exertion and does not desaturate on ambulation. So I am hopeful that this was a temporary finding which may have resolved.  We will repeat a scan as a high-resolution CT chest with a 67-monthinterval in April 2024.  This will also serve as a follow-up study for the pulmonary nodule that was noted on this initial scan.  I expect to see significant improvement in the groundglass but would like to see whether radicular changes persist The degree of emphysema is very mild and likely related to her previous history of smoking.  Will obtain PFTs to quantitate

## 2022-12-05 NOTE — Patient Instructions (Addendum)
  X High res CT chest in April 2024  X Amb sat  X schedule PFTs

## 2022-12-06 ENCOUNTER — Encounter (HOSPITAL_COMMUNITY): Payer: Self-pay | Admitting: Gastroenterology

## 2022-12-09 ENCOUNTER — Other Ambulatory Visit: Payer: Self-pay | Admitting: Internal Medicine

## 2022-12-09 DIAGNOSIS — N200 Calculus of kidney: Secondary | ICD-10-CM | POA: Diagnosis not present

## 2022-12-09 DIAGNOSIS — I10 Essential (primary) hypertension: Secondary | ICD-10-CM

## 2022-12-23 ENCOUNTER — Other Ambulatory Visit: Payer: Self-pay | Admitting: Nurse Practitioner

## 2023-01-03 ENCOUNTER — Telehealth: Payer: Self-pay | Admitting: Internal Medicine

## 2023-01-03 NOTE — Telephone Encounter (Signed)
Pt called in regard to ezetimibe (ZETIA) 10 MG tablet  Patient states that se has been having stomach issues / dizzy  spells since taking med. Pt wants to stop med and see I f symptoms clear. Wants a call back

## 2023-01-03 NOTE — Telephone Encounter (Signed)
Left VM

## 2023-01-17 ENCOUNTER — Ambulatory Visit (HOSPITAL_COMMUNITY)
Admission: RE | Admit: 2023-01-17 | Discharge: 2023-01-17 | Disposition: A | Discharge status: Home / Self Care | Payer: Medicare Other | Source: Ambulatory Visit | Attending: Pulmonary Disease | Admitting: Pulmonary Disease

## 2023-01-17 DIAGNOSIS — J849 Interstitial pulmonary disease, unspecified: Secondary | ICD-10-CM | POA: Diagnosis not present

## 2023-01-17 DIAGNOSIS — R918 Other nonspecific abnormal finding of lung field: Secondary | ICD-10-CM | POA: Diagnosis not present

## 2023-01-17 DIAGNOSIS — J479 Bronchiectasis, uncomplicated: Secondary | ICD-10-CM | POA: Diagnosis not present

## 2023-01-17 DIAGNOSIS — J432 Centrilobular emphysema: Secondary | ICD-10-CM | POA: Diagnosis not present

## 2023-01-21 ENCOUNTER — Telehealth: Payer: Self-pay | Admitting: *Deleted

## 2023-01-21 DIAGNOSIS — N200 Calculus of kidney: Secondary | ICD-10-CM | POA: Diagnosis not present

## 2023-01-21 NOTE — Telephone Encounter (Signed)
Patient would like to discuss results from CT scan and would like to know if the PFT test scheduled is still needed prior to OV on 02/06/23. Please call and advise patient.

## 2023-01-24 NOTE — Telephone Encounter (Signed)
ATC patient. Unable to leave vm due to vm not being set up.  

## 2023-01-25 LAB — LAB REPORT - SCANNED: Creatinine, POC: 1101 mg/dL

## 2023-02-06 ENCOUNTER — Ambulatory Visit (INDEPENDENT_AMBULATORY_CARE_PROVIDER_SITE_OTHER): Payer: Medicare Other | Admitting: Pulmonary Disease

## 2023-02-06 ENCOUNTER — Encounter (HOSPITAL_BASED_OUTPATIENT_CLINIC_OR_DEPARTMENT_OTHER): Payer: Self-pay | Admitting: Pulmonary Disease

## 2023-02-06 VITALS — BP 134/60 | HR 72 | Temp 97.9°F | Ht 66.0 in | Wt 150.8 lb

## 2023-02-06 DIAGNOSIS — J432 Centrilobular emphysema: Secondary | ICD-10-CM | POA: Diagnosis not present

## 2023-02-06 DIAGNOSIS — J849 Interstitial pulmonary disease, unspecified: Secondary | ICD-10-CM

## 2023-02-06 LAB — PULMONARY FUNCTION TEST
DL/VA % pred: 98 %
DL/VA: 3.97 ml/min/mmHg/L
DLCO cor % pred: 84 %
DLCO cor: 17.92 ml/min/mmHg
DLCO unc % pred: 84 %
DLCO unc: 17.92 ml/min/mmHg
FEF 25-75 Post: 2.11 L/sec
FEF 25-75 Pre: 1.68 L/sec
FEF2575-%Change-Post: 25 %
FEF2575-%Pred-Post: 109 %
FEF2575-%Pred-Pre: 86 %
FEV1-%Change-Post: 4 %
FEV1-%Pred-Post: 89 %
FEV1-%Pred-Pre: 86 %
FEV1-Post: 2.21 L
FEV1-Pre: 2.12 L
FEV1FVC-%Change-Post: 2 %
FEV1FVC-%Pred-Pre: 101 %
FEV6-%Change-Post: 2 %
FEV6-%Pred-Post: 90 %
FEV6-%Pred-Pre: 88 %
FEV6-Post: 2.82 L
FEV6-Pre: 2.77 L
FEV6FVC-%Pred-Post: 104 %
FEV6FVC-%Pred-Pre: 104 %
FVC-%Change-Post: 2 %
FVC-%Pred-Post: 86 %
FVC-%Pred-Pre: 84 %
FVC-Post: 2.82 L
FVC-Pre: 2.77 L
Post FEV1/FVC ratio: 78 %
Post FEV6/FVC ratio: 100 %
Pre FEV1/FVC ratio: 77 %
Pre FEV6/FVC Ratio: 100 %
RV % pred: 98 %
RV: 2.36 L
TLC % pred: 95 %
TLC: 5.25 L

## 2023-02-06 NOTE — Patient Instructions (Signed)
Full PFT Performed Today  

## 2023-02-06 NOTE — Progress Notes (Signed)
Full PFT Performed Today  

## 2023-02-06 NOTE — Assessment & Plan Note (Signed)
We reviewed HRCT and PFTs.  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  Fortunately lung function is normal and she does not desaturate on exertion.  We reviewed the various causes of fibrosis.  She has no stigmata of collagen vascular disease but we will check serology.  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  I do not think biopsy is necessary here. We discussed natural history of this condition.  We discussed antifibrotic's and their side effect profile.  She prefers to not start medication at this time after much discussion.  She feels she will not tolerate side effects.  Hence we decided to hold off and reassess in 4 months

## 2023-02-06 NOTE — Assessment & Plan Note (Signed)
Very minimal and no airway obstruction on PFTs, no benefit of bronchodilators

## 2023-02-06 NOTE — Progress Notes (Signed)
   Subjective:    Patient ID: [name], female    DOB: [birth year], 72 y.o.   MRN: [id number]  HPI  72 year old remote smoker for FU of ILD incidentally detected on screening CT scan.   PMH -hypertension Parathyroidectomy Diabetes type 2 Asthma She smoked about 20 pack years before she quit 2013  Initial OV 11/2022 >>On ambulation oxygen saturation stayed at 98% after 3 laps  74-month follow-up visit. Reviewed PFTs today.  Dyspnea is unchanged.  She had developed cough in December which was related to an episode of bronchitis and this seems to have resolved.  She was not able to tolerate statins or is a Tamimi and this has been discontinued.  LFTs showed elevation of ALT in January to 46. 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 Also denies presyncope, palpitations, heartburn, abdominal pain, nausea, vomiting, diarrhea or change in bowel or urinary habits, dysuria,hematuria, rash, arthralgias, visual complaints, headache, numbness weakness or ataxia.     Objective:   Physical Exam  Gen. Pleasant, well-nourished, in no distress ENT - no thrush, no pallor/icterus,no post nasal drip Neck: No JVD, no thyromegaly, no carotid bruits Lungs: no use of accessory muscles, no  dullness to percussion, bibasal dry crackles Cardiovascular: Rhythm regular, heart sounds  normal, no murmurs or gallops, no peripheral edema Musculoskeletal: No deformities, no cyanosis or clubbing        Assessment & Plan:   Pulmonary nodules -decreasing in size or resolved suggesting inflammatory etiology

## 2023-02-06 NOTE — Patient Instructions (Addendum)
Suspect pulmonary fibrosis  X blood work today  We discussed medications & decided to hold off

## 2023-02-06 NOTE — Addendum Note (Signed)
Addended by: Arlyss Repress on: 02/06/2023 01:29 PM   Modules accepted: Orders

## 2023-02-11 LAB — CYCLIC CITRUL PEPTIDE ANTIBODY, IGG/IGA: Cyclic Citrullin Peptide Ab: 4 units (ref 0–19)

## 2023-02-11 LAB — COMPREHENSIVE METABOLIC PANEL
ALT: 69 IU/L — ABNORMAL HIGH (ref 0–32)
AST: 74 IU/L — ABNORMAL HIGH (ref 0–40)
Albumin/Globulin Ratio: 2.6 — ABNORMAL HIGH (ref 1.2–2.2)
Albumin: 4.5 g/dL (ref 3.8–4.8)
Alkaline Phosphatase: 70 IU/L (ref 44–121)
BUN/Creatinine Ratio: 17 (ref 12–28)
BUN: 15 mg/dL (ref 8–27)
Bilirubin Total: 0.3 mg/dL (ref 0.0–1.2)
CO2: 22 mmol/L (ref 20–29)
Calcium: 9.4 mg/dL (ref 8.7–10.3)
Chloride: 101 mmol/L (ref 96–106)
Creatinine, Ser: 0.86 mg/dL (ref 0.57–1.00)
Globulin, Total: 1.7 g/dL (ref 1.5–4.5)
Glucose: 172 mg/dL — ABNORMAL HIGH (ref 70–99)
Potassium: 4.3 mmol/L (ref 3.5–5.2)
Sodium: 139 mmol/L (ref 134–144)
Total Protein: 6.2 g/dL (ref 6.0–8.5)
eGFR: 71 mL/min/{1.73_m2} (ref >59)

## 2023-02-11 LAB — ANA+ENA+DNA/DS+SCL 70+SJOSSA/B
ANA Titer 1: NEGATIVE
ENA RNP Ab: <0.2 AI (ref 0.0–0.9)
ENA SM Ab Ser-aCnc: <0.2 AI (ref 0.0–0.9)
ENA SSA (RO) Ab: <0.2 AI (ref 0.0–0.9)
ENA SSB (LA) Ab: <0.2 AI (ref 0.0–0.9)
Scleroderma (Scl-70) (ENA) Antibody, IgG: <0.2 AI (ref 0.0–0.9)
dsDNA Ab: <1 IU/mL (ref 0–9)

## 2023-02-11 LAB — ANGIOTENSIN CONVERTING ENZYME: Angio Convert Enzyme: 78 U/L (ref 14–82)

## 2023-02-11 LAB — CK: Total CK: 34 U/L (ref 32–182)

## 2023-02-19 ENCOUNTER — Telehealth: Payer: Self-pay | Admitting: Internal Medicine

## 2023-02-19 ENCOUNTER — Other Ambulatory Visit: Payer: Self-pay

## 2023-02-19 DIAGNOSIS — R7989 Other specified abnormal findings of blood chemistry: Secondary | ICD-10-CM

## 2023-02-19 NOTE — Telephone Encounter (Signed)
Pt called in regard to labs from [facility]  Provider suggest that she get labs redone in 3 to 4 week. Wants patient to go through primary care for labs. Liver enzymes are elevated.  Patient wants a call back in regard.

## 2023-02-20 ENCOUNTER — Encounter: Payer: Self-pay | Admitting: Urology

## 2023-02-25 ENCOUNTER — Other Ambulatory Visit: Payer: Self-pay

## 2023-02-25 NOTE — Telephone Encounter (Signed)
ATC pt LVM letting her know that I already placed the order for her to have labwork re-done. My VM also stated that she can come to office at any time between 8-12 and 1-5 for collection.   NFN att.

## 2023-02-26 ENCOUNTER — Ambulatory Visit: Payer: Medicare Other | Admitting: Nurse Practitioner

## 2023-02-28 DIAGNOSIS — R7989 Other specified abnormal findings of blood chemistry: Secondary | ICD-10-CM | POA: Diagnosis not present

## 2023-03-01 LAB — HEPATIC FUNCTION PANEL
ALT: 82 IU/L — ABNORMAL HIGH (ref 0–32)
AST: 76 IU/L — ABNORMAL HIGH (ref 0–40)
Albumin: 4.3 g/dL (ref 3.8–4.8)
Alkaline Phosphatase: 79 IU/L (ref 44–121)
Bilirubin Total: 0.4 mg/dL (ref 0.0–1.2)
Bilirubin, Direct: 0.13 mg/dL (ref 0.00–0.40)
Total Protein: 6.3 g/dL (ref 6.0–8.5)

## 2023-03-04 ENCOUNTER — Encounter (INDEPENDENT_AMBULATORY_CARE_PROVIDER_SITE_OTHER): Payer: Self-pay

## 2023-03-04 ENCOUNTER — Ambulatory Visit (INDEPENDENT_AMBULATORY_CARE_PROVIDER_SITE_OTHER): Payer: Medicare Other | Admitting: Gastroenterology

## 2023-03-04 ENCOUNTER — Encounter (INDEPENDENT_AMBULATORY_CARE_PROVIDER_SITE_OTHER): Payer: Self-pay | Admitting: Gastroenterology

## 2023-03-04 VITALS — BP 139/63 | HR 76 | Temp 98.0°F | Ht 66.0 in | Wt 150.3 lb

## 2023-03-04 DIAGNOSIS — R1013 Epigastric pain: Secondary | ICD-10-CM | POA: Diagnosis not present

## 2023-03-04 DIAGNOSIS — R945 Abnormal results of liver function studies: Secondary | ICD-10-CM

## 2023-03-04 DIAGNOSIS — R7989 Other specified abnormal findings of blood chemistry: Secondary | ICD-10-CM | POA: Insufficient documentation

## 2023-03-04 DIAGNOSIS — K219 Gastro-esophageal reflux disease without esophagitis: Secondary | ICD-10-CM | POA: Diagnosis not present

## 2023-03-04 MED ORDER — OMEPRAZOLE 40 MG PO CPDR: 40.0000 mg | DELAYED_RELEASE_CAPSULE | Freq: Every day | ORAL | 1 refills | Status: DC

## 2023-03-04 MED ORDER — SUCRALFATE 1 GM/10ML PO SUSP: 1.0000 g | Freq: Three times a day (TID) | ORAL | 1 refills | Status: DC

## 2023-03-04 NOTE — H&P (View-Only) (Signed)
 Referring Provider: Patel, Rutwik K, MD Primary Care Physician:  Patel, Rutwik K, MD Primary GI Physician: Castaneda   Chief Complaint  Patient presents with   NAFLD    Follow up on NAFLD. Has concerns about epigastric pain. Sharp pain that comes and goes. Wakes up in pain and goes to bed in pain. Has some nausea. Started a couple weeks ago.    HPI:   [name] is a 71 y.o. female with past medical history of HTN, GERD, Type II DM, Hypothyroidism, HLD and parathyroid adenoma.   Patient presenting today for follow up of NAFLD and with epigastric pain and nausa   Last seen in February 2023 for suspected NAFLD, LFTs at that time were normal, RUQ with echogenic liver likely related to fatty liver. Having some nausea and indigestion at that time, on omeprazole 20mg daily. Some RLQ discomfort and bowel irregularities, looser tools on occasion. Nausea, RUQ pain at times.   Recommended weight management, mediterranean diet, continue omeprazole 20mg daily, pt to call to schedule Colonoscopy.   Last labs with AST 76, ALT 82, trending up over the past 4 months   Present:  Patient reports she had GB removed in early 2023. Recently diagnosed with IPF. Had been doing well since GB removal. She notes she is having pain in epigastric pain for 2-3 weeks. She notes that pain sometimes radiates up into her chest. Thinks eating may make it worse though sometimes she has not eaten anything and pain will occur. She has some nausea but no vomiting. She is having heartburn along with epigastric pain. She denies any weight loss. Appetite is not great. She is not on anything for her GERD. She takes 1/2 BC powder on occasion. No rectal bleeding or melena.   She notes she started on zetia for cholesterol in January but was taken off of it soon after due to intolerance to this. She has not been on any other new medications. Only took zetia x2-3 weeks starting in January. No otc supplements, other new meds or  etoh.   Last Colonoscopy:10/2022- The entire examined colon is normal.                           - Non-bleeding internal hemorrhoids.                           - No specimens collected. Last Endoscopy:20 years ago, H pylori   Recommendations:  Repeat Colonoscopy 5 years   Past Medical History:  Diagnosis Date   Arthritis    Asthma    takes Singulair nightly   Diabetes mellitus    takes levemir daily and Metformin daily   Generalized headaches    GERD (gastroesophageal reflux disease)    takes Omeprazole daily   Herniated disc, cervical    History of kidney stones    Hyperlipidemia    takes Welchol daily   Hypertension    takes Enalapril dialy   Hypothyroidism    takes Synthroid daily   IBS (irritable bowel syndrome)    IPF (idiopathic pulmonary fibrosis) (HCC) 2024   Joint pain    hands and neck   Nocturia    PONV (postoperative nausea and vomiting)    TIA (transient ischemic attack)    diagnosed by EEG but never knew she had it   Trigger finger    Weakness    Weakness    in   both hands    Past Surgical History:  Procedure Laterality Date   ABDOMINAL HYSTERECTOMY  1980   ANTERIOR CERVICAL DECOMP/DISCECTOMY FUSION N/A 10/18/2013   Procedure: Cervical three-four, Cervical four-five, Cervical seven-Thoracic one anterior cervical decompression with fusion plating and bonegraft;  Surgeon: Robert W Nudelman, MD;  Location: [facility];  Service: Neurosurgery;  Laterality: N/A;  Cervical three-four, Cervical four-five, Cervical seven-Thoracic one anterior cervical decompression with fusion plating and bonegraft   APPENDECTOMY     done with hysterectomy   BACK SURGERY  2007   lumbar fusion   carpel tunnel Bilateral    left first then right   cataract implants     CHOLECYSTECTOMY N/A 11/22/2021   Procedure: LAPAROSCOPIC CHOLECYSTECTOMY;  Surgeon: Pappayliou, Catherine A, DO;  Location: [facility];  Service: General;  Laterality: N/A;   COLONOSCOPY     COLONOSCOPY WITH  PROPOFOL N/A 11/27/2022   Procedure: COLONOSCOPY WITH PROPOFOL;  Surgeon: Castaneda Mayorga, Daniel, MD;  Location: [facility];  Service: Gastroenterology;  Laterality: N/A;  10:00am, asa 1-2   CYSTOSCOPY WITH RETROGRADE PYELOGRAM, URETEROSCOPY AND STENT PLACEMENT Left 04/08/2022   Procedure: CYSTOSCOPY WITH RETROGRADE PYELOGRAM, URETEROSCOPY AND STENT PLACEMENT;  Surgeon: Dahlstedt, Stephen, MD;  Location: [facility];  Service: Urology;  Laterality: Left;   ESOPHAGOGASTRODUODENOSCOPY     hx of h pylori   EYE SURGERY  11/18/2008   right - cataracts   EYE SURGERY  06/01/2009   left - cataracts   herniated disc   10/2000, 08/31/2008   C5-C6 then C6-C7   HOLMIUM LASER APPLICATION Left 04/08/2022   Procedure: HOLMIUM LASER APPLICATION;  Surgeon: Dahlstedt, Stephen, MD;  Location: [facility];  Service: Urology;  Laterality: Left;   IR URETERAL STENT RIGHT NEW ACCESS W/O SEP NEPHROSTOMY CATH  07/26/2021   knee anthroscopy bilateral Bilateral    KNEE ARTHROSCOPY     LITHOTRIPSY  11/12/2007   right side   NEPHROLITHOTOMY Right 07/26/2021   Procedure: NEPHROLITHOTOMY PERCUTANEOUS;  Surgeon: Dahlstedt, Stephen, MD;  Location: [facility];  Service: Urology;  Laterality: Right;   Parathyroid surgery     October 2013   PARATHYROIDECTOMY  07/02/2012   Procedure: PARATHYROIDECTOMY;  Surgeon: Todd M Gerkin, MD;  Location: [facility];  Service: General;  Laterality: N/A;   TONSILLECTOMY     as a child    Current Outpatient Medications  Medication Sig Dispense Refill   Accu-Chek Softclix Lancets lancets Use as instructed to monitor glucose once daily. 100 each 2   acetaminophen (TYLENOL) 500 MG tablet Take 500 mg by mouth every 6 (six) hours as needed.     amLODipine (NORVASC) 10 MG tablet TAKE 1 TABLET(10 MG) BY MOUTH DAILY 90 tablet 0   blood glucose meter kit and supplies KIT Dispense based on patient and insurance preference. Use twice daily (before breakfast and before bed). 1  each 0   estradiol (ESTRACE) 1 MG tablet TAKE 1 TABLET(1 MG) BY MOUTH DAILY 90 tablet 1   glucose blood (ACCU-CHEK GUIDE) test strip Use as instructed to check blood glucose twice daily, once before breakfast and once before bedtime. 200 strip 1   metFORMIN (GLUCOPHAGE-XR) 500 MG 24 hr tablet TAKE 1 TABLET(500 MG) BY MOUTH DAILY WITH BREAKFAST 90 tablet 0   metoprolol tartrate (LOPRESSOR) 25 MG tablet TAKE 1 TABLET(25 MG) BY MOUTH TWICE DAILY 180 tablet 3   thyroid (NP THYROID) 60 MG tablet TAKE 1 TABLET BY MOUTH EVERY DAY BEFORE BREAKFAST 90 tablet 3     Vitamin D, Ergocalciferol, (DRISDOL) 1.25 MG (50000 UNIT) CAPS capsule Take 1 capsule (50,000 Units total) by mouth every 7 (seven) days. 12 capsule 1   ezetimibe (ZETIA) 10 MG tablet Take 1 tablet (10 mg total) by mouth daily. (Patient not taking: Reported on 02/06/2023) 90 tablet 3   No current facility-administered medications for this visit.    Allergies as of 03/04/2023 - Review Complete 03/04/2023  Allergen Reaction Noted   Advair diskus [fluticasone-salmeterol] Anaphylaxis 05/18/2012   Codeine Nausea And Vomiting 05/18/2012   Hydrocodone Nausea And Vomiting 11/14/2021   Levothyroxine Other (See Comments) 11/14/2021   Oxycodone Nausea And Vomiting 11/14/2021   Statins  10/23/2022   Tetracyclines & related Itching 05/18/2012    Family History  Problem Relation Age of Onset   Cancer Mother        pancreatic    Social History   Socioeconomic History   Marital status: Widowed    Spouse name: Not on file   Number of children: Not on file   Years of education: Not on file   Highest education level: Not on file  Occupational History   Not on file  Tobacco Use   Smoking status: Former    Packs/day: 1.00    Years: 40.00    Additional pack years: 0.00    Total pack years: 40.00    Types: Cigarettes    Quit date: 07/19/2012    Years since quitting: 10.6    Passive exposure: Past   Smokeless tobacco: Never   Tobacco comments:     quit smoking in Oct 2013  Vaping Use   Vaping Use: Never used  Substance and Sexual Activity   Alcohol use: No    Alcohol/week: 0.0 standard drinks of alcohol   Drug use: No   Sexual activity: Not Currently    Birth control/protection: Surgical  Other Topics Concern   Not on file  Social History Narrative   Widow,husband died October 2020 after 35 years marriage.Retired.   Social Determinants of Health   Financial Resource Strain: Low Risk  (03/05/2022)   Overall Financial Resource Strain (CARDIA)    Difficulty of Paying Living Expenses: Not hard at all  Food Insecurity: No Food Insecurity (03/05/2022)   Hunger Vital Sign    Worried About Running Out of Food in the Last Year: Never true    Ran Out of Food in the Last Year: Never true  Transportation Needs: No Transportation Needs (03/05/2022)   PRAPARE - Transportation    Lack of Transportation (Medical): No    Lack of Transportation (Non-Medical): No  Physical Activity: Insufficiently Active (03/05/2022)   Exercise Vital Sign    Days of Exercise per Week: 4 days    Minutes of Exercise per Session: 30 min  Stress: No Stress Concern Present (03/05/2022)   [employer] - Occupational Stress Questionnaire    Feeling of Stress : Not at all  Social Connections: Moderately Integrated (03/05/2022)   Social Connection and Isolation Panel [NHANES]    Frequency of Communication with Friends and Family: More than three times a week    Frequency of Social Gatherings with Friends and Family: More than three times a week    Attends Religious Services: More than 4 times per year    Active Member of Clubs or Organizations: Yes    Attends Club or Organization Meetings: More than 4 times per year    Marital Status: Widowed    Review of systems General: negative for malaise,   night sweats, fever, chills, weight loss Neck: Negative for lumps, goiter, pain and significant neck swelling Resp: Negative for cough, wheezing,  dyspnea at rest CV: Negative for chest pain, leg swelling, palpitations, orthopnea GI: denies melena, hematochezia, nausea, vomiting, diarrhea, constipation, dysphagia, odyonophagia, early satiety or unintentional weight loss. +epigastric pain +nausea  MSK: Negative for joint pain or swelling, back pain, and muscle pain. Derm: Negative for itching or rash Psych: Denies depression, anxiety, memory loss, confusion. No homicidal or suicidal ideation.  Heme: Negative for prolonged bleeding, bruising easily, and swollen nodes. Endocrine: Negative for cold or heat intolerance, polyuria, polydipsia and goiter. Neuro: negative for tremor, gait imbalance, syncope and seizures. The remainder of the review of systems is noncontributory.  Physical Exam: BP 139/63 (BP Location: Left Arm, Patient Position: Sitting, Cuff Size: Normal)   Pulse 76   Temp 98 F (36.7 C) (Oral)   Ht 5' 6" (1.676 m)   Wt 150 lb 4.8 oz (68.2 kg)   BMI 24.26 kg/m  General:   Alert and oriented. No distress noted. Pleasant and cooperative.  Head:  Normocephalic and atraumatic. Eyes:  Conjuctiva clear without scleral icterus. Mouth:  Oral mucosa pink and moist. Good dentition. No lesions. Heart: Normal rate and rhythm, s1 and s2 heart sounds present.  Lungs: Clear lung sounds in all lobes. Respirations equal and unlabored. Abdomen:  +BS, soft, and non-distended. TTP of Epigastric region. No rebound or guarding. No HSM or masses noted. Derm: No palmar erythema or jaundice Msk:  Symmetrical without gross deformities. Normal posture. Extremities:  Without edema. Neurologic:  Alert and  oriented x4 Psych:  Alert and cooperative. Normal mood and affect.  Invalid input(s): "6 MONTHS"   ASSESSMENT: [name] is a 71 y.o. female presenting today for follow up of NAFLD and with epigastric pain and nausea.  Epigastric pain/nausea/GERD: history of GERD, not on PPI therapy. Epigastric pain and nausea x2-3 weeks, sometimes  worse with eating. More heartburn recently. No vomiting or weight loss. Does take 1/2 BC powder on occasion. Recommend proceeding with EGD for further evaluation as I cannot rule out PUD, gastritis, duodenitis, H pylori (history of in the past). Will restart omeprazole 40mg daily and send carafate 1g QID to help with epigastric discomfort. Indications, risks and benefits of procedure discussed in detail with patient. Patient verbalized understanding and is in agreement to proceed with EGD.  Elevated LFTs: trending up since January 2024, notably started on zetia in January but only took for a few weeks due to intolerance, certainly statins can raise aminotransferases but given time frame off these should be close to WNL now. Unclear etiology. She denies any other new meds, herbal supplements or ETOH, US of liver in January 2023 with Echogenic liver, suspected secondary to NAFLD. Will perform further serologies to rule out underlying causes of her elevated LFTs.   PLAN:  Liver serologies (ANA, AMA, ASMA, Ceruloplasmin, A1A, Iron studies, Acute Hep panel, CBC, IgG, IgM) 2. EGD-ASA II 3. Start omeprazole 40mg daily 4. Rx carafate 1g QID  All questions were answered, patient verbalized understanding and is in agreement with plan as outlined above.   Follow Up: 3 months   Amar Keenum L. Joud Ingwersen, MSN, APRN, AGNP-C Adult-Gerontology Nurse Practitioner [facility]  I have reviewed the note and agree with the APP's assessment as described in this progress note  Daniel Castaneda, MD Gastroenterology and Hepatology [facility]  

## 2023-03-04 NOTE — Progress Notes (Addendum)
Referring Provider: Anabel Halon, MD Primary Care Physician:  Anabel Halon, MD Primary GI Physician: Levon Hedger   Chief Complaint  Patient presents with   NAFLD    Follow up on NAFLD. Has concerns about epigastric pain. Sharp pain that comes and goes. Wakes up in pain and goes to bed in pain. Has some nausea. Started a couple weeks ago.    HPI:   [name] is a 72 y.o. female with past medical history of HTN, GERD, Type II DM, Hypothyroidism, HLD and parathyroid adenoma.   Patient presenting today for follow up of NAFLD and with epigastric pain and nausa   Last seen in February 2023 for suspected NAFLD, LFTs at that time were normal, RUQ with echogenic liver likely related to fatty liver. Having some nausea and indigestion at that time, on omeprazole 20mg  daily. Some RLQ discomfort and bowel irregularities, looser tools on occasion. Nausea, RUQ pain at times.   Recommended weight management, mediterranean diet, continue omeprazole 20mg  daily, pt to call to schedule Colonoscopy.   Last labs with AST 76, ALT 82, trending up over the past 4 months   Present:  Patient reports she had GB removed in early 2023. Recently diagnosed with IPF. Had been doing well since GB removal. She notes she is having pain in epigastric pain for 2-3 weeks. She notes that pain sometimes radiates up into her chest. Thinks eating may make it worse though sometimes she has not eaten anything and pain will occur. She has some nausea but no vomiting. She is having heartburn along with epigastric pain. She denies any weight loss. Appetite is not great. She is not on anything for her GERD. She takes 1/2 BC powder on occasion. No rectal bleeding or melena.   She notes she started on zetia for cholesterol in January but was taken off of it soon after due to intolerance to this. She has not been on any other new medications. Only took zetia x2-3 weeks starting in January. No otc supplements, other new meds or  etoh.   Last Colonoscopy:10/2022- The entire examined colon is normal.                           - Non-bleeding internal hemorrhoids.                           - No specimens collected. Last Endoscopy:20 years ago, H pylori   Recommendations:  Repeat Colonoscopy 5 years   Past Medical History:  Diagnosis Date   Arthritis    Asthma    takes Singulair nightly   Diabetes mellitus    takes levemir daily and Metformin daily   Generalized headaches    GERD (gastroesophageal reflux disease)    takes Omeprazole daily   Herniated disc, cervical    History of kidney stones    Hyperlipidemia    takes Welchol daily   Hypertension    takes Enalapril dialy   Hypothyroidism    takes Synthroid daily   IBS (irritable bowel syndrome)    IPF (idiopathic pulmonary fibrosis) (HCC) 2024   Joint pain    hands and neck   Nocturia    PONV (postoperative nausea and vomiting)    TIA (transient ischemic attack)    diagnosed by EEG but never knew she had it   Trigger finger    Weakness    Weakness    in  both hands    Past Surgical History:  Procedure Laterality Date   ABDOMINAL HYSTERECTOMY  1980   ANTERIOR CERVICAL DECOMP/DISCECTOMY FUSION N/A 10/18/2013   Procedure: Cervical three-four, Cervical four-five, Cervical seven-Thoracic one anterior cervical decompression with fusion plating and bonegraft;  Surgeon: Hewitt Shorts, MD;  Location: [facility];  Service: Neurosurgery;  Laterality: N/A;  Cervical three-four, Cervical four-five, Cervical seven-Thoracic one anterior cervical decompression with fusion plating and bonegraft   APPENDECTOMY     done with hysterectomy   BACK SURGERY  2007   lumbar fusion   carpel tunnel Bilateral    left first then right   cataract implants     CHOLECYSTECTOMY N/A 11/22/2021   Procedure: LAPAROSCOPIC CHOLECYSTECTOMY;  Surgeon: Lewie Chamber, DO;  Location: [facility];  Service: General;  Laterality: N/A;   COLONOSCOPY     COLONOSCOPY WITH  PROPOFOL N/A 11/27/2022   Procedure: COLONOSCOPY WITH PROPOFOL;  Surgeon: Dolores Frame, MD;  Location: [facility];  Service: Gastroenterology;  Laterality: N/A;  10:00am, asa 1-2   CYSTOSCOPY WITH RETROGRADE PYELOGRAM, URETEROSCOPY AND STENT PLACEMENT Left 04/08/2022   Procedure: CYSTOSCOPY WITH RETROGRADE PYELOGRAM, URETEROSCOPY AND STENT PLACEMENT;  Surgeon: Marcine Matar, MD;  Location: [facility];  Service: Urology;  Laterality: Left;   ESOPHAGOGASTRODUODENOSCOPY     hx of h pylori   EYE SURGERY  11/18/2008   right - cataracts   EYE SURGERY  06/01/2009   left - cataracts   herniated disc   10/2000, 08/31/2008   C5-C6 then C6-C7   HOLMIUM LASER APPLICATION Left 04/08/2022   Procedure: HOLMIUM LASER APPLICATION;  Surgeon: Marcine Matar, MD;  Location: [facility];  Service: Urology;  Laterality: Left;   IR URETERAL STENT RIGHT NEW ACCESS W/O SEP NEPHROSTOMY CATH  07/26/2021   knee anthroscopy bilateral Bilateral    KNEE ARTHROSCOPY     LITHOTRIPSY  11/12/2007   right side   NEPHROLITHOTOMY Right 07/26/2021   Procedure: NEPHROLITHOTOMY PERCUTANEOUS;  Surgeon: Marcine Matar, MD;  Location: [facility];  Service: Urology;  Laterality: Right;   Parathyroid surgery     October 2013   PARATHYROIDECTOMY  07/02/2012   Procedure: PARATHYROIDECTOMY;  Surgeon: Velora Heckler, MD;  Location: [facility];  Service: General;  Laterality: N/A;   TONSILLECTOMY     as a child    Current Outpatient Medications  Medication Sig Dispense Refill   Accu-Chek Softclix Lancets lancets Use as instructed to monitor glucose once daily. 100 each 2   acetaminophen (TYLENOL) 500 MG tablet Take 500 mg by mouth every 6 (six) hours as needed.     amLODipine (NORVASC) 10 MG tablet TAKE 1 TABLET(10 MG) BY MOUTH DAILY 90 tablet 0   blood glucose meter kit and supplies KIT Dispense based on patient and insurance preference. Use twice daily (before breakfast and before bed). 1  each 0   estradiol (ESTRACE) 1 MG tablet TAKE 1 TABLET(1 MG) BY MOUTH DAILY 90 tablet 1   glucose blood (ACCU-CHEK GUIDE) test strip Use as instructed to check blood glucose twice daily, once before breakfast and once before bedtime. 200 strip 1   metFORMIN (GLUCOPHAGE-XR) 500 MG 24 hr tablet TAKE 1 TABLET(500 MG) BY MOUTH DAILY WITH BREAKFAST 90 tablet 0   metoprolol tartrate (LOPRESSOR) 25 MG tablet TAKE 1 TABLET(25 MG) BY MOUTH TWICE DAILY 180 tablet 3   thyroid (NP THYROID) 60 MG tablet TAKE 1 TABLET BY MOUTH EVERY DAY BEFORE BREAKFAST 90 tablet 3  Vitamin D, Ergocalciferol, (DRISDOL) 1.25 MG (50000 UNIT) CAPS capsule Take 1 capsule (50,000 Units total) by mouth every 7 (seven) days. 12 capsule 1   ezetimibe (ZETIA) 10 MG tablet Take 1 tablet (10 mg total) by mouth daily. (Patient not taking: Reported on 02/06/2023) 90 tablet 3   No current facility-administered medications for this visit.    Allergies as of 03/04/2023 - Review Complete 03/04/2023  Allergen Reaction Noted   Advair diskus [fluticasone-salmeterol] Anaphylaxis 05/18/2012   Codeine Nausea And Vomiting 05/18/2012   Hydrocodone Nausea And Vomiting 11/14/2021   Levothyroxine Other (See Comments) 11/14/2021   Oxycodone Nausea And Vomiting 11/14/2021   Statins  10/23/2022   Tetracyclines & related Itching 05/18/2012    Family History  Problem Relation Age of Onset   Cancer Mother        pancreatic    Social History   Socioeconomic History   Marital status: Widowed    Spouse name: Not on file   Number of children: Not on file   Years of education: Not on file   Highest education level: Not on file  Occupational History   Not on file  Tobacco Use   Smoking status: Former    Packs/day: 1.00    Years: 40.00    Additional pack years: 0.00    Total pack years: 40.00    Types: Cigarettes    Quit date: 07/19/2012    Years since quitting: 10.6    Passive exposure: Past   Smokeless tobacco: Never   Tobacco comments:     quit smoking in 08-03-2012 Vaping Use   Vaping Use: Never used  Substance and Sexual Activity   Alcohol use: No    Alcohol/week: 0.0 standard drinks of alcohol   Drug use: No   Sexual activity: Not Currently    Birth control/protection: Surgical  Other Topics Concern   Not on file  Social History Narrative   Widow,husband died 04-Nov-2020after 35 years marriage.Retired.   Social Determinants of Health   Financial Resource Strain: Low Risk  (03/05/2022)   Overall Financial Resource Strain (CARDIA)    Difficulty of Paying Living Expenses: Not hard at all  Food Insecurity: No Food Insecurity (03/05/2022)   Hunger Vital Sign    Worried About Running Out of Food in the Last Year: Never true    Ran Out of Food in the Last Year: Never true  Transportation Needs: No Transportation Needs (03/05/2022)   PRAPARE - Administrator, Civil Service (Medical): No    Lack of Transportation (Non-Medical): No  Physical Activity: Insufficiently Active (03/05/2022)   Exercise Vital Sign    Days of Exercise per Week: 4 days    Minutes of Exercise per Session: 30 min  Stress: No Stress Concern Present (03/05/2022)   [employer] of Occupational Health - Occupational Stress Questionnaire    Feeling of Stress : Not at all  Social Connections: Moderately Integrated (03/05/2022)   Social Connection and Isolation Panel [NHANES]    Frequency of Communication with Friends and Family: More than three times a week    Frequency of Social Gatherings with Friends and Family: More than three times a week    Attends Religious Services: More than 4 times per year    Active Member of [employer] or Organizations: Yes    Attends Banker Meetings: More than 4 times per year    Marital Status: Widowed    Review of systems General: negative for malaise,  night sweats, fever, chills, weight loss Neck: Negative for lumps, goiter, pain and significant neck swelling Resp: Negative for cough, wheezing,  dyspnea at rest CV: Negative for chest pain, leg swelling, palpitations, orthopnea GI: denies melena, hematochezia, nausea, vomiting, diarrhea, constipation, dysphagia, odyonophagia, early satiety or unintentional weight loss. +epigastric pain +nausea  MSK: Negative for joint pain or swelling, back pain, and muscle pain. Derm: Negative for itching or rash Psych: Denies depression, anxiety, memory loss, confusion. No homicidal or suicidal ideation.  Heme: Negative for prolonged bleeding, bruising easily, and swollen nodes. Endocrine: Negative for cold or heat intolerance, polyuria, polydipsia and goiter. Neuro: negative for tremor, gait imbalance, syncope and seizures. The remainder of the review of systems is noncontributory.  Physical Exam: BP 139/63 (BP Location: Left Arm, Patient Position: Sitting, Cuff Size: Normal)   Pulse 76   Temp 98 F (36.7 C) (Oral)   Ht 5\' 6"  (1.676 m)   Wt 150 lb 4.8 oz (68.2 kg)   BMI 24.26 kg/m  General:   Alert and oriented. No distress noted. Pleasant and cooperative.  Head:  Normocephalic and atraumatic. Eyes:  Conjuctiva clear without scleral icterus. Mouth:  Oral mucosa pink and moist. Good dentition. No lesions. Heart: Normal rate and rhythm, s1 and s2 heart sounds present.  Lungs: Clear lung sounds in all lobes. Respirations equal and unlabored. Abdomen:  +BS, soft, and non-distended. TTP of Epigastric region. No rebound or guarding. No HSM or masses noted. Derm: No palmar erythema or jaundice Msk:  Symmetrical without gross deformities. Normal posture. Extremities:  Without edema. Neurologic:  Alert and  oriented x4 Psych:  Alert and cooperative. Normal mood and affect.  Invalid input(s): "6 MONTHS"   ASSESSMENT: [name] is a 72 y.o. female presenting today for follow up of NAFLD and with epigastric pain and nausea.  Epigastric pain/nausea/GERD: history of GERD, not on PPI therapy. Epigastric pain and nausea x2-3 weeks, sometimes  worse with eating. More heartburn recently. No vomiting or weight loss. Does take 1/2 BC powder on occasion. Recommend proceeding with EGD for further evaluation as I cannot rule out PUD, gastritis, duodenitis, H pylori (history of in the past). Will restart omeprazole 40mg  daily and send carafate 1g QID to help with epigastric discomfort. Indications, risks and benefits of procedure discussed in detail with patient. Patient verbalized understanding and is in agreement to proceed with EGD.  Elevated LFTs: trending up since January 2024, notably started on zetia in January but only took for a few weeks due to intolerance, certainly statins can raise aminotransferases but given time frame off these should be close to WNL now. Unclear etiology. She denies any other new meds, herbal supplements or ETOH, [place] of liver in January 2023 with Echogenic liver, suspected secondary to NAFLD. Will perform further serologies to rule out underlying causes of her elevated LFTs.   PLAN:  Liver serologies (ANA, AMA, ASMA, Ceruloplasmin, A1A, Iron studies, Acute Hep panel, CBC, IgG, IgM) 2. EGD-ASA II 3. Start omeprazole 40mg  daily 4. Rx carafate 1g QID  All questions were answered, patient verbalized understanding and is in agreement with plan as outlined above.   Follow Up: 3 months   Rasheeda Mulvehill L. Jeanmarie Hubert, MSN, APRN, AGNP-C Adult-Gerontology Nurse Practitioner [facility]  I have reviewed the note and agree with the APP's assessment as described in this progress note  Katrinka Blazing, MD Gastroenterology and Hepatology [facility]

## 2023-03-04 NOTE — Patient Instructions (Addendum)
We will check further labs in regards to liver We will schedule you for Upper endoscopy for further evaluation of elevated liver enzymes I am starting you back on omeprazole 40mg  daily I have sent carafate 1g for you to take prior to meals and at bedtime  Follow up 3 months  It was a pleasure to see you today. I want to create trusting relationships with patients and provide genuine, compassionate, and quality care. I truly value your feedback! please be on the lookout for a survey regarding your visit with me today. I appreciate your input about our visit and your time in completing this!    Cinde Ebert L. Jeanmarie Hubert, MSN, APRN, AGNP-C Adult-Gerontology Nurse Practitioner [facility] at [facility]

## 2023-03-05 ENCOUNTER — Other Ambulatory Visit: Payer: Self-pay | Admitting: Internal Medicine

## 2023-03-05 DIAGNOSIS — I10 Essential (primary) hypertension: Secondary | ICD-10-CM

## 2023-03-07 ENCOUNTER — Encounter: Payer: Self-pay | Admitting: Nurse Practitioner

## 2023-03-07 ENCOUNTER — Ambulatory Visit (INDEPENDENT_AMBULATORY_CARE_PROVIDER_SITE_OTHER): Payer: Medicare Other | Admitting: Nurse Practitioner

## 2023-03-07 ENCOUNTER — Ambulatory Visit (INDEPENDENT_AMBULATORY_CARE_PROVIDER_SITE_OTHER): Payer: Medicare Other | Admitting: Family Medicine

## 2023-03-07 ENCOUNTER — Encounter: Payer: Self-pay | Admitting: Family Medicine

## 2023-03-07 VITALS — BP 128/68 | HR 75 | Ht 66.0 in | Wt 150.8 lb

## 2023-03-07 VITALS — Ht 66.0 in | Wt 150.0 lb

## 2023-03-07 DIAGNOSIS — E559 Vitamin D deficiency, unspecified: Secondary | ICD-10-CM

## 2023-03-07 DIAGNOSIS — E039 Hypothyroidism, unspecified: Secondary | ICD-10-CM

## 2023-03-07 DIAGNOSIS — Z7984 Long term (current) use of oral hypoglycemic drugs: Secondary | ICD-10-CM

## 2023-03-07 DIAGNOSIS — E119 Type 2 diabetes mellitus without complications: Secondary | ICD-10-CM | POA: Diagnosis not present

## 2023-03-07 DIAGNOSIS — Z1231 Encounter for screening mammogram for malignant neoplasm of breast: Secondary | ICD-10-CM | POA: Diagnosis not present

## 2023-03-07 DIAGNOSIS — Z0000 Encounter for general adult medical examination without abnormal findings: Secondary | ICD-10-CM

## 2023-03-07 LAB — POCT GLYCOSYLATED HEMOGLOBIN (HGB A1C): Hemoglobin A1C: 8.7 % — AB (ref 4.0–5.6)

## 2023-03-07 MED ORDER — GLIPIZIDE ER 2.5 MG PO TB24: 2.5000 mg | ORAL_TABLET | Freq: Every day | ORAL | 1 refills | Status: DC

## 2023-03-07 MED ORDER — METFORMIN HCL ER 500 MG PO TB24: 500.0000 mg | ORAL_TABLET | Freq: Every day | ORAL | 1 refills | Status: DC

## 2023-03-07 NOTE — Progress Notes (Signed)
Endocrinology Follow Up Note                                         03/07/2023, 11:25 AM  Subjective:   Subjective    [name] is a 72 y.o.-year-old female patient being seen in follow up after being seen in consultation for hypothyroidism, diabetes, hx parathyroid adenoma referred by Anabel Halon, MD.   Past Medical History:  Diagnosis Date   Arthritis    Asthma    takes Singulair nightly   Diabetes mellitus    takes levemir daily and Metformin daily   Generalized headaches    GERD (gastroesophageal reflux disease)    takes Omeprazole daily   Herniated disc, cervical    History of kidney stones    Hyperlipidemia    takes Welchol daily   Hypertension    takes Enalapril dialy   Hypothyroidism    takes Synthroid daily   IBS (irritable bowel syndrome)    IPF (idiopathic pulmonary fibrosis) (HCC) 2024   Joint pain    hands and neck   Nocturia    PONV (postoperative nausea and vomiting)    TIA (transient ischemic attack)    diagnosed by EEG but never knew she had it   Trigger finger    Weakness    Weakness    in both hands    Past Surgical History:  Procedure Laterality Date   ABDOMINAL HYSTERECTOMY  1980   ANTERIOR CERVICAL DECOMP/DISCECTOMY FUSION N/A 10/18/2013   Procedure: Cervical three-four, Cervical four-five, Cervical seven-Thoracic one anterior cervical decompression with fusion plating and bonegraft;  Surgeon: Hewitt Shorts, MD;  Location: [facility];  Service: Neurosurgery;  Laterality: N/A;  Cervical three-four, Cervical four-five, Cervical seven-Thoracic one anterior cervical decompression with fusion plating and bonegraft   APPENDECTOMY     done with hysterectomy   BACK SURGERY  2007   lumbar fusion   carpel tunnel Bilateral    left first then right   cataract implants     CHOLECYSTECTOMY N/A 11/22/2021   Procedure: LAPAROSCOPIC CHOLECYSTECTOMY;  Surgeon: Lewie Chamber, DO;  Location: [facility];  Service: General;  Laterality: N/A;   COLONOSCOPY     COLONOSCOPY WITH PROPOFOL N/A 11/27/2022   Procedure: COLONOSCOPY WITH PROPOFOL;  Surgeon: Dolores Frame, MD;  Location: [facility];  Service: Gastroenterology;  Laterality: N/A;  10:00am, asa 1-2   CYSTOSCOPY WITH RETROGRADE PYELOGRAM, URETEROSCOPY AND STENT PLACEMENT Left 04/08/2022   Procedure: CYSTOSCOPY WITH RETROGRADE PYELOGRAM, URETEROSCOPY AND STENT PLACEMENT;  Surgeon: Marcine Matar, MD;  Location: [facility];  Service: Urology;  Laterality: Left;   ESOPHAGOGASTRODUODENOSCOPY     hx of h pylori   EYE SURGERY  11/18/2008   right - cataracts   EYE SURGERY  06/01/2009   left - cataracts   herniated disc   10/2000, 08/31/2008   C5-C6 then C6-C7   HOLMIUM LASER APPLICATION Left 04/08/2022   Procedure: HOLMIUM LASER APPLICATION;  Surgeon: Marcine Matar, MD;  Location: [facility];  Service: Urology;  Laterality: Left;   IR URETERAL STENT RIGHT NEW ACCESS W/O SEP NEPHROSTOMY CATH  07/26/2021   knee anthroscopy bilateral Bilateral    KNEE ARTHROSCOPY     LITHOTRIPSY  11/12/2007   right side   NEPHROLITHOTOMY Right 07/26/2021   Procedure: NEPHROLITHOTOMY PERCUTANEOUS;  Surgeon: Marcine Matar, MD;  Location: [facility];  Service: Urology;  Laterality: Right;   Parathyroid surgery     07/25/2012  PARATHYROIDECTOMY  07/02/2012   Procedure: PARATHYROIDECTOMY;  Surgeon: Velora Heckler, MD;  Location: [facility];  Service: General;  Laterality: N/A;   TONSILLECTOMY     as a child    Social History   Socioeconomic History   Marital status: Widowed    Spouse name: Not on file   Number of children: Not on file   Years of education: Not on file   Highest education level: Not on file  Occupational History   Not on file  Tobacco Use   Smoking status: Former    Packs/day: 1.00    Years: 40.00    Additional pack years: 0.00    Total pack years: 40.00     Types: Cigarettes    Quit date: 07/19/2012    Years since quitting: 10.6    Passive exposure: Past   Smokeless tobacco: Never   Tobacco comments:    quit smoking in July 25, 2012 Vaping Use   Vaping Use: Never used  Substance and Sexual Activity   Alcohol use: No    Alcohol/week: 0.0 standard drinks of alcohol   Drug use: No   Sexual activity: Not Currently    Birth control/protection: Surgical  Other Topics Concern   Not on file  Social History Narrative   Widow,husband died 10/26/20after 35 years marriage.Retired.   Social Determinants of Health   Financial Resource Strain: Low Risk  (03/05/2022)   Overall Financial Resource Strain (CARDIA)    Difficulty of Paying Living Expenses: Not hard at all  Food Insecurity: No Food Insecurity (03/05/2022)   Hunger Vital Sign    Worried About Running Out of Food in the Last Year: Never true    Ran Out of Food in the Last Year: Never true  Transportation Needs: No Transportation Needs (03/05/2022)   PRAPARE - Administrator, Civil Service (Medical): No    Lack of Transportation (Non-Medical): No  Physical Activity: Insufficiently Active (03/05/2022)   Exercise Vital Sign    Days of Exercise per Week: 4 days    Minutes of Exercise per Session: 30 min  Stress: No Stress Concern Present (03/05/2022)   [employer] of Occupational Health - Occupational Stress Questionnaire    Feeling of Stress : Not at all  Social Connections: Moderately Integrated (03/05/2022)   Social Connection and Isolation Panel [NHANES]    Frequency of Communication with Friends and Family: More than three times a week    Frequency of Social Gatherings with Friends and Family: More than three times a week    Attends Religious Services: More than 4 times per year    Active Member of [employer] or Organizations: Yes    Attends Banker Meetings: More than 4 times per year    Marital Status: Widowed    Family History  Problem Relation Age of  Onset   Cancer Mother        pancreatic    Outpatient Encounter Medications as of 03/07/2023  Medication Sig  Accu-Chek Softclix Lancets lancets Use as instructed to monitor glucose once daily.   acetaminophen (TYLENOL) 500 MG tablet Take 500 mg by mouth every 6 (six) hours as needed.   amLODipine (NORVASC) 10 MG tablet TAKE 1 TABLET(10 MG) BY MOUTH DAILY   blood glucose meter kit and supplies KIT Dispense based on patient and insurance preference. Use twice daily (before breakfast and before bed).   estradiol (ESTRACE) 1 MG tablet TAKE 1 TABLET(1 MG) BY MOUTH DAILY   glipiZIDE (GLUCOTROL XL) 2.5 MG 24 hr tablet Take 1 tablet (2.5 mg total) by mouth daily with breakfast.   glucose blood (ACCU-CHEK GUIDE) test strip Use as instructed to check blood glucose twice daily, once before breakfast and once before bedtime.   metoprolol tartrate (LOPRESSOR) 25 MG tablet TAKE 1 TABLET(25 MG) BY MOUTH TWICE DAILY   omeprazole (PRILOSEC) 40 MG capsule Take 1 capsule (40 mg total) by mouth daily.   sucralfate (CARAFATE) 1 GM/10ML suspension Take 10 mLs (1 g total) by mouth 4 (four) times daily -  with meals and at bedtime.   thyroid (NP THYROID) 60 MG tablet TAKE 1 TABLET BY MOUTH EVERY DAY BEFORE BREAKFAST   Vitamin D, Ergocalciferol, (DRISDOL) 1.25 MG (50000 UNIT) CAPS capsule Take 1 capsule (50,000 Units total) by mouth every 7 (seven) days.   [DISCONTINUED] metFORMIN (GLUCOPHAGE-XR) 500 MG 24 hr tablet TAKE 1 TABLET(500 MG) BY MOUTH DAILY WITH BREAKFAST   metFORMIN (GLUCOPHAGE-XR) 500 MG 24 hr tablet Take 1 tablet (500 mg total) by mouth daily with breakfast.   No facility-administered encounter medications on file as of 03/07/2023.    ALLERGIES: Allergies  Allergen Reactions   Advair Diskus [Fluticasone-Salmeterol] Anaphylaxis   Codeine Nausea And Vomiting   Hydrocodone Nausea And Vomiting   Levothyroxine Other (See Comments)    Caused lower leg swelling   Oxycodone Nausea And Vomiting    Statins     Muscle and joint pain   Tetracyclines & Related Itching    Internal   VACCINATION STATUS: Immunization History  Administered Date(s) Administered   Fluad Quad(high Dose 65+) 08/17/2019, 07/29/2021   Influenza Split 07/31/2013   Influenza, High Dose Seasonal PF 07/01/2017, 07/14/2020   Influenza-Unspecified 07/09/2017, 08/09/2022   Moderna Sars-Covid-2 Vaccination 11/24/2019, 12/22/2019, 08/30/2020, 02/28/2021   Tdap 07/12/2014     Diabetes She presents for her follow-up diabetic visit. She has type 2 diabetes mellitus. Her disease course has been worsening. There are no hypoglycemic associated symptoms. Pertinent negatives for hypoglycemia include no nervousness/anxiousness. Associated symptoms include fatigue. Pertinent negatives for diabetes include no weight loss. There are no hypoglycemic complications. Symptoms are stable. There are no diabetic complications. Risk factors for coronary artery disease include diabetes mellitus, stress, post-menopausal and hypertension. Current diabetic treatment includes diet and oral agent (monotherapy). She is compliant with treatment most of the time. Her weight is fluctuating minimally. She is following a generally healthy diet. Meal planning includes avoidance of concentrated sweets. She has not had a previous visit with a dietitian. She participates in exercise intermittently. Her home blood glucose trend is increasing steadily. Her breakfast blood glucose range is generally 180-200 mg/dl. (She presents today with her meter and logs showing above target fasting glycemic profile.  Her POCT A1c today is 8.7%, increasing from last visit of 7.2%.  She notes she was recently diagnosed with chronic lung disaese (not requiring any treatment at this time), will be having EGD for some GI issues soon, and has had dental problems as well.  Analysis  of her meter shows 7-day average of 196, 14-day average of 203, 14-day average of 213, 90-day average of  202. ) An ACE inhibitor/angiotensin II receptor blocker is not being taken. She does not see a podiatrist.Eye exam is current.  Thyroid Problem Presents for follow-up visit. Symptoms include depressed mood and fatigue. Patient reports no anxiety, constipation, weight gain or weight loss. The symptoms have been stable.    George Sweney Friese is a patient with the above medical history. she was diagnosed with parathyroid adenoma in 2013 which required parathyroidectomy done by Dr. Darnell Level.  She is now experiencing similar symptoms that she had at that time and would like to investigate whether or not there could be a problem with one of her other parathyroid glands.  She thinks she remembers being started on thyroid hormone replacement after her parathyroid removal due to possible injury to the thyroid gland during its removal.  She was a patient of Dr. Karilyn Cota who had started the patient on NP thyroid likely as a result of accidental injury to the thyroid tissue during parathyroidectomy surgery   she was given various doses of NP thyroid over the years, currently on 60 mg. she reports compliance to this medication:  Taking it daily on empty stomach  with water, separated by >30 minutes before breakfast and other medications, and by at least 4 hours from calcium, iron, PPIs, multivitamins .  I reviewed patient's thyroid tests:  Lab Results  Component Value Date   TSH 2.920 10/21/2022   TSH 2.300 01/23/2022   TSH 2.120 10/22/2021   TSH 2.930 07/03/2021   TSH 0.57 10/14/2019   TSH 0.15 (L) 06/29/2019   TSH 2.39 06/23/2018   TSH 2.66 04/16/2017   TSH 1.98 10/14/2016   TSH 4.37 04/04/2016   FREET4 1.13 10/21/2022   FREET4 1.26 01/23/2022   FREET4 1.26 10/22/2021   FREET4 1.23 07/03/2021   FREET4 1.4 06/23/2018   FREET4 1.4 04/16/2017   FREET4 1.5 10/14/2016   FREET4 1.1 04/04/2016   FREET4 1.5 01/04/2016   FREET4 1.15 09/29/2015    Pt denies feeling nodules in neck, hoarseness,  SOB with  lying down.  She does report intermittent trouble swallowing pills, something new.  she denies family history of thyroid disorders.  No family history of thyroid cancer.  No history of radiation therapy to head or neck.  No recent use of iodine supplements.  Denies use of Biotin containing supplements.  I reviewed her chart and she also has a history of parathyroid adenoma, controlled Diabetes without medications, GERD, HTN.   Review of systems  Constitutional: + Minimally fluctuating body weight,  current Body mass index is 24.34 kg/m. , no fatigue, no subjective hyperthermia, no subjective hypothermia Eyes: no blurry vision, no xerophthalmia ENT: no sore throat, no nodules palpated in throat, no dysphagia/odynophagia, no hoarseness Cardiovascular: no chest pain, no shortness of breath, no palpitations, no leg swelling Respiratory: no cough, no shortness of breath Gastrointestinal: no nausea/vomiting/diarrhea,+ dyspepsia (having EGD soon) Skin: no rashes, no hyperemia Neurological: no tremors, no numbness, no tingling, no dizziness Psychiatric: + depression, no anxiety   Objective:   Objective     BP 128/68 (BP Location: Left Arm, Patient Position: Sitting, Cuff Size: Large)   Pulse 75   Ht 5\' 6"  (1.676 m)   Wt 150 lb 12.8 oz (68.4 kg)   BMI 24.34 kg/m  Wt Readings from Last 3 Encounters:  03/07/23 150 lb 12.8 oz (68.4 kg)  03/04/23 150  lb 4.8 oz (68.2 kg)  02/06/23 150 lb 12.8 oz (68.4 kg)    BP Readings from Last 3 Encounters:  03/07/23 128/68  03/04/23 139/63  02/06/23 134/60       Physical Exam- Limited  Constitutional:  Body mass index is 24.34 kg/m. , not in acute distress, normal state of mind Eyes:  EOMI, no exophthalmos Musculoskeletal: no gross deformities, strength intact in all four extremities, no gross restriction of joint movements Skin:  no rashes, no hyperemia Neurological: no tremor with outstretched hands   CMP ( most recent) CMP      Component Value Date/Time   NA 139 02/06/2023 1346   K 4.3 02/06/2023 1346   CL 101 02/06/2023 1346   CO2 22 02/06/2023 1346   GLUCOSE 172 (H) 02/06/2023 1346   GLUCOSE 211 (H) 04/08/2022 0713   BUN 15 02/06/2023 1346   CREATININE 0.86 02/06/2023 1346   CREATININE 0.81 12/25/2020 1034   CALCIUM 9.4 02/06/2023 1346   PROT 6.3 02/28/2023 0915   ALBUMIN 4.3 02/28/2023 0915   AST 76 (H) 02/28/2023 0915   ALT 82 (H) 02/28/2023 0915   ALKPHOS 79 02/28/2023 0915   BILITOT 0.4 02/28/2023 0915   GFRNONAA >60 11/20/2021 0916   GFRNONAA 73 12/25/2020 1034   GFRAA 85 12/25/2020 1034     Diabetic Labs (most recent): Lab Results  Component Value Date   HGBA1C 8.7 (A) 03/07/2023   HGBA1C 7.2 (H) 10/21/2022   HGBA1C 7.1 (A) 06/25/2022   MICROALBUR 30mg /l 06/25/2022   MICROALBUR 30 07/10/2021   MICROALBUR 0.3 02/18/2018     Lipid Panel ( most recent) Lipid Panel     Component Value Date/Time   CHOL 174 10/21/2022 0837   TRIG 182 (H) 10/21/2022 0837   HDL 37 (L) 10/21/2022 0837   CHOLHDL 4.7 (H) 10/21/2022 0837   CHOLHDL 3.9 12/25/2020 1034   VLDL 33 (H) 10/14/2016 0721   LDLCALC 105 (H) 10/21/2022 0837   LDLCALC 85 12/25/2020 1034   LABVLDL 32 10/21/2022 0837       Lab Results  Component Value Date   TSH 2.920 10/21/2022   TSH 2.300 01/23/2022   TSH 2.120 10/22/2021   TSH 2.930 07/03/2021   TSH 0.57 10/14/2019   TSH 0.15 (L) 06/29/2019   TSH 2.39 06/23/2018   TSH 2.66 04/16/2017   TSH 1.98 10/14/2016   TSH 4.37 04/04/2016   FREET4 1.13 10/21/2022   FREET4 1.26 01/23/2022   FREET4 1.26 10/22/2021   FREET4 1.23 07/03/2021   FREET4 1.4 06/23/2018   FREET4 1.4 04/16/2017   FREET4 1.5 10/14/2016   FREET4 1.1 04/04/2016   FREET4 1.5 01/04/2016   FREET4 1.15 09/29/2015    US Thyroid 06/26/21 CLINICAL DATA:  History of parathyroid adenoma.   EXAM: THYROID ULTRASOUND   TECHNIQUE: Ultrasound examination of the thyroid gland and adjacent soft tissues was  performed.   COMPARISON:  04/23/2011   FINDINGS: Parenchymal Echotexture: Mildly heterogenous   Isthmus: 0.1 cm, previously 0.2 cm   Right lobe: 4.6 x 1.9 x 1.4 cm, previously 4.1 x 1.4 x 1.4 cm   Left lobe: 3.1 x 1.0 x 1.1 cm, previously 3.9 x 1.5 x 1.3 cm   _________________________________________________________   Estimated total number of nodules >/= 1 cm: 0   Number of spongiform nodules >/=  2 cm not described below (TR1): 0   Number of mixed cystic and solid nodules >/= 1.5 cm not described below (TR2): 0   _________________________________________________________  No discrete nodules. Previously there was a nodule in the left thyroid lobe that is no longer present.   IMPRESSION: Thyroid tissue is mildly heterogeneous without discrete thyroid nodules.     Electronically Signed   By: Richarda Overlie M.D.   On: 06/26/2021 11:17    Latest Reference Range & Units 07/03/21 09:18 10/22/21 08:55 01/23/22 09:05 10/21/22 08:37  TSH 0.450 - 4.500 uIU/mL 2.930 2.120 2.300 2.920  Triiodothyronine,Free,Serum 2.0 - 4.4 pg/mL 4.4     T4,Free(Direct) 0.82 - 1.77 ng/dL 1.61 0.96 0.45 4.09  Thyroperoxidase Ab SerPl-aCnc 0 - 34 IU/mL <8     Thyroglobulin Antibody 0.0 - 0.9 IU/mL <1.0        Assessment & Plan:   ASSESSMENT / PLAN:  1. History of parathyroid adenoma- s/p parathyroidectomy  -Her recent calcium levels were normal at 9.4.  She does have low vitamin D currently on replacement.  2. Hypothyroidism- unspecified  Patient with long-standing hypothyroidism, on NP thyroid 60 mg.  She historically does not tolerate Levothyroxine (causes significant ankle swelling).  - There are no recent TFTs to review.  She is advised to continue NP thyroid 60 mg po daily before breakfast.  Will recheck prior to next visit.  - We discussed about correct intake of levothyroxine, at fasting, with water, separated by at least 30 minutes from breakfast, and separated by more than 4 hours  from calcium, iron, multivitamins, acid reflux medications (PPIs). -Patient is made aware of the fact that thyroid hormone replacement is needed for life, dose to be adjusted by periodic monitoring of thyroid function tests.  3. Uncontrolled Diabetes  She presents today with her meter and logs showing above target fasting glycemic profile.  Her POCT A1c today is 8.7%, increasing from last visit of 7.2%.  She notes she was recently diagnosed with chronic lung disaese (not requiring any treatment at this time), will be having EGD for some GI issues soon, and has had dental problems as well.  Analysis of her meter shows 7-day average of 196, 14-day average of 203, 14-day average of 213, 90-day average of 202.   She is advised to continue Metformin 500 mg ER daily with breakfast.  Will initiate low dose Glipizide 2.5 mg XL daily with breakfast.  She does not have medication insurance at this time so we needed to choose something affordable.  - Nutritional counseling repeated at each appointment due to patients tendency to fall back in to old habits.  - The patient admits there is a room for improvement in their diet and drink choices. -  Suggestion is made for the patient to avoid simple carbohydrates from their diet including Cakes, Sweet Desserts / Pastries, Ice Cream, Soda (diet and regular), Sweet Tea, Candies, Chips, Cookies, Sweet Pastries, Store Bought Juices, Alcohol in Excess of 1-2 drinks a day, Artificial Sweeteners, Coffee Creamer, and "Sugar-free" Products. This will help patient to have stable blood glucose profile and potentially avoid unintended weight gain.   - I encouraged the patient to switch to unprocessed or minimally processed complex starch and increased protein intake (animal or plant source), fruits, and vegetables.   - Patient is advised to stick to a routine mealtimes to eat 3 meals a day and avoid unnecessary snacks (to snack only to correct hypoglycemia).  4. Vitamin D  deficiency Her most recent vitamin D level was 51.2 on 10/21/22.  She reportedly does not tolerate OTC vitamin D, causes upset stomach and diarrhea.  I refilled her Ergocalciferol 50000 units  PO weekly which she is tolerating well.        I spent  37  minutes in the care of the patient today including review of labs from CMP, Lipids, Thyroid Function, Hematology (current and previous including abstractions from other facilities); face-to-face time discussing  her blood glucose readings/logs, discussing hypoglycemia and hyperglycemia episodes and symptoms, medications doses, her options of short and long term treatment based on the latest standards of care / guidelines;  discussion about incorporating lifestyle medicine;  and documenting the encounter. Risk reduction counseling performed per USPSTF guidelines to reduce obesity and cardiovascular risk factors.     Please refer to Patient Instructions for Blood Glucose Monitoring and Insulin/Medications Dosing Guide"  in media tab for additional information. Please  also refer to " Patient Self Inventory" in the Media  tab for reviewed elements of pertinent patient history.  Nayah S Furgerson participated in the discussions, expressed understanding, and voiced agreement with the above plans.  All questions were answered to her satisfaction. she is encouraged to contact clinic should she have any questions or concerns prior to her return visit.   FOLLOW UP PLAN:  Return in about 3 months (around 06/07/2023) for Diabetes F/U with A1c in office, Thyroid follow up, Previsit labs, Bring meter and logs.   Ronny Bacon, [facility] [address] [place], [place] [address] Phone: [phone] Fax: [phone]  03/07/2023, 11:25 AM

## 2023-03-07 NOTE — Progress Notes (Signed)
Subjective:   [name] is a 72 y.o. female who presents for Medicare Annual (Subsequent) preventive examination.   I connected with  [name] on 03/07/23 by a audio enabled telemedicine application and verified that I am speaking with the correct person using two identifiers.  Patient Location: Home  Provider Location: Office/Clinic  I discussed the limitations of evaluation and management by telemedicine. The patient expressed understanding and agreed to proceed.  Review of Systems     Patient denies fever, chills, palpations ,shortness of breath, blurred vision,cough, abdominal pain, nausea, vomiting, headache, dizziness. Patient is not feeling nervous or anxious.    Cardiac Risk Factors include: advanced age (>1men, >11 women)     Objective:    Today's Vitals   03/07/23 1447  Weight: 150 lb (68 kg)  Height: 5\' 6"  (1.676 m)  PainSc: 4    Body mass index is 24.21 kg/m.     03/07/2023    2:50 PM 11/27/2022    8:34 AM 04/08/2022    6:51 AM 03/05/2022    1:34 PM 11/20/2021    9:18 AM 07/27/2021   11:53 AM 07/09/2021   11:17 AM  Advanced Directives  Does Patient Have a Medical Advance Directive? Yes Yes Yes Yes No Yes Yes  Type of Special educational needs teacher of [place];Living will  Healthcare Power of [place];Living will  Healthcare Power of [employer] Power of Attorney  Does patient want to make changes to medical advance directive? No - Patient declined  No - Patient declined No - Patient declined  No - Patient declined   Copy of Healthcare Power of Attorney in Chart?    Yes - validated most recent copy scanned in chart (See row information)   No - copy requested  Would patient like information on creating a medical advance directive? Yes (Inpatient - patient defers creating a medical advance directive at this time - Information given)    No - Patient declined      Current Medications (verified) Outpatient Encounter Medications as of 03/07/2023   Medication Sig   Accu-Chek Softclix Lancets lancets Use as instructed to monitor glucose once daily.   acetaminophen (TYLENOL) 500 MG tablet Take 500 mg by mouth every 6 (six) hours as needed.   amLODipine (NORVASC) 10 MG tablet TAKE 1 TABLET(10 MG) BY MOUTH DAILY   blood glucose meter kit and supplies KIT Dispense based on patient and insurance preference. Use twice daily (before breakfast and before bed).   estradiol (ESTRACE) 1 MG tablet TAKE 1 TABLET(1 MG) BY MOUTH DAILY   glipiZIDE (GLUCOTROL XL) 2.5 MG 24 hr tablet Take 1 tablet (2.5 mg total) by mouth daily with breakfast.   glucose blood (ACCU-CHEK GUIDE) test strip Use as instructed to check blood glucose twice daily, once before breakfast and once before bedtime.   metFORMIN (GLUCOPHAGE-XR) 500 MG 24 hr tablet Take 1 tablet (500 mg total) by mouth daily with breakfast.   metoprolol tartrate (LOPRESSOR) 25 MG tablet TAKE 1 TABLET(25 MG) BY MOUTH TWICE DAILY   omeprazole (PRILOSEC) 40 MG capsule Take 1 capsule (40 mg total) by mouth daily.   sucralfate (CARAFATE) 1 GM/10ML suspension Take 10 mLs (1 g total) by mouth 4 (four) times daily -  with meals and at bedtime.   thyroid (NP THYROID) 60 MG tablet TAKE 1 TABLET BY MOUTH EVERY DAY BEFORE BREAKFAST   Vitamin D, Ergocalciferol, (DRISDOL) 1.25 MG (50000 UNIT) CAPS capsule Take 1 capsule (50,000 Units total)  by mouth every 7 (seven) days.   No facility-administered encounter medications on file as of 03/07/2023.    Allergies (verified) Advair diskus [fluticasone-salmeterol], Codeine, Hydrocodone, Levothyroxine, Oxycodone, Statins, and Tetracyclines & related   History: Past Medical History:  Diagnosis Date   Arthritis    Asthma    takes Singulair nightly   Diabetes mellitus    takes levemir daily and Metformin daily   Generalized headaches    GERD (gastroesophageal reflux disease)    takes Omeprazole daily   Herniated disc, cervical    History of kidney stones     Hyperlipidemia    takes Welchol daily   Hypertension    takes Enalapril dialy   Hypothyroidism    takes Synthroid daily   IBS (irritable bowel syndrome)    IPF (idiopathic pulmonary fibrosis) (HCC) 2024   Joint pain    hands and neck   Nocturia    PONV (postoperative nausea and vomiting)    TIA (transient ischemic attack)    diagnosed by EEG but never knew she had it   Trigger finger    Weakness    Weakness    in both hands   Past Surgical History:  Procedure Laterality Date   ABDOMINAL HYSTERECTOMY  1980   ANTERIOR CERVICAL DECOMP/DISCECTOMY FUSION N/A 10/18/2013   Procedure: Cervical three-four, Cervical four-five, Cervical seven-Thoracic one anterior cervical decompression with fusion plating and bonegraft;  Surgeon: Hewitt Shorts, MD;  Location: [facility];  Service: Neurosurgery;  Laterality: N/A;  Cervical three-four, Cervical four-five, Cervical seven-Thoracic one anterior cervical decompression with fusion plating and bonegraft   APPENDECTOMY     done with hysterectomy   BACK SURGERY  2007   lumbar fusion   carpel tunnel Bilateral    left first then right   cataract implants     CHOLECYSTECTOMY N/A 11/22/2021   Procedure: LAPAROSCOPIC CHOLECYSTECTOMY;  Surgeon: Lewie Chamber, DO;  Location: [facility];  Service: General;  Laterality: N/A;   COLONOSCOPY     COLONOSCOPY WITH PROPOFOL N/A 11/27/2022   Procedure: COLONOSCOPY WITH PROPOFOL;  Surgeon: Dolores Frame, MD;  Location: [facility];  Service: Gastroenterology;  Laterality: N/A;  10:00am, asa 1-2   CYSTOSCOPY WITH RETROGRADE PYELOGRAM, URETEROSCOPY AND STENT PLACEMENT Left 04/08/2022   Procedure: CYSTOSCOPY WITH RETROGRADE PYELOGRAM, URETEROSCOPY AND STENT PLACEMENT;  Surgeon: Marcine Matar, MD;  Location[facility];  Service: Urology;  Laterality: Left;   ESOPHAGOGASTRODUODENOSCOPY     hx of h pylori   EYE SURGERY  11/18/2008   right - cataracts   EYE SURGERY  06/01/2009    left - cataracts   herniated disc   10/2000, 08/31/2008   C5-C6 then C6-C7   HOLMIUM LASER APPLICATION Left 04/08/2022   Procedure: HOLMIUM LASER APPLICATION;  Surgeon: Marcine Matar, MD;  Location: [facility];  Service: Urology;  Laterality: Left;   IR URETERAL STENT RIGHT NEW ACCESS W/O SEP NEPHROSTOMY CATH  07/26/2021   knee anthroscopy bilateral Bilateral    KNEE ARTHROSCOPY     LITHOTRIPSY  11/12/2007   right side   NEPHROLITHOTOMY Right 07/26/2021   Procedure: NEPHROLITHOTOMY PERCUTANEOUS;  Surgeon: Marcine Matar, MD;  Location: [facility];  Service: Urology;  Laterality: Right;   Parathyroid surgery     October 2013   PARATHYROIDECTOMY  07/02/2012   Procedure: PARATHYROIDECTOMY;  Surgeon: Velora Heckler, MD;  Location: [facility];  Service: General;  Laterality: N/A;   TONSILLECTOMY     as a child  Family History  Problem Relation Age of Onset   Cancer Mother        pancreatic   Social History   Socioeconomic History   Marital status: Widowed    Spouse name: Not on file   Number of children: Not on file   Years of education: Not on file   Highest education level: GED or equivalent  Occupational History   Not on file  Tobacco Use   Smoking status: Former    Packs/day: 1.00    Years: 40.00    Additional pack years: 0.00    Total pack years: 40.00    Types: Cigarettes    Quit date: 07/19/2012    Years since quitting: 10.6    Passive exposure: Past   Smokeless tobacco: Never   Tobacco comments:    quit smoking in 07/27/2012 Vaping Use   Vaping Use: Never used  Substance and Sexual Activity   Alcohol use: No    Alcohol/week: 0.0 standard drinks of alcohol   Drug use: No   Sexual activity: Not Currently    Birth control/protection: Surgical  Other Topics Concern   Not on file  Social History Narrative   Widow,husband died 10-28-20after 35 years marriage.Retired.   Social Determinants of Health   Financial Resource Strain: Low Risk   (03/07/2023)   Overall Financial Resource Strain (CARDIA)    Difficulty of Paying Living Expenses: Not hard at all  Food Insecurity: No Food Insecurity (03/07/2023)   Hunger Vital Sign    Worried About Running Out of Food in the Last Year: Never true    Ran Out of Food in the Last Year: Never true  Transportation Needs: No Transportation Needs (03/07/2023)   PRAPARE - Administrator, Civil Service (Medical): No    Lack of Transportation (Non-Medical): No  Physical Activity: Insufficiently Active (03/07/2023)   Exercise Vital Sign    Days of Exercise per Week: 2 days    Minutes of Exercise per Session: 60 min  Stress: No Stress Concern Present (03/07/2023)   [employer] of Occupational Health - Occupational Stress Questionnaire    Feeling of Stress : Only a little  Social Connections: Unknown (03/07/2023)   Social Connection and Isolation Panel [NHANES]    Frequency of Communication with Friends and Family: Twice a week    Frequency of Social Gatherings with Friends and Family: Twice a week    Attends Religious Services: More than 4 times per year    Active Member of [employer] or Organizations: Patient declined    Attends Banker Meetings: Not on file    Marital Status: Widowed    Tobacco Counseling Counseling given: Not Answered Tobacco comments: quit smoking in 07-27-2012  Clinical Intake:  Pre-visit preparation completed: No  Pain : 0-10 Pain Score: 4  Pain Type: Chronic pain     Nutritional Status: BMI of 19-24  Normal Diabetes: Yes CBG done?: No Did pt. bring in CBG monitor from home?: No  How often do you need to have someone help you when you read instructions, pamphlets, or other written materials from your doctor or pharmacy?: 1 - Never  Diabetic? Yes hemoglobin A1c 8.7  Interpreter Needed?: No      Activities of Daily Living    03/07/2023    2:50 PM 04/08/2022    6:56 AM  In your present state of health, do you have any difficulty  performing the following activities:  Hearing? 0  0  Vision? 0 0  Difficulty concentrating or making decisions? 0 0  Walking or climbing stairs? 0 0  Dressing or bathing? 0 0  Doing errands, shopping? 0   Preparing Food and eating ? N   Using the Toilet? N   In the past six months, have you accidently leaked urine? N   Do you have problems with loss of bowel control? N   Managing your Medications? N   Managing your Finances? N   Housekeeping or managing your Housekeeping? N     Patient Care Team: Anabel Halon, MD as PCP - General (Internal Medicine) Wyline Mood Dorothe Pea, MD as PCP - Cardiology (Cardiology)  Indicate any recent Medical Services you may have received from other than [facility] providers in the past year (date may be approximate).     Assessment:   This is a routine wellness examination for Brandi.  Hearing/Vision screen No results found.  Dietary issues and exercise activities discussed:   Discussed to follow a DASH diet which includes vegetables,fruits,whole grains, fat free or low fat diary,fish,poultry,beans,nuts and seeds,vegetable oils. Find an activity that you will enjoy and start to be active at least 5 days a week for 30 minutes each day.   Goals Addressed   None    Depression Screen    10/03/2022    9:24 AM 09/27/2022    9:22 AM 03/28/2022   11:19 AM 03/05/2022    1:34 PM 03/05/2022    1:32 PM 01/09/2022   10:21 AM 11/05/2021    8:09 AM  PHQ 2/9 Scores  PHQ - 2 Score 0 0 0 0 0 0 0  PHQ- 9 Score 0          Fall Risk    03/07/2023    2:50 PM 10/03/2022    9:24 AM 09/27/2022    9:21 AM 03/28/2022   11:19 AM 03/05/2022    1:34 PM  Fall Risk   Falls in the past year? 0 0 0 0 0  Number falls in past yr: 0 0 0 0 0  Injury with Fall? 0 0 0 0 0  Risk for fall due to : No Fall Risks No Fall Risks  No Fall Risks No Fall Risks  Follow up Falls evaluation completed Falls evaluation completed  Falls evaluation completed Falls evaluation completed    FALL RISK  PREVENTION PERTAINING TO THE HOME:  Any stairs in or around the home? No  If so, are there any without handrails? No  Home free of loose throw rugs in walkways, pet beds, electrical cords, etc? Yes  Adequate lighting in your home to reduce risk of falls? Yes   ASSISTIVE DEVICES UTILIZED TO PREVENT FALLS:  Life alert? No  Use of a cane, walker or w/c? No  Grab bars in the bathroom? Yes  Shower chair or bench in shower? No  Elevated toilet seat or a handicapped toilet? Yes     Cognitive Function:    03/05/2022    1:35 PM  MMSE - Mini Mental State Exam  Not completed: Unable to complete        03/07/2023    2:51 PM 03/05/2022    1:35 PM 02/28/2021   10:32 AM  6CIT Screen  What Year? 0 points 0 points 0 points  What month? 0 points 0 points 0 points  What time? 0 points 0 points 0 points  Count back from 20 0 points 0 points 0 points  Months in reverse 0  points 0 points 0 points  Repeat phrase 0 points 0 points 0 points  Total Score 0 points 0 points 0 points    Immunizations Immunization History  Administered Date(s) Administered   Fluad Quad(high Dose 65+) 08/17/2019, 07/29/2021   Influenza Split 07/31/2013   Influenza, High Dose Seasonal PF 07/01/2017, 07/14/2020   Influenza-Unspecified 07/09/2017, 08/09/2022   Moderna Sars-Covid-2 Vaccination 11/24/2019, 12/22/2019, 08/30/2020, 02/28/2021   Tdap 07/12/2014    TDAP status: Up to date  Flu Vaccine status: Up to date  Pneumococcal vaccine status: Due, Education has been provided regarding the importance of this vaccine. Advised may receive this vaccine at local pharmacy or Health Dept. Aware to provide a copy of the vaccination record if obtained from local pharmacy or Health Dept. Verbalized acceptance and understanding.  Covid-19 vaccine status: Information provided on how to obtain vaccines.   Qualifies for Shingles Vaccine? Yes   Zostavax completed No   Shingrix Completed?: No.    Education has been provided  regarding the importance of this vaccine. Patient has been advised to call insurance company to determine out of pocket expense if they have not yet received this vaccine. Advised may also receive vaccine at local pharmacy or Health Dept. Verbalized acceptance and understanding.  Screening Tests Health Maintenance  Topic Date Due   Zoster Vaccines- Shingrix (1 of 2) Never done   Medicare Annual Wellness (AWV)  03/06/2023   COVID-19 Vaccine (5 - 2023-24 season) 06/09/2023 (Originally 05/31/2022)   Pneumonia Vaccine 43+ Years old (1 of 2 - PCV) 09/28/2023 (Originally 09/20/1957)   MAMMOGRAM  03/30/2023   INFLUENZA VACCINE  05/01/2023   Diabetic kidney evaluation - Urine ACR  06/26/2023   HEMOGLOBIN A1C  09/06/2023   FOOT EXAM  09/28/2023   OPHTHALMOLOGY EXAM  12/03/2023   Lung Cancer Screening  01/17/2024   Diabetic kidney evaluation - eGFR measurement  02/06/2024   Fecal DNA (Cologuard)  03/07/2024   DTaP/Tdap/Td (2 - Td or Tdap) 07/12/2024   DEXA SCAN  Completed   Hepatitis C Screening  Completed   HPV VACCINES  Aged Out   Colonoscopy  Discontinued    Health Maintenance  Health Maintenance Due  Topic Date Due   Zoster Vaccines- Shingrix (1 of 2) Never done   Medicare Annual Wellness (AWV)  03/06/2023    Colorectal cancer screening: No longer required.   Mammogram status: Ordered 03/2023. Pt provided with contact info and advised to call to schedule appt.   Bone Density status: Completed 2018. Results reflect: Bone density results: OSTEOPOROSIS. Repeat every   years.  Lung Cancer Screening: (Low Dose CT Chest recommended if Age 68-80 years, 30 pack-year currently smoking OR have quit w/in 15years.) does qualify.   Lung Cancer Screening Referral:  Next due on 01/17/2024  Additional Screening:  Hepatitis C Screening: does not qualify; Completed Negative 03/2023  Vision Screening: Recommended annual ophthalmology exams for early detection of glaucoma and other disorders of the  eye. Is the patient up to date with their annual eye exam?  Yes  Who is the provider or what is the name of the office in which the patient attends annual eye exams?  [facility] If pt is not established with a provider, would they like to be referred to a provider to establish care? No .   Dental Screening: Recommended annual dental exams for proper oral hygiene  Community Resource Referral / Chronic Care Management: CRR required this visit?  No   CCM required this visit?  No  Plan:     I have personally reviewed and noted the following in the patient's chart:   Medical and social history Use of alcohol, tobacco or illicit drugs  Current medications and supplements including opioid prescriptions. Patient is not currently taking opioid prescriptions. Functional ability and status Nutritional status Physical activity Advanced directives List of other physicians Hospitalizations, surgeries, and ER visits in previous 12 months Vitals Screenings to include cognitive, depression, and falls Referrals and appointments  In addition, I have reviewed and discussed with patient certain preventive protocols, quality metrics, and best practice recommendations. A written personalized care plan for preventive services as well as general preventive health recommendations were provided to patient.     [address] [place], [place]   03/07/2023

## 2023-03-10 ENCOUNTER — Encounter: Payer: Self-pay | Admitting: Internal Medicine

## 2023-03-10 ENCOUNTER — Ambulatory Visit (INDEPENDENT_AMBULATORY_CARE_PROVIDER_SITE_OTHER): Payer: Medicare Other | Admitting: Internal Medicine

## 2023-03-10 ENCOUNTER — Other Ambulatory Visit: Payer: Self-pay | Admitting: Nurse Practitioner

## 2023-03-10 VITALS — BP 130/52 | HR 82 | Ht 66.0 in | Wt 150.2 lb

## 2023-03-10 DIAGNOSIS — E1169 Type 2 diabetes mellitus with other specified complication: Secondary | ICD-10-CM | POA: Diagnosis not present

## 2023-03-10 DIAGNOSIS — K760 Fatty (change of) liver, not elsewhere classified: Secondary | ICD-10-CM

## 2023-03-10 DIAGNOSIS — R7989 Other specified abnormal findings of blood chemistry: Secondary | ICD-10-CM | POA: Diagnosis not present

## 2023-03-10 DIAGNOSIS — K219 Gastro-esophageal reflux disease without esophagitis: Secondary | ICD-10-CM | POA: Diagnosis not present

## 2023-03-10 DIAGNOSIS — J432 Centrilobular emphysema: Secondary | ICD-10-CM | POA: Diagnosis not present

## 2023-03-10 DIAGNOSIS — I10 Essential (primary) hypertension: Secondary | ICD-10-CM | POA: Diagnosis not present

## 2023-03-10 DIAGNOSIS — I700 Atherosclerosis of aorta: Secondary | ICD-10-CM | POA: Diagnosis not present

## 2023-03-10 DIAGNOSIS — J849 Interstitial pulmonary disease, unspecified: Secondary | ICD-10-CM | POA: Diagnosis not present

## 2023-03-10 DIAGNOSIS — E039 Hypothyroidism, unspecified: Secondary | ICD-10-CM | POA: Diagnosis not present

## 2023-03-10 LAB — ANA: Anti Nuclear Antibody (ANA): NEGATIVE

## 2023-03-10 LAB — HEPATITIS PANEL, ACUTE
Hep A IgM: NONREACTIVE
Hep B C IgM: NONREACTIVE
Hepatitis B Surface Ag: NONREACTIVE
Hepatitis C Ab: NONREACTIVE

## 2023-03-10 LAB — ALPHA-1-ANTITRYPSIN: A-1 Antitrypsin, Ser: 174 mg/dL (ref 83–199)

## 2023-03-10 LAB — CBC
HCT: 43.9 % (ref 35.0–45.0)
Hemoglobin: 14.6 g/dL (ref 11.7–15.5)
MCH: 28.2 pg (ref 27.0–33.0)
MCHC: 33.3 g/dL (ref 32.0–36.0)
MCV: 84.7 fL (ref 80.0–100.0)
MPV: 9.5 fL (ref 7.5–12.5)
Platelets: 231 10*3/uL (ref 140–400)
RBC: 5.18 10*6/uL — ABNORMAL HIGH (ref 3.80–5.10)
RDW: 12.8 % (ref 11.0–15.0)
WBC: 5.2 10*3/uL (ref 3.8–10.8)

## 2023-03-10 LAB — MITOCHONDRIAL ANTIBODIES: Mitochondrial M2 Ab, IgG: <=20 U (ref <=20.0)

## 2023-03-10 LAB — CERULOPLASMIN: Ceruloplasmin: 32 mg/dL (ref 14–48)

## 2023-03-10 LAB — ANTI-SMOOTH MUSCLE ANTIBODY, IGG: Actin (Smooth Muscle) Antibody (IGG): <20 U (ref <20)

## 2023-03-10 LAB — IRON, TOTAL/TOTAL IRON BINDING CAP
%SAT: 18 % (calc) (ref 16–45)
Iron: 77 ug/dL (ref 45–160)
TIBC: 429 mcg/dL (calc) (ref 250–450)

## 2023-03-10 LAB — IGG, IGA, IGM
IgG (Immunoglobin G), Serum: 542 mg/dL — ABNORMAL LOW (ref 600–1540)
IgM, Serum: 55 mg/dL (ref 50–300)
Immunoglobulin A: 94 mg/dL (ref 70–320)

## 2023-03-10 NOTE — Assessment & Plan Note (Signed)
On NP thyroid Chart review suggests normal TSH Plan to down titrate NP thyroid and switch to Levothyroxine if abnormal TSH -followed by Endocrinology 

## 2023-03-10 NOTE — Assessment & Plan Note (Signed)
Better controlled with omeprazole and Carafate now

## 2023-03-10 NOTE — Assessment & Plan Note (Signed)
Recently elevated liver enzymes could be due to NAFLD Needs to continue to follow low-carb diet Followed by GI May need [place] elastography Advised to cut down estradiol as it could also lead to elevated liver enzymes

## 2023-03-10 NOTE — Assessment & Plan Note (Signed)
Noted on CT chest BP WNL Not on statin due to myalgias and transaminasemia

## 2023-03-10 NOTE — Progress Notes (Signed)
Established Patient Office Visit  Subjective:  Patient ID: [name], female    DOB: 12-25-1950  Age: 72 y.o. MRN: [id number]  CC:  Chief Complaint  Patient presents with   Elevated Hepatic Enzymes    Patient is concerned for elevated liver enzymes    HPI [name] is a 72 y.o. female with past medical history of HTN, GERD, type II DM, hypothyroidism, HLD and parathyroid adenoma who presents for f/u of her chronic medical conditions.  HTN: BP is well-controlled. Takes medications regularly. Patient denies headache, dizziness, chest pain, dyspnea or palpitations.  She is still on NP thyroid for hypothyroidism.  Followed by endocrinology.  NAFLD: She was recently found to have elevated liver enzymes.  She also complained of epigastric pain.  She was seen by GI, and was placed on omeprazole and Carafate, which have improved her symptoms.  She still has intermittent epigastric pain, but denies any nausea or vomiting.  Her [place] of abdomen 01/23 showed hepatic steatosis.  Nephrolithiasis: She had get cystoscopy with stent placement in 07/23. She denies any gross hematuria currently.  Denies any fever, chills, nausea, vomiting or urinary hesitancy.  She is currently taking estradiol for postmenopausal symptoms.  She had tried to come off of estradiol in the past, but had mood symptoms without it.  Considering her recently elevated liver enzymes, she agrees to cut down dose of estradiol.  Past Medical History:  Diagnosis Date   Arthritis    Asthma    takes Singulair nightly   Diabetes mellitus    takes levemir daily and Metformin daily   Generalized headaches    GERD (gastroesophageal reflux disease)    takes Omeprazole daily   Herniated disc, cervical    History of kidney stones    Hyperlipidemia    takes Welchol daily   Hypertension    takes Enalapril dialy   Hypothyroidism    takes Synthroid daily   IBS (irritable bowel syndrome)    IPF (idiopathic pulmonary  fibrosis) (HCC) 2024   Joint pain    hands and neck   Nocturia    PONV (postoperative nausea and vomiting)    TIA (transient ischemic attack)    diagnosed by EEG but never knew she had it   Trigger finger    Weakness    Weakness    in both hands    Past Surgical History:  Procedure Laterality Date   ABDOMINAL HYSTERECTOMY  1980   ANTERIOR CERVICAL DECOMP/DISCECTOMY FUSION N/A 10/18/2013   Procedure: Cervical three-four, Cervical four-five, Cervical seven-Thoracic one anterior cervical decompression with fusion plating and bonegraft;  Surgeon: Hewitt Shorts, MD;  Location: [facility];  Service: Neurosurgery;  Laterality: N/A;  Cervical three-four, Cervical four-five, Cervical seven-Thoracic one anterior cervical decompression with fusion plating and bonegraft   APPENDECTOMY     done with hysterectomy   BACK SURGERY  2007   lumbar fusion   carpel tunnel Bilateral    left first then right   cataract implants     CHOLECYSTECTOMY N/A 11/22/2021   Procedure: LAPAROSCOPIC CHOLECYSTECTOMY;  Surgeon: Lewie Chamber, DO;  Location: [facility];  Service: General;  Laterality: N/A;   COLONOSCOPY     COLONOSCOPY WITH PROPOFOL N/A 11/27/2022   Procedure: COLONOSCOPY WITH PROPOFOL;  Surgeon: Dolores Frame, MD;  Location: [facility];  Service: Gastroenterology;  Laterality: N/A;  10:00am, asa 1-2   CYSTOSCOPY WITH RETROGRADE PYELOGRAM, URETEROSCOPY AND STENT PLACEMENT Left 04/08/2022   Procedure: CYSTOSCOPY  WITH RETROGRADE PYELOGRAM, URETEROSCOPY AND STENT PLACEMENT;  Surgeon: Marcine Matar, MD;  Location: [facility];  Service: Urology;  Laterality: Left;   ESOPHAGOGASTRODUODENOSCOPY     hx of h pylori   EYE SURGERY  11/18/2008   right - cataracts   EYE SURGERY  06/01/2009   left - cataracts   herniated disc   10/2000, 08/31/2008   C5-C6 then C6-C7   HOLMIUM LASER APPLICATION Left 04/08/2022   Procedure: HOLMIUM LASER APPLICATION;  Surgeon: Marcine Matar, MD;  Location: [facility];  Service: Urology;  Laterality: Left;   IR URETERAL STENT RIGHT NEW ACCESS W/O SEP NEPHROSTOMY CATH  07/26/2021   knee anthroscopy bilateral Bilateral    KNEE ARTHROSCOPY     LITHOTRIPSY  11/12/2007   right side   NEPHROLITHOTOMY Right 07/26/2021   Procedure: NEPHROLITHOTOMY PERCUTANEOUS;  Surgeon: Marcine Matar, MD;  Location: [facility];  Service: Urology;  Laterality: Right;   Parathyroid surgery     2012-08-08  PARATHYROIDECTOMY  07/02/2012   Procedure: PARATHYROIDECTOMY;  Surgeon: Velora Heckler, MD;  Location: [facility];  Service: General;  Laterality: N/A;   TONSILLECTOMY     as a child    Family History  Problem Relation Age of Onset   Cancer Mother        pancreatic    Social History   Socioeconomic History   Marital status: Widowed    Spouse name: Not on file   Number of children: Not on file   Years of education: Not on file   Highest education level: GED or equivalent  Occupational History   Not on file  Tobacco Use   Smoking status: Former    Packs/day: 1.00    Years: 40.00    Additional pack years: 0.00    Total pack years: 40.00    Types: Cigarettes    Quit date: 07/19/2012    Years since quitting: 10.6    Passive exposure: Past   Smokeless tobacco: Never   Tobacco comments:    quit smoking in 08-08-12 Vaping Use   Vaping Use: Never used  Substance and Sexual Activity   Alcohol use: No    Alcohol/week: 0.0 standard drinks of alcohol   Drug use: No   Sexual activity: Not Currently    Birth control/protection: Surgical  Other Topics Concern   Not on file  Social History Narrative   Widow,husband died November 09, 2020after 35 years marriage.Retired.   Social Determinants of Health   Financial Resource Strain: Low Risk  (03/07/2023)   Overall Financial Resource Strain (CARDIA)    Difficulty of Paying Living Expenses: Not hard at all  Food Insecurity: No Food Insecurity (03/07/2023)   Hunger Vital  Sign    Worried About Running Out of Food in the Last Year: Never true    Ran Out of Food in the Last Year: Never true  Transportation Needs: No Transportation Needs (03/07/2023)   PRAPARE - Administrator, Civil Service (Medical): No    Lack of Transportation (Non-Medical): No  Physical Activity: Insufficiently Active (03/07/2023)   Exercise Vital Sign    Days of Exercise per Week: 2 days    Minutes of Exercise per Session: 60 min  Stress: No Stress Concern Present (03/07/2023)   [employer] of Occupational Health - Occupational Stress Questionnaire    Feeling of Stress : Only a little  Social Connections: Unknown (03/07/2023)   Social Connection and Isolation Panel [NHANES]  Frequency of Communication with Friends and Family: Twice a week    Frequency of Social Gatherings with Friends and Family: Twice a week    Attends Religious Services: More than 4 times per year    Active Member of [employer] or Organizations: Patient declined    Attends Banker Meetings: Not on file    Marital Status: Widowed  Intimate Partner Violence: Not At Risk (03/05/2022)   Humiliation, Afraid, Rape, and Kick questionnaire    Fear of Current or Ex-Partner: No    Emotionally Abused: No    Physically Abused: No    Sexually Abused: No    Outpatient Medications Prior to Visit  Medication Sig Dispense Refill   Accu-Chek Softclix Lancets lancets Use as instructed to monitor glucose once daily. 100 each 2   acetaminophen (TYLENOL) 500 MG tablet Take 500 mg by mouth every 6 (six) hours as needed.     amLODipine (NORVASC) 10 MG tablet TAKE 1 TABLET(10 MG) BY MOUTH DAILY 90 tablet 0   blood glucose meter kit and supplies KIT Dispense based on patient and insurance preference. Use twice daily (before breakfast and before bed). 1 each 0   estradiol (ESTRACE) 1 MG tablet TAKE 1 TABLET(1 MG) BY MOUTH DAILY 90 tablet 1   glipiZIDE (GLUCOTROL XL) 2.5 MG 24 hr tablet Take 1 tablet (2.5 mg total) by  mouth daily with breakfast. 90 tablet 1   metFORMIN (GLUCOPHAGE-XR) 500 MG 24 hr tablet Take 1 tablet (500 mg total) by mouth daily with breakfast. 90 tablet 1   metoprolol tartrate (LOPRESSOR) 25 MG tablet TAKE 1 TABLET(25 MG) BY MOUTH TWICE DAILY 180 tablet 3   omeprazole (PRILOSEC) 40 MG capsule Take 1 capsule (40 mg total) by mouth daily. 60 capsule 1   sucralfate (CARAFATE) 1 GM/10ML suspension Take 10 mLs (1 g total) by mouth 4 (four) times daily -  with meals and at bedtime. 420 mL 1   thyroid (NP THYROID) 60 MG tablet TAKE 1 TABLET BY MOUTH EVERY DAY BEFORE BREAKFAST 90 tablet 3   Vitamin D, Ergocalciferol, (DRISDOL) 1.25 MG (50000 UNIT) CAPS capsule Take 1 capsule (50,000 Units total) by mouth every 7 (seven) days. 12 capsule 1   glucose blood (ACCU-CHEK GUIDE) test strip Use as instructed to check blood glucose twice daily, once before breakfast and once before bedtime. 200 strip 1   No facility-administered medications prior to visit.    Allergies  Allergen Reactions   Advair Diskus [Fluticasone-Salmeterol] Anaphylaxis   Codeine Nausea And Vomiting   Hydrocodone Nausea And Vomiting   Levothyroxine Other (See Comments)    Caused lower leg swelling   Oxycodone Nausea And Vomiting   Statins     Muscle and joint pain   Tetracyclines & Related Itching    Internal    ROS Review of Systems  Constitutional:  Negative for chills and fever.  HENT:  Negative for congestion, sinus pressure, sinus pain and sore throat.   Eyes:  Negative for pain and discharge.  Respiratory:  Negative for cough and shortness of breath.   Cardiovascular:  Negative for chest pain and palpitations.  Gastrointestinal:  Negative for diarrhea, nausea and vomiting.  Endocrine: Negative for polydipsia and polyuria.  Genitourinary:  Negative for dysuria and hematuria.  Musculoskeletal:  Positive for arthralgias and myalgias. Negative for neck pain and neck stiffness.  Skin:  Negative for rash.   Neurological:  Negative for dizziness and weakness.  Psychiatric/Behavioral:  Negative for agitation and behavioral problems.  Objective:    Physical Exam Vitals reviewed.  Constitutional:      General: She is not in acute distress.    Appearance: She is not diaphoretic.  HENT:     Head: Normocephalic and atraumatic.     Nose: Nose normal.     Mouth/Throat:     Mouth: Mucous membranes are moist.  Eyes:     General: No scleral icterus.    Extraocular Movements: Extraocular movements intact.  Neck:     Comments: Scar noted, site C/D/I Cardiovascular:     Rate and Rhythm: Normal rate and regular rhythm.     Pulses: Normal pulses.     Heart sounds: Normal heart sounds. No murmur heard. Pulmonary:     Breath sounds: Normal breath sounds. No wheezing or rales.  Musculoskeletal:     Cervical back: Neck supple. No tenderness.     Right lower leg: No edema.     Left lower leg: No edema.  Skin:    General: Skin is warm.     Findings: No rash.  Neurological:     General: No focal deficit present.     Mental Status: She is alert and oriented to person, place, and time.  Psychiatric:        Mood and Affect: Mood normal.        Behavior: Behavior normal.     BP (!) 130/52 (BP Location: Left Arm)   Pulse 82   Ht 5\' 6"  (1.676 m)   Wt 150 lb 3.2 oz (68.1 kg)   SpO2 96%   BMI 24.24 kg/m  Wt Readings from Last 3 Encounters:  03/10/23 150 lb 3.2 oz (68.1 kg)  03/07/23 150 lb (68 kg)  03/07/23 150 lb 12.8 oz (68.4 kg)    Lab Results  Component Value Date   TSH 2.920 10/21/2022   Lab Results  Component Value Date   WBC 5.2 03/04/2023   HGB 14.6 03/04/2023   HCT 43.9 03/04/2023   MCV 84.7 03/04/2023   PLT 231 03/04/2023   Lab Results  Component Value Date   NA 139 02/06/2023   K 4.3 02/06/2023   CO2 22 02/06/2023   GLUCOSE 172 (H) 02/06/2023   BUN 15 02/06/2023   CREATININE 0.86 02/06/2023   BILITOT 0.4 02/28/2023   ALKPHOS 79 02/28/2023   AST 76 (H)  02/28/2023   ALT 82 (H) 02/28/2023   PROT 6.3 02/28/2023   ALBUMIN 4.3 02/28/2023   CALCIUM 9.4 02/06/2023   ANIONGAP 7 11/20/2021   EGFR 71 02/06/2023   Lab Results  Component Value Date   CHOL 174 10/21/2022   Lab Results  Component Value Date   HDL 37 (L) 10/21/2022   Lab Results  Component Value Date   LDLCALC 105 (H) 10/21/2022   Lab Results  Component Value Date   TRIG 182 (H) 10/21/2022   Lab Results  Component Value Date   CHOLHDL 4.7 (H) 10/21/2022   Lab Results  Component Value Date   HGBA1C 8.7 (A) 03/07/2023      Assessment & Plan:   Problem List Items Addressed This Visit       Cardiovascular and Mediastinum   Essential hypertension    BP Readings from Last 1 Encounters:  03/10/23 (!) 130/52  Overall remains well-controlled with Amlodipine and Metoprolol Counseled for compliance with the medications Advised DASH diet and moderate exercise/walking, at least 150 mins/week      Atherosclerosis of aorta (HCC)    Noted on CT  chest BP WNL Not on statin due to myalgias and transaminasemia        Respiratory   Interstitial lung disease (HCC)    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 Followed by pulmonology      Pulmonary emphysema (HCC)    Noted on CT chest Has quit smoking Did not have benefit of bronchodilators noted on PFTs        Digestive   GERD (gastroesophageal reflux disease)    Better controlled with omeprazole and Carafate now      NAFLD (nonalcoholic fatty liver disease) - Primary    Recently elevated liver enzymes could be due to NAFLD Needs to continue to follow low-carb diet Followed by GI May need [place] elastography Advised to cut down estradiol as it could also lead to elevated liver enzymes        Endocrine   Type 2 diabetes mellitus with other specified complication (HCC)    Lab Results  Component Value Date   HGBA1C 8.7 (A) 03/07/2023  Uncontrolled Associated with HLD and HTN Was diet controlled, recently worse -  on Metformin 500 mg QD and glipizide 2.5 mg QD Followed by endocrinology Advised to follow strict low-carb diet for now Advised to follow up with Ophthalmology for diabetic eye exam      Primary hypothyroidism    On NP thyroid Chart review suggests normal TSH Plan to down titrate NP thyroid and switch to Levothyroxine if abnormal TSH -followed by Endocrinology        Other   Elevated LFTs    No orders of the defined types were placed in this encounter.   Follow-up: Return in about 6 months (around 09/09/2023) for DM and NAFLD.    Anabel Halon, MD

## 2023-03-10 NOTE — Assessment & Plan Note (Signed)
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

## 2023-03-10 NOTE — Assessment & Plan Note (Signed)
Lab Results  Component Value Date   HGBA1C 8.7 (A) 03/07/2023   Uncontrolled Associated with HLD and HTN Was diet controlled, recently worse - on Metformin 500 mg QD and glipizide 2.5 mg QD Followed by endocrinology Advised to follow strict low-carb diet for now Advised to follow up with Ophthalmology for diabetic eye exam

## 2023-03-10 NOTE — Assessment & Plan Note (Addendum)
Noted on CT chest Has quit smoking Did not have benefit of bronchodilators noted on PFTs

## 2023-03-10 NOTE — Assessment & Plan Note (Signed)
BP Readings from Last 1 Encounters:  03/10/23 (!) 130/52   Overall remains well-controlled with Amlodipine and Metoprolol Counseled for compliance with the medications Advised DASH diet and moderate exercise/walking, at least 150 mins/week

## 2023-03-10 NOTE — Patient Instructions (Signed)
Please start taking half tablet of Estradiol for now.  Please take Carafate twice daily before meals for now.  Please continue to take medications as prescribed.  Please continue to follow low carb diet and perform moderate exercise/walking at least 150 mins/week.

## 2023-03-19 ENCOUNTER — Encounter (HOSPITAL_COMMUNITY)
Admission: RE | Disposition: A | Discharge status: Home / Self Care | Payer: Self-pay | Source: Home / Self Care | Attending: Gastroenterology

## 2023-03-19 ENCOUNTER — Ambulatory Visit (HOSPITAL_BASED_OUTPATIENT_CLINIC_OR_DEPARTMENT_OTHER): Payer: Medicare Other | Admitting: Anesthesiology

## 2023-03-19 ENCOUNTER — Ambulatory Visit (HOSPITAL_COMMUNITY): Payer: Medicare Other | Admitting: Anesthesiology

## 2023-03-19 ENCOUNTER — Encounter (HOSPITAL_COMMUNITY): Payer: Self-pay | Admitting: Gastroenterology

## 2023-03-19 ENCOUNTER — Ambulatory Visit (HOSPITAL_COMMUNITY)
Admission: RE | Admit: 2023-03-19 | Discharge: 2023-03-19 | Disposition: A | Discharge status: Home / Self Care | Payer: Medicare Other | Attending: Gastroenterology | Admitting: Gastroenterology

## 2023-03-19 ENCOUNTER — Other Ambulatory Visit: Payer: Self-pay

## 2023-03-19 DIAGNOSIS — J45909 Unspecified asthma, uncomplicated: Secondary | ICD-10-CM | POA: Diagnosis not present

## 2023-03-19 DIAGNOSIS — K760 Fatty (change of) liver, not elsewhere classified: Secondary | ICD-10-CM | POA: Diagnosis not present

## 2023-03-19 DIAGNOSIS — Z8711 Personal history of peptic ulcer disease: Secondary | ICD-10-CM | POA: Diagnosis not present

## 2023-03-19 DIAGNOSIS — K449 Diaphragmatic hernia without obstruction or gangrene: Secondary | ICD-10-CM | POA: Insufficient documentation

## 2023-03-19 DIAGNOSIS — K2970 Gastritis, unspecified, without bleeding: Secondary | ICD-10-CM

## 2023-03-19 DIAGNOSIS — K219 Gastro-esophageal reflux disease without esophagitis: Secondary | ICD-10-CM | POA: Diagnosis not present

## 2023-03-19 DIAGNOSIS — R1013 Epigastric pain: Secondary | ICD-10-CM

## 2023-03-19 DIAGNOSIS — R110 Nausea: Secondary | ICD-10-CM | POA: Diagnosis not present

## 2023-03-19 DIAGNOSIS — J449 Chronic obstructive pulmonary disease, unspecified: Secondary | ICD-10-CM

## 2023-03-19 DIAGNOSIS — Z87891 Personal history of nicotine dependence: Secondary | ICD-10-CM | POA: Insufficient documentation

## 2023-03-19 DIAGNOSIS — Z79899 Other long term (current) drug therapy: Secondary | ICD-10-CM | POA: Diagnosis not present

## 2023-03-19 DIAGNOSIS — K2950 Unspecified chronic gastritis without bleeding: Secondary | ICD-10-CM | POA: Diagnosis not present

## 2023-03-19 HISTORY — PX: BIOPSY: SHX5522

## 2023-03-19 HISTORY — PX: ESOPHAGOGASTRODUODENOSCOPY (EGD) WITH PROPOFOL: SHX5813

## 2023-03-19 LAB — HM COLONOSCOPY

## 2023-03-19 LAB — GLUCOSE, CAPILLARY: Glucose-Capillary: 202 mg/dL — ABNORMAL HIGH (ref 70–99)

## 2023-03-19 SURGERY — ESOPHAGOGASTRODUODENOSCOPY (EGD) WITH PROPOFOL
Anesthesia: General

## 2023-03-19 MED ORDER — LACTATED RINGERS IV SOLN: INTRAVENOUS | Status: DC

## 2023-03-19 MED ORDER — PROPOFOL 10 MG/ML IV BOLUS
INTRAVENOUS | Status: DC | PRN
  Administered 2023-03-19: 80 mg via INTRAVENOUS

## 2023-03-19 MED ORDER — PROPOFOL 500 MG/50ML IV EMUL
INTRAVENOUS | Status: AC
  Filled 2023-03-19: qty 50

## 2023-03-19 MED ORDER — LIDOCAINE HCL (PF) 2 % IJ SOLN
INTRAMUSCULAR | Status: AC
  Filled 2023-03-19: qty 5

## 2023-03-19 MED ORDER — LIDOCAINE HCL (CARDIAC) PF 100 MG/5ML IV SOSY
PREFILLED_SYRINGE | INTRAVENOUS | Status: DC | PRN
  Administered 2023-03-19: 80 mg via INTRAVENOUS

## 2023-03-19 MED ORDER — PROPOFOL 500 MG/50ML IV EMUL
INTRAVENOUS | Status: DC | PRN
  Administered 2023-03-19: 150 ug/kg/min via INTRAVENOUS

## 2023-03-19 NOTE — Discharge Instructions (Addendum)
You are being discharged home Resume your previous diet We are waiting for your pathology results Continue your current medications

## 2023-03-19 NOTE — Transfer of Care (Signed)
Immediate Anesthesia Transfer of Care Note  Patient: [name]  Procedure(s) Performed: ESOPHAGOGASTRODUODENOSCOPY (EGD) WITH PROPOFOL BIOPSY  Patient Location: PACU  Anesthesia Type:General  Level of Consciousness: drowsy and patient cooperative  Airway & Oxygen Therapy: Patient Spontanous Breathing  Post-op Assessment: Report given to RN and Post -op Vital signs reviewed and stable  Post vital signs: Reviewed and stable  Last Vitals:  Vitals Value Taken Time  BP    Temp    Pulse    Resp    SpO2      Last Pain:  Vitals:   03/19/23 1116  TempSrc:   PainSc: 0-No pain      Patients Stated Pain Goal: 6 (03/19/23 0914)  Complications: No notable events documented.

## 2023-03-19 NOTE — Interval H&P Note (Signed)
History and Physical Interval Note:  03/19/2023 9:53 AM  [name]  has presented today for surgery, with the diagnosis of Epigastric Pain, Nausea.  The various methods of treatment have been discussed with the patient and family. After consideration of risks, benefits and other options for treatment, the patient has consented to  Procedure(s) with comments: ESOPHAGOGASTRODUODENOSCOPY (EGD) WITH PROPOFOL (N/A) - 10:30am;asa 2 as a surgical intervention.  The patient's history has been reviewed, patient examined, no change in status, stable for surgery.  I have reviewed the patient's chart and labs.  Questions were answered to the patient's satisfaction.     Katrinka Blazing Mayorga

## 2023-03-19 NOTE — Anesthesia Preprocedure Evaluation (Addendum)
Anesthesia Evaluation  Patient identified by MRN, date of birth, ID band Patient awake    Reviewed: Allergy & Precautions, H&P , NPO status , Patient's Chart, lab work & pertinent test results, reviewed documented beta blocker date and time   History of Anesthesia Complications (+) PONV and history of anesthetic complications  Airway Mallampati: II  TM Distance: >3 FB Neck ROM: Full   Comment: Neck sx Dental  (+) Dental Advisory Given, Partial Upper, Missing   Pulmonary asthma , pneumonia, resolved, COPD,  COPD inhaler, former smoker   Pulmonary exam normal breath sounds clear to auscultation       Cardiovascular Exercise Tolerance: Good hypertension, Pt. on medications and Pt. on home beta blockers Normal cardiovascular exam Rhythm:Regular Rate:Normal     Neuro/Psych  Headaches TIA negative psych ROS   GI/Hepatic Neg liver ROS,GERD  Medicated and Controlled,,  Endo/Other  diabetes, Well Controlled, Type 2, Oral Hypoglycemic AgentsHypothyroidism    Renal/GU Renal disease  negative genitourinary   Musculoskeletal  (+) Arthritis , Osteoarthritis,    Abdominal   Peds negative pediatric ROS (+)  Hematology negative hematology ROS (+)   Anesthesia Other Findings   Reproductive/Obstetrics negative OB ROS                             Anesthesia Physical Anesthesia Plan  ASA: 2  Anesthesia Plan: General   Post-op Pain Management: Minimal or no pain anticipated   Induction: Intravenous  PONV Risk Score and Plan: Propofol infusion  Airway Management Planned: Nasal Cannula and Natural Airway  Additional Equipment:   Intra-op Plan:   Post-operative Plan:   Informed Consent: I have reviewed the patients History and Physical, chart, labs and discussed the procedure including the risks, benefits and alternatives for the proposed anesthesia with the patient or authorized representative who  has indicated his/her understanding and acceptance.     Dental advisory given  Plan Discussed with: CRNA and Surgeon  Anesthesia Plan Comments:        Anesthesia Quick Evaluation

## 2023-03-19 NOTE — Op Note (Signed)
[facility] Patient Name: [name] Procedure Date: 03/19/2023 10:59 AM MRN: [id number] Date of Birth: 1950-10-29 Attending MD: Katrinka Blazing , , [id number] CSN: [id number] Age: 72 Admit Type: Outpatient Procedure:                Upper GI endoscopy Indications:              Epigastric abdominal pain Providers:                Katrinka Blazing, Sharyne Richters Referring MD:              Medicines:                Monitored Anesthesia Care Complications:            No immediate complications. Estimated Blood Loss:     Estimated blood loss: none. Procedure:                Pre-Anesthesia Assessment:                           - Prior to the procedure, a History and Physical                            was performed, and patient medications, allergies                            and sensitivities were reviewed. The patient's                            tolerance of previous anesthesia was reviewed.                           - The risks and benefits of the procedure and the                            sedation options and risks were discussed with the                            patient. All questions were answered and informed                            consent was obtained.                           - ASA Grade Assessment: II - A patient with mild                            systemic disease.                           After obtaining informed consent, the endoscope was                            passed under direct vision. Throughout the                            procedure, the patient's blood pressure,  pulse, and                            oxygen saturations were monitored continuously. The                            [id number]-[id number] ([id number]) scope was introduced through the                            mouth, and advanced to the second part of duodenum.                            The upper GI endoscopy was accomplished without                            difficulty. The  patient tolerated the procedure                            well. Scope In: 11:20:12 AM Scope Out: 11:24:38 AM Total Procedure Duration: 0 hours 4 minutes 26 seconds  Findings:      A 1 cm hiatal hernia was present.      The gastroesophageal flap valve was visualized endoscopically and       classified as Hill Grade II (fold present, opens with respiration).      Patchy mild inflammation characterized by erythema was found in the       gastric antrum. Biopsies were taken with a cold forceps for Helicobacter       pylori testing.      The examined duodenum was normal. Impression:               - 1 cm hiatal hernia.                           - Gastritis. Biopsied.                           - Normal examined duodenum. Moderate Sedation:      Per Anesthesia Care Recommendation:           - Discharge patient to home (ambulatory).                           - Resume previous diet.                           - Await pathology results.                           - If negative path, can consider performing a CT                            Angio abdomen and pelvis. Procedure Code(s):        --- Professional ---                           [phone], Esophagogastroduodenoscopy, flexible,  transoral; with biopsy, single or multiple Diagnosis Code(s):        --- Professional ---                           K44.9, Diaphragmatic hernia without obstruction or                            gangrene                           K29.70, Gastritis, unspecified, without bleeding                           R10.13, Epigastric pain CPT copyright 2022 American Medical Association. All rights reserved. The codes documented in this report are preliminary and upon coder review may  be revised to meet current compliance requirements. Katrinka Blazing, MD Katrinka Blazing,  03/19/2023 11:33:02 AM This report has been signed electronically. Number of Addenda: 0

## 2023-03-19 NOTE — Anesthesia Postprocedure Evaluation (Signed)
Anesthesia Post Note  Patient: [name]  Procedure(s) Performed: ESOPHAGOGASTRODUODENOSCOPY (EGD) WITH PROPOFOL BIOPSY  Patient location during evaluation: Phase II Anesthesia Type: General Level of consciousness: awake and alert and oriented Pain management: pain level controlled Vital Signs Assessment: post-procedure vital signs reviewed and stable Respiratory status: spontaneous breathing, nonlabored ventilation and respiratory function stable Cardiovascular status: blood pressure returned to baseline and stable Postop Assessment: no apparent nausea or vomiting Anesthetic complications: no  No notable events documented.   Last Vitals:  Vitals:   03/19/23 0914 03/19/23 1128  BP: (!) 141/57 (!) 115/47  Pulse: 70 74  Resp: 19 19  Temp: 36.8 C 36.6 C  SpO2: 98% 93%    Last Pain:  Vitals:   03/19/23 1130  TempSrc:   PainSc: 0-No pain                 Louis Ivery C Isobel Eisenhuth

## 2023-03-20 ENCOUNTER — Encounter: Payer: Self-pay | Admitting: Nurse Practitioner

## 2023-03-20 ENCOUNTER — Other Ambulatory Visit (INDEPENDENT_AMBULATORY_CARE_PROVIDER_SITE_OTHER): Payer: Self-pay | Admitting: Gastroenterology

## 2023-03-20 DIAGNOSIS — R1013 Epigastric pain: Secondary | ICD-10-CM

## 2023-03-20 DIAGNOSIS — R945 Abnormal results of liver function studies: Secondary | ICD-10-CM

## 2023-03-20 LAB — SURGICAL PATHOLOGY

## 2023-03-21 ENCOUNTER — Encounter: Payer: Self-pay | Admitting: *Deleted

## 2023-03-21 ENCOUNTER — Encounter: Payer: Self-pay | Admitting: Internal Medicine

## 2023-03-24 ENCOUNTER — Encounter (HOSPITAL_COMMUNITY): Payer: Self-pay | Admitting: Gastroenterology

## 2023-03-28 ENCOUNTER — Encounter (INDEPENDENT_AMBULATORY_CARE_PROVIDER_SITE_OTHER): Payer: Self-pay | Admitting: *Deleted

## 2023-04-02 ENCOUNTER — Ambulatory Visit: Payer: Medicare Other | Admitting: Internal Medicine

## 2023-04-03 ENCOUNTER — Encounter (INDEPENDENT_AMBULATORY_CARE_PROVIDER_SITE_OTHER): Payer: Self-pay

## 2023-04-13 ENCOUNTER — Ambulatory Visit (HOSPITAL_BASED_OUTPATIENT_CLINIC_OR_DEPARTMENT_OTHER)
Admission: RE | Admit: 2023-04-13 | Discharge: 2023-04-13 | Disposition: A | Discharge status: Home / Self Care | Payer: Medicare Other | Source: Ambulatory Visit | Attending: Gastroenterology | Admitting: Gastroenterology

## 2023-04-13 DIAGNOSIS — R1013 Epigastric pain: Secondary | ICD-10-CM | POA: Diagnosis not present

## 2023-04-13 DIAGNOSIS — N3289 Other specified disorders of bladder: Secondary | ICD-10-CM | POA: Diagnosis not present

## 2023-04-13 DIAGNOSIS — J9811 Atelectasis: Secondary | ICD-10-CM | POA: Diagnosis not present

## 2023-04-13 DIAGNOSIS — R109 Unspecified abdominal pain: Secondary | ICD-10-CM | POA: Diagnosis not present

## 2023-04-13 DIAGNOSIS — Z90710 Acquired absence of both cervix and uterus: Secondary | ICD-10-CM | POA: Diagnosis not present

## 2023-04-13 LAB — POCT I-STAT CREATININE: Creatinine, Ser: 1 mg/dL (ref 0.44–1.00)

## 2023-04-13 MED ORDER — IOHEXOL 300 MG/ML  SOLN
100.0000 mL | Freq: Once | INTRAMUSCULAR | Status: AC | PRN
  Administered 2023-04-13: 80 mL via INTRAVENOUS

## 2023-04-14 ENCOUNTER — Other Ambulatory Visit (INDEPENDENT_AMBULATORY_CARE_PROVIDER_SITE_OTHER): Payer: Self-pay | Admitting: Gastroenterology

## 2023-04-14 DIAGNOSIS — R1033 Periumbilical pain: Secondary | ICD-10-CM

## 2023-04-14 MED ORDER — DICYCLOMINE HCL 10 MG PO CAPS
10.0000 mg | ORAL_CAPSULE | Freq: Two times a day (BID) | ORAL | 1 refills | Status: DC | PRN
Start: 2023-04-14 — End: 2023-04-28

## 2023-04-26 ENCOUNTER — Other Ambulatory Visit: Payer: Self-pay | Admitting: Nurse Practitioner

## 2023-04-28 ENCOUNTER — Other Ambulatory Visit (INDEPENDENT_AMBULATORY_CARE_PROVIDER_SITE_OTHER): Payer: Self-pay | Admitting: Gastroenterology

## 2023-04-28 ENCOUNTER — Encounter (INDEPENDENT_AMBULATORY_CARE_PROVIDER_SITE_OTHER): Payer: Self-pay | Admitting: Gastroenterology

## 2023-04-28 ENCOUNTER — Encounter: Payer: Self-pay | Admitting: Internal Medicine

## 2023-04-28 DIAGNOSIS — R109 Unspecified abdominal pain: Secondary | ICD-10-CM

## 2023-04-28 MED ORDER — HYOSCYAMINE SULFATE 0.125 MG SL SUBL
0.1250 mg | SUBLINGUAL_TABLET | Freq: Three times a day (TID) | SUBLINGUAL | 0 refills | Status: DC | PRN
Start: 2023-04-28 — End: 2023-06-10

## 2023-04-29 ENCOUNTER — Encounter: Payer: Self-pay | Admitting: Nurse Practitioner

## 2023-04-29 MED ORDER — VITAMIN D (ERGOCALCIFEROL) 1.25 MG (50000 UNIT) PO CAPS: 50000.0000 [IU] | ORAL_CAPSULE | ORAL | 1 refills | Status: DC

## 2023-04-30 ENCOUNTER — Encounter: Payer: Self-pay | Admitting: Internal Medicine

## 2023-05-03 ENCOUNTER — Encounter: Payer: Self-pay | Admitting: Nurse Practitioner

## 2023-05-07 ENCOUNTER — Encounter: Payer: Self-pay | Admitting: Internal Medicine

## 2023-05-14 ENCOUNTER — Encounter (HOSPITAL_COMMUNITY): Payer: Self-pay

## 2023-05-14 ENCOUNTER — Ambulatory Visit (HOSPITAL_COMMUNITY)
Admission: RE | Admit: 2023-05-14 | Discharge: 2023-05-14 | Disposition: A | Discharge status: Home / Self Care | Payer: Medicare Other | Source: Ambulatory Visit | Attending: Family Medicine | Admitting: Family Medicine

## 2023-05-14 DIAGNOSIS — Z1231 Encounter for screening mammogram for malignant neoplasm of breast: Secondary | ICD-10-CM | POA: Insufficient documentation

## 2023-05-16 ENCOUNTER — Ambulatory Visit (INDEPENDENT_AMBULATORY_CARE_PROVIDER_SITE_OTHER): Payer: Medicare Other | Admitting: Internal Medicine

## 2023-05-16 ENCOUNTER — Encounter: Payer: Self-pay | Admitting: Internal Medicine

## 2023-05-16 VITALS — BP 109/58 | HR 87 | Ht 66.0 in | Wt 152.6 lb

## 2023-05-16 DIAGNOSIS — N3281 Overactive bladder: Secondary | ICD-10-CM | POA: Diagnosis not present

## 2023-05-16 DIAGNOSIS — Z7984 Long term (current) use of oral hypoglycemic drugs: Secondary | ICD-10-CM | POA: Diagnosis not present

## 2023-05-16 DIAGNOSIS — K219 Gastro-esophageal reflux disease without esophagitis: Secondary | ICD-10-CM | POA: Diagnosis not present

## 2023-05-16 DIAGNOSIS — I10 Essential (primary) hypertension: Secondary | ICD-10-CM

## 2023-05-16 DIAGNOSIS — E1169 Type 2 diabetes mellitus with other specified complication: Secondary | ICD-10-CM | POA: Diagnosis not present

## 2023-05-16 MED ORDER — PANTOPRAZOLE SODIUM 40 MG PO TBEC: 40.0000 mg | DELAYED_RELEASE_TABLET | Freq: Two times a day (BID) | ORAL | 2 refills | Status: DC

## 2023-05-16 MED ORDER — OXYBUTYNIN CHLORIDE ER 10 MG PO TB24
10.0000 mg | ORAL_TABLET | Freq: Every day | ORAL | 2 refills | Status: DC
Start: 2023-05-16 — End: 2023-06-10

## 2023-05-16 NOTE — Assessment & Plan Note (Signed)
Lab Results  Component Value Date   HGBA1C 8.7 (A) 03/07/2023   Uncontrolled Associated with HLD and HTN Was diet controlled, recently worse - on Metformin 500 mg QD and glipizide 2.5 mg QD Followed by endocrinology Advised to follow strict low-carb diet for now Advised to follow up with Ophthalmology for diabetic eye exam

## 2023-05-16 NOTE — Assessment & Plan Note (Addendum)
Uncontrolled with omeprazole and Carafate now Switched to pantoprazole 40 mg, and BID dosing for now due to persistent epigastric and periumbilical pain Recent CT abdomen and EGD results reviewed and discussed with the patient Avoid hot and spicy food

## 2023-05-16 NOTE — Assessment & Plan Note (Signed)
Has nocturia and urinary frequency at daytime as well, likely due to overactive bladder Started oxybutynin 10 mg nightly Follow up with urology-has history of recurrent nephrolithiasis

## 2023-05-16 NOTE — Progress Notes (Signed)
Acute Office Visit  Subjective:    Patient ID: [name], female    DOB: 03/10/1951, 72 y.o.   MRN: [id number]  Chief Complaint  Patient presents with   Abdominal Pain   Dizziness    Dizzy spells more often    Urinary Incontinence    Patient is having trouble holding her bladder    HPI Patient is in today for complaint of persistent epigastric and periumbilical pain with bloating sensation.  She had EGD, which showed gastritis.  She was placed on omeprazole 40 mg QD and Carafate as needed.  She still has persistent epigastric pain, that radiates upwards.  Denies any diarrhea, melena or hematochezia.  She also had CT of abdomen/pelvis done, which was negative for any acute intra-abdominal pathology.  She had nonobstructing left-sided nephrolithiasis, which is less likely contributing to her symptoms currently.  She also reports episodes of dizziness, especially upon standing up.  Does not report any episode of seizure-like activity.  Her BP is borderline low compared to prior.  She takes amlodipine 10 mg QD and metoprolol 25 mg twice daily currently.  Denies any chest pain, dyspnea or palpitations.  She also reports urinary frequency, which is chronic.  She also has nocturia.  Denies any dysuria or hematuria currently.  Past Medical History:  Diagnosis Date   Arthritis    Asthma    takes Singulair nightly   Diabetes mellitus    takes levemir daily and Metformin daily   Generalized headaches    GERD (gastroesophageal reflux disease)    takes Omeprazole daily   Herniated disc, cervical    History of kidney stones    Hyperlipidemia    takes Welchol daily   Hypertension    takes Enalapril dialy   Hypothyroidism    takes Synthroid daily   IBS (irritable bowel syndrome)    IPF (idiopathic pulmonary fibrosis) (HCC) 2024   Joint pain    hands and neck   Nocturia    PONV (postoperative nausea and vomiting)    TIA (transient ischemic attack)    diagnosed by EEG but never  knew she had it   Trigger finger    Weakness    Weakness    in both hands    Past Surgical History:  Procedure Laterality Date   ABDOMINAL HYSTERECTOMY  1980   ANTERIOR CERVICAL DECOMP/DISCECTOMY FUSION N/A 10/18/2013   Procedure: Cervical three-four, Cervical four-five, Cervical seven-Thoracic one anterior cervical decompression with fusion plating and bonegraft;  Surgeon: Hewitt Shorts, MD;  Location: [facility];  Service: Neurosurgery;  Laterality: N/A;  Cervical three-four, Cervical four-five, Cervical seven-Thoracic one anterior cervical decompression with fusion plating and bonegraft   APPENDECTOMY     done with hysterectomy   BACK SURGERY  2007   lumbar fusion   BIOPSY  03/19/2023   Procedure: BIOPSY;  Surgeon: Marguerita Merles, Reuel Boom, MD;  Location: [facility];  Service: Gastroenterology;;   carpel tunnel Bilateral    left first then right   cataract implants     CHOLECYSTECTOMY N/A 11/22/2021   Procedure: LAPAROSCOPIC CHOLECYSTECTOMY;  Surgeon: Lewie Chamber, DO;  Location: [facility];  Service: General;  Laterality: N/A;   COLONOSCOPY     COLONOSCOPY WITH PROPOFOL N/A 11/27/2022   Procedure: COLONOSCOPY WITH PROPOFOL;  Surgeon: Dolores Frame, MD;  Location: [facility];  Service: Gastroenterology;  Laterality: N/A;  10:00am, asa 1-2   CYSTOSCOPY WITH RETROGRADE PYELOGRAM, URETEROSCOPY AND STENT PLACEMENT Left 04/08/2022  Procedure: CYSTOSCOPY WITH RETROGRADE PYELOGRAM, URETEROSCOPY AND STENT PLACEMENT;  Surgeon: Marcine Matar, MD;  Location: [facility];  Service: Urology;  Laterality: Left;   ESOPHAGOGASTRODUODENOSCOPY     hx of h pylori   ESOPHAGOGASTRODUODENOSCOPY (EGD) WITH PROPOFOL N/A 03/19/2023   Procedure: ESOPHAGOGASTRODUODENOSCOPY (EGD) WITH PROPOFOL;  Surgeon: Dolores Frame, MD;  [facility]: [facility];  Service: Gastroenterology;  Laterality: N/A;  10:30am;asa 2   EYE SURGERY  11/18/2008   right -  cataracts   EYE SURGERY  06/01/2009   left - cataracts   herniated disc   10/2000, 08/31/2008   C5-C6 then C6-C7   HOLMIUM LASER APPLICATION Left 04/08/2022   Procedure: HOLMIUM LASER APPLICATION;  Surgeon: Marcine Matar, MD;  Location: [facility];  Service: Urology;  Laterality: Left;   IR URETERAL STENT RIGHT NEW ACCESS W/O SEP NEPHROSTOMY CATH  07/26/2021   knee anthroscopy bilateral Bilateral    KNEE ARTHROSCOPY     LITHOTRIPSY  11/12/2007   right side   NEPHROLITHOTOMY Right 07/26/2021   Procedure: NEPHROLITHOTOMY PERCUTANEOUS;  Surgeon: Marcine Matar, MD;  Location: [facility];  Service: Urology;  Laterality: Right;   Parathyroid surgery     2012-08-04  PARATHYROIDECTOMY  07/02/2012   Procedure: PARATHYROIDECTOMY;  Surgeon: Velora Heckler, MD;  Location: [facility];  Service: General;  Laterality: N/A;   TONSILLECTOMY     as a child    Family History  Problem Relation Age of Onset   Cancer Mother        pancreatic    Social History   Socioeconomic History   Marital status: Widowed    Spouse name: Not on file   Number of children: Not on file   Years of education: Not on file   Highest education level: GED or equivalent  Occupational History   Not on file  Tobacco Use   Smoking status: Former    Current packs/day: 0.00    Average packs/day: 1 pack/day for 40.0 years (40.0 ttl pk-yrs)    Types: Cigarettes    Start date: 07/19/1972    Quit date: 07/19/2012    Years since quitting: 10.8    Passive exposure: Past   Smokeless tobacco: Never   Tobacco comments:    quit smoking in 2012-08-04 Vaping Use   Vaping status: Never Used  Substance and Sexual Activity   Alcohol use: No    Alcohol/week: 0.0 standard drinks of alcohol   Drug use: No   Sexual activity: Not Currently    Birth control/protection: Surgical  Other Topics Concern   Not on file  Social History Narrative   Widow,husband died 11-05-20after 35 years marriage.Retired.   Social  Determinants of Health   Financial Resource Strain: Low Risk  (03/07/2023)   Overall Financial Resource Strain (CARDIA)    Difficulty of Paying Living Expenses: Not hard at all  Food Insecurity: No Food Insecurity (03/07/2023)   Hunger Vital Sign    Worried About Running Out of Food in the Last Year: Never true    Ran Out of Food in the Last Year: Never true  Transportation Needs: No Transportation Needs (03/07/2023)   PRAPARE - Administrator, Civil Service (Medical): No    Lack of Transportation (Non-Medical): No  Physical Activity: Insufficiently Active (03/07/2023)   Exercise Vital Sign    Days of Exercise per Week: 2 days    Minutes of Exercise per Session: 60 min  Stress: No  Stress Concern Present (03/07/2023)   [employer] of Occupational Health - Occupational Stress Questionnaire    Feeling of Stress : Only a little  Social Connections: Unknown (03/07/2023)   Social Connection and Isolation Panel [NHANES]    Frequency of Communication with Friends and Family: Twice a week    Frequency of Social Gatherings with Friends and Family: Twice a week    Attends Religious Services: More than 4 times per year    Active Member of [employer] or Organizations: Patient declined    Attends Banker Meetings: Not on file    Marital Status: Widowed  Intimate Partner Violence: Not At Risk (03/05/2022)   Humiliation, Afraid, Rape, and Kick questionnaire    Fear of Current or Ex-Partner: No    Emotionally Abused: No    Physically Abused: No    Sexually Abused: No    Outpatient Medications Prior to Visit  Medication Sig Dispense Refill   Accu-Chek Softclix Lancets lancets Use as instructed to monitor glucose once daily. 100 each 2   acetaminophen (TYLENOL) 500 MG tablet Take 500 mg by mouth every 6 (six) hours as needed.     amLODipine (NORVASC) 10 MG tablet TAKE 1 TABLET(10 MG) BY MOUTH DAILY (Patient taking differently: Take 10 mg by mouth every evening.) 90 tablet 0   blood  glucose meter kit and supplies KIT Dispense based on patient and insurance preference. Use twice daily (before breakfast and before bed). 1 each 0   estradiol (ESTRACE) 1 MG tablet TAKE 1 TABLET(1 MG) BY MOUTH DAILY (Patient taking differently: Take 0.5 mg by mouth in the morning.) 90 tablet 1   glipiZIDE (GLUCOTROL XL) 2.5 MG 24 hr tablet Take 1 tablet (2.5 mg total) by mouth daily with breakfast. 90 tablet 1   glucose blood (ACCU-CHEK GUIDE) test strip USE TO CHECK BLOOD SUGAR TWICE DAILY, ONCE BEFORE BREAKFAST AND ONCE BEFORE BEDTIME 200 strip 1   hyoscyamine (LEVSIN SL) 0.125 MG SL tablet Place 1 tablet (0.125 mg total) under the tongue every 8 (eight) hours as needed (abdominal pain). 30 tablet 0   metFORMIN (GLUCOPHAGE-XR) 500 MG 24 hr tablet Take 1 tablet (500 mg total) by mouth daily with breakfast. 90 tablet 1   metoprolol tartrate (LOPRESSOR) 25 MG tablet TAKE 1 TABLET(25 MG) BY MOUTH TWICE DAILY 180 tablet 3   sucralfate (CARAFATE) 1 GM/10ML suspension Take 10 mLs (1 g total) by mouth 4 (four) times daily -  with meals and at bedtime. (Patient taking differently: Take 1 g by mouth in the morning and at bedtime.) 420 mL 1   thyroid (NP THYROID) 60 MG tablet TAKE 1 TABLET BY MOUTH EVERY DAY BEFORE BREAKFAST 90 tablet 3   Vitamin D, Ergocalciferol, (DRISDOL) 1.25 MG (50000 UNIT) CAPS capsule Take 1 capsule (50,000 Units total) by mouth every 7 (seven) days. 12 capsule 1   omeprazole (PRILOSEC) 40 MG capsule Take 1 capsule (40 mg total) by mouth daily. (Patient taking differently: Take 40 mg by mouth daily at 4 PM.) 60 capsule 1   No facility-administered medications prior to visit.    Allergies  Allergen Reactions   Advair Diskus [Fluticasone-Salmeterol] Anaphylaxis   Codeine Nausea And Vomiting   Hydrocodone Nausea And Vomiting   Levothyroxine Other (See Comments)    Caused lower leg swelling   Oxycodone Nausea And Vomiting   Statins     Muscle and joint pain   Tetracyclines &  Related Itching    Internal    Review  of Systems  Constitutional:  Negative for chills and fever.  HENT:  Negative for congestion, sinus pressure, sinus pain and sore throat.   Eyes:  Negative for pain and discharge.  Respiratory:  Negative for cough and shortness of breath.   Cardiovascular:  Negative for chest pain and palpitations.  Gastrointestinal:  Positive for abdominal pain. Negative for diarrhea, nausea and vomiting.  Endocrine: Negative for polydipsia and polyuria.  Genitourinary:  Negative for dysuria and hematuria.  Musculoskeletal:  Positive for arthralgias and myalgias. Negative for neck pain and neck stiffness.  Skin:  Negative for rash.  Neurological:  Positive for dizziness. Negative for weakness.  Psychiatric/Behavioral:  Negative for agitation and behavioral problems.        Objective:    Physical Exam Vitals reviewed.  Constitutional:      General: She is not in acute distress.    Appearance: She is not diaphoretic.  HENT:     Head: Normocephalic and atraumatic.     Nose: Nose normal.     Mouth/Throat:     Mouth: Mucous membranes are moist.  Eyes:     General: No scleral icterus.    Extraocular Movements: Extraocular movements intact.  Neck:     Comments: Scar noted, site C/D/I Cardiovascular:     Rate and Rhythm: Normal rate and regular rhythm.     Pulses: Normal pulses.     Heart sounds: Normal heart sounds. No murmur heard. Pulmonary:     Breath sounds: Normal breath sounds. No wheezing or rales.  Abdominal:     Palpations: Abdomen is soft.     Tenderness: There is abdominal tenderness (Mild, epigastric and LUQ). There is no guarding or rebound.  Musculoskeletal:     Cervical back: Neck supple. No tenderness.     Right lower leg: No edema.     Left lower leg: No edema.  Skin:    General: Skin is warm.     Findings: No rash.  Neurological:     General: No focal deficit present.     Mental Status: She is alert and oriented to person, place,  and time.  Psychiatric:        Mood and Affect: Mood normal.        Behavior: Behavior normal.     BP (!) 109/58 (BP Location: Right Arm, Patient Position: Sitting, Cuff Size: Normal)   Pulse 87   Ht 5\' 6"  (1.676 m)   Wt 152 lb 9.6 oz (69.2 kg)   SpO2 95%   BMI 24.63 kg/m  Wt Readings from Last 3 Encounters:  05/16/23 152 lb 9.6 oz (69.2 kg)  03/10/23 150 lb 3.2 oz (68.1 kg)  03/07/23 150 lb (68 kg)        Assessment & Plan:   Problem List Items Addressed This Visit       Cardiovascular and Mediastinum   Essential hypertension    BP Readings from Last 1 Encounters:  05/16/23 (!) 109/58   Overall remains well-controlled with Amlodipine and Metoprolol Due to borderline low BP and current dizziness, advised to decrease dose of amlodipine to 5 mg QD Counseled for compliance with the medications Advised DASH diet and moderate exercise/walking, at least 150 mins/week        Digestive   GERD (gastroesophageal reflux disease) - Primary    Uncontrolled with omeprazole and Carafate now Switched to pantoprazole 40 mg, and BID dosing for now due to persistent epigastric and periumbilical pain Recent CT abdomen and EGD results reviewed  and discussed with the patient Avoid hot and spicy food      Relevant Medications   pantoprazole (PROTONIX) 40 MG tablet     Endocrine   Type 2 diabetes mellitus with other specified complication (HCC)    Lab Results  Component Value Date   HGBA1C 8.7 (A) 03/07/2023   Uncontrolled Associated with HLD and HTN Was diet controlled, recently worse - on Metformin 500 mg QD and glipizide 2.5 mg QD Followed by endocrinology Advised to follow strict low-carb diet for now Advised to follow up with Ophthalmology for diabetic eye exam        Genitourinary   Overactive bladder    Has nocturia and urinary frequency at daytime as well, likely due to overactive bladder Started oxybutynin 10 mg nightly Follow up with urology-has history of  recurrent nephrolithiasis      Relevant Medications   oxybutynin (DITROPAN-XL) 10 MG 24 hr tablet     Meds ordered this encounter  Medications   pantoprazole (PROTONIX) 40 MG tablet    Sig: Take 1 tablet (40 mg total) by mouth 2 (two) times daily.    Dispense:  60 tablet    Refill:  2   oxybutynin (DITROPAN-XL) 10 MG 24 hr tablet    Sig: Take 1 tablet (10 mg total) by mouth at bedtime.    Dispense:  30 tablet    Refill:  2     Dorien Bessent Concha Se, MD

## 2023-05-16 NOTE — Patient Instructions (Signed)
Please start taking Amlodipine half tablet once daily instead of 1 tablet.  Please start taking Pantoprazole 40 mg twice daily for now.  Avoid hot and spicy food.  Please take Oxybutynin for urinary frequency.  Perform scheduled voiding.

## 2023-05-16 NOTE — Assessment & Plan Note (Addendum)
BP Readings from Last 1 Encounters:  05/16/23 (!) 109/58   Overall remains well-controlled with Amlodipine and Metoprolol Due to borderline low BP and current dizziness, advised to decrease dose of amlodipine to 5 mg QD Counseled for compliance with the medications Advised DASH diet and moderate exercise/walking, at least 150 mins/week

## 2023-05-20 ENCOUNTER — Encounter: Payer: Self-pay | Admitting: Internal Medicine

## 2023-05-20 ENCOUNTER — Other Ambulatory Visit: Payer: Self-pay | Admitting: Internal Medicine

## 2023-05-20 ENCOUNTER — Ambulatory Visit (HOSPITAL_COMMUNITY): Payer: Medicare Other

## 2023-05-20 DIAGNOSIS — I10 Essential (primary) hypertension: Secondary | ICD-10-CM

## 2023-05-20 MED ORDER — AMLODIPINE BESYLATE 5 MG PO TABS: 5.0000 mg | ORAL_TABLET | Freq: Every day | ORAL | 1 refills | Status: DC

## 2023-05-21 ENCOUNTER — Encounter (INDEPENDENT_AMBULATORY_CARE_PROVIDER_SITE_OTHER): Payer: Self-pay

## 2023-06-03 DIAGNOSIS — E039 Hypothyroidism, unspecified: Secondary | ICD-10-CM | POA: Diagnosis not present

## 2023-06-03 DIAGNOSIS — E119 Type 2 diabetes mellitus without complications: Secondary | ICD-10-CM | POA: Diagnosis not present

## 2023-06-04 ENCOUNTER — Telehealth (INDEPENDENT_AMBULATORY_CARE_PROVIDER_SITE_OTHER): Payer: Self-pay | Admitting: *Deleted

## 2023-06-04 NOTE — Telephone Encounter (Signed)
Pt in reminder file for repeat LFT on 06/10/23. She had this done 06/03/23 ordered by another provider but results are in epic to review.

## 2023-06-05 NOTE — Telephone Encounter (Signed)
Discussed with patient per [facility] improved but still mildly elevated, can we have her set up a follow up visit to discuss further  Patient verbalized understanding and states she wants to hold off on making an appt for now because she is seeing her pcp and endocrinologist. I let her know to call us back if she wants to schedule.

## 2023-06-09 ENCOUNTER — Encounter (HOSPITAL_BASED_OUTPATIENT_CLINIC_OR_DEPARTMENT_OTHER): Payer: Self-pay | Admitting: Pulmonary Disease

## 2023-06-09 NOTE — Progress Notes (Signed)
H&P  Chief Complaint: History of kidney stones  History of Present Illness: [name] is a 72 y.o. year old female previously seen-at [facility] for urolithiasis.  She does have a history of hyperparathyroidism, and has had a parathyroid adenectomy by Dr. Gerrit Friends.  Interventions- 10.27.2022: Right percutaneous nephrolithotomy of a 21 mm stone 7.23.2023: Left ureteroscopy with holmium laser of ureteral and renal calculi  24-hour urine studies: Volume-1.1 L Oxalate-124 Sodium urate-3.16 Uric acid-2.67 Stone composition--calcium oxalate monohydrate/calcium oxalate dihydrate-80/20%  Past imaging in March of this year included an ultrasound revealing 7 mm right lower pole stone, perhaps a 7 mm remaining left renal stone in June, she had a CT angiogram of chest abdomen pelvis.  This revealed a couple of punctate calcifications in the left upper pole, 3 mm right lower pole stone.  She has multiple questions about recent laboratories-ALT is elevated, creatinine above his baseline, now at 1.06.  She questions why these have been occurring.  I did not order these labs.  She also has urinary frequency, urgency and occasional urgency incontinence.  Present for quite some time but worsening.  Past Medical History:  Diagnosis Date   Arthritis    Asthma    takes Singulair nightly   Diabetes mellitus    takes levemir daily and Metformin daily   Generalized headaches    GERD (gastroesophageal reflux disease)    takes Omeprazole daily   Herniated disc, cervical    History of kidney stones    Hyperlipidemia    takes Welchol daily   Hypertension    takes Enalapril dialy   Hypothyroidism    takes Synthroid daily   IBS (irritable bowel syndrome)    IPF (idiopathic pulmonary fibrosis) (HCC) 2024   Joint pain    hands and neck   Nocturia    PONV (postoperative nausea and vomiting)    TIA (transient ischemic attack)    diagnosed by EEG but never knew she had it   Trigger finger     Weakness    Weakness    in both hands    Past Surgical History:  Procedure Laterality Date   ABDOMINAL HYSTERECTOMY  1980   ANTERIOR CERVICAL DECOMP/DISCECTOMY FUSION N/A 10/18/2013   Procedure: Cervical three-four, Cervical four-five, Cervical seven-Thoracic one anterior cervical decompression with fusion plating and bonegraft;  Surgeon: Hewitt Shorts, MD;  Location: [facility];  Service: Neurosurgery;  Laterality: N/A;  Cervical three-four, Cervical four-five, Cervical seven-Thoracic one anterior cervical decompression with fusion plating and bonegraft   APPENDECTOMY     done with hysterectomy   BACK SURGERY  2007   lumbar fusion   BIOPSY  03/19/2023   Procedure: BIOPSY;  Surgeon: Marguerita Merles, Reuel Boom, MD;  [facility];  Service: Gastroenterology;;   carpel tunnel Bilateral    left first then right   cataract implants     CHOLECYSTECTOMY N/A 11/22/2021   Procedure: LAPAROSCOPIC CHOLECYSTECTOMY;  Surgeon: Lewie Chamber, DO;  Location: [facility];  Service: General;  Laterality: N/A;   COLONOSCOPY     COLONOSCOPY WITH PROPOFOL N/A 11/27/2022   Procedure: COLONOSCOPY WITH PROPOFOL;  Surgeon: Dolores Frame, MD;  [facility];  Service: Gastroenterology;  Laterality: N/A;  10:00am, asa 1-2   CYSTOSCOPY WITH RETROGRADE PYELOGRAM, URETEROSCOPY AND STENT PLACEMENT Left 04/08/2022   Procedure: CYSTOSCOPY WITH RETROGRADE PYELOGRAM, URETEROSCOPY AND STENT PLACEMENT;  Surgeon: Marcine Matar, MD;  Location: [facility];  Service: Urology;  Laterality: Left;   ESOPHAGOGASTRODUODENOSCOPY  hx of h pylori   ESOPHAGOGASTRODUODENOSCOPY (EGD) WITH PROPOFOL N/A 03/19/2023   Procedure: ESOPHAGOGASTRODUODENOSCOPY (EGD) WITH PROPOFOL;  Surgeon: Dolores Frame, MD;  [facility];  Service: Gastroenterology;  Laterality: N/A;  10:30am;asa 2   EYE SURGERY  11/18/2008   right - cataracts   EYE SURGERY  06/01/2009   left  - cataracts   herniated disc   10/2000, 08/31/2008   C5-C6 then C6-C7   HOLMIUM LASER APPLICATION Left 04/08/2022   Procedure: HOLMIUM LASER APPLICATION;  Surgeon: Marcine Matar, MD;  Location: [facility];  Service: Urology;  Laterality: Left;   IR URETERAL STENT RIGHT NEW ACCESS W/O SEP NEPHROSTOMY CATH  07/26/2021   knee anthroscopy bilateral Bilateral    KNEE ARTHROSCOPY     LITHOTRIPSY  11/12/2007   right side   NEPHROLITHOTOMY Right 07/26/2021   Procedure: NEPHROLITHOTOMY PERCUTANEOUS;  Surgeon: Marcine Matar, MD;  Location: [facility];  Service: Urology;  Laterality: Right;   Parathyroid surgery     October 2013   PARATHYROIDECTOMY  07/02/2012   Procedure: PARATHYROIDECTOMY;  Surgeon: Velora Heckler, MD;  Location: [facility];  Service: General;  Laterality: N/A;   TONSILLECTOMY     as a child    Home Medications:  (Not in a hospital admission)   Allergies:  Allergies  Allergen Reactions   Advair Diskus [Fluticasone-Salmeterol] Anaphylaxis   Codeine Nausea And Vomiting   Hydrocodone Nausea And Vomiting   Levothyroxine Other (See Comments)    Caused lower leg swelling   Oxycodone Nausea And Vomiting   Statins     Muscle and joint pain   Tetracyclines & Related Itching    Internal    Family History  Problem Relation Age of Onset   Cancer Mother        pancreatic    Social History:  reports that she quit smoking about 10 years ago. Her smoking use included cigarettes. She started smoking about 50 years ago. She has a 40 pack-year smoking history. She has been exposed to tobacco smoke. She has never used smokeless tobacco. She reports that she does not drink alcohol and does not use drugs.  ROS: A complete review of systems was performed.  All systems are negative except for pertinent findings as noted.  Physical Exam:  Vital signs in last 24 hours: @VSRANGES @ General:  Alert and oriented, No acute distress HEENT: Normocephalic, atraumatic Neck: No  JVD or lymphadenopathy Cardiovascular: Regular rate  Lungs: Normal inspiratory/expiratory excursion Extremities: No edema Neurologic: Grossly intact  I have reviewed notes from [facility]  I have reviewed urinalysis results  I have independently reviewed prior imaging--recent CT angiogram  I have reviewed recent laboratories  Impression/Assessment:  1.  History of urolithiasis with tiny stones bilaterally, none causing her symptoms  2.  Overactive bladder symptoms, bothersome  Plan:  1.  Overactive bladder guide sheet given  2.  I recommended that she follow-up with her GI, endocrine and primary care physicians regarding her abnormal blood test  3.  I will have her come back in 3 months to follow-up for her urinary symptoms.  Bertram Millard Jonni Oelkers 06/09/2023, 7:37 PM  Bertram Millard. Ellanora Rayborn MD

## 2023-06-10 ENCOUNTER — Encounter: Payer: Self-pay | Admitting: Urology

## 2023-06-10 ENCOUNTER — Encounter: Payer: Self-pay | Admitting: Nurse Practitioner

## 2023-06-10 ENCOUNTER — Ambulatory Visit (INDEPENDENT_AMBULATORY_CARE_PROVIDER_SITE_OTHER): Payer: Medicare Other | Admitting: Nurse Practitioner

## 2023-06-10 ENCOUNTER — Ambulatory Visit (INDEPENDENT_AMBULATORY_CARE_PROVIDER_SITE_OTHER): Payer: Medicare Other | Admitting: Urology

## 2023-06-10 ENCOUNTER — Telehealth: Payer: Self-pay | Admitting: Nurse Practitioner

## 2023-06-10 VITALS — BP 150/67 | HR 81

## 2023-06-10 VITALS — BP 138/61 | HR 71 | Ht 66.0 in | Wt 154.6 lb

## 2023-06-10 DIAGNOSIS — Z7984 Long term (current) use of oral hypoglycemic drugs: Secondary | ICD-10-CM | POA: Diagnosis not present

## 2023-06-10 DIAGNOSIS — E039 Hypothyroidism, unspecified: Secondary | ICD-10-CM

## 2023-06-10 DIAGNOSIS — E119 Type 2 diabetes mellitus without complications: Secondary | ICD-10-CM

## 2023-06-10 DIAGNOSIS — E1165 Type 2 diabetes mellitus with hyperglycemia: Secondary | ICD-10-CM | POA: Diagnosis not present

## 2023-06-10 DIAGNOSIS — Z87442 Personal history of urinary calculi: Secondary | ICD-10-CM | POA: Diagnosis not present

## 2023-06-10 DIAGNOSIS — R3915 Urgency of urination: Secondary | ICD-10-CM | POA: Diagnosis not present

## 2023-06-10 DIAGNOSIS — D351 Benign neoplasm of parathyroid gland: Secondary | ICD-10-CM

## 2023-06-10 DIAGNOSIS — E559 Vitamin D deficiency, unspecified: Secondary | ICD-10-CM | POA: Diagnosis not present

## 2023-06-10 DIAGNOSIS — R350 Frequency of micturition: Secondary | ICD-10-CM | POA: Diagnosis not present

## 2023-06-10 DIAGNOSIS — N3281 Overactive bladder: Secondary | ICD-10-CM

## 2023-06-10 LAB — POCT GLYCOSYLATED HEMOGLOBIN (HGB A1C): Hemoglobin A1C: 8.5 % — AB (ref 4.0–5.6)

## 2023-06-10 MED ORDER — TRESIBA FLEXTOUCH 100 UNIT/ML ~~LOC~~ SOPN: 15.0000 [IU] | PEN_INJECTOR | Freq: Every day | SUBCUTANEOUS | 3 refills | Status: DC

## 2023-06-10 MED ORDER — INSULIN GLARGINE 100 UNITS/ML SOLOSTAR PEN: 15.0000 [IU] | PEN_INJECTOR | Freq: Every day | SUBCUTANEOUS | 3 refills | Status: DC

## 2023-06-10 MED ORDER — PEN NEEDLES 31G X 6 MM MISC: 3 refills | Status: AC

## 2023-06-10 MED ORDER — GLIPIZIDE ER 2.5 MG PO TB24: 2.5000 mg | ORAL_TABLET | Freq: Every day | ORAL | 1 refills | Status: DC

## 2023-06-10 MED ORDER — ACCU-CHEK SOFTCLIX LANCETS MISC: 2 refills | Status: DC

## 2023-06-10 MED ORDER — ACCU-CHEK GUIDE VI STRP: ORAL_STRIP | 1 refills | Status: DC

## 2023-06-10 NOTE — Progress Notes (Signed)
Endocrinology Follow Up Note                                         06/10/2023, 10:11 AM  Subjective:   Subjective    [name] is a 72 y.o.-year-old female patient being seen in follow up after being seen in consultation for hypothyroidism, diabetes, hx parathyroid adenoma referred by Anabel Halon, MD.   Past Medical History:  Diagnosis Date   Arthritis    Asthma    takes Singulair nightly   Diabetes mellitus    takes levemir daily and Metformin daily   Generalized headaches    GERD (gastroesophageal reflux disease)    takes Omeprazole daily   Herniated disc, cervical    History of kidney stones    Hyperlipidemia    takes Welchol daily   Hypertension    takes Enalapril dialy   Hypothyroidism    takes Synthroid daily   IBS (irritable bowel syndrome)    IPF (idiopathic pulmonary fibrosis) (HCC) 2024   Joint pain    hands and neck   Nocturia    PONV (postoperative nausea and vomiting)    TIA (transient ischemic attack)    diagnosed by EEG but never knew she had it   Trigger finger    Weakness    Weakness    in both hands    Past Surgical History:  Procedure Laterality Date   ABDOMINAL HYSTERECTOMY  1980   ANTERIOR CERVICAL DECOMP/DISCECTOMY FUSION N/A 10/18/2013   Procedure: Cervical three-four, Cervical four-five, Cervical seven-Thoracic one anterior cervical decompression with fusion plating and bonegraft;  Surgeon: Hewitt Shorts, MD;  Location: [facility];  Service: Neurosurgery;  Laterality: N/A;  Cervical three-four, Cervical four-five, Cervical seven-Thoracic one anterior cervical decompression with fusion plating and bonegraft   APPENDECTOMY     done with hysterectomy   BACK SURGERY  2007   lumbar fusion   BIOPSY  03/19/2023   Procedure: BIOPSY;  Surgeon: Marguerita Merles, Reuel Boom, MD;  Location: [facility];  Service: Gastroenterology;;   carpel tunnel Bilateral    left  first then right   cataract implants     CHOLECYSTECTOMY N/A 11/22/2021   Procedure: LAPAROSCOPIC CHOLECYSTECTOMY;  Surgeon: Lewie Chamber, DO;  Location: [facility];  Service: General;  Laterality: N/A;   COLONOSCOPY     COLONOSCOPY WITH PROPOFOL N/A 11/27/2022   Procedure: COLONOSCOPY WITH PROPOFOL;  Surgeon: Dolores Frame, MD;  Location: [facility];  Service: Gastroenterology;  Laterality: N/A;  10:00am, asa 1-2   CYSTOSCOPY WITH RETROGRADE PYELOGRAM, URETEROSCOPY AND STENT PLACEMENT Left 04/08/2022   Procedure: CYSTOSCOPY WITH RETROGRADE PYELOGRAM, URETEROSCOPY AND STENT PLACEMENT;  Surgeon: Marcine Matar, MD;  Location: [facility];  Service: Urology;  Laterality: Left;   ESOPHAGOGASTRODUODENOSCOPY     hx of h pylori   ESOPHAGOGASTRODUODENOSCOPY (EGD) WITH PROPOFOL N/A 03/19/2023   Procedure: ESOPHAGOGASTRODUODENOSCOPY (EGD) WITH PROPOFOL;  Surgeon: Dolores Frame, MD;  Location: [facility];  Service: Gastroenterology;  Laterality: N/A;  10:30am;asa 2   EYE SURGERY  11/18/2008   right - cataracts   EYE SURGERY  06/01/2009   left - cataracts   herniated disc   10/2000, 08/31/2008   C5-C6 then C6-C7   HOLMIUM LASER APPLICATION Left 04/08/2022   Procedure: HOLMIUM LASER APPLICATION;  Surgeon: Marcine Matar, MD;  Location: [facility];  Service: Urology;  Laterality: Left;   IR URETERAL STENT RIGHT NEW ACCESS W/O SEP NEPHROSTOMY CATH  07/26/2021   knee anthroscopy bilateral Bilateral    KNEE ARTHROSCOPY     LITHOTRIPSY  11/12/2007   right side   NEPHROLITHOTOMY Right 07/26/2021   Procedure: NEPHROLITHOTOMY PERCUTANEOUS;  Surgeon: Marcine Matar, MD;  Location: [facility];  Service: Urology;  Laterality: Right;   Parathyroid surgery     2012-08-03  PARATHYROIDECTOMY  07/02/2012   Procedure: PARATHYROIDECTOMY;  Surgeon: Velora Heckler, MD;  Location: [facility];  Service: General;  Laterality: N/A;   TONSILLECTOMY     as a  child    Social History   Socioeconomic History   Marital status: Widowed    Spouse name: Not on file   Number of children: Not on file   Years of education: Not on file   Highest education level: GED or equivalent  Occupational History   Not on file  Tobacco Use   Smoking status: Former    Current packs/day: 0.00    Average packs/day: 1 pack/day for 40.0 years (40.0 ttl pk-yrs)    Types: Cigarettes    Start date: 07/19/1972    Quit date: 07/19/2012    Years since quitting: 10.8    Passive exposure: Past   Smokeless tobacco: Never   Tobacco comments:    quit smoking in 08/03/2012 Vaping Use   Vaping status: Never Used  Substance and Sexual Activity   Alcohol use: No    Alcohol/week: 0.0 standard drinks of alcohol   Drug use: No   Sexual activity: Not Currently    Birth control/protection: Surgical  Other Topics Concern   Not on file  Social History Narrative   Widow,husband died 11/04/20after 35 years marriage.Retired.   Social Determinants of Health   Financial Resource Strain: Low Risk  (03/07/2023)   Overall Financial Resource Strain (CARDIA)    Difficulty of Paying Living Expenses: Not hard at all  Food Insecurity: No Food Insecurity (03/07/2023)   Hunger Vital Sign    Worried About Running Out of Food in the Last Year: Never true    Ran Out of Food in the Last Year: Never true  Transportation Needs: No Transportation Needs (03/07/2023)   PRAPARE - Administrator, Civil Service (Medical): No    Lack of Transportation (Non-Medical): No  Physical Activity: Insufficiently Active (03/07/2023)   Exercise Vital Sign    Days of Exercise per Week: 2 days    Minutes of Exercise per Session: 60 min  Stress: No Stress Concern Present (03/07/2023)   [employer] of Occupational Health - Occupational Stress Questionnaire    Feeling of Stress : Only a little  Social Connections: Unknown (03/07/2023)   Social Connection and Isolation Panel [NHANES]     Frequency of Communication with Friends and Family: Twice a week    Frequency of Social Gatherings with Friends and Family: Twice a week    Attends Religious Services: More than 4 times per year    Active Member of [employer] or Organizations: Patient declined  Attends Banker Meetings: Not on file    Marital Status: Widowed    Family History  Problem Relation Age of Onset   Cancer Mother        pancreatic    Outpatient Encounter Medications as of 06/10/2023  Medication Sig   acetaminophen (TYLENOL) 500 MG tablet Take 500 mg by mouth every 6 (six) hours as needed.   amLODipine (NORVASC) 5 MG tablet Take 1 tablet (5 mg total) by mouth daily.   blood glucose meter kit and supplies KIT Dispense based on patient and insurance preference. Use twice daily (before breakfast and before bed).   insulin degludec (TRESIBA FLEXTOUCH) 100 UNIT/ML FlexTouch Pen Inject 15 Units into the skin at bedtime.   Insulin Pen Needle (PEN NEEDLES) 31G X 6 MM MISC Use to inject insulin once daily   metoprolol tartrate (LOPRESSOR) 25 MG tablet TAKE 1 TABLET(25 MG) BY MOUTH TWICE DAILY   pantoprazole (PROTONIX) 40 MG tablet Take 1 tablet (40 mg total) by mouth 2 (two) times daily.   thyroid (NP THYROID) 60 MG tablet TAKE 1 TABLET BY MOUTH EVERY DAY BEFORE BREAKFAST   Vitamin D, Ergocalciferol, (DRISDOL) 1.25 MG (50000 UNIT) CAPS capsule Take 1 capsule (50,000 Units total) by mouth every 7 (seven) days.   [DISCONTINUED] Accu-Chek Softclix Lancets lancets Use as instructed to monitor glucose once daily.   [DISCONTINUED] glipiZIDE (GLUCOTROL XL) 2.5 MG 24 hr tablet Take 1 tablet (2.5 mg total) by mouth daily with breakfast.   [DISCONTINUED] glucose blood (ACCU-CHEK GUIDE) test strip USE TO CHECK BLOOD SUGAR TWICE DAILY, ONCE BEFORE BREAKFAST AND ONCE BEFORE BEDTIME   [DISCONTINUED] metFORMIN (GLUCOPHAGE-XR) 500 MG 24 hr tablet Take 1 tablet (500 mg total) by mouth daily with breakfast.   Accu-Chek Softclix  Lancets lancets Use as instructed to monitor glucose twice daily.   glipiZIDE (GLUCOTROL XL) 2.5 MG 24 hr tablet Take 1 tablet (2.5 mg total) by mouth daily with breakfast.   glucose blood (ACCU-CHEK GUIDE) test strip USE TO CHECK BLOOD SUGAR TWICE DAILY, ONCE BEFORE BREAKFAST AND ONCE BEFORE BEDTIME   [DISCONTINUED] estradiol (ESTRACE) 1 MG tablet TAKE 1 TABLET(1 MG) BY MOUTH DAILY (Patient taking differently: Take 0.5 mg by mouth in the morning.)   [DISCONTINUED] hyoscyamine (LEVSIN SL) 0.125 MG SL tablet Place 1 tablet (0.125 mg total) under the tongue every 8 (eight) hours as needed (abdominal pain).   [DISCONTINUED] oxybutynin (DITROPAN-XL) 10 MG 24 hr tablet Take 1 tablet (10 mg total) by mouth at bedtime.   [DISCONTINUED] sucralfate (CARAFATE) 1 GM/10ML suspension Take 10 mLs (1 g total) by mouth 4 (four) times daily -  with meals and at bedtime. (Patient taking differently: Take 1 g by mouth in the morning and at bedtime.)   No facility-administered encounter medications on file as of 06/10/2023.    ALLERGIES: Allergies  Allergen Reactions   Advair Diskus [Fluticasone-Salmeterol] Anaphylaxis   Codeine Nausea And Vomiting   Hydrocodone Nausea And Vomiting   Levothyroxine Other (See Comments)    Caused lower leg swelling   Oxycodone Nausea And Vomiting   Statins     Muscle and joint pain   Tetracyclines & Related Itching    Internal   VACCINATION STATUS: Immunization History  Administered Date(s) Administered   Fluad Quad(high Dose 65+) 08/17/2019, 07/29/2021   Influenza Split 07/31/2013   Influenza, High Dose Seasonal PF 07/01/2017, 07/14/2020   Influenza-Unspecified 07/09/2017, 08/09/2022   Moderna Sars-Covid-2 Vaccination 11/24/2019, 12/22/2019, 08/30/2020, 02/28/2021   Tdap 07/12/2014  Diabetes She presents for her follow-up diabetic visit. She has type 2 diabetes mellitus. Her disease course has been stable. There are no hypoglycemic associated symptoms. Pertinent  negatives for hypoglycemia include no nervousness/anxiousness. Associated symptoms include fatigue. Pertinent negatives for diabetes include no weight loss. There are no hypoglycemic complications. Symptoms are stable. There are no diabetic complications. Risk factors for coronary artery disease include diabetes mellitus, stress, post-menopausal and hypertension. Current diabetic treatment includes diet and oral agent (monotherapy). She is compliant with treatment most of the time. Her weight is fluctuating minimally. She is following a generally healthy diet. Meal planning includes avoidance of concentrated sweets. She has not had a previous visit with a dietitian. She participates in exercise intermittently. Her home blood glucose trend is increasing steadily. Her breakfast blood glucose range is generally >200 mg/dl. (She presents today with her meter and logs showing gross hyperglycemia overall.  Her POCT A1c today is 8.5%, improving slightly from last visit of 8.7%.  She was briefly taken off Metformin due to stomach issues but restarted once we determined her GI issues were not coming from the Metformin.  Analysis of her meter shows 7-day average of 224, 14-day average of 230, 30-day average of 236, 90-day average of 220.  She does have elevated LFTs, sees GI for this, and is following up with nephrology today for worsening renal failure and recurrent kidney stones.) An ACE inhibitor/angiotensin II receptor blocker is not being taken. She does not see a podiatrist.Eye exam is current.  Thyroid Problem Presents for follow-up visit. Symptoms include depressed mood and fatigue. Patient reports no anxiety, constipation, weight gain or weight loss. The symptoms have been stable.    [name] is a patient with the above medical history. she was diagnosed with parathyroid adenoma in 2013 which required parathyroidectomy done by Dr. Darnell Level.  She is now experiencing similar symptoms that she had at that  time and would like to investigate whether or not there could be a problem with one of her other parathyroid glands.  She thinks she remembers being started on thyroid hormone replacement after her parathyroid removal due to possible injury to the thyroid gland during its removal.  She was a patient of Dr. Karilyn Cota who had started the patient on NP thyroid likely as a result of accidental injury to the thyroid tissue during parathyroidectomy surgery   she was given various doses of NP thyroid over the years, currently on 60 mg. she reports compliance to this medication:  Taking it daily on empty stomach  with water, separated by >30 minutes before breakfast and other medications, and by at least 4 hours from calcium, iron, PPIs, multivitamins .  I reviewed patient's thyroid tests:  Lab Results  Component Value Date   TSH 2.890 06/03/2023   TSH 2.920 10/21/2022   TSH 2.300 01/23/2022   TSH 2.120 10/22/2021   TSH 2.930 07/03/2021   TSH 0.57 10/14/2019   TSH 0.15 (L) 06/29/2019   TSH 2.39 06/23/2018   TSH 2.66 04/16/2017   TSH 1.98 10/14/2016   FREET4 1.11 06/03/2023   FREET4 1.13 10/21/2022   FREET4 1.26 01/23/2022   FREET4 1.26 10/22/2021   FREET4 1.23 07/03/2021   FREET4 1.4 06/23/2018   FREET4 1.4 04/16/2017   FREET4 1.5 10/14/2016   FREET4 1.1 04/04/2016   FREET4 1.5 01/04/2016    Pt denies feeling nodules in neck, hoarseness,  SOB with lying down.  She does report intermittent trouble swallowing pills, something new.  she denies family history of thyroid disorders.  No family history of thyroid cancer.  No history of radiation therapy to head or neck.  No recent use of iodine supplements.  Denies use of Biotin containing supplements.  I reviewed her chart and she also has a history of parathyroid adenoma, controlled Diabetes without medications, GERD, HTN.   Review of systems  Constitutional: + Minimally fluctuating body weight,  current Body mass index is 24.95 kg/m. , no  fatigue, no subjective hyperthermia, no subjective hypothermia Eyes: no blurry vision, no xerophthalmia ENT: no sore throat, no nodules palpated in throat, no dysphagia/odynophagia, no hoarseness Cardiovascular: no chest pain, no shortness of breath, no palpitations, no leg swelling Respiratory: no cough, no shortness of breath Gastrointestinal: no nausea/vomiting/diarrhea,+ dyspepsia -improving since meds were changed Skin: no rashes, no hyperemia Neurological: no tremors, no numbness, no tingling, no dizziness Psychiatric: + depression-stable, no anxiety   Objective:   Objective     BP 138/61 (BP Location: Left Arm, Patient Position: Sitting, Cuff Size: Large)   Pulse 71   Ht 5\' 6"  (1.676 m)   Wt 154 lb 9.6 oz (70.1 kg)   BMI 24.95 kg/m  Wt Readings from Last 3 Encounters:  06/10/23 154 lb 9.6 oz (70.1 kg)  05/16/23 152 lb 9.6 oz (69.2 kg)  03/10/23 150 lb 3.2 oz (68.1 kg)    BP Readings from Last 3 Encounters:  06/10/23 138/61  05/16/23 (!) 109/58  03/19/23 (!) 115/47     Physical Exam- Limited  Constitutional:  Body mass index is 24.95 kg/m. , not in acute distress, normal state of mind Eyes:  EOMI, no exophthalmos Musculoskeletal: no gross deformities, strength intact in all four extremities, no gross restriction of joint movements Skin:  no rashes, no hyperemia Neurological: no tremor with outstretched hands   CMP ( most recent) CMP     Component Value Date/Time   NA 138 06/03/2023 0827   K 4.9 06/03/2023 0827   CL 101 06/03/2023 0827   CO2 24 06/03/2023 0827   GLUCOSE 236 (H) 06/03/2023 0827   GLUCOSE 211 (H) 04/08/2022 0713   BUN 16 06/03/2023 0827   CREATININE 1.04 (H) 06/03/2023 0827   CREATININE 0.81 12/25/2020 1034   CALCIUM 9.8 06/03/2023 0827   PROT 6.3 06/03/2023 0827   ALBUMIN 4.4 06/03/2023 0827   AST 41 (H) 06/03/2023 0827   ALT 81 (H) 06/03/2023 0827   ALKPHOS 81 06/03/2023 0827   BILITOT 0.3 06/03/2023 0827   GFRNONAA >60 11/20/2021  0916   GFRNONAA 73 12/25/2020 1034   GFRAA 85 12/25/2020 1034     Diabetic Labs (most recent): Lab Results  Component Value Date   HGBA1C 8.5 (A) 06/10/2023   HGBA1C 8.7 (A) 03/07/2023   HGBA1C 7.2 (H) 10/21/2022   MICROALBUR 30mg /l 06/25/2022   MICROALBUR 30 07/10/2021   MICROALBUR 0.3 02/18/2018     Lipid Panel ( most recent) Lipid Panel     Component Value Date/Time   CHOL 174 10/21/2022 0837   TRIG 182 (H) 10/21/2022 0837   HDL 37 (L) 10/21/2022 0837   CHOLHDL 4.7 (H) 10/21/2022 0837   CHOLHDL 3.9 12/25/2020 1034   VLDL 33 (H) 10/14/2016 0721   LDLCALC 105 (H) 10/21/2022 0837   LDLCALC 85 12/25/2020 1034   LABVLDL 32 10/21/2022 0837       Lab Results  Component Value Date   TSH 2.890 06/03/2023   TSH 2.920 10/21/2022   TSH 2.300 01/23/2022   TSH 2.120  10/22/2021   TSH 2.930 07/03/2021   TSH 0.57 10/14/2019   TSH 0.15 (L) 06/29/2019   TSH 2.39 06/23/2018   TSH 2.66 04/16/2017   TSH 1.98 10/14/2016   FREET4 1.11 06/03/2023   FREET4 1.13 10/21/2022   FREET4 1.26 01/23/2022   FREET4 1.26 10/22/2021   FREET4 1.23 07/03/2021   FREET4 1.4 06/23/2018   FREET4 1.4 04/16/2017   FREET4 1.5 10/14/2016   FREET4 1.1 04/04/2016   FREET4 1.5 01/04/2016    US Thyroid 06/26/21 CLINICAL DATA:  History of parathyroid adenoma.   EXAM: THYROID ULTRASOUND   TECHNIQUE: Ultrasound examination of the thyroid gland and adjacent soft tissues was performed.   COMPARISON:  04/23/2011   FINDINGS: Parenchymal Echotexture: Mildly heterogenous   Isthmus: 0.1 cm, previously 0.2 cm   Right lobe: 4.6 x 1.9 x 1.4 cm, previously 4.1 x 1.4 x 1.4 cm   Left lobe: 3.1 x 1.0 x 1.1 cm, previously 3.9 x 1.5 x 1.3 cm   _________________________________________________________   Estimated total number of nodules >/= 1 cm: 0   Number of spongiform nodules >/=  2 cm not described below (TR1): 0   Number of mixed cystic and solid nodules >/= 1.5 cm not described below (TR2): 0    _________________________________________________________   No discrete nodules. Previously there was a nodule in the left thyroid lobe that is no longer present.   IMPRESSION: Thyroid tissue is mildly heterogeneous without discrete thyroid nodules.     Electronically Signed   By: Richarda Overlie M.D.   On: 06/26/2021 11:17    Latest Reference Range & Units 07/03/21 09:18 10/22/21 08:55 01/23/22 09:05 10/21/22 08:37  TSH 0.450 - 4.500 uIU/mL 2.930 2.120 2.300 2.920  Triiodothyronine,Free,Serum 2.0 - 4.4 pg/mL 4.4     T4,Free(Direct) 0.82 - 1.77 ng/dL 1.61 0.96 0.45 4.09  Thyroperoxidase Ab SerPl-aCnc 0 - 34 IU/mL <8     Thyroglobulin Antibody 0.0 - 0.9 IU/mL <1.0        Assessment & Plan:   ASSESSMENT / PLAN:  1. History of parathyroid adenoma- s/p parathyroidectomy  -Her recent calcium levels were normal at 9.8.  She does have low vitamin D currently on replacement.  2. Hypothyroidism- unspecified  Patient with long-standing hypothyroidism, on NP thyroid 60 mg.  She historically does not tolerate Levothyroxine (causes significant ankle swelling).  -Her previsit TFTs are consistent with appropriate hormone replacement.  She is advised to continue NP thyroid 60 mg po daily before breakfast.    - We discussed about correct intake of levothyroxine, at fasting, with water, separated by at least 30 minutes from breakfast, and separated by more than 4 hours from calcium, iron, multivitamins, acid reflux medications (PPIs). -Patient is made aware of the fact that thyroid hormone replacement is needed for life, dose to be adjusted by periodic monitoring of thyroid function tests.  3. Uncontrolled Diabetes  She presents today with her meter and logs showing gross hyperglycemia overall.  Her POCT A1c today is 8.5%, improving slightly from last visit of 8.7%.  She was briefly taken off Metformin due to stomach issues but restarted once we determined her GI issues were not coming from the  Metformin.  Analysis of her meter shows 7-day average of 224, 14-day average of 230, 30-day average of 236, 90-day average of 220.  She does have elevated LFTs, sees GI for this, and is following up with nephrology today for worsening renal failure and recurrent kidney stones.  Based on worsening renal function, will  stop her Metformin once again.  She can continue her Glipizide 2.5 mg XL daily with breakfast but will also initiate low dose basal insulin to help regain control and she is willing to do this.  I initiated Tresiba 15 units SQ nightly.    She is advised to check glucose twice daily, before breakfast and before bed, and to call the clinic if she has readings less than 70 or above 300 for 3 tests in a row.  She is asked to reach out in 2 weeks so we can adjust insulin if needed.  Refills for her strips and lancets sent in today.  - Nutritional counseling repeated at each appointment due to patients tendency to fall back in to old habits.  - The patient admits there is a room for improvement in their diet and drink choices. -  Suggestion is made for the patient to avoid simple carbohydrates from their diet including Cakes, Sweet Desserts / Pastries, Ice Cream, Soda (diet and regular), Sweet Tea, Candies, Chips, Cookies, Sweet Pastries, Store Bought Juices, Alcohol in Excess of 1-2 drinks a day, Artificial Sweeteners, Coffee Creamer, and "Sugar-free" Products. This will help patient to have stable blood glucose profile and potentially avoid unintended weight gain.   - I encouraged the patient to switch to unprocessed or minimally processed complex starch and increased protein intake (animal or plant source), fruits, and vegetables.   - Patient is advised to stick to a routine mealtimes to eat 3 meals a day and avoid unnecessary snacks (to snack only to correct hypoglycemia).  4. Vitamin D deficiency Her most recent vitamin D level was 51.2 on 10/21/22.  She reportedly does not tolerate OTC  vitamin D, causes upset stomach and diarrhea.  I refilled her Ergocalciferol 50000 units PO weekly which she is tolerating well.        I spent  46  minutes in the care of the patient today including review of labs from CMP, Lipids, Thyroid Function, Hematology (current and previous including abstractions from other facilities); face-to-face time discussing  her blood glucose readings/logs, discussing hypoglycemia and hyperglycemia episodes and symptoms, medications doses, her options of short and long term treatment based on the latest standards of care / guidelines;  discussion about incorporating lifestyle medicine;  and documenting the encounter. Risk reduction counseling performed per USPSTF guidelines to reduce obesity and cardiovascular risk factors.     Please refer to Patient Instructions for Blood Glucose Monitoring and Insulin/Medications Dosing Guide"  in media tab for additional information. Please  also refer to " Patient Self Inventory" in the Media  tab for reviewed elements of pertinent patient history.  Kimoni S Aydelott participated in the discussions, expressed understanding, and voiced agreement with the above plans.  All questions were answered to her satisfaction. she is encouraged to contact clinic should she have any questions or concerns prior to her return visit.   FOLLOW UP PLAN:  Return in about 3 months (around 09/09/2023) for Diabetes F/U with A1c in office, Previsit labs, Bring meter and logs.   Ronny Bacon, [facility] [address] [place], [place] [address] Phone: [phone] Fax: [phone]  06/10/2023, 10:11 AM

## 2023-06-10 NOTE — Telephone Encounter (Signed)
I sent in for Lantus as an alternative.  The pharmacy did not reach out to let me know the cost would be that much.  Have her reach back out if it is still not affordable.

## 2023-06-10 NOTE — Telephone Encounter (Signed)
Pt states that her [name] is over $400. Pt can not afford this. Please Advise

## 2023-06-11 ENCOUNTER — Encounter: Payer: Self-pay | Admitting: Internal Medicine

## 2023-06-11 ENCOUNTER — Encounter: Payer: Self-pay | Admitting: Nurse Practitioner

## 2023-06-11 LAB — URINALYSIS, ROUTINE W REFLEX MICROSCOPIC
Bilirubin, UA: NEGATIVE
Ketones, UA: NEGATIVE
Leukocytes,UA: NEGATIVE
Nitrite, UA: NEGATIVE
Protein,UA: NEGATIVE
RBC, UA: NEGATIVE
Specific Gravity, UA: 1.025 (ref 1.005–1.030)
Urobilinogen, Ur: 0.2 mg/dL (ref 0.2–1.0)
pH, UA: 6 (ref 5.0–7.5)

## 2023-06-23 ENCOUNTER — Ambulatory Visit (INDEPENDENT_AMBULATORY_CARE_PROVIDER_SITE_OTHER): Payer: Medicare Other | Admitting: Gastroenterology

## 2023-06-25 ENCOUNTER — Telehealth: Payer: Self-pay | Admitting: *Deleted

## 2023-06-25 NOTE — Telephone Encounter (Signed)
Patient called and left a message. She is wanting to share her blood sugar readings since being started on insulin. Pt called with high BG readings. Patient states that she has not been bad.   Date Before breakfast Before lunch Before supper Bedtime  06/23/23 193   198  06/22/23 232   267  09/24/ 207   227  09/25 222       Pt taking: Lantus - patient is injecting 15 units at bedtime.Glipizide 2.5  mg daily.

## 2023-06-25 NOTE — Telephone Encounter (Signed)
Morning readings are still a bit too high.  Have her increase her Lantus to 20 units nightly for a week and reach back out with readings.

## 2023-06-26 NOTE — Telephone Encounter (Signed)
Patient called and a message was left with Whitney's recommendation.

## 2023-07-01 DIAGNOSIS — Z23 Encounter for immunization: Secondary | ICD-10-CM | POA: Diagnosis not present

## 2023-07-03 ENCOUNTER — Ambulatory Visit: Payer: Medicare Other | Admitting: Pulmonary Disease

## 2023-07-03 ENCOUNTER — Encounter (INDEPENDENT_AMBULATORY_CARE_PROVIDER_SITE_OTHER): Payer: Self-pay | Admitting: Gastroenterology

## 2023-07-03 ENCOUNTER — Ambulatory Visit (INDEPENDENT_AMBULATORY_CARE_PROVIDER_SITE_OTHER): Payer: Medicare Other | Admitting: Gastroenterology

## 2023-07-03 VITALS — BP 134/70 | HR 70 | Temp 97.7°F | Ht 66.0 in | Wt 154.5 lb

## 2023-07-03 DIAGNOSIS — K2970 Gastritis, unspecified, without bleeding: Secondary | ICD-10-CM

## 2023-07-03 DIAGNOSIS — K760 Fatty (change of) liver, not elsewhere classified: Secondary | ICD-10-CM | POA: Diagnosis not present

## 2023-07-03 DIAGNOSIS — R7989 Other specified abnormal findings of blood chemistry: Secondary | ICD-10-CM

## 2023-07-03 DIAGNOSIS — K449 Diaphragmatic hernia without obstruction or gangrene: Secondary | ICD-10-CM

## 2023-07-03 DIAGNOSIS — K219 Gastro-esophageal reflux disease without esophagitis: Secondary | ICD-10-CM | POA: Diagnosis not present

## 2023-07-03 DIAGNOSIS — R1013 Epigastric pain: Secondary | ICD-10-CM

## 2023-07-03 NOTE — Progress Notes (Addendum)
Referring Provider: Anabel Halon, MD Primary Care Physician:  Anabel Halon, MD Primary GI Physician: Dr. Levon Hedger   Chief Complaint  Patient presents with   Elevated Hepatic Enzymes    Follow up on elevated liver enzymes.    Gastroesophageal Reflux    Follow up on GERD. Pcp changed her from omeprazole to pantoprazoleone qam. Still having some epigastric.    HPI:   Thurza Kwiecinski Tubby is a 72 y.o. female with past medical history of HTN, GERD, Type II DM, Hypothyroidism, HLD and parathyroid adenoma.   Patient presenting today for follow up of Elevated LFTs and epigastric pain/nausea  Last seen June 2024, at that time having epigasric pain x2-3 weeks, worse with eating, some nausea, having heartburn. Started on zetia for cholesterol in January but only took for a short time, denies any supplements or new meds  Recommended further liver serologies, EGD, start omeprazole, carafate 1g QID  Liver serologies unremarkable, EGD with gastritis with negative biopsies, CT angio of abd/pelv-no mesenteric ischemia   Present: States she is doing better. She is now on protonix 40mg  daily by her PCP. Was doing BID dosing but PCP decreased PPI to daily and she notes doing okay. Very rare heartburn or acid regurgitation. If she has regurgitation at night, she will elevated HOB which helps. Denies nausea or vomiting.  Epigastric pain has improved. Still having some occasional epigastric pain. Appetite is usually pretty good. Weight is stable. No rectal bleeding or melena   She notes that she took dicyclomine and thinks it made her dizzy so she stopped it.    she is under a lot of stress. Having difficulty sleeping.   Last labs on 9/3 with continued elevation of aminotransferases with AST 41 (76), ALT 81 (82) She is having updated labs to include CMP in December.   [place] abd complete: 09/2021 Bilateral renal calculi.  No hydronephrosis. 2. Echogenic liver likely related to diffuse hepatocellular  disease such as fatty infiltration. 3. Cholelithiasis. No additional sonographic evidence for acute cholecystitis. Last Colonoscopy:10/2022- The entire examined colon is normal.                           - Non-bleeding internal hemorrhoids.                           - No specimens collected.  Last Endoscopy:03/2023 1cm hh, gastritis-no h pylori  Recommendations:  Repeat Colonoscopy 5 years   Past Medical History:  Diagnosis Date   Arthritis    Asthma    takes Singulair nightly   Diabetes mellitus    takes levemir daily and Metformin daily   Generalized headaches    GERD (gastroesophageal reflux disease)    takes Omeprazole daily   Herniated disc, cervical    History of kidney stones    Hyperlipidemia    takes Welchol daily   Hypertension    takes Enalapril dialy   Hypothyroidism    takes Synthroid daily   IBS (irritable bowel syndrome)    IPF (idiopathic pulmonary fibrosis) (HCC) 2024   Joint pain    hands and neck   Nocturia    PONV (postoperative nausea and vomiting)    TIA (transient ischemic attack)    diagnosed by EEG but never knew she had it   Trigger finger    Weakness    Weakness    in both hands    Past  Surgical History:  Procedure Laterality Date   ABDOMINAL HYSTERECTOMY  1980   ANTERIOR CERVICAL DECOMP/DISCECTOMY FUSION N/A 10/18/2013   Procedure: Cervical three-four, Cervical four-five, Cervical seven-Thoracic one anterior cervical decompression with fusion plating and bonegraft;  Surgeon: Hewitt Shorts, MD;  Location: [facility];  Service: Neurosurgery;  Laterality: N/A;  Cervical three-four, Cervical four-five, Cervical seven-Thoracic one anterior cervical decompression with fusion plating and bonegraft   APPENDECTOMY     done with hysterectomy   BACK SURGERY  2007   lumbar fusion   BIOPSY  03/19/2023   Procedure: BIOPSY;  Surgeon: Marguerita Merles, Reuel Boom, MD;  Location: [facility];  Service: Gastroenterology;;   carpel tunnel Bilateral     left first then right   cataract implants     CHOLECYSTECTOMY N/A 11/22/2021   Procedure: LAPAROSCOPIC CHOLECYSTECTOMY;  Surgeon: Lewie Chamber, DO;  Location: [facility];  Service: General;  Laterality: N/A;   COLONOSCOPY     COLONOSCOPY WITH PROPOFOL N/A 11/27/2022   Procedure: COLONOSCOPY WITH PROPOFOL;  Surgeon: Dolores Frame, MD;  Location: [facility];  Service: Gastroenterology;  Laterality: N/A;  10:00am, asa 1-2   CYSTOSCOPY WITH RETROGRADE PYELOGRAM, URETEROSCOPY AND STENT PLACEMENT Left 04/08/2022   Procedure: CYSTOSCOPY WITH RETROGRADE PYELOGRAM, URETEROSCOPY AND STENT PLACEMENT;  Surgeon: Marcine Matar, MD;  Location: [facility];  Service: Urology;  Laterality: Left;   ESOPHAGOGASTRODUODENOSCOPY     hx of h pylori   ESOPHAGOGASTRODUODENOSCOPY (EGD) WITH PROPOFOL N/A 03/19/2023   Procedure: ESOPHAGOGASTRODUODENOSCOPY (EGD) WITH PROPOFOL;  Surgeon: Dolores Frame, MD;  Location: [facility];  Service: Gastroenterology;  Laterality: N/A;  10:30am;asa 2   EYE SURGERY  11/18/2008   right - cataracts   EYE SURGERY  06/01/2009   left - cataracts   herniated disc   10/2000, 08/31/2008   C5-C6 then C6-C7   HOLMIUM LASER APPLICATION Left 04/08/2022   Procedure: HOLMIUM LASER APPLICATION;  Surgeon: Marcine Matar, MD;  Location: [facility];  Service: Urology;  Laterality: Left;   IR URETERAL STENT RIGHT NEW ACCESS W/O SEP NEPHROSTOMY CATH  07/26/2021   knee anthroscopy bilateral Bilateral    KNEE ARTHROSCOPY     LITHOTRIPSY  11/12/2007   right side   NEPHROLITHOTOMY Right 07/26/2021   Procedure: NEPHROLITHOTOMY PERCUTANEOUS;  Surgeon: Marcine Matar, MD;  Location: [facility];  Service: Urology;  Laterality: Right;   Parathyroid surgery     October 2013   PARATHYROIDECTOMY  07/02/2012   Procedure: PARATHYROIDECTOMY;  Surgeon: Velora Heckler, MD;  Location: [facility];  Service: General;  Laterality: N/A;   TONSILLECTOMY      as a child    Current Outpatient Medications  Medication Sig Dispense Refill   Accu-Chek Softclix Lancets lancets Use as instructed to monitor glucose twice daily. 100 each 2   acetaminophen (TYLENOL) 500 MG tablet Take 500 mg by mouth every 6 (six) hours as needed.     amLODipine (NORVASC) 5 MG tablet Take 1 tablet (5 mg total) by mouth daily. 90 tablet 1   blood glucose meter kit and supplies KIT Dispense based on patient and insurance preference. Use twice daily (before breakfast and before bed). 1 each 0   glipiZIDE (GLUCOTROL XL) 2.5 MG 24 hr tablet Take 1 tablet (2.5 mg total) by mouth daily with breakfast. 90 tablet 1   glucose blood (ACCU-CHEK GUIDE) test strip USE TO CHECK BLOOD SUGAR TWICE DAILY, ONCE BEFORE BREAKFAST AND ONCE BEFORE BEDTIME 200 strip 1  insulin glargine (LANTUS) 100 unit/mL SOPN Inject 15 Units into the skin at bedtime. 15 mL 3   Insulin Pen Needle (PEN NEEDLES) 31G X 6 MM MISC Use to inject insulin once daily 100 each 3   metoprolol tartrate (LOPRESSOR) 25 MG tablet TAKE 1 TABLET(25 MG) BY MOUTH TWICE DAILY 180 tablet 3   pantoprazole (PROTONIX) 40 MG tablet Take 1 tablet (40 mg total) by mouth 2 (two) times daily. 60 tablet 2   thyroid (NP THYROID) 60 MG tablet TAKE 1 TABLET BY MOUTH EVERY DAY BEFORE BREAKFAST 90 tablet 3   Vitamin D, Ergocalciferol, (DRISDOL) 1.25 MG (50000 UNIT) CAPS capsule Take 1 capsule (50,000 Units total) by mouth every 7 (seven) days. 12 capsule 1   No current facility-administered medications for this visit.    Allergies as of 07/03/2023 - Review Complete 07/03/2023  Allergen Reaction Noted   Advair diskus [fluticasone-salmeterol] Anaphylaxis 05/18/2012   Codeine Nausea And Vomiting 05/18/2012   Hydrocodone Nausea And Vomiting 11/14/2021   Levothyroxine Other (See Comments) 11/14/2021   Oxycodone Nausea And Vomiting 11/14/2021   Statins  10/23/2022   Tetracyclines & related Itching 05/18/2012    Family History  Problem  Relation Age of Onset   Cancer Mother        pancreatic    Social History   Socioeconomic History   Marital status: Widowed    Spouse name: Not on file   Number of children: Not on file   Years of education: Not on file   Highest education level: GED or equivalent  Occupational History   Not on file  Tobacco Use   Smoking status: Former    Current packs/day: 0.00    Average packs/day: 1 pack/day for 40.0 years (40.0 ttl pk-yrs)    Types: Cigarettes    Start date: 07/19/1972    Quit date: 07/19/2012    Years since quitting: 10.9    Passive exposure: Past   Smokeless tobacco: Never   Tobacco comments:    quit smoking in Jul 29, 2012 Vaping Use   Vaping status: Never Used  Substance and Sexual Activity   Alcohol use: No    Alcohol/week: 0.0 standard drinks of alcohol   Drug use: No   Sexual activity: Not Currently    Birth control/protection: Surgical  Other Topics Concern   Not on file  Social History Narrative   Widow,husband died 10-30-2020after 35 years marriage.Retired.   Social Determinants of Health   Financial Resource Strain: Low Risk  (03/07/2023)   Overall Financial Resource Strain (CARDIA)    Difficulty of Paying Living Expenses: Not hard at all  Food Insecurity: No Food Insecurity (03/07/2023)   Hunger Vital Sign    Worried About Running Out of Food in the Last Year: Never true    Ran Out of Food in the Last Year: Never true  Transportation Needs: No Transportation Needs (03/07/2023)   PRAPARE - Administrator, Civil Service (Medical): No    Lack of Transportation (Non-Medical): No  Physical Activity: Insufficiently Active (03/07/2023)   Exercise Vital Sign    Days of Exercise per Week: 2 days    Minutes of Exercise per Session: 60 min  Stress: No Stress Concern Present (03/07/2023)   [employer] of Occupational Health - Occupational Stress Questionnaire    Feeling of Stress : Only a little  Social Connections: Unknown (03/07/2023)   Social  Connection and Isolation Panel [NHANES]    Frequency of Communication with Friends  and Family: Twice a week    Frequency of Social Gatherings with Friends and Family: Twice a week    Attends Religious Services: More than 4 times per year    Active Member of [employer] or Organizations: Patient declined    Attends Banker Meetings: Not on file    Marital Status: Widowed    Review of systems General: negative for malaise, night sweats, fever, chills, weight loss Neck: Negative for lumps, goiter, pain and significant neck swelling Resp: Negative for cough, wheezing, dyspnea at rest CV: Negative for chest pain, leg swelling, palpitations, orthopnea GI: denies melena, hematochezia, nausea, vomiting, diarrhea, constipation, dysphagia, odyonophagia, early satiety or unintentional weight loss. +occasional epigastric pain  MSK: Negative for joint pain or swelling, back pain, and muscle pain. Derm: Negative for itching or rash Psych: Denies depression, anxiety, memory loss, confusion. No homicidal or suicidal ideation.  Heme: Negative for prolonged bleeding, bruising easily, and swollen nodes. Endocrine: Negative for cold or heat intolerance, polyuria, polydipsia and goiter. Neuro: negative for tremor, gait imbalance, syncope and seizures. The remainder of the review of systems is noncontributory.  Physical Exam: BP 134/70 (BP Location: Left Arm, Patient Position: Sitting, Cuff Size: Normal)   Pulse 70   Temp 97.7 F (36.5 C) (Oral)   Ht 5\' 6"  (1.676 m)   Wt 154 lb 8 oz (70.1 kg)   BMI 24.94 kg/m  General:   Alert and oriented. No distress noted. Pleasant and cooperative.  Head:  Normocephalic and atraumatic. Eyes:  Conjuctiva clear without scleral icterus. Mouth:  Oral mucosa pink and moist. Good dentition. No lesions. Heart: Normal rate and rhythm, s1 and s2 heart sounds present.  Lungs: Clear lung sounds in all lobes. Respirations equal and unlabored. Abdomen:  +BS, soft,  non-tender and non-distended. No rebound or guarding. No HSM or masses noted. Derm: No palmar erythema or jaundice Msk:  Symmetrical without gross deformities. Normal posture. Extremities:  Without edema. Neurologic:  Alert and  oriented x4 Psych:  Alert and cooperative. Normal mood and affect.  Invalid input(s): "6 MONTHS"   ASSESSMENT: Nastasia Kage Inskeep is a 72 y.o. female presenting today for follow up of Elevated LFTs and epigastric pain  Epigastric pain: recent EGD with small hh and gastritis. Pain has improved, having this now infrequently. GERD well controlled on protonix 40mg  daily. Appetite is good, weight stable. She did not tolerate dicyclomine as it caused dizziness. Will continue with protonix 40mg  daily for now, if she has recurrence of symptoms, she should let me know   Elevated LFTs: elevated since jan 2024, thought potentially secondary to new statin therapy which she came off of soon after starting. Her aminotransferases continue to be mildly elevated though somewhat better than previously. Liver serologies were all negative, iron panel WNL. She had abd [place] in early 2023 which showed fatty liver. She does not drink etoh or take tylenol frequently. suspect elevated LFTs are likely secondary to fatty liver, her NAFLD score is -0.91 (indeterminate), I recommended proceeding with US liver elastography for further evaluation, patient did not wish to pursue this at this time. I encouraged her to try and implement mediterranean diet and exercise, if she decided she wishes to pursue [place] elastography, she can let me know. She is having update labs in December   PLAN:  Pt to let me know when she has labs drawn to evaluate LFTs  2. Pt to consider [place] elastography  3. Continue to implement diet changes, exercise 4. Continue with  protonix 40mg  daily   All questions were answered, patient verbalized understanding and is in agreement with plan as outlined above.   Follow Up: 6 months    Malynn Lucy L. Jeanmarie Hubert, MSN, APRN, AGNP-C Adult-Gerontology Nurse Practitioner [facility]  I have reviewed the note and agree with the APP's assessment as described in this progress note  Katrinka Blazing, MD Gastroenterology and Hepatology [facility]astro[facility]

## 2023-07-03 NOTE — Patient Instructions (Addendum)
Please let me know when you have labs drawn in december I am providing the mediterranean diet, this is a guideline to help you know which foods you should eat and those you should try to limit or avoid. It is important to make sure you are getting atleast 30 minutes of exercise 4-5x/week You should aim for 5-7% decrease in overall weight. Fatty liver is usually well managed with dietary and lifestyle modifications, however, in rare cases, this can progress to fibrosis/cirrhosis of the liver, which is serious.  Please avoid alcohol as this can worsen liver issues. Continue protonix 40mg  once daily, please let me know if abdominal pain worsens If you decide you would like to proceed with US of the liver, as discussed, please make me aware  Follow up 6 months   It was a pleasure to see you today. I want to create trusting relationships with patients and provide genuine, compassionate, and quality care. I truly value your feedback! please be on the lookout for a survey regarding your visit with me today. I appreciate your input about our visit and your time in completing this!    Ximenna Fonseca L. Jeanmarie Hubert, MSN, APRN, AGNP-C Adult-Gerontology Nurse Practitioner [facility] Gastroenterology at [facility]

## 2023-07-04 ENCOUNTER — Encounter: Payer: Self-pay | Admitting: Nurse Practitioner

## 2023-07-29 ENCOUNTER — Ambulatory Visit (INDEPENDENT_AMBULATORY_CARE_PROVIDER_SITE_OTHER): Payer: Medicare Other | Admitting: Nurse Practitioner

## 2023-07-29 ENCOUNTER — Ambulatory Visit: Payer: Medicare Other | Admitting: Pulmonary Disease

## 2023-07-29 ENCOUNTER — Encounter: Payer: Self-pay | Admitting: Nurse Practitioner

## 2023-07-29 VITALS — BP 124/87 | HR 71 | Ht 66.0 in | Wt 151.0 lb

## 2023-07-29 DIAGNOSIS — J432 Centrilobular emphysema: Secondary | ICD-10-CM | POA: Diagnosis not present

## 2023-07-29 DIAGNOSIS — J849 Interstitial pulmonary disease, unspecified: Secondary | ICD-10-CM

## 2023-07-29 NOTE — Assessment & Plan Note (Signed)
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 No evidence of clinical progression. Again reviewed disease process/prognosis and role of antifibrotic therapy. She is not interested at this time. Will continue with watchful waiting. Plan to repeat PFT and HRCT in April/May 2025 for yearly follow up. Encouraged to remain active.  Patient Instructions  Glad you have been doing well. Let us know if you stat having more trouble with your breathing or new cough so we can see you sooner.  We will plan to repeat your high resolution CT chest and pulmonary function testing in April 2025 for one year follow up. Make sure you have these done before you see Dr. Vassie Loll   Follow up with Dr. Vassie Loll to review CT and PFT end of April/beginning of May 2025 at [facility]. If symptoms worsen, please contact office for sooner follow up or seek emergency care.

## 2023-07-29 NOTE — Patient Instructions (Signed)
Glad you have been doing well. Let us know if you stat having more trouble with your breathing or new cough so we can see you sooner.  We will plan to repeat your high resolution CT chest and pulmonary function testing in April 2025 for one year follow up. Make sure you have these done before you see Dr. Vassie Loll   Follow up with Dr. Vassie Loll to review CT and PFT end of April/beginning of May 2025 at [facility]. If symptoms worsen, please contact office for sooner follow up or seek emergency care.

## 2023-07-29 NOTE — Assessment & Plan Note (Signed)
Mild. No formal obstruction on imaging. No indication for scheduled bronchodilators at this time.

## 2023-07-29 NOTE — Progress Notes (Signed)
@Patient  ID: [name], female    DOB: 12-06-1950, 72 y.o.   MRN: [id number]  Chief Complaint  Patient presents with   Interstitial Lung Disease    Referring provider: Anabel Halon, MD  HPI: 72 year old female, former smoker followed for ILD and asthma. She is a patient of Dr. Reginia Naas and last seen in office 02/06/2023. Past medical history significant for HTN, GERD, hypothyroid, DM, HLD.   TEST/EVENTS:  01/17/2023 HRCT chest: Atherosclerosis.  No LAD.  Mildly enlarged right subcarinal node, unchanged.  Mild paraseptal and centrilobular emphysema with mild diffuse bronchial wall thickening.  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  Categorized as probable UIP.  No significant progression since previous imaging.  Previously described nodular opacities in right lung base are decreased or absent. 02/06/2023 PFT: FVC 84, FEV1 86, ratio 78, TLC 95, DLCOcor 84.  No BD.  Normal pulmonary function testing  02/06/2023: OV with Dr. Vassie Loll.  Initially referred for evaluation of ILD incidentally detected on screening CT scan.  PFTs were normal.  She had high-resolution CT scan that showed probable UIP and resolving nodules, likely inflammatory in nature.  No stigmata of collagen vascular disease.  Serologies checked and negative.  Did not feel like biopsy was necessary.  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  Discussed natural history of this condition.  Reviewed role of antifibrotic's and their side effects.  Prefer not to start this medication after much discussion as she felt like she would not tolerate side effects.  Plan to reassess in 4 months.  07/29/2023: Today-follow-up Patient presents today for follow-up.  Has been doing well since she was here last.  Did have some issues with allergy season but seems to be improving.  Fall tends to be the worst time for her.  Did not require any steroids or antibiotics  recently.  She has occasional shortness of breath that does not impact her day-to-day life.  No issues with cough, chest congestion, wheezing, lower extremity swelling.  She does not use any inhalers right now.  Allergies  Allergen Reactions   Advair Diskus [Fluticasone-Salmeterol] Anaphylaxis   Codeine Nausea And Vomiting   Hydrocodone Nausea And Vomiting   Levothyroxine Other (See Comments)    Caused lower leg swelling   Oxycodone Nausea And Vomiting   Statins     Muscle and joint pain   Tetracyclines & Related Itching    Internal    Immunization History  Administered Date(s) Administered   Fluad Quad(high Dose 65+) 08/17/2019, 07/29/2021   Influenza Split 07/31/2013   Influenza, High Dose Seasonal PF 07/01/2017, 07/14/2020   Influenza-Unspecified 07/09/2017, 08/09/2022   Moderna Sars-Covid-2 Vaccination 11/24/2019, 12/22/2019, 08/30/2020, 02/28/2021   Tdap 07/12/2014    Past Medical History:  Diagnosis Date   Arthritis    Asthma    takes Singulair nightly   Diabetes mellitus    takes levemir daily and Metformin daily   Generalized headaches    GERD (gastroesophageal reflux disease)    takes Omeprazole daily   Herniated disc, cervical    History of kidney stones    Hyperlipidemia    takes Welchol daily   Hypertension    takes Enalapril dialy   Hypothyroidism    takes Synthroid daily   IBS (irritable bowel syndrome)    IPF (idiopathic pulmonary fibrosis) (HCC) 2024   Joint pain    hands and neck   Nocturia    PONV (  postoperative nausea and vomiting)    TIA (transient ischemic attack)    diagnosed by EEG but never knew she had it   Trigger finger    Weakness    Weakness    in both hands    Tobacco History: Social History   Tobacco Use  Smoking Status Former   Current packs/day: 0.00   Average packs/day: 1 pack/day for 40.0 years (40.0 ttl pk-yrs)   Types: Cigarettes   Start date: 07/19/1972   Quit date: 07/19/2012   Years since quitting: 11.0    Passive exposure: Past  Smokeless Tobacco Never  Tobacco Comments   quit smoking in Oct 2013   Counseling given: Not Answered Tobacco comments: quit smoking in Oct 2013   Outpatient Medications Prior to Visit  Medication Sig Dispense Refill   Accu-Chek Softclix Lancets lancets Use as instructed to monitor glucose twice daily. 100 each 2   acetaminophen (TYLENOL) 500 MG tablet Take 500 mg by mouth every 6 (six) hours as needed.     amLODipine (NORVASC) 5 MG tablet Take 1 tablet (5 mg total) by mouth daily. 90 tablet 1   blood glucose meter kit and supplies KIT Dispense based on patient and insurance preference. Use twice daily (before breakfast and before bed). 1 each 0   glipiZIDE (GLUCOTROL XL) 2.5 MG 24 hr tablet Take 1 tablet (2.5 mg total) by mouth daily with breakfast. 90 tablet 1   glucose blood (ACCU-CHEK GUIDE) test strip USE TO CHECK BLOOD SUGAR TWICE DAILY, ONCE BEFORE BREAKFAST AND ONCE BEFORE BEDTIME 200 strip 1   insulin glargine (LANTUS) 100 unit/mL SOPN Inject 15 Units into the skin at bedtime. 15 mL 3   Insulin Pen Needle (PEN NEEDLES) 31G X 6 MM MISC Use to inject insulin once daily 100 each 3   metoprolol tartrate (LOPRESSOR) 25 MG tablet TAKE 1 TABLET(25 MG) BY MOUTH TWICE DAILY 180 tablet 3   pantoprazole (PROTONIX) 40 MG tablet Take 1 tablet (40 mg total) by mouth 2 (two) times daily. 60 tablet 2   thyroid (NP THYROID) 60 MG tablet TAKE 1 TABLET BY MOUTH EVERY DAY BEFORE BREAKFAST 90 tablet 3   Vitamin D, Ergocalciferol, (DRISDOL) 1.25 MG (50000 UNIT) CAPS capsule Take 1 capsule (50,000 Units total) by mouth every 7 (seven) days. 12 capsule 1   No facility-administered medications prior to visit.     Review of Systems:   Constitutional: No weight loss or gain, night sweats, fevers, chills, fatigue, or lassitude. HEENT: No headaches, difficulty swallowing, tooth/dental problems, or sore throat. No itching, ear ache. +allergy related nasal congestion, sneezing CV:   No chest pain, orthopnea, PND, swelling in lower extremities, anasarca, dizziness, palpitations, syncope Resp: +minimal shortness of breath with exertion. No excess mucus or change in color of mucus. No productive or non-productive. No hemoptysis. No wheezing.  No chest wall deformity GI:  No heartburn, indigestion GU: No dysuria, change in color of urine, urgency or frequency.  No flank pain, no hematuria  Skin: No rash, lesions, ulcerations MSK:  No joint pain or swelling.  No decreased range of motion.   Neuro: No dizziness or lightheadedness.  Psych: No depression or anxiety. Mood stable.     Physical Exam:  BP 124/87   Pulse 71   Ht 5\' 6"  (1.676 m)   Wt 151 lb (68.5 kg)   SpO2 95%   BMI 24.37 kg/m   GEN: Pleasant, interactive, well-appearing; in no acute distress HEENT:  Normocephalic and atraumatic. PERRLA.  Sclera white. Nasal turbinates pink, moist and patent bilaterally. No rhinorrhea present. Oropharynx pink and moist, without exudate or edema. No lesions, ulcerations, or postnasal drip.  NECK:  Supple w/ fair ROM. No JVD present. Normal carotid impulses w/o bruits. Thyroid symmetrical with no goiter or nodules palpated. No lymphadenopathy.   CV: RRR, no m/r/g, no peripheral edema. Pulses intact, +2 bilaterally. No cyanosis, pallor or clubbing. PULMONARY:  Unlabored, regular breathing. Clear bilaterally A&P w/o wheezes/rales/rhonchi. No accessory muscle use.  GI: BS present and normoactive. Soft, non-tender to palpation. No organomegaly or masses detected.  MSK: No erythema, warmth or tenderness. Cap refil <2 sec all extrem. No deformities or joint swelling noted.  Neuro: A/Ox3. No focal deficits noted.   Skin: Warm, no lesions or rashe Psych: Normal affect and behavior. Judgement and thought content appropriate.     Lab Results:  CBC    Component Value Date/Time   WBC 5.2 03/04/2023 0841   RBC 5.18 (H) 03/04/2023 0841   HGB 14.6 03/04/2023 0841   HCT 43.9  03/04/2023 0841   PLT 231 03/04/2023 0841   MCV 84.7 03/04/2023 0841   MCH 28.2 03/04/2023 0841   MCHC 33.3 03/04/2023 0841   RDW 12.8 03/04/2023 0841   LYMPHSABS 1.9 05/30/2021 2126   MONOABS 0.6 05/30/2021 2126   EOSABS 0.3 05/30/2021 2126   BASOSABS 0.1 05/30/2021 2126    BMET    Component Value Date/Time   NA 138 06/03/2023 0827   K 4.9 06/03/2023 0827   CL 101 06/03/2023 0827   CO2 24 06/03/2023 0827   GLUCOSE 236 (H) 06/03/2023 0827   GLUCOSE 211 (H) 04/08/2022 0713   BUN 16 06/03/2023 0827   CREATININE 1.04 (H) 06/03/2023 0827   CREATININE 0.81 12/25/2020 1034   CALCIUM 9.8 06/03/2023 0827   GFRNONAA >60 11/20/2021 0916   GFRNONAA 73 12/25/2020 1034   GFRAA 85 12/25/2020 1034    BNP    Component Value Date/Time   BNP 67.0 03/05/2020 0815     Imaging:  No results found.  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 No evidence of clinical progression. Again reviewed disease process/prognosis and role of antifibrotic therapy. She is not interested at this time. Will continue with watchful waiting. Plan to repeat PFT and HRCT in April/May 2025 for yearly follow up. Encouraged to remain active.  Patient Instructions  Glad you have been doing well. Let us know if you stat having more trouble with your breathing or new cough so we can see you  sooner.  We will plan to repeat your high resolution CT chest  and pulmonary function testing in April 2025 for one year follow up. Make sure you have these done before you see Dr. Vassie Loll   Follow up with Dr. Vassie Loll to review CT and PFT end of April/beginning of May 2025 at [facility]. If symptoms worsen, please contact office for sooner follow up or seek emergency care.    Pulmonary emphysema (HCC) Mild. No formal obstruction on imaging. No indication for scheduled bronchodilators at this time.    I spent 28 minutes of dedicated to the care of this patient on the date of this encounter to include pre-visit review of records, face-to-face time with the patient discussing conditions above, post visit ordering of testing, clinical documentation with the electronic health record, making appropriate referrals as documented, and communicating necessary findings to members of the patients care team.  Noemi Chapel, NP 07/29/2023  Pt aware and understands NP's role.

## 2023-09-01 ENCOUNTER — Encounter: Payer: Self-pay | Admitting: Nurse Practitioner

## 2023-09-02 ENCOUNTER — Encounter: Payer: Self-pay | Admitting: Internal Medicine

## 2023-09-02 ENCOUNTER — Ambulatory Visit (INDEPENDENT_AMBULATORY_CARE_PROVIDER_SITE_OTHER): Payer: Medicare Other | Admitting: Internal Medicine

## 2023-09-02 VITALS — BP 128/58 | HR 73 | Ht 66.0 in | Wt 150.4 lb

## 2023-09-02 DIAGNOSIS — I10 Essential (primary) hypertension: Secondary | ICD-10-CM

## 2023-09-02 DIAGNOSIS — K760 Fatty (change of) liver, not elsewhere classified: Secondary | ICD-10-CM

## 2023-09-02 DIAGNOSIS — E1169 Type 2 diabetes mellitus with other specified complication: Secondary | ICD-10-CM

## 2023-09-02 DIAGNOSIS — E039 Hypothyroidism, unspecified: Secondary | ICD-10-CM | POA: Diagnosis not present

## 2023-09-02 DIAGNOSIS — J849 Interstitial pulmonary disease, unspecified: Secondary | ICD-10-CM | POA: Diagnosis not present

## 2023-09-02 DIAGNOSIS — J432 Centrilobular emphysema: Secondary | ICD-10-CM | POA: Diagnosis not present

## 2023-09-02 DIAGNOSIS — F4321 Adjustment disorder with depressed mood: Secondary | ICD-10-CM | POA: Diagnosis not present

## 2023-09-02 DIAGNOSIS — Z7984 Long term (current) use of oral hypoglycemic drugs: Secondary | ICD-10-CM

## 2023-09-02 DIAGNOSIS — E119 Type 2 diabetes mellitus without complications: Secondary | ICD-10-CM | POA: Diagnosis not present

## 2023-09-02 NOTE — Assessment & Plan Note (Signed)
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

## 2023-09-02 NOTE — Assessment & Plan Note (Addendum)
Lab Results  Component Value Date   TSH 2.890 06/03/2023   On NP thyroid 60 mg QD Followed by Endocrinology

## 2023-09-02 NOTE — Assessment & Plan Note (Signed)
Her symptoms are likely due to loneliness around holidays After counseling, she agrees to engage in activities of her liking and be more physically active

## 2023-09-02 NOTE — Assessment & Plan Note (Signed)
BP Readings from Last 1 Encounters:  09/02/23 (!) 128/58   Overall remains well-controlled with Amlodipine 5 mg QD and Metoprolol 25 mg BID Due to borderline low BP, advised to decrease dose of metoprolol to 12.5 mg BID - which was started in 2017 for unclear chest pain and HTN, denies any chest pain or palpitations currently Counseled for compliance with the medications Advised DASH diet and moderate exercise/walking, at least 150 mins/week

## 2023-09-02 NOTE — Progress Notes (Signed)
Established Patient Office Visit  Subjective:  Patient ID: [name], female    DOB: [birth year]  Age: 72 y.o. MRN: [id number]  CC:  Chief Complaint  Patient presents with   Hypertension    Six month follow up    Depression    Patient feels depressed, especially with the holidays coming up since the loss of her husband     HPI [name] is a 72 y.o. female with past medical history of HTN, GERD, type II DM, hypothyroidism, HLD and parathyroid adenoma who presents for f/u of her chronic medical conditions.  HTN: BP is well-controlled. Takes amlodipine 5 mg QD and metoprolol 25 mg BID regularly. Patient denies headache, dizziness, chest pain, dyspnea or palpitations.  She is still on NP thyroid for hypothyroidism.  Followed by endocrinology.  NAFLD: She has elevated liver enzymes.  She also complained of epigastric pain.  She has had better response with pantoprazole compared to omeprazole.  She was seen by GI as well. Denies any nausea or vomiting.  Her [place] of abdomen 01/23 showed hepatic steatosis.  Nephrolithiasis: She had get cystoscopy with stent placement in 07/23. She denies any gross hematuria currently.  Denies any fever, chills, nausea, vomiting or urinary hesitancy.  Adjustment disorder: She has been feeling lonely, and has felt depressed, fatigued and less motivated for her routine activities.  She is alone during the holidays when her friends are traveling.  She denies any SI or HI currently.  Past Medical History:  Diagnosis Date   Arthritis    Asthma    takes Singulair nightly   Diabetes mellitus    takes levemir daily and Metformin daily   Generalized headaches    GERD (gastroesophageal reflux disease)    takes Omeprazole daily   Herniated disc, cervical    History of kidney stones    Hyperlipidemia    takes Welchol daily   Hypertension    takes Enalapril dialy   Hypothyroidism    takes Synthroid daily   IBS (irritable bowel syndrome)    IPF  (idiopathic pulmonary fibrosis) (HCC) 2024   Joint pain    hands and neck   Nocturia    PONV (postoperative nausea and vomiting)    TIA (transient ischemic attack)    diagnosed by EEG but never knew she had it   Trigger finger    Weakness    Weakness    in both hands    Past Surgical History:  Procedure Laterality Date   ABDOMINAL HYSTERECTOMY  1980   ANTERIOR CERVICAL DECOMP/DISCECTOMY FUSION N/A 10/18/2013   Procedure: Cervical three-four, Cervical four-five, Cervical seven-Thoracic one anterior cervical decompression with fusion plating and bonegraft;  Surgeon: Hewitt Shorts, MD;  Location: [facility];  Service: Neurosurgery;  Laterality: N/A;  Cervical three-four, Cervical four-five, Cervical seven-Thoracic one anterior cervical decompression with fusion plating and bonegraft   APPENDECTOMY     done with hysterectomy   BACK SURGERY  2007   lumbar fusion   BIOPSY  03/19/2023   Procedure: BIOPSY;  Surgeon: Marguerita Merles, Reuel Boom, MD;  Location: [facility];  Service: Gastroenterology;;   carpel tunnel Bilateral    left first then right   cataract implants     CHOLECYSTECTOMY N/A 11/22/2021   Procedure: LAPAROSCOPIC CHOLECYSTECTOMY;  Surgeon: Lewie Chamber, DO;  Location: [facility];  Service: General;  Laterality: N/A;   COLONOSCOPY     COLONOSCOPY WITH PROPOFOL N/A 11/27/2022   Procedure: COLONOSCOPY WITH PROPOFOL;  Surgeon: Marguerita Merles, Reuel Boom, MD;  Location: [facility];  Service: Gastroenterology;  Laterality: N/A;  10:00am, asa 1-2   CYSTOSCOPY WITH RETROGRADE PYELOGRAM, URETEROSCOPY AND STENT PLACEMENT Left 04/08/2022   Procedure: CYSTOSCOPY WITH RETROGRADE PYELOGRAM, URETEROSCOPY AND STENT PLACEMENT;  Surgeon: Marcine Matar, MD;  Location: [facility];  Service: Urology;  Laterality: Left;   ESOPHAGOGASTRODUODENOSCOPY     hx of h pylori   ESOPHAGOGASTRODUODENOSCOPY (EGD) WITH PROPOFOL N/A 03/19/2023   Procedure:  ESOPHAGOGASTRODUODENOSCOPY (EGD) WITH PROPOFOL;  Surgeon: Dolores Frame, MD;  [facility];  Service: Gastroenterology;  Laterality: N/A;  10:30am;asa 2   EYE SURGERY  11/18/2008   right - cataracts   EYE SURGERY  06/01/2009   left - cataracts   herniated disc   10/2000, 08/31/2008   C5-C6 then C6-C7   HOLMIUM LASER APPLICATION Left 04/08/2022   Procedure: HOLMIUM LASER APPLICATION;  Surgeon: Marcine Matar, MD;  Location: [facility];  Service: Urology;  Laterality: Left;   IR URETERAL STENT RIGHT NEW ACCESS W/O SEP NEPHROSTOMY CATH  07/26/2021   knee anthroscopy bilateral Bilateral    KNEE ARTHROSCOPY     LITHOTRIPSY  11/12/2007   right side   NEPHROLITHOTOMY Right 07/26/2021   Procedure: NEPHROLITHOTOMY PERCUTANEOUS;  Surgeon: Marcine Matar, MD;  Location: [facility];  Service: Urology;  Laterality: Right;   Parathyroid surgery     07/19/2012  PARATHYROIDECTOMY  07/02/2012   Procedure: PARATHYROIDECTOMY;  Surgeon: Velora Heckler, MD;  Location: [facility];  Service: General;  Laterality: N/A;   TONSILLECTOMY     as a child    Family History  Problem Relation Age of Onset   Cancer Mother        pancreatic    Social History   Socioeconomic History   Marital status: Widowed    Spouse name: Not on file   Number of children: Not on file   Years of education: Not on file   Highest education level: GED or equivalent  Occupational History   Not on file  Tobacco Use   Smoking status: Former    Current packs/day: 0.00    Average packs/day: 1 pack/day for 40.0 years (40.0 ttl pk-yrs)    Types: Cigarettes    Start date: 07/19/1972    Quit date: 07/19/2012    Years since quitting: 11.1    Passive exposure: Past   Smokeless tobacco: Never   Tobacco comments:    quit smoking in 07-19-12 Vaping Use   Vaping status: Never Used  Substance and Sexual Activity   Alcohol use: No    Alcohol/week: 0.0 standard drinks of alcohol   Drug use: No    Sexual activity: Not Currently    Birth control/protection: Surgical  Other Topics Concern   Not on file  Social History Narrative   Widow,husband died 10-20-2020after 35 years marriage.Retired.   Social Determinants of Health   Financial Resource Strain: Low Risk  (08/29/2023)   Overall Financial Resource Strain (CARDIA)    Difficulty of Paying Living Expenses: Not hard at all  Food Insecurity: No Food Insecurity (08/29/2023)   Hunger Vital Sign    Worried About Running Out of Food in the Last Year: Never true    Ran Out of Food in the Last Year: Never true  Transportation Needs: No Transportation Needs (08/29/2023)   PRAPARE - Administrator, Civil Service (Medical): No    Lack of Transportation (Non-Medical): No  Physical Activity: Sufficiently Active (08/29/2023)   Exercise Vital Sign    Days of Exercise per Week: 2 days    Minutes of Exercise per Session: 90 min  Stress: No Stress Concern Present (08/29/2023)   [employer] of Occupational Health - Occupational Stress Questionnaire    Feeling of Stress : Only a little  Social Connections: Moderately Isolated (08/29/2023)   Social Connection and Isolation Panel [NHANES]    Frequency of Communication with Friends and Family: Twice a week    Frequency of Social Gatherings with Friends and Family: Twice a week    Attends Religious Services: More than 4 times per year    Active Member of [employer] or Organizations: No    Attends Banker Meetings: Not on file    Marital Status: Widowed  Intimate Partner Violence: Not At Risk (03/05/2022)   Humiliation, Afraid, Rape, and Kick questionnaire    Fear of Current or Ex-Partner: No    Emotionally Abused: No    Physically Abused: No    Sexually Abused: No    Outpatient Medications Prior to Visit  Medication Sig Dispense Refill   Accu-Chek Softclix Lancets lancets Use as instructed to monitor glucose twice daily. 100 each 2   acetaminophen (TYLENOL) 500  MG tablet Take 500 mg by mouth every 6 (six) hours as needed.     amLODipine (NORVASC) 5 MG tablet Take 1 tablet (5 mg total) by mouth daily. 90 tablet 1   blood glucose meter kit and supplies KIT Dispense based on patient and insurance preference. Use twice daily (before breakfast and before bed). 1 each 0   glipiZIDE (GLUCOTROL XL) 2.5 MG 24 hr tablet Take 1 tablet (2.5 mg total) by mouth daily with breakfast. 90 tablet 1   glucose blood (ACCU-CHEK GUIDE) test strip USE TO CHECK BLOOD SUGAR TWICE DAILY, ONCE BEFORE BREAKFAST AND ONCE BEFORE BEDTIME 200 strip 1   insulin glargine (LANTUS) 100 unit/mL SOPN Inject 15 Units into the skin at bedtime. 15 mL 3   Insulin Pen Needle (PEN NEEDLES) 31G X 6 MM MISC Use to inject insulin once daily 100 each 3   metoprolol tartrate (LOPRESSOR) 25 MG tablet TAKE 1 TABLET(25 MG) BY MOUTH TWICE DAILY 180 tablet 3   pantoprazole (PROTONIX) 40 MG tablet Take 1 tablet (40 mg total) by mouth 2 (two) times daily. 60 tablet 2   thyroid (NP THYROID) 60 MG tablet TAKE 1 TABLET BY MOUTH EVERY DAY BEFORE BREAKFAST 90 tablet 3   Vitamin D, Ergocalciferol, (DRISDOL) 1.25 MG (50000 UNIT) CAPS capsule Take 1 capsule (50,000 Units total) by mouth every 7 (seven) days. 12 capsule 1   No facility-administered medications prior to visit.    Allergies  Allergen Reactions   Advair Diskus [Fluticasone-Salmeterol] Anaphylaxis   Codeine Nausea And Vomiting   Hydrocodone Nausea And Vomiting   Levothyroxine Other (See Comments)    Caused lower leg swelling   Oxycodone Nausea And Vomiting   Statins     Muscle and joint pain   Tetracyclines & Related Itching    Internal    ROS Review of Systems  Constitutional:  Negative for chills and fever.  HENT:  Negative for congestion, sinus pressure, sinus pain and sore throat.   Eyes:  Negative for pain and discharge.  Respiratory:  Negative for cough and shortness of breath.   Cardiovascular:  Negative for chest pain and  palpitations.  Gastrointestinal:  Negative for diarrhea, nausea and vomiting.  Endocrine: Negative for polydipsia  and polyuria.  Genitourinary:  Negative for dysuria and hematuria.  Musculoskeletal:  Positive for arthralgias and myalgias. Negative for neck pain and neck stiffness.  Skin:  Negative for rash.  Neurological:  Negative for dizziness and weakness.  Psychiatric/Behavioral:  Positive for dysphoric mood. Negative for agitation and behavioral problems.       Objective:    Physical Exam Vitals reviewed.  Constitutional:      General: She is not in acute distress.    Appearance: She is not diaphoretic.  HENT:     Head: Normocephalic and atraumatic.     Nose: Nose normal.     Mouth/Throat:     Mouth: Mucous membranes are moist.  Eyes:     General: No scleral icterus.    Extraocular Movements: Extraocular movements intact.  Neck:     Comments: Scar noted, site C/D/I Cardiovascular:     Rate and Rhythm: Normal rate and regular rhythm.     Pulses: Normal pulses.     Heart sounds: Normal heart sounds. No murmur heard. Pulmonary:     Breath sounds: Normal breath sounds. No wheezing or rales.  Musculoskeletal:     Cervical back: Neck supple. No tenderness.     Right lower leg: No edema.     Left lower leg: No edema.  Skin:    General: Skin is warm.     Findings: No rash.  Neurological:     General: No focal deficit present.     Mental Status: She is alert and oriented to person, place, and time.  Psychiatric:        Mood and Affect: Mood normal.        Behavior: Behavior normal.     BP (!) 128/58 (BP Location: Left Arm)   Pulse 73   Ht 5\' 6"  (1.676 m)   Wt 150 lb 6.4 oz (68.2 kg)   SpO2 98%   BMI 24.28 kg/m  Wt Readings from Last 3 Encounters:  09/02/23 150 lb 6.4 oz (68.2 kg)  07/29/23 151 lb (68.5 kg)  07/03/23 154 lb 8 oz (70.1 kg)    Lab Results  Component Value Date   TSH 2.890 06/03/2023   Lab Results  Component Value Date   WBC 5.2 03/04/2023    HGB 14.6 03/04/2023   HCT 43.9 03/04/2023   MCV 84.7 03/04/2023   PLT 231 03/04/2023   Lab Results  Component Value Date   NA 138 06/03/2023   K 4.9 06/03/2023   CO2 24 06/03/2023   GLUCOSE 236 (H) 06/03/2023   BUN 16 06/03/2023   CREATININE 1.04 (H) 06/03/2023   BILITOT 0.3 06/03/2023   ALKPHOS 81 06/03/2023   AST 41 (H) 06/03/2023   ALT 81 (H) 06/03/2023   PROT 6.3 06/03/2023   ALBUMIN 4.4 06/03/2023   CALCIUM 9.8 06/03/2023   ANIONGAP 7 11/20/2021   EGFR 57 (L) 06/03/2023   Lab Results  Component Value Date   CHOL 174 10/21/2022   Lab Results  Component Value Date   HDL 37 (L) 10/21/2022   Lab Results  Component Value Date   LDLCALC 105 (H) 10/21/2022   Lab Results  Component Value Date   TRIG 182 (H) 10/21/2022   Lab Results  Component Value Date   CHOLHDL 4.7 (H) 10/21/2022   Lab Results  Component Value Date   HGBA1C 8.5 (A) 06/10/2023      Assessment & Plan:   Problem List Items Addressed This Visit  Cardiovascular and Mediastinum   Essential hypertension - Primary    BP Readings from Last 1 Encounters:  09/02/23 (!) 128/58   Overall remains well-controlled with Amlodipine 5 mg QD and Metoprolol 25 mg BID Due to borderline low BP, advised to decrease dose of metoprolol to 12.5 mg BID - which was started in 2017 for unclear chest pain and HTN, denies any chest pain or palpitations currently Counseled for compliance with the medications Advised DASH diet and moderate exercise/walking, at least 150 mins/week        Respiratory   Interstitial lung disease (HCC)    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 Followed by pulmonology      Pulmonary emphysema (HCC)    Noted on CT chest Has quit smoking Did not have benefit of bronchodilators noted on PFTs        Digestive   NAFLD (nonalcoholic fatty liver disease)    Recently elevated liver enzymes could be due to NAFLD Needs to continue to follow low-carb diet Followed by GI May need [place]  elastography Advised to cut down estradiol as it could also lead to elevated liver enzymes        Endocrine   Type 2 diabetes mellitus with other specified complication (HCC)    Lab Results  Component Value Date   HGBA1C 8.5 (A) 06/10/2023   Uncontrolled Associated with HLD and HTN Was diet controlled, recently worse - on Lantus 15 U qHS and glipizide 2.5 mg QD Followed by endocrinology Advised to follow strict low-carb diet for now Advised to follow up with Ophthalmology for diabetic eye exam      Relevant Orders   Urine Microalbumin w/creat. ratio   Primary hypothyroidism    Lab Results  Component Value Date   TSH 2.890 06/03/2023   On NP thyroid 60 mg QD Followed by Endocrinology        Other   Adjustment disorder with depressed mood    Her symptoms are likely due to loneliness around holidays After counseling, she agrees to engage in activities of her liking and be more physically active       No orders of the defined types were placed in this encounter.   Follow-up: Return in about 6 months (around 03/02/2024) for HTN and DM.    Anabel Halon, MD

## 2023-09-02 NOTE — Assessment & Plan Note (Signed)
Recently elevated liver enzymes could be due to NAFLD Needs to continue to follow low-carb diet Followed by GI May need [place] elastography Advised to cut down estradiol as it could also lead to elevated liver enzymes

## 2023-09-02 NOTE — Assessment & Plan Note (Signed)
Lab Results  Component Value Date   HGBA1C 8.5 (A) 06/10/2023   Uncontrolled Associated with HLD and HTN Was diet controlled, recently worse - on Lantus 15 U qHS and glipizide 2.5 mg QD Followed by endocrinology Advised to follow strict low-carb diet for now Advised to follow up with Ophthalmology for diabetic eye exam

## 2023-09-02 NOTE — Patient Instructions (Signed)
Please start taking Metoprolol half tablet twice daily and check blood pressure about once a day.  Please ask your insurance about DME agency for continuous glucose monitor.  Please continue to take other medications as prescribed.  Please continue to follow low carb diet and perform moderate exercise/walking at least 150 mins/week.

## 2023-09-02 NOTE — Assessment & Plan Note (Signed)
Noted on CT chest Has quit smoking Did not have benefit of bronchodilators noted on PFTs

## 2023-09-03 LAB — COMPREHENSIVE METABOLIC PANEL
ALT: 35 [IU]/L — ABNORMAL HIGH (ref 0–32)
AST: 28 [IU]/L (ref 0–40)
Albumin: 4.3 g/dL (ref 3.8–4.8)
Alkaline Phosphatase: 70 [IU]/L (ref 44–121)
BUN/Creatinine Ratio: 15 (ref 12–28)
BUN: 16 mg/dL (ref 8–27)
Bilirubin Total: 0.4 mg/dL (ref 0.0–1.2)
CO2: 23 mmol/L (ref 20–29)
Calcium: 9.1 mg/dL (ref 8.7–10.3)
Chloride: 103 mmol/L (ref 96–106)
Creatinine, Ser: 1.08 mg/dL — ABNORMAL HIGH (ref 0.57–1.00)
Globulin, Total: 2 g/dL (ref 1.5–4.5)
Glucose: 101 mg/dL — ABNORMAL HIGH (ref 70–99)
Potassium: 4.5 mmol/L (ref 3.5–5.2)
Sodium: 142 mmol/L (ref 134–144)
Total Protein: 6.3 g/dL (ref 6.0–8.5)
eGFR: 54 mL/min/{1.73_m2} — ABNORMAL LOW (ref >59)

## 2023-09-04 LAB — MICROALBUMIN / CREATININE URINE RATIO
Creatinine, Urine: 121.8 mg/dL
Microalb/Creat Ratio: <2 mg/g{creat} (ref 0–29)
Microalbumin, Urine: <3 ug/mL

## 2023-09-08 ENCOUNTER — Encounter: Payer: Self-pay | Admitting: Nurse Practitioner

## 2023-09-08 NOTE — Progress Notes (Incomplete)
H&P  Chief Complaint: History of kidney stones  History of Present Illness: [name] is a 72 y.o. year old female previously seen-at [facility] for urolithiasis.  She does have a history of hyperparathyroidism, and has had a parathyroid adenectomy by Dr. Gerrit Friends.  Interventions- 10.27.2022: Right percutaneous nephrolithotomy of a 21 mm stone 7.23.2023: Left ureteroscopy with holmium laser of ureteral and renal calculi  24-hour urine studies: Volume-1.1 L Oxalate-124 Sodium urate-3.16 Uric acid-2.67 Stone composition--calcium oxalate monohydrate/calcium oxalate dihydrate-80/20%  Past imaging in March of this year included an ultrasound revealing 7 mm right lower pole stone, perhaps a 7 mm remaining left renal stone in June, she had a CT angiogram of chest abdomen pelvis.  This revealed a couple of punctate calcifications in the left upper pole, 3 mm right lower pole stone.  She has multiple questions about recent laboratories-ALT is elevated, creatinine above his baseline, now at 1.06.  She questions why these have been occurring.  I did not order these labs.  She also has urinary frequency, urgency and occasional urgency incontinence.  Present for quite some time but worsening.  Past Medical History:  Diagnosis Date   Arthritis    Asthma    takes Singulair nightly   Diabetes mellitus    takes levemir daily and Metformin daily   Generalized headaches    GERD (gastroesophageal reflux disease)    takes Omeprazole daily   Herniated disc, cervical    History of kidney stones    Hyperlipidemia    takes Welchol daily   Hypertension    takes Enalapril dialy   Hypothyroidism    takes Synthroid daily   IBS (irritable bowel syndrome)    IPF (idiopathic pulmonary fibrosis) (HCC) 2024   Joint pain    hands and neck   Nocturia    PONV (postoperative nausea and vomiting)    TIA (transient ischemic attack)    diagnosed by EEG but never knew she had it   Trigger finger     Weakness    Weakness    in both hands    Past Surgical History:  Procedure Laterality Date   ABDOMINAL HYSTERECTOMY  1980   ANTERIOR CERVICAL DECOMP/DISCECTOMY FUSION N/A 10/18/2013   Procedure: Cervical three-four, Cervical four-five, Cervical seven-Thoracic one anterior cervical decompression with fusion plating and bonegraft;  Surgeon: Hewitt Shorts, MD;  Location: [facility];  Service: Neurosurgery;  Laterality: N/A;  Cervical three-four, Cervical four-five, Cervical seven-Thoracic one anterior cervical decompression with fusion plating and bonegraft   APPENDECTOMY     done with hysterectomy   BACK SURGERY  2007   lumbar fusion   BIOPSY  03/19/2023   Procedure: BIOPSY;  Surgeon: Marguerita Merles, Reuel Boom, MD;  [facility];  Service: Gastroenterology;;   carpel tunnel Bilateral    left first then right   cataract implants     CHOLECYSTECTOMY N/A 11/22/2021   Procedure: LAPAROSCOPIC CHOLECYSTECTOMY;  Surgeon: Lewie Chamber, DO;  Location: [facility];  Service: General;  Laterality: N/A;   COLONOSCOPY     COLONOSCOPY WITH PROPOFOL N/A 11/27/2022   Procedure: COLONOSCOPY WITH PROPOFOL;  Surgeon: Dolores Frame, MD;  [facility];  Service: Gastroenterology;  Laterality: N/A;  10:00am, asa 1-2   CYSTOSCOPY WITH RETROGRADE PYELOGRAM, URETEROSCOPY AND STENT PLACEMENT Left 04/08/2022   Procedure: CYSTOSCOPY WITH RETROGRADE PYELOGRAM, URETEROSCOPY AND STENT PLACEMENT;  Surgeon: Marcine Matar, MD;  Location: [facility];  Service: Urology;  Laterality: Left;   ESOPHAGOGASTRODUODENOSCOPY  hx of h pylori   ESOPHAGOGASTRODUODENOSCOPY (EGD) WITH PROPOFOL N/A 03/19/2023   Procedure: ESOPHAGOGASTRODUODENOSCOPY (EGD) WITH PROPOFOL;  Surgeon: Dolores Frame, MD;  [facility];  Service: Gastroenterology;  Laterality: N/A;  10:30am;asa 2   EYE SURGERY  11/18/2008   right - cataracts   EYE SURGERY  06/01/2009   left  - cataracts   herniated disc   10/2000, 08/31/2008   C5-C6 then C6-C7   HOLMIUM LASER APPLICATION Left 04/08/2022   Procedure: HOLMIUM LASER APPLICATION;  Surgeon: Marcine Matar, MD;  Location: [facility];  Service: Urology;  Laterality: Left;   IR URETERAL STENT RIGHT NEW ACCESS W/O SEP NEPHROSTOMY CATH  07/26/2021   knee anthroscopy bilateral Bilateral    KNEE ARTHROSCOPY     LITHOTRIPSY  11/12/2007   right side   NEPHROLITHOTOMY Right 07/26/2021   Procedure: NEPHROLITHOTOMY PERCUTANEOUS;  Surgeon: Marcine Matar, MD;  Location: [facility];  Service: Urology;  Laterality: Right;   Parathyroid surgery     October 2013   PARATHYROIDECTOMY  07/02/2012   Procedure: PARATHYROIDECTOMY;  Surgeon: Velora Heckler, MD;  Location: [facility];  Service: General;  Laterality: N/A;   TONSILLECTOMY     as a child    Home Medications:  (Not in a hospital admission)   Allergies:  Allergies  Allergen Reactions   Advair Diskus [Fluticasone-Salmeterol] Anaphylaxis   Codeine Nausea And Vomiting   Hydrocodone Nausea And Vomiting   Levothyroxine Other (See Comments)    Caused lower leg swelling   Oxycodone Nausea And Vomiting   Statins     Muscle and joint pain   Tetracyclines & Related Itching    Internal    Family History  Problem Relation Age of Onset   Cancer Mother        pancreatic    Social History:  reports that she quit smoking about 11 years ago. Her smoking use included cigarettes. She started smoking about 51 years ago. She has a 40 pack-year smoking history. She has been exposed to tobacco smoke. She has never used smokeless tobacco. She reports that she does not drink alcohol and does not use drugs.  ROS: A complete review of systems was performed.  All systems are negative except for pertinent findings as noted.  Physical Exam:  Vital signs in last 24 hours: @VSRANGES @ General:  Alert and oriented, No acute distress HEENT: Normocephalic, atraumatic Neck: No  JVD or lymphadenopathy Cardiovascular: Regular rate  Lungs: Normal inspiratory/expiratory excursion Extremities: No edema Neurologic: Grossly intact  I have reviewed notes from [facility]  I have reviewed urinalysis results  I have independently reviewed prior imaging--recent CT angiogram  I have reviewed recent laboratories  Impression/Assessment:  1.  History of urolithiasis with tiny stones bilaterally, none causing her symptoms  2.  Overactive bladder symptoms, bothersome  Plan:   Chelsea Aus 09/08/2023, 12:28 PM  Bertram Millard. Torria Fromer MD

## 2023-09-09 ENCOUNTER — Ambulatory Visit: Payer: Medicare Other | Admitting: Urology

## 2023-09-09 DIAGNOSIS — Z87442 Personal history of urinary calculi: Secondary | ICD-10-CM

## 2023-09-09 DIAGNOSIS — N3281 Overactive bladder: Secondary | ICD-10-CM

## 2023-09-10 ENCOUNTER — Encounter: Payer: Self-pay | Admitting: Nurse Practitioner

## 2023-09-10 ENCOUNTER — Ambulatory Visit (INDEPENDENT_AMBULATORY_CARE_PROVIDER_SITE_OTHER): Payer: Medicare Other | Admitting: Nurse Practitioner

## 2023-09-10 VITALS — BP 138/78 | HR 74 | Ht 66.0 in | Wt 151.0 lb

## 2023-09-10 DIAGNOSIS — E119 Type 2 diabetes mellitus without complications: Secondary | ICD-10-CM

## 2023-09-10 DIAGNOSIS — E039 Hypothyroidism, unspecified: Secondary | ICD-10-CM | POA: Diagnosis not present

## 2023-09-10 DIAGNOSIS — Z7984 Long term (current) use of oral hypoglycemic drugs: Secondary | ICD-10-CM | POA: Diagnosis not present

## 2023-09-10 DIAGNOSIS — E559 Vitamin D deficiency, unspecified: Secondary | ICD-10-CM

## 2023-09-10 LAB — POCT GLYCOSYLATED HEMOGLOBIN (HGB A1C): Hemoglobin A1C: 7.6 % — AB (ref 4.0–5.6)

## 2023-09-10 MED ORDER — THYROID 60 MG PO TABS: ORAL_TABLET | ORAL | 3 refills | Status: DC

## 2023-09-10 MED ORDER — DEXCOM G7 SENSOR MISC: 1.0000 | 3 refills | Status: DC

## 2023-09-10 NOTE — Progress Notes (Signed)
Endocrinology Follow Up Note                                         09/10/2023, 10:58 AM  Subjective:   Subjective    [name] is a 72 y.o.-year-old female patient being seen in follow up after being seen in consultation for hypothyroidism, diabetes, hx parathyroid adenoma referred by Anabel Halon, MD.   Past Medical History:  Diagnosis Date   Arthritis    Asthma    takes Singulair nightly   Diabetes mellitus    takes levemir daily and Metformin daily   Generalized headaches    GERD (gastroesophageal reflux disease)    takes Omeprazole daily   Herniated disc, cervical    History of kidney stones    Hyperlipidemia    takes Welchol daily   Hypertension    takes Enalapril dialy   Hypothyroidism    takes Synthroid daily   IBS (irritable bowel syndrome)    IPF (idiopathic pulmonary fibrosis) (HCC) 2024   Joint pain    hands and neck   Nocturia    PONV (postoperative nausea and vomiting)    TIA (transient ischemic attack)    diagnosed by EEG but never knew she had it   Trigger finger    Weakness    Weakness    in both hands    Past Surgical History:  Procedure Laterality Date   ABDOMINAL HYSTERECTOMY  1980   ANTERIOR CERVICAL DECOMP/DISCECTOMY FUSION N/A 10/18/2013   Procedure: Cervical three-four, Cervical four-five, Cervical seven-Thoracic one anterior cervical decompression with fusion plating and bonegraft;  Surgeon: Hewitt Shorts, MD;  Location: [facility];  Service: Neurosurgery;  Laterality: N/A;  Cervical three-four, Cervical four-five, Cervical seven-Thoracic one anterior cervical decompression with fusion plating and bonegraft   APPENDECTOMY     done with hysterectomy   BACK SURGERY  2007   lumbar fusion   BIOPSY  03/19/2023   Procedure: BIOPSY;  Surgeon: Marguerita Merles, Reuel Boom, MD;  Location: [facility];  Service: Gastroenterology;;   carpel tunnel Bilateral    left  first then right   cataract implants     CHOLECYSTECTOMY N/A 11/22/2021   Procedure: LAPAROSCOPIC CHOLECYSTECTOMY;  Surgeon: Lewie Chamber, DO;  Location: [facility];  Service: General;  Laterality: N/A;   COLONOSCOPY     COLONOSCOPY WITH PROPOFOL N/A 11/27/2022   Procedure: COLONOSCOPY WITH PROPOFOL;  Surgeon: Dolores Frame, MD;  Location: [facility];  Service: Gastroenterology;  Laterality: N/A;  10:00am, asa 1-2   CYSTOSCOPY WITH RETROGRADE PYELOGRAM, URETEROSCOPY AND STENT PLACEMENT Left 04/08/2022   Procedure: CYSTOSCOPY WITH RETROGRADE PYELOGRAM, URETEROSCOPY AND STENT PLACEMENT;  Surgeon: Marcine Matar, MD;  Location: [facility];  Service: Urology;  Laterality: Left;   ESOPHAGOGASTRODUODENOSCOPY     hx of h pylori   ESOPHAGOGASTRODUODENOSCOPY (EGD) WITH PROPOFOL N/A 03/19/2023   Procedure: ESOPHAGOGASTRODUODENOSCOPY (EGD) WITH PROPOFOL;  Surgeon: Dolores Frame, MD;  Location: [facility];  Service: Gastroenterology;  Laterality: N/A;  10:30am;asa 2   EYE SURGERY  11/18/2008   right - cataracts   EYE SURGERY  06/01/2009   left - cataracts   herniated disc   10/2000, 08/31/2008   C5-C6 then C6-C7   HOLMIUM LASER APPLICATION Left 04/08/2022   Procedure: HOLMIUM LASER APPLICATION;  Surgeon: Marcine Matar, MD;  Location: [facility];  Service: Urology;  Laterality: Left;   IR URETERAL STENT RIGHT NEW ACCESS W/O SEP NEPHROSTOMY CATH  07/26/2021   knee anthroscopy bilateral Bilateral    KNEE ARTHROSCOPY     LITHOTRIPSY  11/12/2007   right side   NEPHROLITHOTOMY Right 07/26/2021   Procedure: NEPHROLITHOTOMY PERCUTANEOUS;  Surgeon: Marcine Matar, MD;  Location: [facility];  Service: Urology;  Laterality: Right;   Parathyroid surgery     Aug 10, 2012  PARATHYROIDECTOMY  07/02/2012   Procedure: PARATHYROIDECTOMY;  Surgeon: Velora Heckler, MD;  Location: [facility];  Service: General;  Laterality: N/A;   TONSILLECTOMY     as a  child    Social History   Socioeconomic History   Marital status: Widowed    Spouse name: Not on file   Number of children: Not on file   Years of education: Not on file   Highest education level: GED or equivalent  Occupational History   Not on file  Tobacco Use   Smoking status: Former    Current packs/day: 0.00    Average packs/day: 1 pack/day for 40.0 years (40.0 ttl pk-yrs)    Types: Cigarettes    Start date: 07/19/1972    Quit date: 07/19/2012    Years since quitting: 11.1    Passive exposure: Past   Smokeless tobacco: Never   Tobacco comments:    quit smoking in 08-10-12 Vaping Use   Vaping status: Never Used  Substance and Sexual Activity   Alcohol use: No    Alcohol/week: 0.0 standard drinks of alcohol   Drug use: No   Sexual activity: Not Currently    Birth control/protection: Surgical  Other Topics Concern   Not on file  Social History Narrative   Widow,husband died November 11, 2020after 35 years marriage.Retired.   Social Determinants of Health   Financial Resource Strain: Low Risk  (08/29/2023)   Overall Financial Resource Strain (CARDIA)    Difficulty of Paying Living Expenses: Not hard at all  Food Insecurity: No Food Insecurity (08/29/2023)   Hunger Vital Sign    Worried About Running Out of Food in the Last Year: Never true    Ran Out of Food in the Last Year: Never true  Transportation Needs: No Transportation Needs (08/29/2023)   PRAPARE - Administrator, Civil Service (Medical): No    Lack of Transportation (Non-Medical): No  Physical Activity: Sufficiently Active (08/29/2023)   Exercise Vital Sign    Days of Exercise per Week: 2 days    Minutes of Exercise per Session: 90 min  Stress: No Stress Concern Present (08/29/2023)   [employer] of Occupational Health - Occupational Stress Questionnaire    Feeling of Stress : Only a little  Social Connections: Moderately Isolated (08/29/2023)   Social Connection and Isolation Panel  [NHANES]    Frequency of Communication with Friends and Family: Twice a week    Frequency of Social Gatherings with Friends and Family: Twice a week    Attends Religious Services: More than 4 times per year    Active Member of Clubs or Organizations: No  Attends Banker Meetings: Not on file    Marital Status: Widowed    Family History  Problem Relation Age of Onset   Cancer Mother        pancreatic    Outpatient Encounter Medications as of 09/10/2023  Medication Sig   Accu-Chek Softclix Lancets lancets Use as instructed to monitor glucose twice daily.   acetaminophen (TYLENOL) 500 MG tablet Take 500 mg by mouth every 6 (six) hours as needed.   amLODipine (NORVASC) 5 MG tablet Take 1 tablet (5 mg total) by mouth daily.   blood glucose meter kit and supplies KIT Dispense based on patient and insurance preference. Use twice daily (before breakfast and before bed).   Continuous Glucose Sensor (DEXCOM G7 SENSOR) MISC Inject 1 Application into the skin as directed. Change sensor every 10 days as directed.   glucose blood (ACCU-CHEK GUIDE) test strip USE TO CHECK BLOOD SUGAR TWICE DAILY, ONCE BEFORE BREAKFAST AND ONCE BEFORE BEDTIME   insulin glargine (LANTUS) 100 unit/mL SOPN Inject 15 Units into the skin at bedtime.   Insulin Pen Needle (PEN NEEDLES) 31G X 6 MM MISC Use to inject insulin once daily   metoprolol tartrate (LOPRESSOR) 25 MG tablet TAKE 1 TABLET(25 MG) BY MOUTH TWICE DAILY   pantoprazole (PROTONIX) 40 MG tablet Take 1 tablet (40 mg total) by mouth 2 (two) times daily.   Vitamin D, Ergocalciferol, (DRISDOL) 1.25 MG (50000 UNIT) CAPS capsule Take 1 capsule (50,000 Units total) by mouth every 7 (seven) days.   [DISCONTINUED] thyroid (NP THYROID) 60 MG tablet TAKE 1 TABLET BY MOUTH EVERY DAY BEFORE BREAKFAST   glipiZIDE (GLUCOTROL XL) 2.5 MG 24 hr tablet Take 1 tablet (2.5 mg total) by mouth daily with breakfast. (Patient not taking: Reported on 09/10/2023)   thyroid  (NP THYROID) 60 MG tablet TAKE 1 TABLET BY MOUTH EVERY DAY BEFORE BREAKFAST   No facility-administered encounter medications on file as of 09/10/2023.    ALLERGIES: Allergies  Allergen Reactions   Advair Diskus [Fluticasone-Salmeterol] Anaphylaxis   Codeine Nausea And Vomiting   Hydrocodone Nausea And Vomiting   Levothyroxine Other (See Comments)    Caused lower leg swelling   Oxycodone Nausea And Vomiting   Statins     Muscle and joint pain   Tetracyclines & Related Itching    Internal   VACCINATION STATUS: Immunization History  Administered Date(s) Administered   Fluad Quad(high Dose 65+) 08/17/2019, 07/29/2021   Influenza Split 07/31/2013   Influenza, High Dose Seasonal PF 07/01/2017, 07/14/2020   Influenza-Unspecified 07/09/2017, 08/09/2022   Moderna Sars-Covid-2 Vaccination 11/24/2019, 12/22/2019, 08/30/2020, 02/28/2021   Tdap 07/12/2014     Diabetes She presents for her follow-up diabetic visit. She has type 2 diabetes mellitus. Her disease course has been fluctuating. There are no hypoglycemic associated symptoms. Pertinent negatives for hypoglycemia include no nervousness/anxiousness. Associated symptoms include fatigue. Pertinent negatives for diabetes include no weight loss. There are no hypoglycemic complications. Symptoms are stable. There are no diabetic complications. Risk factors for coronary artery disease include diabetes mellitus, stress, post-menopausal and hypertension. Current diabetic treatment includes oral agent (monotherapy) and insulin injections. She is compliant with treatment most of the time. Her weight is fluctuating minimally. She is following a generally healthy diet. Meal planning includes avoidance of concentrated sweets. She has not had a previous visit with a dietitian. She participates in exercise intermittently. Her home blood glucose trend is increasing steadily. Her breakfast blood glucose range is generally >200 mg/dl. Her bedtime blood  glucose  range is generally >200 mg/dl. Her overall blood glucose range is >200 mg/dl. (She presents today with her meter and logs showing above target glycemic profile overall.  Her POCT A1c today is 7.6%, improving slightly from last visit of 8.5%.  She notes she stopped taking her Glipizide about a week or so ago to see if it was really helping but has also developed a head cold so she isn't sure if the glucose elevation is related to stopping Glipizide or the cold.  She also notes she is ready to discuss CGM options to help manage her diabetes.) An ACE inhibitor/angiotensin II receptor blocker is not being taken. She does not see a podiatrist.Eye exam is current.  Thyroid Problem Presents for follow-up visit. Symptoms include depressed mood and fatigue. Patient reports no anxiety, constipation, weight gain or weight loss. The symptoms have been stable.    [name] is a patient with the above medical history. she was diagnosed with parathyroid adenoma in 2013 which required parathyroidectomy done by Dr. Darnell Level.  She is now experiencing similar symptoms that she had at that time and would like to investigate whether or not there could be a problem with one of her other parathyroid glands.  She thinks she remembers being started on thyroid hormone replacement after her parathyroid removal due to possible injury to the thyroid gland during its removal.  She was a patient of Dr. Karilyn Cota who had started the patient on NP thyroid likely as a result of accidental injury to the thyroid tissue during parathyroidectomy surgery   she was given various doses of NP thyroid over the years, currently on 60 mg. she reports compliance to this medication:  Taking it daily on empty stomach  with water, separated by >30 minutes before breakfast and other medications, and by at least 4 hours from calcium, iron, PPIs, multivitamins .  I reviewed patient's thyroid tests:  Lab Results  Component Value Date   TSH  2.890 06/03/2023   TSH 2.920 10/21/2022   TSH 2.300 01/23/2022   TSH 2.120 10/22/2021   TSH 2.930 07/03/2021   TSH 0.57 10/14/2019   TSH 0.15 (L) 06/29/2019   TSH 2.39 06/23/2018   TSH 2.66 04/16/2017   TSH 1.98 10/14/2016   FREET4 1.11 06/03/2023   FREET4 1.13 10/21/2022   FREET4 1.26 01/23/2022   FREET4 1.26 10/22/2021   FREET4 1.23 07/03/2021   FREET4 1.4 06/23/2018   FREET4 1.4 04/16/2017   FREET4 1.5 10/14/2016   FREET4 1.1 04/04/2016   FREET4 1.5 01/04/2016    Pt denies feeling nodules in neck, hoarseness,  SOB with lying down.  She does report intermittent trouble swallowing pills, something new.  she denies family history of thyroid disorders.  No family history of thyroid cancer.  No history of radiation therapy to head or neck.  No recent use of iodine supplements.  Denies use of Biotin containing supplements.  I reviewed her chart and she also has a history of parathyroid adenoma, controlled Diabetes without medications, GERD, HTN.   Review of systems  Constitutional: + Minimally fluctuating body weight,  current Body mass index is 24.37 kg/m. , no fatigue, no subjective hyperthermia, no subjective hypothermia, reports head/nasal congestion from cold Eyes: no blurry vision, no xerophthalmia ENT: no sore throat, no nodules palpated in throat, no dysphagia/odynophagia, no hoarseness Cardiovascular: no chest pain, no shortness of breath, no palpitations, no leg swelling Respiratory: no cough, no shortness of breath Gastrointestinal: no nausea/vomiting/diarrhea,+ dyspepsia -improving since meds  were changed Skin: no rashes, no hyperemia Neurological: no tremors, no numbness, no tingling, no dizziness Psychiatric: + depression-stable, no anxiety   Objective:   Objective     BP 138/78 (BP Location: Right Arm, Patient Position: Sitting, Cuff Size: Large)   Pulse 74   Ht 5\' 6"  (1.676 m)   Wt 151 lb (68.5 kg)   BMI 24.37 kg/m  Wt Readings from Last 3  Encounters:  09/10/23 151 lb (68.5 kg)  09/02/23 150 lb 6.4 oz (68.2 kg)  07/29/23 151 lb (68.5 kg)    BP Readings from Last 3 Encounters:  09/10/23 138/78  09/02/23 (!) 128/58  07/29/23 124/87     Physical Exam- Limited  Constitutional:  Body mass index is 24.37 kg/m. , not in acute distress, normal state of mind Eyes:  EOMI, no exophthalmos Musculoskeletal: no gross deformities, strength intact in all four extremities, no gross restriction of joint movements Skin:  no rashes, no hyperemia Neurological: no tremor with outstretched hands   Diabetic Foot Exam - Simple   No data filed     CMP ( most recent) CMP     Component Value Date/Time   NA 142 09/02/2023 0942   K 4.5 09/02/2023 0942   CL 103 09/02/2023 0942   CO2 23 09/02/2023 0942   GLUCOSE 101 (H) 09/02/2023 0942   GLUCOSE 211 (H) 04/08/2022 0713   BUN 16 09/02/2023 0942   CREATININE 1.08 (H) 09/02/2023 0942   CREATININE 0.81 12/25/2020 1034   CALCIUM 9.1 09/02/2023 0942   PROT 6.3 09/02/2023 0942   ALBUMIN 4.3 09/02/2023 0942   AST 28 09/02/2023 0942   ALT 35 (H) 09/02/2023 0942   ALKPHOS 70 09/02/2023 0942   BILITOT 0.4 09/02/2023 0942   GFRNONAA >60 11/20/2021 0916   GFRNONAA 73 12/25/2020 1034   GFRAA 85 12/25/2020 1034     Diabetic Labs (most recent): Lab Results  Component Value Date   HGBA1C 7.6 (A) 09/10/2023   HGBA1C 8.5 (A) 06/10/2023   HGBA1C 8.7 (A) 03/07/2023   MICROALBUR 30mg /l 06/25/2022   MICROALBUR 30 07/10/2021   MICROALBUR 0.3 02/18/2018     Lipid Panel ( most recent) Lipid Panel     Component Value Date/Time   CHOL 174 10/21/2022 0837   TRIG 182 (H) 10/21/2022 0837   HDL 37 (L) 10/21/2022 0837   CHOLHDL 4.7 (H) 10/21/2022 0837   CHOLHDL 3.9 12/25/2020 1034   VLDL 33 (H) 10/14/2016 0721   LDLCALC 105 (H) 10/21/2022 0837   LDLCALC 85 12/25/2020 1034   LABVLDL 32 10/21/2022 0837       Lab Results  Component Value Date   TSH 2.890 06/03/2023   TSH 2.920  10/21/2022   TSH 2.300 01/23/2022   TSH 2.120 10/22/2021   TSH 2.930 07/03/2021   TSH 0.57 10/14/2019   TSH 0.15 (L) 06/29/2019   TSH 2.39 06/23/2018   TSH 2.66 04/16/2017   TSH 1.98 10/14/2016   FREET4 1.11 06/03/2023   FREET4 1.13 10/21/2022   FREET4 1.26 01/23/2022   FREET4 1.26 10/22/2021   FREET4 1.23 07/03/2021   FREET4 1.4 06/23/2018   FREET4 1.4 04/16/2017   FREET4 1.5 10/14/2016   FREET4 1.1 04/04/2016   FREET4 1.5 01/04/2016    US Thyroid 06/26/21 CLINICAL DATA:  History of parathyroid adenoma.   EXAM: THYROID ULTRASOUND   TECHNIQUE: Ultrasound examination of the thyroid gland and adjacent soft tissues was performed.   COMPARISON:  04/23/2011   FINDINGS: Parenchymal Echotexture: Mildly heterogenous  Isthmus: 0.1 cm, previously 0.2 cm   Right lobe: 4.6 x 1.9 x 1.4 cm, previously 4.1 x 1.4 x 1.4 cm   Left lobe: 3.1 x 1.0 x 1.1 cm, previously 3.9 x 1.5 x 1.3 cm   _________________________________________________________   Estimated total number of nodules >/= 1 cm: 0   Number of spongiform nodules >/=  2 cm not described below (TR1): 0   Number of mixed cystic and solid nodules >/= 1.5 cm not described below (TR2): 0   _________________________________________________________   No discrete nodules. Previously there was a nodule in the left thyroid lobe that is no longer present.   IMPRESSION: Thyroid tissue is mildly heterogeneous without discrete thyroid nodules.     Electronically Signed   By: Richarda Overlie M.D.   On: 06/26/2021 11:17    Latest Reference Range & Units 07/03/21 09:18 10/22/21 08:55 01/23/22 09:05 10/21/22 08:37  TSH 0.450 - 4.500 uIU/mL 2.930 2.120 2.300 2.920  Triiodothyronine,Free,Serum 2.0 - 4.4 pg/mL 4.4     T4,Free(Direct) 0.82 - 1.77 ng/dL 8.65 7.84 6.96 2.95  Thyroperoxidase Ab SerPl-aCnc 0 - 34 IU/mL <8     Thyroglobulin Antibody 0.0 - 0.9 IU/mL <1.0        Assessment & Plan:   ASSESSMENT / PLAN:  1. History of  parathyroid adenoma- s/p parathyroidectomy  -Her recent calcium levels were normal at 9.1.  She does have low vitamin D currently on replacement.  2. Hypothyroidism- unspecified  Patient with long-standing hypothyroidism, on NP thyroid 60 mg.  She historically does not tolerate Levothyroxine (causes significant ankle swelling).  -There are no recent TFTs to review.  She is advised to continue NP thyroid 60 mg po daily before breakfast.  Will recheck TFTs prior to next visit.  - We discussed about correct intake of levothyroxine, at fasting, with water, separated by at least 30 minutes from breakfast, and separated by more than 4 hours from calcium, iron, multivitamins, acid reflux medications (PPIs). -Patient is made aware of the fact that thyroid hormone replacement is needed for life, dose to be adjusted by periodic monitoring of thyroid function tests.  3. Uncontrolled Diabetes  She presents today with her meter and logs showing above target glycemic profile overall.  Her POCT A1c today is 7.6%, improving slightly from last visit of 8.5%.  She notes she stopped taking her Glipizide about a week or so ago to see if it was really helping but has also developed a head cold so she isn't sure if the glucose elevation is related to stopping Glipizide or the cold.  She also notes she is ready to discuss CGM options to help manage her diabetes.  She is advised to continue Lantus 25 units SQ nightly and restart her Glipizide 2.5 mg XL daily with breakfast.  Will keep her off Metformin for now due to decline in kidney function.  She is advised to check glucose twice daily, before breakfast and before bed, and to call the clinic if she has readings less than 70 or above 300 for 3 tests in a row.  I did send in Dexcom G7 for her and gave her a sample to get started.  She could certainly benefit from CGM use with insulin administration.  - Nutritional counseling repeated at each appointment due to  patients tendency to fall back in to old habits.  - The patient admits there is a room for improvement in their diet and drink choices. -  Suggestion is made for the patient  to avoid simple carbohydrates from their diet including Cakes, Sweet Desserts / Pastries, Ice Cream, Soda (diet and regular), Sweet Tea, Candies, Chips, Cookies, Sweet Pastries, Store Bought Juices, Alcohol in Excess of 1-2 drinks a day, Artificial Sweeteners, Coffee Creamer, and "Sugar-free" Products. This will help patient to have stable blood glucose profile and potentially avoid unintended weight gain.   - I encouraged the patient to switch to unprocessed or minimally processed complex starch and increased protein intake (animal or plant source), fruits, and vegetables.   - Patient is advised to stick to a routine mealtimes to eat 3 meals a day and avoid unnecessary snacks (to snack only to correct hypoglycemia).  4. Vitamin D deficiency Her most recent vitamin D level was 51.2 on 10/21/22.  She reportedly does not tolerate OTC vitamin D, causes upset stomach and diarrhea.  I refilled her Ergocalciferol 50000 units PO weekly which she is tolerating well.        I spent  44  minutes in the care of the patient today including review of labs from CMP, Lipids, Thyroid Function, Hematology (current and previous including abstractions from other facilities); face-to-face time discussing  her blood glucose readings/logs, discussing hypoglycemia and hyperglycemia episodes and symptoms, medications doses, her options of short and long term treatment based on the latest standards of care / guidelines;  discussion about incorporating lifestyle medicine;  and documenting the encounter. Risk reduction counseling performed per USPSTF guidelines to reduce obesity and cardiovascular risk factors.     Please refer to Patient Instructions for Blood Glucose Monitoring and Insulin/Medications Dosing Guide"  in media tab for additional  information. Please  also refer to " Patient Self Inventory" in the Media  tab for reviewed elements of pertinent patient history.  Clorine S Colvard participated in the discussions, expressed understanding, and voiced agreement with the above plans.  All questions were answered to her satisfaction. she is encouraged to contact clinic should she have any questions or concerns prior to her return visit.   FOLLOW UP PLAN:  Return in about 3 months (around 12/09/2023) for Diabetes F/U with A1c in office, Thyroid follow up, Previsit labs, Bring meter and logs.   Ronny Bacon, [facility] [address] [place], [place] [address] Phone: [phone] Fax: [phone]  09/10/2023, 10:58 AM

## 2023-09-11 MED ORDER — ACCU-CHEK SOFTCLIX LANCETS MISC: 11 refills | Status: DC

## 2023-09-11 MED ORDER — ACCU-CHEK GUIDE TEST VI STRP: ORAL_STRIP | 11 refills | Status: DC

## 2023-09-12 NOTE — Telephone Encounter (Signed)
Patient states pharmacy is requiring a PA for her Dexcom.

## 2023-09-15 NOTE — Telephone Encounter (Signed)
Patient will be stopping by for a Dexcom sensor- her sample I gave her fell off

## 2023-09-16 ENCOUNTER — Other Ambulatory Visit (HOSPITAL_COMMUNITY): Payer: Self-pay

## 2023-09-16 ENCOUNTER — Telehealth: Payer: Self-pay

## 2023-09-16 ENCOUNTER — Encounter: Payer: Self-pay | Admitting: Internal Medicine

## 2023-09-16 NOTE — Telephone Encounter (Signed)
Pharmacy Patient Advocate Encounter   Received notification from Patient Advice Request messages that prior authorization for Dexcom sensors is required/requested.   I am unable to find any pharmacy benefit coverage, only Medicare part A and B. Since this is a CGM, it might be covered under part B if sent to a DME supplier. Please advise

## 2023-09-17 ENCOUNTER — Other Ambulatory Visit: Payer: Self-pay | Admitting: Internal Medicine

## 2023-09-17 ENCOUNTER — Encounter: Payer: Self-pay | Admitting: Nurse Practitioner

## 2023-09-17 DIAGNOSIS — J0110 Acute frontal sinusitis, unspecified: Secondary | ICD-10-CM

## 2023-09-17 MED ORDER — AMOXICILLIN-POT CLAVULANATE 875-125 MG PO TABS: 1.0000 | ORAL_TABLET | Freq: Two times a day (BID) | ORAL | 0 refills | Status: DC

## 2023-09-17 MED ORDER — PROMETHAZINE-DM 6.25-15 MG/5ML PO SYRP: 5.0000 mL | ORAL_SOLUTION | Freq: Four times a day (QID) | ORAL | 0 refills | Status: DC | PRN

## 2023-09-17 NOTE — Telephone Encounter (Signed)
Whitney , should we send this to DME?

## 2023-09-18 ENCOUNTER — Other Ambulatory Visit: Payer: Self-pay | Admitting: *Deleted

## 2023-09-18 DIAGNOSIS — E119 Type 2 diabetes mellitus without complications: Secondary | ICD-10-CM

## 2023-09-18 MED ORDER — DEXCOM G7 SENSOR MISC: 1.0000 | 3 refills | Status: DC

## 2023-09-18 NOTE — Telephone Encounter (Signed)
Kim helped with this and it was sent to DME .

## 2023-09-18 NOTE — Telephone Encounter (Signed)
Yes, to [employer]

## 2023-10-07 ENCOUNTER — Ambulatory Visit (INDEPENDENT_AMBULATORY_CARE_PROVIDER_SITE_OTHER): Payer: Medicare Other

## 2023-10-07 ENCOUNTER — Other Ambulatory Visit: Payer: Self-pay | Admitting: Internal Medicine

## 2023-10-07 DIAGNOSIS — J0110 Acute frontal sinusitis, unspecified: Secondary | ICD-10-CM | POA: Diagnosis not present

## 2023-10-07 DIAGNOSIS — J432 Centrilobular emphysema: Secondary | ICD-10-CM

## 2023-10-07 MED ORDER — AZITHROMYCIN 250 MG PO TABS: ORAL_TABLET | ORAL | 0 refills | Status: AC

## 2023-10-07 MED ORDER — METHYLPREDNISOLONE 4 MG PO TBPK: ORAL_TABLET | ORAL | 0 refills | Status: DC

## 2023-10-07 MED ORDER — METHYLPREDNISOLONE ACETATE 80 MG/ML IJ SUSP
40.0000 mg | Freq: Once | INTRAMUSCULAR | Status: AC
  Administered 2023-10-07: 40 mg via INTRAMUSCULAR

## 2023-10-12 ENCOUNTER — Other Ambulatory Visit: Payer: Self-pay | Admitting: Internal Medicine

## 2023-10-12 DIAGNOSIS — K219 Gastro-esophageal reflux disease without esophagitis: Secondary | ICD-10-CM

## 2023-11-05 ENCOUNTER — Telehealth: Payer: Self-pay | Admitting: Nurse Practitioner

## 2023-11-05 NOTE — Telephone Encounter (Signed)
 LVM for patient to call and discuss scheduling the PFT in April 2025 ordered by Girard Lam, NP

## 2023-11-05 NOTE — Telephone Encounter (Signed)
 Spoke with patient regarding the PFT ordered by Comer Rouleau, NP---scheduled Thursday 01/01/24 at 11:30 am at [facility] time is 11:15 am 1st floor registration desk---for check in---will mail information to patient and she voiced her understanding

## 2023-11-10 ENCOUNTER — Encounter: Payer: Self-pay | Admitting: Internal Medicine

## 2023-11-11 ENCOUNTER — Ambulatory Visit: Payer: Medicare Other | Admitting: Urology

## 2023-11-11 ENCOUNTER — Other Ambulatory Visit: Payer: Self-pay

## 2023-11-11 DIAGNOSIS — B349 Viral infection, unspecified: Secondary | ICD-10-CM | POA: Diagnosis not present

## 2023-11-11 DIAGNOSIS — J209 Acute bronchitis, unspecified: Secondary | ICD-10-CM | POA: Diagnosis not present

## 2023-11-11 DIAGNOSIS — Z20822 Contact with and (suspected) exposure to covid-19: Secondary | ICD-10-CM | POA: Diagnosis not present

## 2023-11-12 ENCOUNTER — Ambulatory Visit: Payer: Self-pay | Admitting: Internal Medicine

## 2023-11-12 NOTE — Telephone Encounter (Signed)
Copied from CRM [id number]. Topic: Clinical - Red Word Triage >> Nov 12, 2023  3:09 PM Antony Haste wrote: Red Word that prompted transfer to Nurse Triage: Increased cough that is dry and causing chest pain. Increasingly worse than yesterday.   Chief Complaint: Dry cough Symptoms: dry cough, nasal drainage,  Frequency: Around December 4th or 5th Pertinent Negatives: Patient denies coughing up blood, fever, nausea, vomiting, diarrhea Disposition: [] ED /[] Urgent Care (no appt availability in office) / [x] Appointment(In office/virtual)/ []  [facility]/ [] Home Care/ [] Refused Recommended Disposition /[] [facility]/ []  Follow-up with PCP Additional Notes: Patient called and advised that she has been sick since December 4th or 5th, has communicated with her PCP through messenger, came in yesterday for a covid test and hadn't gotten the results yet.  Patient states that she has been coughing continuously and it's a dry cough.  Appointment made for tomorrow 11/13/2023 at 10:20 am with Dr Durwin Nora at the office. Patient was wondering if she could possibly have something for her cough sent in to the pharmacy today or if her PCP has another recommendation for the patient until the appointment tomorrow. Patient states she has tried Mucinex and she has the humidifier on but still can't seem to get over this cough. She denies any nausea, vomiting, fevers, coughing up blood, and states that the only time she has any pain is chest soreness just when coughing. Patient is advised that if she gets worse to go to the emergency room.  Patient verbalized understanding.  She states that she just can't stop coughing.   Reason for Disposition  [1] Continuous (nonstop) coughing interferes with work or school AND [2] no improvement using cough treatment per Care Advice  Answer Assessment - Initial Assessment Questions 1. ONSET: "When did the cough begin?"      weeks 2. SEVERITY: "How bad is the cough  today?"      Very bad and continuous 3. SPUTUM: "Describe the color of your sputum" (none, dry cough; clear, white, yellow, green)     N/a 4. HEMOPTYSIS: "Are you coughing up any blood?" If so ask: "How much?" (flecks, streaks, tablespoons, etc.)     no 5. DIFFICULTY BREATHING: "Are you having difficulty breathing?" If Yes, ask: "How bad is it?" (e.g., mild, moderate, severe)    - MILD: No SOB at rest, mild SOB with walking, speaks normally in sentences, can lie down, no retractions, pulse < 100.    - MODERATE: SOB at rest, SOB with minimal exertion and prefers to sit, cannot lie down flat, speaks in phrases, mild retractions, audible wheezing, pulse 100-120.    - SEVERE: Very SOB at rest, speaks in single words, struggling to breathe, sitting hunched forward, retractions, pulse > 120      Just with coughing fits 6. FEVER: "Do you have a fever?" If Yes, ask: "What is your temperature, how was it measured, and when did it start?"     no 7. CARDIAC HISTORY: "Do you have any history of heart disease?" (e.g., heart attack, congestive heart failure)      no 8. LUNG HISTORY: "Do you have any history of lung disease?"  (e.g., pulmonary embolus, asthma, emphysema)     IPF 9. PE RISK FACTORS: "Do you have a history of blood clots?" (or: recent major surgery, recent prolonged travel, bedridden)     no 10. OTHER SYMPTOMS: "Do you have any other symptoms?" (e.g., runny nose, wheezing, chest pain)       drainage  12. TRAVEL: "Have you traveled out of the country in the last month?" (e.g., travel history, exposures)       N/a  Protocols used: Cough - Acute Non-Productive-A-AH

## 2023-11-13 ENCOUNTER — Encounter: Payer: Self-pay | Admitting: Internal Medicine

## 2023-11-13 ENCOUNTER — Ambulatory Visit (INDEPENDENT_AMBULATORY_CARE_PROVIDER_SITE_OTHER): Payer: Self-pay | Admitting: Internal Medicine

## 2023-11-13 DIAGNOSIS — J84112 Idiopathic pulmonary fibrosis: Secondary | ICD-10-CM | POA: Diagnosis not present

## 2023-11-13 DIAGNOSIS — J432 Centrilobular emphysema: Secondary | ICD-10-CM | POA: Diagnosis not present

## 2023-11-13 DIAGNOSIS — R509 Fever, unspecified: Secondary | ICD-10-CM | POA: Diagnosis not present

## 2023-11-13 DIAGNOSIS — J939 Pneumothorax, unspecified: Secondary | ICD-10-CM | POA: Diagnosis not present

## 2023-11-13 DIAGNOSIS — A419 Sepsis, unspecified organism: Secondary | ICD-10-CM | POA: Diagnosis not present

## 2023-11-13 DIAGNOSIS — E860 Dehydration: Secondary | ICD-10-CM | POA: Diagnosis not present

## 2023-11-13 DIAGNOSIS — E119 Type 2 diabetes mellitus without complications: Secondary | ICD-10-CM | POA: Diagnosis not present

## 2023-11-13 DIAGNOSIS — J209 Acute bronchitis, unspecified: Secondary | ICD-10-CM | POA: Diagnosis not present

## 2023-11-13 DIAGNOSIS — Z885 Allergy status to narcotic agent status: Secondary | ICD-10-CM | POA: Diagnosis not present

## 2023-11-13 DIAGNOSIS — R0989 Other specified symptoms and signs involving the circulatory and respiratory systems: Secondary | ICD-10-CM | POA: Diagnosis not present

## 2023-11-13 DIAGNOSIS — J45901 Unspecified asthma with (acute) exacerbation: Secondary | ICD-10-CM | POA: Diagnosis not present

## 2023-11-13 DIAGNOSIS — R0981 Nasal congestion: Secondary | ICD-10-CM | POA: Diagnosis not present

## 2023-11-13 DIAGNOSIS — E7800 Pure hypercholesterolemia, unspecified: Secondary | ICD-10-CM | POA: Diagnosis not present

## 2023-11-13 DIAGNOSIS — R059 Cough, unspecified: Secondary | ICD-10-CM | POA: Diagnosis not present

## 2023-11-13 DIAGNOSIS — B349 Viral infection, unspecified: Secondary | ICD-10-CM | POA: Diagnosis not present

## 2023-11-13 DIAGNOSIS — I10 Essential (primary) hypertension: Secondary | ICD-10-CM | POA: Diagnosis not present

## 2023-11-13 DIAGNOSIS — R0902 Hypoxemia: Secondary | ICD-10-CM | POA: Diagnosis not present

## 2023-11-13 LAB — COVID-19, FLU A+B AND RSV
Influenza A, NAA: NOT DETECTED
Influenza B, NAA: NOT DETECTED
RSV, NAA: NOT DETECTED
SARS-CoV-2, NAA: NOT DETECTED

## 2023-11-13 MED ORDER — AZITHROMYCIN 250 MG PO TABS: ORAL_TABLET | ORAL | 0 refills | Status: DC

## 2023-11-13 MED ORDER — METHYLPREDNISOLONE 4 MG PO TBPK
ORAL_TABLET | ORAL | 0 refills | Status: DC
Start: 2023-11-13 — End: 2023-12-15

## 2023-11-13 MED ORDER — METHYLPREDNISOLONE ACETATE 40 MG/ML IJ SUSP
40.0000 mg | Freq: Once | INTRAMUSCULAR | Status: AC
  Administered 2023-11-13: 40 mg via INTRAMUSCULAR

## 2023-11-13 NOTE — Progress Notes (Signed)
Acute Office Visit  Subjective:     Patient ID: [name], female    DOB: 07/24/1951, 73 y.o.   MRN: [id number]  Chief Complaint  Patient presents with   URI    Dry cough , drainage , sneezing, wheezing. Patient negative for covid, flu and rsv    [name] presents today for an acute visit endorsing a persistent, dry cough.  She experienced sinus congestion earlier in the week and completed COVID/flu testing that was negative.  Current symptoms are similar to what she experienced in December in early January of this year.  Symptoms ultimately improved with steroids and Z-Pak.  She has a known history of ILD consistent with IPF.  Documented history of pulmonary emphysema as well.  Not currently on scheduled bronchodilators.  Denies fever/chills and sputum production.  Review of Systems  Constitutional:  Negative for chills, fever and malaise/fatigue.  HENT:  Positive for ear pain (Bilateral ear discomfort). Negative for congestion and sore throat.   Respiratory:  Positive for cough and shortness of breath. Negative for sputum production.       Objective:    BP (!) 152/62 (BP Location: Left Arm, Patient Position: Sitting, Cuff Size: Normal)   Pulse 100   Ht 5\' 6"  (1.676 m)   Wt 152 lb (68.9 kg)   SpO2 93%   BMI 24.53 kg/m   Physical Exam Vitals reviewed.  Constitutional:      General: She is not in acute distress.    Appearance: Normal appearance. She is not toxic-appearing.  HENT:     Head: Normocephalic and atraumatic.     Right Ear: External ear normal.     Left Ear: External ear normal.     Nose: Nose normal. No congestion or rhinorrhea.     Mouth/Throat:     Mouth: Mucous membranes are moist.     Pharynx: Oropharynx is clear. No oropharyngeal exudate or posterior oropharyngeal erythema.  Eyes:     General: No scleral icterus.    Extraocular Movements: Extraocular movements intact.     Conjunctiva/sclera: Conjunctivae normal.     Pupils: Pupils are equal,  round, and reactive to light.  Cardiovascular:     Rate and Rhythm: Normal rate and regular rhythm.     Pulses: Normal pulses.     Heart sounds: Normal heart sounds. No murmur heard.    No friction rub. No gallop.  Pulmonary:     Effort: Pulmonary effort is normal.     Breath sounds: Rhonchi (bilateral) present. No wheezing or rales.     Comments: Cough with deep inspiration Abdominal:     General: Abdomen is flat. Bowel sounds are normal. There is no distension.     Palpations: Abdomen is soft.     Tenderness: There is no abdominal tenderness.  Musculoskeletal:        General: No swelling. Normal range of motion.     Cervical back: Normal range of motion.     Right lower leg: No edema.     Left lower leg: No edema.  Lymphadenopathy:     Cervical: No cervical adenopathy.  Skin:    General: Skin is warm and dry.     Capillary Refill: Capillary refill takes less than 2 seconds.     Coloration: Skin is not jaundiced.  Neurological:     General: No focal deficit present.     Mental Status: She is alert and oriented to person, place, and time.  Psychiatric:  Mood and Affect: Mood normal.        Behavior: Behavior normal.        Assessment & Plan:   Problem List Items Addressed This Visit       Pulmonary emphysema (HCC)   Presenting today for an acute visit endorsing a 1 week history of persistent cough.  Current symptoms are similar to what she experienced earlier this year and in late 2024.  Ultimately treated with steroids and a Z-Pak.  Bilateral rhonchi noted on exam today.  She coughs with deep inspiration.  No significant sinus/nasal congestion appreciated.  Suspect current symptoms are related to known pulmonary disease, emphysema + IPF. Will retreat with Z-Pak, methylprednisolone 40 mg IM injection today, and start Medrol Dosepak tomorrow (2/14).  She will return to care if symptoms worsen or fail to improve.  Otherwise, she is scheduled for routine follow-up with Dr.  Allena Allen in June.      Meds ordered this encounter  Medications   azithromycin (ZITHROMAX Z-PAK) 250 MG tablet    Sig: Take 2 tablets (500 mg) PO today, then 1 tablet (250 mg) PO daily x4 days.    Dispense:  6 tablet    Refill:  0   methylPREDNISolone (MEDROL DOSEPAK) 4 MG TBPK tablet    Sig: Take as package instructions.    Dispense:  1 each    Refill:  0   methylPREDNISolone acetate (DEPO-MEDROL) injection 40 mg    Return if symptoms worsen or fail to improve.  [name], MD

## 2023-11-13 NOTE — Assessment & Plan Note (Signed)
Presenting today for an acute visit endorsing a 1 week history of persistent cough.  Current symptoms are similar to what she experienced earlier this year and in late 2024.  Ultimately treated with steroids and a Z-Pak.  Bilateral rhonchi noted on exam today.  She coughs with deep inspiration.  No significant sinus/nasal congestion appreciated.  Suspect current symptoms are related to known pulmonary disease, emphysema + IPF. Will retreat with Z-Pak, methylprednisolone 40 mg IM injection today, and start Medrol Dosepak tomorrow (2/14).  She will return to care if symptoms worsen or fail to improve.  Otherwise, she is scheduled for routine follow-up with Dr. [name] in June.

## 2023-11-13 NOTE — Patient Instructions (Signed)
It was a pleasure to see you today.  Thank you for giving [place] the opportunity to be involved in your care.  Below is a brief recap of your visit and next steps.  We will plan to see you again in June.  Summary Z pak and medrol dosepak prescribed today Will administer steroid injection Return to care next week if not improving

## 2023-11-25 ENCOUNTER — Encounter (HOSPITAL_BASED_OUTPATIENT_CLINIC_OR_DEPARTMENT_OTHER): Payer: Self-pay | Admitting: Pulmonary Disease

## 2023-11-25 ENCOUNTER — Encounter: Payer: Self-pay | Admitting: Internal Medicine

## 2023-11-25 NOTE — Telephone Encounter (Signed)
 Can you help get this R/S

## 2023-12-03 ENCOUNTER — Other Ambulatory Visit: Payer: Self-pay | Admitting: Nurse Practitioner

## 2023-12-08 DIAGNOSIS — E119 Type 2 diabetes mellitus without complications: Secondary | ICD-10-CM | POA: Diagnosis not present

## 2023-12-08 DIAGNOSIS — E039 Hypothyroidism, unspecified: Secondary | ICD-10-CM | POA: Diagnosis not present

## 2023-12-09 LAB — COMPREHENSIVE METABOLIC PANEL
ALT: 24 IU/L (ref 0–32)
AST: 20 IU/L (ref 0–40)
Albumin: 4.7 g/dL (ref 3.8–4.8)
Alkaline Phosphatase: 73 IU/L (ref 44–121)
BUN/Creatinine Ratio: 18 (ref 12–28)
BUN: 17 mg/dL (ref 8–27)
Bilirubin Total: 0.4 mg/dL (ref 0.0–1.2)
CO2: 18 mmol/L — ABNORMAL LOW (ref 20–29)
Calcium: 9.4 mg/dL (ref 8.7–10.3)
Chloride: 107 mmol/L — ABNORMAL HIGH (ref 96–106)
Creatinine, Ser: 0.92 mg/dL (ref 0.57–1.00)
Globulin, Total: 1.9 g/dL (ref 1.5–4.5)
Glucose: 143 mg/dL — ABNORMAL HIGH (ref 70–99)
Potassium: 5 mmol/L (ref 3.5–5.2)
Sodium: 147 mmol/L — ABNORMAL HIGH (ref 134–144)
Total Protein: 6.6 g/dL (ref 6.0–8.5)
eGFR: 65 mL/min/{1.73_m2} (ref >59)

## 2023-12-09 LAB — T4, FREE: Free T4: 1.18 ng/dL (ref 0.82–1.77)

## 2023-12-09 LAB — T3, FREE: T3, Free: 4.7 pg/mL — ABNORMAL HIGH (ref 2.0–4.4)

## 2023-12-09 LAB — TSH: TSH: 1.1 u[IU]/mL (ref 0.450–4.500)

## 2023-12-15 ENCOUNTER — Encounter: Payer: Self-pay | Admitting: Nurse Practitioner

## 2023-12-15 ENCOUNTER — Ambulatory Visit (INDEPENDENT_AMBULATORY_CARE_PROVIDER_SITE_OTHER): Payer: Medicare Other | Admitting: Nurse Practitioner

## 2023-12-15 VITALS — BP 122/64 | HR 72 | Ht 66.0 in | Wt 149.0 lb

## 2023-12-15 DIAGNOSIS — Z7984 Long term (current) use of oral hypoglycemic drugs: Secondary | ICD-10-CM | POA: Diagnosis not present

## 2023-12-15 DIAGNOSIS — E119 Type 2 diabetes mellitus without complications: Secondary | ICD-10-CM | POA: Diagnosis not present

## 2023-12-15 DIAGNOSIS — E039 Hypothyroidism, unspecified: Secondary | ICD-10-CM

## 2023-12-15 DIAGNOSIS — E559 Vitamin D deficiency, unspecified: Secondary | ICD-10-CM

## 2023-12-15 LAB — POCT GLYCOSYLATED HEMOGLOBIN (HGB A1C): Hemoglobin A1C: 7.1 % — AB (ref 4.0–5.6)

## 2023-12-15 MED ORDER — GLIPIZIDE ER 2.5 MG PO TB24: 2.5000 mg | ORAL_TABLET | Freq: Every day | ORAL | 3 refills | Status: DC

## 2023-12-15 MED ORDER — ACCU-CHEK SOFTCLIX LANCETS MISC: 2 refills | Status: DC

## 2023-12-15 MED ORDER — ACCU-CHEK GUIDE TEST VI STRP
ORAL_STRIP | 11 refills | Status: AC
Start: 2023-12-15 — End: ?

## 2023-12-15 MED ORDER — INSULIN GLARGINE 100 UNITS/ML SOLOSTAR PEN: 30.0000 [IU] | PEN_INJECTOR | Freq: Every day | SUBCUTANEOUS | 3 refills | Status: DC

## 2023-12-15 NOTE — Patient Instructions (Signed)

## 2023-12-15 NOTE — Progress Notes (Signed)
 Endocrinology Follow Up Note                                         12/15/2023, 9:59 AM  Subjective:   Subjective    [name] is a 73 y.o.-year-old female patient being seen in follow up after being seen in consultation for hypothyroidism, diabetes, hx parathyroid adenoma referred by Anabel Halon, MD.   Past Medical History:  Diagnosis Date   Arthritis    Asthma    takes Singulair nightly   Diabetes mellitus    takes levemir daily and Metformin daily   Generalized headaches    GERD (gastroesophageal reflux disease)    takes Omeprazole daily   Herniated disc, cervical    History of kidney stones    Hyperlipidemia    takes Welchol daily   Hypertension    takes Enalapril dialy   Hypothyroidism    takes Synthroid daily   IBS (irritable bowel syndrome)    IPF (idiopathic pulmonary fibrosis) (HCC) 2024   Joint pain    hands and neck   Nocturia    PONV (postoperative nausea and vomiting)    TIA (transient ischemic attack)    diagnosed by EEG but never knew she had it   Trigger finger    Weakness    Weakness    in both hands    Past Surgical History:  Procedure Laterality Date   ABDOMINAL HYSTERECTOMY  1980   ANTERIOR CERVICAL DECOMP/DISCECTOMY FUSION N/A 10/18/2013   Procedure: Cervical three-four, Cervical four-five, Cervical seven-Thoracic one anterior cervical decompression with fusion plating and bonegraft;  Surgeon: Hewitt Shorts, MD;  Location: [facility];  Service: Neurosurgery;  Laterality: N/A;  Cervical three-four, Cervical four-five, Cervical seven-Thoracic one anterior cervical decompression with fusion plating and bonegraft   APPENDECTOMY     done with hysterectomy   BACK SURGERY  2007   lumbar fusion   BIOPSY  03/19/2023   Procedure: BIOPSY;  Surgeon: Marguerita Merles, Reuel Boom, MD;  Location: [facility];  Service: Gastroenterology;;   carpel tunnel Bilateral    left  first then right   cataract implants     CHOLECYSTECTOMY N/A 11/22/2021   Procedure: LAPAROSCOPIC CHOLECYSTECTOMY;  Surgeon: Lewie Chamber, DO;  Location: [facility];  Service: General;  Laterality: N/A;   COLONOSCOPY     COLONOSCOPY WITH PROPOFOL N/A 11/27/2022   Procedure: COLONOSCOPY WITH PROPOFOL;  Surgeon: Dolores Frame, MD;  Location: [facility];  Service: Gastroenterology;  Laterality: N/A;  10:00am, asa 1-2   CYSTOSCOPY WITH RETROGRADE PYELOGRAM, URETEROSCOPY AND STENT PLACEMENT Left 04/08/2022   Procedure: CYSTOSCOPY WITH RETROGRADE PYELOGRAM, URETEROSCOPY AND STENT PLACEMENT;  Surgeon: Marcine Matar, MD;  Location: [facility];  Service: Urology;  Laterality: Left;   ESOPHAGOGASTRODUODENOSCOPY     hx of h pylori   ESOPHAGOGASTRODUODENOSCOPY (EGD) WITH PROPOFOL N/A 03/19/2023   Procedure: ESOPHAGOGASTRODUODENOSCOPY (EGD) WITH PROPOFOL;  Surgeon: Dolores Frame, MD;  Location: [facility];  Service: Gastroenterology;  Laterality: N/A;  10:30am;asa 2   EYE SURGERY  11/18/2008   right - cataracts   EYE SURGERY  06/01/2009   left - cataracts   herniated disc   10/2000, 08/31/2008   C5-C6 then C6-C7   HOLMIUM LASER APPLICATION Left 04/08/2022   Procedure: HOLMIUM LASER APPLICATION;  Surgeon: Marcine Matar, MD;  Location: [facility];  Service: Urology;  Laterality: Left;   IR URETERAL STENT RIGHT NEW ACCESS W/O SEP NEPHROSTOMY CATH  07/26/2021   knee anthroscopy bilateral Bilateral    KNEE ARTHROSCOPY     LITHOTRIPSY  11/12/2007   right side   NEPHROLITHOTOMY Right 07/26/2021   Procedure: NEPHROLITHOTOMY PERCUTANEOUS;  Surgeon: Marcine Matar, MD;  Location: [facility];  Service: Urology;  Laterality: Right;   Parathyroid surgery     08/19/12  PARATHYROIDECTOMY  07/02/2012   Procedure: PARATHYROIDECTOMY;  Surgeon: Velora Heckler, MD;  Location: [facility];  Service: General;  Laterality: N/A;   TONSILLECTOMY     as a  child    Social History   Socioeconomic History   Marital status: Widowed    Spouse name: Not on file   Number of children: Not on file   Years of education: Not on file   Highest education level: GED or equivalent  Occupational History   Not on file  Tobacco Use   Smoking status: Former    Current packs/day: 0.00    Average packs/day: 1 pack/day for 40.0 years (40.0 ttl pk-yrs)    Types: Cigarettes    Start date: 07/19/1972    Quit date: 07/19/2012    Years since quitting: 11.4    Passive exposure: Past   Smokeless tobacco: Never   Tobacco comments:    quit smoking in 2012-08-19 Vaping Use   Vaping status: Never Used  Substance and Sexual Activity   Alcohol use: No    Alcohol/week: 0.0 standard drinks of alcohol   Drug use: No   Sexual activity: Not Currently    Birth control/protection: Surgical  Other Topics Concern   Not on file  Social History Narrative   Widow,husband died 2020-11-20after 35 years marriage.Retired.   Social Drivers of Corporate investment banker Strain: Low Risk  (08/29/2023)   Overall Financial Resource Strain (CARDIA)    Difficulty of Paying Living Expenses: Not hard at all  Food Insecurity: No Food Insecurity (08/29/2023)   Hunger Vital Sign    Worried About Running Out of Food in the Last Year: Never true    Ran Out of Food in the Last Year: Never true  Transportation Needs: No Transportation Needs (08/29/2023)   PRAPARE - Administrator, Civil Service (Medical): No    Lack of Transportation (Non-Medical): No  Physical Activity: Sufficiently Active (08/29/2023)   Exercise Vital Sign    Days of Exercise per Week: 2 days    Minutes of Exercise per Session: 90 min  Stress: No Stress Concern Present (08/29/2023)   [employer] of Occupational Health - Occupational Stress Questionnaire    Feeling of Stress : Only a little  Social Connections: Moderately Isolated (08/29/2023)   Social Connection and Isolation Panel  [NHANES]    Frequency of Communication with Friends and Family: Twice a week    Frequency of Social Gatherings with Friends and Family: Twice a week    Attends Religious Services: More than 4 times per year    Active Member of Clubs or Organizations: No  Attends Banker Meetings: Not on file    Marital Status: Widowed    Family History  Problem Relation Age of Onset   Cancer Mother        pancreatic    Outpatient Encounter Medications as of 12/15/2023  Medication Sig   Accu-Chek Softclix Lancets lancets Use as instructed to monitor glucose twice daily   acetaminophen (TYLENOL) 500 MG tablet Take 500 mg by mouth every 6 (six) hours as needed.   amLODipine (NORVASC) 5 MG tablet Take 1 tablet (5 mg total) by mouth daily.   blood glucose meter kit and supplies KIT Dispense based on patient and insurance preference. Use twice daily (before breakfast and before bed).   glucose blood (ACCU-CHEK GUIDE) test strip USE TO CHECK BLOOD SUGAR TWICE DAILY, ONCE BEFORE BREAKFAST AND ONCE BEFORE BEDTIME   Insulin Pen Needle (PEN NEEDLES) 31G X 6 MM MISC Use to inject insulin once daily   metoprolol tartrate (LOPRESSOR) 25 MG tablet TAKE 1 TABLET(25 MG) BY MOUTH TWICE DAILY   pantoprazole (PROTONIX) 40 MG tablet TAKE 1 TABLET(40 MG) BY MOUTH TWICE DAILY   thyroid (NP THYROID) 60 MG tablet TAKE 1 TABLET BY MOUTH EVERY DAY BEFORE BREAKFAST   Vitamin D, Ergocalciferol, (DRISDOL) 1.25 MG (50000 UNIT) CAPS capsule TAKE 1 CAPSULE BY MOUTH EVERY 7 DAYS   [DISCONTINUED] Accu-Chek Softclix Lancets lancets Use as instructed to monitor glucose twice daily.   [DISCONTINUED] glipiZIDE (GLUCOTROL XL) 2.5 MG 24 hr tablet TAKE 1 TABLET(2.5 MG) BY MOUTH DAILY WITH BREAKFAST   [DISCONTINUED] glucose blood (ACCU-CHEK GUIDE TEST) test strip Use as instructed to monitor glucose twice daily.   [DISCONTINUED] insulin glargine (LANTUS) 100 unit/mL SOPN Inject 15 Units into the skin at bedtime.   Accu-Chek  Softclix Lancets lancets Use as instructed to monitor glucose twice daily.   glipiZIDE (GLUCOTROL XL) 2.5 MG 24 hr tablet Take 1 tablet (2.5 mg total) by mouth daily with breakfast.   glucose blood (ACCU-CHEK GUIDE TEST) test strip Use as instructed to monitor glucose twice daily.   insulin glargine (LANTUS) 100 unit/mL SOPN Inject 30 Units into the skin at bedtime.   [DISCONTINUED] amoxicillin-clavulanate (AUGMENTIN) 875-125 MG tablet Take 1 tablet by mouth 2 (two) times daily. (Patient not taking: Reported on 12/15/2023)   [DISCONTINUED] azithromycin (ZITHROMAX Z-PAK) 250 MG tablet Take 2 tablets (500 mg) PO today, then 1 tablet (250 mg) PO daily x4 days.   [DISCONTINUED] Continuous Glucose Sensor (DEXCOM G7 SENSOR) MISC Inject 1 Application into the skin as directed. Change sensor every 10 days as directed. (Patient not taking: Reported on 12/15/2023)   [DISCONTINUED] methylPREDNISolone (MEDROL DOSEPAK) 4 MG TBPK tablet Take as package instructions.   [DISCONTINUED] promethazine-dextromethorphan (PROMETHAZINE-DM) 6.25-15 MG/5ML syrup Take 5 mLs by mouth 4 (four) times daily as needed.   No facility-administered encounter medications on file as of 12/15/2023.    ALLERGIES: Allergies  Allergen Reactions   Advair Diskus [Fluticasone-Salmeterol] Anaphylaxis   Codeine Nausea And Vomiting   Hydrocodone Nausea And Vomiting   Levothyroxine Other (See Comments)    Caused lower leg swelling   Oxycodone Nausea And Vomiting   Statins     Muscle and joint pain   Tetracyclines & Related Itching    Internal   VACCINATION STATUS: Immunization History  Administered Date(s) Administered   Fluad Quad(high Dose 65+) 08/17/2019, 07/29/2021   Influenza Split 07/31/2013   Influenza, High Dose Seasonal PF 07/01/2017, 07/14/2020   Influenza-Unspecified 07/09/2017, 08/09/2022   Moderna Sars-Covid-2 Vaccination 11/24/2019,  12/22/2019, 08/30/2020, 02/28/2021   Tdap 07/12/2014     Diabetes She presents  for her follow-up diabetic visit. She has type 2 diabetes mellitus. Her disease course has been improving. Associated symptoms include fatigue. There are no hypoglycemic complications. Symptoms are stable. There are no diabetic complications. Risk factors for coronary artery disease include diabetes mellitus, stress, post-menopausal and hypertension. Current diabetic treatment includes insulin injections and oral agent (dual therapy). She is compliant with treatment most of the time. Her weight is fluctuating minimally. She is following a generally healthy diet. Meal planning includes avoidance of concentrated sweets. She has not had a previous visit with a dietitian. She participates in exercise intermittently. Her home blood glucose trend is decreasing steadily. Her breakfast blood glucose range is generally 130-140 mg/dl. (She presents today with her meter and logs showing above target glycemic profile overall.  Her POCT A1c today is 7.1%, improving from last visit of 7.6%.  She notes she has been sick several times since December, was put on prednisone several times.  She did not care for CGM.  Analysis of her meter shows 7-day average of 148, 14-day average of 155, 30-day average of 149, 90-day average of 161.) An ACE inhibitor/angiotensin II receptor blocker is not being taken. She does not see a podiatrist.Eye exam is current.  Thyroid Problem Presents for follow-up visit. Symptoms include depressed mood and fatigue. Patient reports no constipation or weight gain. The symptoms have been stable.    [name] is a patient with the above medical history. she was diagnosed with parathyroid adenoma in 2013 which required parathyroidectomy done by Dr. Darnell Level.  She is now experiencing similar symptoms that she had at that time and would like to investigate whether or not there could be a problem with one of her other parathyroid glands.  She thinks she remembers being started on thyroid hormone  replacement after her parathyroid removal due to possible injury to the thyroid gland during its removal.  She was a patient of Dr. Karilyn Cota who had started the patient on NP thyroid likely as a result of accidental injury to the thyroid tissue during parathyroidectomy surgery   she was given various doses of NP thyroid over the years, currently on 60 mg. she reports compliance to this medication:  Taking it daily on empty stomach  with water, separated by >30 minutes before breakfast and other medications, and by at least 4 hours from calcium, iron, PPIs, multivitamins .  I reviewed patient's thyroid tests:  Lab Results  Component Value Date   TSH 1.100 12/08/2023   TSH 2.890 06/03/2023   TSH 2.920 10/21/2022   TSH 2.300 01/23/2022   TSH 2.120 10/22/2021   TSH 2.930 07/03/2021   TSH 0.57 10/14/2019   TSH 0.15 (L) 06/29/2019   TSH 2.39 06/23/2018   TSH 2.66 04/16/2017   FREET4 1.18 12/08/2023   FREET4 1.11 06/03/2023   FREET4 1.13 10/21/2022   FREET4 1.26 01/23/2022   FREET4 1.26 10/22/2021   FREET4 1.23 07/03/2021   FREET4 1.4 06/23/2018   FREET4 1.4 04/16/2017   FREET4 1.5 10/14/2016   FREET4 1.1 04/04/2016    Pt denies feeling nodules in neck, hoarseness,  SOB with lying down.  She does report intermittent trouble swallowing pills, something new.  she denies family history of thyroid disorders.  No family history of thyroid cancer.  No history of radiation therapy to head or neck.  No recent use of iodine supplements.  Denies use of Biotin  containing supplements.  I reviewed her chart and she also has a history of parathyroid adenoma, controlled Diabetes without medications, GERD, HTN.   Review of systems  Constitutional: + Minimally fluctuating body weight,  current Body mass index is 24.05 kg/m. , no fatigue, no subjective hyperthermia, no subjective hypothermia Eyes: no blurry vision, no xerophthalmia ENT: no sore throat, no nodules palpated in throat, no  dysphagia/odynophagia, no hoarseness Cardiovascular: no chest pain, no shortness of breath, no palpitations, no leg swelling Respiratory: no cough, no shortness of breath Gastrointestinal: no nausea/vomiting/diarrhea, Skin: no rashes, no hyperemia Neurological: + intermittent tremors, no numbness, no tingling, no dizziness Psychiatric: + depression-stable, no anxiety   Objective:   Objective     BP 122/64 (BP Location: Left Arm, Patient Position: Sitting, Cuff Size: Large)   Pulse 72   Ht 5\' 6"  (1.676 m)   Wt 149 lb (67.6 kg)   BMI 24.05 kg/m  Wt Readings from Last 3 Encounters:  12/15/23 149 lb (67.6 kg)  11/13/23 152 lb (68.9 kg)  09/10/23 151 lb (68.5 kg)    BP Readings from Last 3 Encounters:  12/15/23 122/64  11/13/23 (!) 152/62  09/10/23 138/78      Physical Exam- Limited  Constitutional:  Body mass index is 24.05 kg/m. , not in acute distress, normal state of mind Eyes:  EOMI, no exophthalmos Musculoskeletal: no gross deformities, strength intact in all four extremities, no gross restriction of joint movements Skin:  no rashes, no hyperemia Neurological: no tremor with outstretched hands   Diabetic Foot Exam - Simple   No data filed     CMP ( most recent) CMP     Component Value Date/Time   NA 147 (H) 12/08/2023 0816   K 5.0 12/08/2023 0816   CL 107 (H) 12/08/2023 0816   CO2 18 (L) 12/08/2023 0816   GLUCOSE 143 (H) 12/08/2023 0816   GLUCOSE 211 (H) 04/08/2022 0713   BUN 17 12/08/2023 0816   CREATININE 0.92 12/08/2023 0816   CREATININE 0.81 12/25/2020 1034   CALCIUM 9.4 12/08/2023 0816   PROT 6.6 12/08/2023 0816   ALBUMIN 4.7 12/08/2023 0816   AST 20 12/08/2023 0816   ALT 24 12/08/2023 0816   ALKPHOS 73 12/08/2023 0816   BILITOT 0.4 12/08/2023 0816   GFRNONAA >60 11/20/2021 0916   GFRNONAA 73 12/25/2020 1034   GFRAA 85 12/25/2020 1034     Diabetic Labs (most recent): Lab Results  Component Value Date   HGBA1C 7.1 (A) 12/15/2023    HGBA1C 7.6 (A) 09/10/2023   HGBA1C 8.5 (A) 06/10/2023   MICROALBUR 30mg /l 06/25/2022   MICROALBUR 30 07/10/2021   MICROALBUR 0.3 02/18/2018     Lipid Panel ( most recent) Lipid Panel     Component Value Date/Time   CHOL 174 10/21/2022 0837   TRIG 182 (H) 10/21/2022 0837   HDL 37 (L) 10/21/2022 0837   CHOLHDL 4.7 (H) 10/21/2022 0837   CHOLHDL 3.9 12/25/2020 1034   VLDL 33 (H) 10/14/2016 0721   LDLCALC 105 (H) 10/21/2022 0837   LDLCALC 85 12/25/2020 1034   LABVLDL 32 10/21/2022 0837       Lab Results  Component Value Date   TSH 1.100 12/08/2023   TSH 2.890 06/03/2023   TSH 2.920 10/21/2022   TSH 2.300 01/23/2022   TSH 2.120 10/22/2021   TSH 2.930 07/03/2021   TSH 0.57 10/14/2019   TSH 0.15 (L) 06/29/2019   TSH 2.39 06/23/2018   TSH 2.66 04/16/2017   FREET4  1.18 12/08/2023   FREET4 1.11 06/03/2023   FREET4 1.13 10/21/2022   FREET4 1.26 01/23/2022   FREET4 1.26 10/22/2021   FREET4 1.23 07/03/2021   FREET4 1.4 06/23/2018   FREET4 1.4 04/16/2017   FREET4 1.5 10/14/2016   FREET4 1.1 04/04/2016    US Thyroid 06/26/21 CLINICAL DATA:  History of parathyroid adenoma.   EXAM: THYROID ULTRASOUND   TECHNIQUE: Ultrasound examination of the thyroid gland and adjacent soft tissues was performed.   COMPARISON:  04/23/2011   FINDINGS: Parenchymal Echotexture: Mildly heterogenous   Isthmus: 0.1 cm, previously 0.2 cm   Right lobe: 4.6 x 1.9 x 1.4 cm, previously 4.1 x 1.4 x 1.4 cm   Left lobe: 3.1 x 1.0 x 1.1 cm, previously 3.9 x 1.5 x 1.3 cm   _________________________________________________________   Estimated total number of nodules >/= 1 cm: 0   Number of spongiform nodules >/=  2 cm not described below (TR1): 0   Number of mixed cystic and solid nodules >/= 1.5 cm not described below (TR2): 0   _________________________________________________________   No discrete nodules. Previously there was a nodule in the left thyroid lobe that is no longer  present.   IMPRESSION: Thyroid tissue is mildly heterogeneous without discrete thyroid nodules.     Electronically Signed   By: Richarda Overlie M.D.   On: 06/26/2021 11:17    Latest Reference Range & Units 07/03/21 09:18 10/22/21 08:55 01/23/22 09:05 10/21/22 08:37 06/03/23 08:27 12/08/23 08:16  TSH 0.450 - 4.500 uIU/mL 2.930 2.120 2.300 2.920 2.890 1.100  Triiodothyronine,Free,Serum 2.0 - 4.4 pg/mL 4.4     4.7 (H)  T4,Free(Direct) 0.82 - 1.77 ng/dL 1.61 0.96 0.45 4.09 8.11 1.18  Thyroperoxidase Ab SerPl-aCnc 0 - 34 IU/mL <8       Thyroglobulin Antibody 0.0 - 0.9 IU/mL <1.0       (H): Data is abnormally high    Assessment & Plan:   ASSESSMENT / PLAN:  1. History of parathyroid adenoma- s/p parathyroidectomy  -Her recent calcium levels were normal at 9.1.  She does have low vitamin D currently on replacement.  2. Hypothyroidism- unspecified  Patient with long-standing hypothyroidism, on NP thyroid 60 mg.  She historically does not tolerate Levothyroxine (causes significant ankle swelling).  -Her previsit TFTs are consistent with appropriate hormone replacement (FT3 slightly elevated but denies any symptoms of over-replacement).  She is advised to continue NP thyroid 60 mg po daily before breakfast.  Will recheck TFTs prior to next visit.  - We discussed about correct intake of levothyroxine, at fasting, with water, separated by at least 30 minutes from breakfast, and separated by more than 4 hours from calcium, iron, multivitamins, acid reflux medications (PPIs). -Patient is made aware of the fact that thyroid hormone replacement is needed for life, dose to be adjusted by periodic monitoring of thyroid function tests.  3. Uncontrolled Diabetes  She presents today with her meter and logs showing above target glycemic profile overall.  Her POCT A1c today is 7.1%, improving from last visit of 7.6%.  She notes she has been sick several times since December, was put on prednisone several  times.  She did not care for CGM.  Analysis of her meter shows 7-day average of 148, 14-day average of 155, 30-day average of 149, 90-day average of 161.  She is advised to continue Lantus 30 units SQ nightly and Glipizide 2.5 mg XL daily with breakfast.    Will keep her off Metformin as kidney function has improved  since its discontinuation.  Printed off copy of labs for her to take to nephrology appt coming up.  She is advised to check glucose 1-2 times daily, before breakfast and before bed, and to call the clinic if she has readings less than 70 or above 300 for 3 tests in a row.  She did not care for the CGM, prefers to do traditional fingersticks.  - Nutritional counseling repeated at each appointment due to patients tendency to fall back in to old habits.  - The patient admits there is a room for improvement in their diet and drink choices. -  Suggestion is made for the patient to avoid simple carbohydrates from their diet including Cakes, Sweet Desserts / Pastries, Ice Cream, Soda (diet and regular), Sweet Tea, Candies, Chips, Cookies, Sweet Pastries, Store Bought Juices, Alcohol in Excess of 1-2 drinks a day, Artificial Sweeteners, Coffee Creamer, and "Sugar-free" Products. This will help patient to have stable blood glucose profile and potentially avoid unintended weight gain.   - I encouraged the patient to switch to unprocessed or minimally processed complex starch and increased protein intake (animal or plant source), fruits, and vegetables.   - Patient is advised to stick to a routine mealtimes to eat 3 meals a day and avoid unnecessary snacks (to snack only to correct hypoglycemia).  4. Vitamin D deficiency Her most recent vitamin D level was 51.2 on 10/21/22.  She reportedly does not tolerate OTC vitamin D, causes upset stomach and diarrhea.  I refilled her Ergocalciferol 50000 units PO weekly which she is tolerating well.        I spent  30  minutes in the care of the patient  today including review of labs from CMP, Lipids, Thyroid Function, Hematology (current and previous including abstractions from other facilities); face-to-face time discussing  her blood glucose readings/logs, discussing hypoglycemia and hyperglycemia episodes and symptoms, medications doses, her options of short and long term treatment based on the latest standards of care / guidelines;  discussion about incorporating lifestyle medicine;  and documenting the encounter. Risk reduction counseling performed per USPSTF guidelines to reduce obesity and cardiovascular risk factors.     Please refer to Patient Instructions for Blood Glucose Monitoring and Insulin/Medications Dosing Guide"  in media tab for additional information. Please  also refer to " Patient Self Inventory" in the Media  tab for reviewed elements of pertinent patient history.  [name] participated in the discussions, expressed understanding, and voiced agreement with the above plans.  All questions were answered to her satisfaction. she is encouraged to contact clinic should she have any questions or concerns prior to her return visit.    FOLLOW UP PLAN:  Return in about 3 months (around 03/16/2024) for Diabetes F/U with A1c in office, Thyroid follow up, Previsit labs, Bring meter and logs.   Ronny Bacon, [facility] [address] [place], [place] [address] Phone: [phone] Fax: [phone]  12/15/2023, 9:59 AM

## 2023-12-15 NOTE — Progress Notes (Signed)
 History of Present Illness: [name] is a 73 y.o. year old female previously seen-at [facility] for urolithiasis.  She does have a history of hyperparathyroidism, and has had a parathyroid adenectomy by Dr. Gerrit Friends.  Interventions- 10.27.2022: Right percutaneous nephrolithotomy of a 21 mm stone 7.23.2023: Left ureteroscopy with holmium laser of ureteral and renal calculi  24-hour urine studies: Volume-1.4L Oxalate-28 Sodium-154 Uric acid-587 Calcium--164 Stone composition--calcium oxalate monohydrate/calcium oxalate dihydrate-80/20%  Most recent imaging was CT angio of chest abdomen and pelvis in June, 2024.  This revealed a 4 mm left lower pole stone, other punctate stones in the lower pole of the left kidney.  3.18.2025: She has had no urinary issues recently.  She has had no flank pain no blood in urine.  Past Medical History:  Diagnosis Date   Arthritis    Asthma    takes Singulair nightly   Diabetes mellitus    takes levemir daily and Metformin daily   Generalized headaches    GERD (gastroesophageal reflux disease)    takes Omeprazole daily   Herniated disc, cervical    History of kidney stones    Hyperlipidemia    takes Welchol daily   Hypertension    takes Enalapril dialy   Hypothyroidism    takes Synthroid daily   IBS (irritable bowel syndrome)    IPF (idiopathic pulmonary fibrosis) (HCC) 2024   Joint pain    hands and neck   Nocturia    PONV (postoperative nausea and vomiting)    TIA (transient ischemic attack)    diagnosed by EEG but never knew she had it   Trigger finger    Weakness    Weakness    in both hands    Past Surgical History:  Procedure Laterality Date   ABDOMINAL HYSTERECTOMY  1980   ANTERIOR CERVICAL DECOMP/DISCECTOMY FUSION N/A 10/18/2013   Procedure: Cervical three-four, Cervical four-five, Cervical seven-Thoracic one anterior cervical decompression with fusion plating and bonegraft;  Surgeon: Hewitt Shorts, MD;   Location: [facility];  Service: Neurosurgery;  Laterality: N/A;  Cervical three-four, Cervical four-five, Cervical seven-Thoracic one anterior cervical decompression with fusion plating and bonegraft   APPENDECTOMY     done with hysterectomy   BACK SURGERY  2007   lumbar fusion   BIOPSY  03/19/2023   Procedure: BIOPSY;  Surgeon: Marguerita Merles, Reuel Boom, MD;  [facility];  Service: Gastroenterology;;   carpel tunnel Bilateral    left first then right   cataract implants     CHOLECYSTECTOMY N/A 11/22/2021   Procedure: LAPAROSCOPIC CHOLECYSTECTOMY;  Surgeon: Lewie Chamber, DO;  Location: [facility];  Service: General;  Laterality: N/A;   COLONOSCOPY     COLONOSCOPY WITH PROPOFOL N/A 11/27/2022   Procedure: COLONOSCOPY WITH PROPOFOL;  Surgeon: Dolores Frame, MD;  [facility];  Service: Gastroenterology;  Laterality: N/A;  10:00am, asa 1-2   CYSTOSCOPY WITH RETROGRADE PYELOGRAM, URETEROSCOPY AND STENT PLACEMENT Left 04/08/2022   Procedure: CYSTOSCOPY WITH RETROGRADE PYELOGRAM, URETEROSCOPY AND STENT PLACEMENT;  Surgeon: Marcine Matar, MD;  Location: [facility];  Service: Urology;  Laterality: Left;   ESOPHAGOGASTRODUODENOSCOPY     hx of h pylori   ESOPHAGOGASTRODUODENOSCOPY (EGD) WITH PROPOFOL N/A 03/19/2023   Procedure: ESOPHAGOGASTRODUODENOSCOPY (EGD) WITH PROPOFOL;  Surgeon: Dolores Frame, MD;  [facility];  Service: Gastroenterology;  Laterality: N/A;  10:30am;asa 2   EYE SURGERY  11/18/2008   right - cataracts   EYE SURGERY  06/01/2009  left - cataracts   herniated disc   10/2000, 08/31/2008   C5-C6 then C6-C7   HOLMIUM LASER APPLICATION Left 04/08/2022   Procedure: HOLMIUM LASER APPLICATION;  Surgeon: Marcine Matar, MD;  Location: [facility];  Service: Urology;  Laterality: Left;   IR URETERAL STENT RIGHT NEW ACCESS W/O SEP NEPHROSTOMY CATH  07/26/2021   knee anthroscopy bilateral  Bilateral    KNEE ARTHROSCOPY     LITHOTRIPSY  11/12/2007   right side   NEPHROLITHOTOMY Right 07/26/2021   Procedure: NEPHROLITHOTOMY PERCUTANEOUS;  Surgeon: Marcine Matar, MD;  Location: [facility];  Service: Urology;  Laterality: Right;   Parathyroid surgery     October 2013   PARATHYROIDECTOMY  07/02/2012   Procedure: PARATHYROIDECTOMY;  Surgeon: Velora Heckler, MD;  Location: [facility];  Service: General;  Laterality: N/A;   TONSILLECTOMY     as a child    Home Medications:  (Not in a hospital admission)   Allergies:  Allergies  Allergen Reactions   Advair Diskus [Fluticasone-Salmeterol] Anaphylaxis   Codeine Nausea And Vomiting   Hydrocodone Nausea And Vomiting   Levothyroxine Other (See Comments)    Caused lower leg swelling   Oxycodone Nausea And Vomiting   Statins     Muscle and joint pain   Tetracyclines & Related Itching    Internal    Family History  Problem Relation Age of Onset   Cancer Mother        pancreatic    Social History:  reports that she quit smoking about 11 years ago. Her smoking use included cigarettes. She started smoking about 51 years ago. She has a 40 pack-year smoking history. She has been exposed to tobacco smoke. She has never used smokeless tobacco. She reports that she does not drink alcohol and does not use drugs.  ROS: A complete review of systems was performed.  All systems are negative except for pertinent findings as noted.  Physical Exam:  Vital signs in last 24 hours: @VSRANGES @ General:  Alert and oriented, No acute distress HEENT: Normocephalic, atraumatic Neck: No JVD or lymphadenopathy Cardiovascular: Regular rate  Lungs: Normal inspiratory/expiratory excursion Extremities: No edema Neurologic: Grossly intact  I have reviewed notes from [facility]  I have reviewed urinalysis results  24-hour urine results were reviewed  I have independently reviewed prior imaging--images from CT angiogram from July, 2024 reviewed with the  patient  I have reviewed recent laboratories--GFR 64  Impression/Assessment:  1.  History of urolithiasis with tiny stones bilaterally, no recent imaging  2.  Overactive bladder symptoms, not currently bothersome  Plan:  1.  I will send her for KUB today  2.  Dietary modification discussed  3.  I will see her back in a year  Chelsea Aus 12/15/2023, 10:16 AM  Bertram Millard. Albertus Chiarelli MD

## 2023-12-16 ENCOUNTER — Ambulatory Visit (HOSPITAL_COMMUNITY)
Admission: RE | Admit: 2023-12-16 | Discharge: 2023-12-16 | Disposition: A | Discharge status: Home / Self Care | Source: Ambulatory Visit | Attending: Urology | Admitting: Urology

## 2023-12-16 ENCOUNTER — Ambulatory Visit (INDEPENDENT_AMBULATORY_CARE_PROVIDER_SITE_OTHER): Payer: Medicare Other | Admitting: Urology

## 2023-12-16 ENCOUNTER — Other Ambulatory Visit: Payer: Self-pay

## 2023-12-16 VITALS — BP 137/63 | HR 78

## 2023-12-16 DIAGNOSIS — Z87442 Personal history of urinary calculi: Secondary | ICD-10-CM | POA: Diagnosis not present

## 2023-12-16 DIAGNOSIS — N3281 Overactive bladder: Secondary | ICD-10-CM | POA: Diagnosis not present

## 2023-12-16 DIAGNOSIS — N200 Calculus of kidney: Secondary | ICD-10-CM | POA: Diagnosis not present

## 2023-12-17 LAB — URINALYSIS, ROUTINE W REFLEX MICROSCOPIC
Bilirubin, UA: NEGATIVE
Glucose, UA: NEGATIVE
Ketones, UA: NEGATIVE
Leukocytes,UA: NEGATIVE
Nitrite, UA: NEGATIVE
Protein,UA: NEGATIVE
RBC, UA: NEGATIVE
Specific Gravity, UA: 1.02 (ref 1.005–1.030)
Urobilinogen, Ur: 0.2 mg/dL (ref 0.2–1.0)
pH, UA: 6.5 (ref 5.0–7.5)

## 2023-12-19 ENCOUNTER — Encounter: Payer: Self-pay | Admitting: Nurse Practitioner

## 2023-12-21 ENCOUNTER — Other Ambulatory Visit: Payer: Self-pay | Admitting: Internal Medicine

## 2023-12-21 DIAGNOSIS — I10 Essential (primary) hypertension: Secondary | ICD-10-CM

## 2023-12-22 NOTE — Telephone Encounter (Signed)
 Look out for something from [facility] about her meter prescription.

## 2023-12-29 NOTE — Telephone Encounter (Signed)
 Can you call her pharmacy and see why she cannot get her strips or lancets for her glucose meter?

## 2023-12-30 ENCOUNTER — Encounter: Payer: Self-pay | Admitting: Urology

## 2024-01-01 ENCOUNTER — Encounter (INDEPENDENT_AMBULATORY_CARE_PROVIDER_SITE_OTHER): Payer: Self-pay | Admitting: Gastroenterology

## 2024-01-01 ENCOUNTER — Ambulatory Visit (INDEPENDENT_AMBULATORY_CARE_PROVIDER_SITE_OTHER): Payer: Medicare Other | Admitting: Gastroenterology

## 2024-01-01 ENCOUNTER — Ambulatory Visit (HOSPITAL_COMMUNITY)
Admission: RE | Admit: 2024-01-01 | Discharge: 2024-01-01 | Disposition: A | Discharge status: Home / Self Care | Payer: Medicare Other | Source: Ambulatory Visit | Attending: Nurse Practitioner | Admitting: Nurse Practitioner

## 2024-01-01 VITALS — BP 124/55 | HR 106 | Temp 97.6°F | Ht 66.0 in | Wt 150.6 lb

## 2024-01-01 DIAGNOSIS — R143 Flatulence: Secondary | ICD-10-CM

## 2024-01-01 DIAGNOSIS — K219 Gastro-esophageal reflux disease without esophagitis: Secondary | ICD-10-CM

## 2024-01-01 DIAGNOSIS — J849 Interstitial pulmonary disease, unspecified: Secondary | ICD-10-CM | POA: Diagnosis not present

## 2024-01-01 LAB — PULMONARY FUNCTION TEST
DL/VA % pred: 90 %
DL/VA: 3.69 ml/min/mmHg/L
DLCO unc % pred: 76 %
DLCO unc: 15.45 ml/min/mmHg
FEF 25-75 Post: 1.69 L/s
FEF 25-75 Pre: 1.73 L/s
FEF2575-%Change-Post: -2 %
FEF2575-%Pred-Post: 92 %
FEF2575-%Pred-Pre: 94 %
FEV1-%Change-Post: 0 %
FEV1-%Pred-Post: 93 %
FEV1-%Pred-Pre: 93 %
FEV1-Post: 2.17 L
FEV1-Pre: 2.18 L
FEV1FVC-%Change-Post: 0 %
FEV1FVC-%Pred-Pre: 100 %
FEV6-%Change-Post: -1 %
FEV6-%Pred-Post: 95 %
FEV6-%Pred-Pre: 97 %
FEV6-Post: 2.83 L
FEV6-Pre: 2.87 L
FEV6FVC-%Change-Post: 0 %
FEV6FVC-%Pred-Post: 104 %
FEV6FVC-%Pred-Pre: 104 %
FVC-%Change-Post: -1 %
FVC-%Pred-Post: 92 %
FVC-%Pred-Pre: 93 %
FVC-Post: 2.85 L
FVC-Pre: 2.89 L
Post FEV1/FVC ratio: 76 %
Post FEV6/FVC ratio: 99 %
Pre FEV1/FVC ratio: 76 %
Pre FEV6/FVC Ratio: 99 %
RV % pred: 96 %
RV: 2.29 L
TLC % pred: 96 %
TLC: 5.15 L

## 2024-01-01 MED ORDER — ALBUTEROL SULFATE (2.5 MG/3ML) 0.083% IN NEBU
2.5000 mg | INHALATION_SOLUTION | Freq: Once | RESPIRATORY_TRACT | Status: AC
  Administered 2024-01-01: 2.5 mg via RESPIRATORY_TRACT

## 2024-01-01 NOTE — Progress Notes (Addendum)
 Referring Provider: Anabel Halon, MD Primary Care Physician:  Anabel Halon, MD Primary GI Physician: Dr. Levon Hedger   Chief Complaint  Patient presents with   Elevated Hepatic Enzymes    Follow up on elevated liver enzymes. Had some labs on 3/10 with pcp.    Gastroesophageal Reflux    Follow up on GERD. Takes pantoprazole 40mg  as needed now and it does help.    HPI:   [name] is a 73 y.o. female with past medical history of  HTN, GERD, Type II DM, Hypothyroidism, HLD and parathyroid adenoma.   Patient presenting today for:  Follow up of fatty liver/Elevated LFTs and GERD/upper abdominal pain   Last seen October 2024, at that that doing better on protonix 40mg  daily. Rare heartburn/acid regurgitation. Epigastric pain improved. Dicyclomine made her dizzy.   Recommended to continue protonix 40mg  daily, consider liver elastography  Most recent labs in march with normal LFTs   Present: Doing well today. Has no abdominal pain. She had tried stopping protonix as she felt okay but then she notes she resumed it as she was having more gas (passing flatus), doing this BID to see how she did and  is planning to drop down to once daily next week. She denies any bloating or pain with gas. She denies any heartburn or acid regurgitation. Overall she is feeling good. Notes her main concern is his IPF. No red flag symptoms. Patient denies melena, hematochezia, nausea, vomiting, diarrhea, constipation, dysphagia, odyonophagia, early satiety or weight loss.    Previous liver workup: June 2024 Acute hep panel: negative IgG, IgM, IgA WNL Ceruloplasmin WNL Iron studies WNL AMA negative ASMA negative ANA negative A1A WNL   Ct angio abd pelvis w wo: 03/2023 Vascular Impression:   1. Large amount of irregular mixed calcified and noncalcified atherosclerotic plaque throughout the abdominal aorta approaching though not definitively resulting in a hemodynamically significant stenosis.  Aortic Atherosclerosis (ICD10-I70.0). 2. No CT evidence of mesenteric ischemia.   Nonvascular Impression:   1. Nonobstructing left-sided nephrolithiasis. 2. Postcholecystectomy, hysterectomy and appendectomy.   [place] abd complete: 09/2021 Bilateral renal calculi.  No hydronephrosis. 2. Echogenic liver likely related to diffuse hepatocellular disease such as fatty infiltration. 3. Cholelithiasis. No additional sonographic evidence for acute cholecystitis. Last Colonoscopy:10/2022- The entire examined colon is normal.                           - Non-bleeding internal hemorrhoids.                           - No specimens collected.   Last Endoscopy:03/2023 1cm hh, gastritis-no h pylori   Recommendations:  Repeat Colonoscopy 5 years    Filed Weights   01/01/24 1407  Weight: 150 lb 9.6 oz (68.3 kg)     Past Medical History:  Diagnosis Date   Arthritis    Asthma    takes Singulair nightly   Diabetes mellitus    takes levemir daily and Metformin daily   Generalized headaches    GERD (gastroesophageal reflux disease)    takes Omeprazole daily   Herniated disc, cervical    History of kidney stones    Hyperlipidemia    takes Welchol daily   Hypertension    takes Enalapril dialy   Hypothyroidism    takes Synthroid daily   IBS (irritable bowel syndrome)    IPF (idiopathic pulmonary fibrosis) (HCC) 2024  Joint pain    hands and neck   Nocturia    PONV (postoperative nausea and vomiting)    TIA (transient ischemic attack)    diagnosed by EEG but never knew she had it   Trigger finger    Weakness    Weakness    in both hands    Past Surgical History:  Procedure Laterality Date   ABDOMINAL HYSTERECTOMY  1980   ANTERIOR CERVICAL DECOMP/DISCECTOMY FUSION N/A 10/18/2013   Procedure: Cervical three-four, Cervical four-five, Cervical seven-Thoracic one anterior cervical decompression with fusion plating and bonegraft;  Surgeon: Hewitt Shorts, MD;  Location: [facility];   Service: Neurosurgery;  Laterality: N/A;  Cervical three-four, Cervical four-five, Cervical seven-Thoracic one anterior cervical decompression with fusion plating and bonegraft   APPENDECTOMY     done with hysterectomy   BACK SURGERY  2007   lumbar fusion   BIOPSY  03/19/2023   Procedure: BIOPSY;  Surgeon: Marguerita Merles, Reuel Boom, MD;  [facility];  Service: Gastroenterology;;   carpel tunnel Bilateral    left first then right   cataract implants     CHOLECYSTECTOMY N/A 11/22/2021   Procedure: LAPAROSCOPIC CHOLECYSTECTOMY;  Surgeon: Lewie Chamber, DO;  Location: [facility];  Service: General;  Laterality: N/A;   COLONOSCOPY     COLONOSCOPY WITH PROPOFOL N/A 11/27/2022   Procedure: COLONOSCOPY WITH PROPOFOL;  Surgeon: Dolores Frame, MD;  [facility];  Service: Gastroenterology;  Laterality: N/A;  10:00am, asa 1-2   CYSTOSCOPY WITH RETROGRADE PYELOGRAM, URETEROSCOPY AND STENT PLACEMENT Left 04/08/2022   Procedure: CYSTOSCOPY WITH RETROGRADE PYELOGRAM, URETEROSCOPY AND STENT PLACEMENT;  Surgeon: Marcine Matar, MD;  Location: [facility];  Service: Urology;  Laterality: Left;   ESOPHAGOGASTRODUODENOSCOPY     hx of h pylori   ESOPHAGOGASTRODUODENOSCOPY (EGD) WITH PROPOFOL N/A 03/19/2023   Procedure: ESOPHAGOGASTRODUODENOSCOPY (EGD) WITH PROPOFOL;  Surgeon: Dolores Frame, MD;  [facility];  Service: Gastroenterology;  Laterality: N/A;  10:30am;asa 2   EYE SURGERY  11/18/2008   right - cataracts   EYE SURGERY  06/01/2009   left - cataracts   herniated disc   10/2000, 08/31/2008   C5-C6 then C6-C7   HOLMIUM LASER APPLICATION Left 04/08/2022   Procedure: HOLMIUM LASER APPLICATION;  Surgeon: Marcine Matar, MD;  Location: [facility];  Service: Urology;  Laterality: Left;   IR URETERAL STENT RIGHT NEW ACCESS W/O SEP NEPHROSTOMY CATH  07/26/2021   knee anthroscopy bilateral Bilateral    KNEE  ARTHROSCOPY     LITHOTRIPSY  11/12/2007   right side   NEPHROLITHOTOMY Right 07/26/2021   Procedure: NEPHROLITHOTOMY PERCUTANEOUS;  Surgeon: Marcine Matar, MD;  Location: [facility];  Service: Urology;  Laterality: Right;   Parathyroid surgery     October 2013   PARATHYROIDECTOMY  07/02/2012   Procedure: PARATHYROIDECTOMY;  Surgeon: Velora Heckler, MD;  Location: [facility];  Service: General;  Laterality: N/A;   TONSILLECTOMY     as a child    Current Outpatient Medications  Medication Sig Dispense Refill   Accu-Chek Softclix Lancets lancets Use as instructed to monitor glucose twice daily 200 each 11   Accu-Chek Softclix Lancets lancets Use as instructed to monitor glucose twice daily. 100 each 2   acetaminophen (TYLENOL) 500 MG tablet Take 500 mg by mouth every 6 (six) hours as needed.     amLODipine (NORVASC) 5 MG tablet TAKE 1 TABLET(5 MG) BY MOUTH DAILY 90 tablet 1  blood glucose meter kit and supplies KIT Dispense based on patient and insurance preference. Use twice daily (before breakfast and before bed). 1 each 0   glipiZIDE (GLUCOTROL XL) 2.5 MG 24 hr tablet Take 1 tablet (2.5 mg total) by mouth daily with breakfast. 90 tablet 3   glucose blood (ACCU-CHEK GUIDE TEST) test strip Use as instructed to monitor glucose twice daily. 200 each 11   glucose blood (ACCU-CHEK GUIDE) test strip USE TO CHECK BLOOD SUGAR TWICE DAILY, ONCE BEFORE BREAKFAST AND ONCE BEFORE BEDTIME 200 strip 1   insulin glargine (LANTUS) 100 unit/mL SOPN Inject 30 Units into the skin at bedtime. 30 mL 3   Insulin Pen Needle (PEN NEEDLES) 31G X 6 MM MISC Use to inject insulin once daily 100 each 3   metoprolol tartrate (LOPRESSOR) 25 MG tablet TAKE 1 TABLET(25 MG) BY MOUTH TWICE DAILY 180 tablet 3   thyroid (NP THYROID) 60 MG tablet TAKE 1 TABLET BY MOUTH EVERY DAY BEFORE BREAKFAST 90 tablet 3   Vitamin D, Ergocalciferol, (DRISDOL) 1.25 MG (50000 UNIT) CAPS capsule TAKE 1 CAPSULE BY MOUTH EVERY 7 DAYS 12 capsule 0    pantoprazole (PROTONIX) 40 MG tablet TAKE 1 TABLET(40 MG) BY MOUTH TWICE DAILY (Patient not taking: Reported on 01/01/2024) 60 tablet 2   No current facility-administered medications for this visit.    Allergies as of 01/01/2024 - Review Complete 01/01/2024  Allergen Reaction Noted   Advair diskus [fluticasone-salmeterol] Anaphylaxis 05/18/2012   Codeine Nausea And Vomiting 05/18/2012   Hydrocodone Nausea And Vomiting 11/14/2021   Levothyroxine Other (See Comments) 11/14/2021   Oxycodone Nausea And Vomiting 11/14/2021   Statins  10/23/2022   Tetracyclines & related Itching 05/18/2012    Social History   Socioeconomic History   Marital status: Widowed    Spouse name: Not on file   Number of children: Not on file   Years of education: Not on file   Highest education level: GED or equivalent  Occupational History   Not on file  Tobacco Use   Smoking status: Former    Current packs/day: 0.00    Average packs/day: 1 pack/day for 40.0 years (40.0 ttl pk-yrs)    Types: Cigarettes    Start date: 07/19/1972    Quit date: 07/19/2012    Years since quitting: 11.4    Passive exposure: Past   Smokeless tobacco: Never   Tobacco comments:    quit smoking in 2012-07-25 Vaping Use   Vaping status: Never Used  Substance and Sexual Activity   Alcohol use: No    Alcohol/week: 0.0 standard drinks of alcohol   Drug use: No   Sexual activity: Not Currently    Birth control/protection: Surgical  Other Topics Concern   Not on file  Social History Narrative   Widow,husband died October 26, 2020after 35 years marriage.Retired.   Social Drivers of Corporate investment banker Strain: Low Risk  (08/29/2023)   Overall Financial Resource Strain (CARDIA)    Difficulty of Paying Living Expenses: Not hard at all  Food Insecurity: No Food Insecurity (08/29/2023)   Hunger Vital Sign    Worried About Running Out of Food in the Last Year: Never true    Ran Out of Food in the Last Year: Never true   Transportation Needs: No Transportation Needs (08/29/2023)   PRAPARE - Administrator, Civil Service (Medical): No    Lack of Transportation (Non-Medical): No  Physical Activity: Sufficiently Active (08/29/2023)   Exercise  Vital Sign    Days of Exercise per Week: 2 days    Minutes of Exercise per Session: 90 min  Stress: No Stress Concern Present (08/29/2023)   [employer] of Occupational Health - Occupational Stress Questionnaire    Feeling of Stress : Only a little  Social Connections: Moderately Isolated (08/29/2023)   Social Connection and Isolation Panel [NHANES]    Frequency of Communication with Friends and Family: Twice a week    Frequency of Social Gatherings with Friends and Family: Twice a week    Attends Religious Services: More than 4 times per year    Active Member of [employer] or Organizations: No    Attends Banker Meetings: Not on file    Marital Status: Widowed    Review of systems General: negative for malaise, night sweats, fever, chills, weight loss Neck: Negative for lumps, goiter, pain and significant neck swelling Resp: Negative for cough, wheezing, dyspnea at rest CV: Negative for chest pain, leg swelling, palpitations, orthopnea GI: denies melena, hematochezia, nausea, vomiting, diarrhea, constipation, dysphagia, odyonophagia, early satiety or unintentional weight loss. +flatulence  The remainder of the review of systems is noncontributory.  Physical Exam: BP (!) 124/55   Pulse (!) 106   Temp 97.6 F (36.4 C)   Ht 5\' 6"  (1.676 m)   Wt 150 lb 9.6 oz (68.3 kg)   BMI 24.31 kg/m  General:   Alert and oriented. No distress noted. Pleasant and cooperative.  Head:  Normocephalic and atraumatic. Eyes:  Conjuctiva clear without scleral icterus. Mouth:  Oral mucosa pink and moist. Good dentition. No lesions. Heart: Normal rate and rhythm, s1 and s2 heart sounds present.  Lungs: Clear lung sounds in all lobes. Respirations equal  and unlabored. Abdomen:  +BS, soft, non-tender and non-distended. No rebound or guarding. No HSM or masses noted. Neurologic:  Alert and  oriented x4 Psych:  Alert and cooperative. Normal mood and affect.  Invalid input(s): "6 MONTHS"   ASSESSMENT: [name] is a 73 y.o. female presenting today for follow up of GERD/abdominal pain and fatty liver/elevated LFTs  GERD/upper abdominal pain: pain had improved previously with protonix 40mg  daily though she notes he had stopped this and was feeling okay, started having some excess gas so she resumed this to see if it helped. She denies any GERD symptoms or abdominal discomfort. Discussed trying an otc daily probiotic for gas, she can also use phazyme,gas x or beano PRN. If she has associated pain, bloating or worsening of her gas symptoms, she should make me aware.   Fatty liver/Elevated LFTs:elevated since jan 2024, thought potentially influenced by previous statin therapy initiation which she came off of soon after starting. Liver serologies in mid 2024 were all negative, iron panel WNL. She had abd [place] in early 2023 which showed fatty liver. She does not drink etoh or take tylenol frequently.She was recommended in the past to have US liver elastography which she declined.  suspect elevated LFTs are likely secondary to fatty liver, most recent LFTs in march were normal. Her platelet count has remained above 150k. At this time encouraged her to continue with liver healthy diet and good weight management.     PLAN:  Start daily probiotic  2. Can take PPI PRN  3. Phazyme/gas x or beano as needed for gas 4. Good weight mangement and healthy diet for fatty liver 5. Pt to make me aware of new or worsening GI symptoms   All questions were answered, patient  verbalized understanding and is in agreement with plan as outlined above.   Follow Up: 1 year   Truc Winfree L. Jeanmarie Hubert, MSN, APRN, AGNP-C Adult-Gerontology Nurse Practitioner [facility]  for GI [facility]  I have reviewed the note and agree with the APP's assessment as described in this progress note  Katrinka Blazing, MD Gastroenterology and Hepatology [facility]

## 2024-01-01 NOTE — Patient Instructions (Addendum)
 Liver enzymes in march looked good! Please continue with healthy diet and good weight management for your fatty liver For the gas, you can try an over the counter probiotic and can also use phazyme, gas x or beano as needed Please let me know if you have any new or worsening symptoms   Follow up 1 year  It was a pleasure to see you today. I want to create trusting relationships with patients and provide genuine, compassionate, and quality care. I truly value your feedback! please be on the lookout for a survey regarding your visit with me today. I appreciate your input about our visit and your time in completing this!    [name] L. Brandi Hubert, MSN, APRN, AGNP-C Adult-Gerontology Nurse Practitioner [facility] Gastroenterology at [facility]

## 2024-01-05 ENCOUNTER — Encounter: Payer: Self-pay | Admitting: Internal Medicine

## 2024-01-06 ENCOUNTER — Encounter: Payer: Self-pay | Admitting: Internal Medicine

## 2024-01-06 ENCOUNTER — Telehealth (INDEPENDENT_AMBULATORY_CARE_PROVIDER_SITE_OTHER): Admitting: Internal Medicine

## 2024-01-06 DIAGNOSIS — J309 Allergic rhinitis, unspecified: Secondary | ICD-10-CM | POA: Insufficient documentation

## 2024-01-06 MED ORDER — METHYLPREDNISOLONE 4 MG PO TBPK: ORAL_TABLET | ORAL | 0 refills | Status: DC

## 2024-01-06 MED ORDER — FLUTICASONE PROPIONATE 50 MCG/ACT NA SUSP: 2.0000 | Freq: Every day | NASAL | 1 refills | Status: DC

## 2024-01-06 NOTE — Patient Instructions (Addendum)
 Please start taking Prednisone as prescribed.  Please use Flonase for nasal congestion and/or allergies. Please perform sinus rinse as tolerated.  Please perform warm water gargling for sore throat and ear fullness.  Please take Zyrtec or Claritin for allergies.  Okay to use vaporizer for nasal congestion.

## 2024-01-06 NOTE — Progress Notes (Signed)
 Virtual Visit via Video Note   Because of [name]'s co-morbid illnesses, she is at least at moderate risk for complications without adequate follow up.  This format is felt to be most appropriate for this patient at this time.  All issues noted in this document were discussed and addressed.  A limited physical exam was performed with this format.      Evaluation Performed:  Follow-up visit  Date:  01/06/2024   ID:  Brandi Spar Kumari, DOB 08-24-1951, MRN [id number]  Patient Location: Home Provider Location: Office/Clinic  Participants: Patient Location of Patient: Home Location of Provider: Telehealth Consent was obtain for visit to be over via telehealth. I verified that I am speaking with the correct person using two identifiers.  PCP:  Anabel Halon, MD   Chief Complaint: Cough, nasal congestion  History of Present Illness:    [name] is a 73 y.o. female who has a video visit for complaint of nasal congestion, postnasal drip, sinus pressure, headache, cough and sore throat for the last 2 days.  She has tried OTC decongestant with mild relief.  Denies any fever or chills currently.  She has cough with yellowish expectoration at times.  She has also noticed bilateral ear fullness, but denies any ear discharge.  The patient does not have symptoms concerning for COVID-19 infection (fever, chills, cough, or new shortness of breath).   Past Medical, Surgical, Social History, Allergies, and Medications have been Reviewed.  Past Medical History:  Diagnosis Date   Arthritis    Asthma    takes Singulair nightly   Diabetes mellitus    takes levemir daily and Metformin daily   Generalized headaches    GERD (gastroesophageal reflux disease)    takes Omeprazole daily   Herniated disc, cervical    History of kidney stones    Hyperlipidemia    takes Welchol daily   Hypertension    takes Enalapril dialy   Hypothyroidism    takes Synthroid daily   IBS (irritable  bowel syndrome)    IPF (idiopathic pulmonary fibrosis) (HCC) 2024   Joint pain    hands and neck   Nocturia    PONV (postoperative nausea and vomiting)    TIA (transient ischemic attack)    diagnosed by EEG but never knew she had it   Trigger finger    Weakness    Weakness    in both hands   Past Surgical History:  Procedure Laterality Date   ABDOMINAL HYSTERECTOMY  1980   ANTERIOR CERVICAL DECOMP/DISCECTOMY FUSION N/A 10/18/2013   Procedure: Cervical three-four, Cervical four-five, Cervical seven-Thoracic one anterior cervical decompression with fusion plating and bonegraft;  Surgeon: Hewitt Shorts, MD;  Location: [facility];  Service: Neurosurgery;  Laterality: N/A;  Cervical three-four, Cervical four-five, Cervical seven-Thoracic one anterior cervical decompression with fusion plating and bonegraft   APPENDECTOMY     done with hysterectomy   BACK SURGERY  2007   lumbar fusion   BIOPSY  03/19/2023   Procedure: BIOPSY;  Surgeon: Marguerita Merles, Reuel Boom, MD;  Location: [facility];  Service: Gastroenterology;;   carpel tunnel Bilateral    left first then right   cataract implants     CHOLECYSTECTOMY N/A 11/22/2021   Procedure: LAPAROSCOPIC CHOLECYSTECTOMY;  Surgeon: Lewie Chamber, DO;  Location: [facility];  Service: General;  Laterality: N/A;   COLONOSCOPY     COLONOSCOPY WITH PROPOFOL N/A 11/27/2022   Procedure: COLONOSCOPY WITH PROPOFOL;  Surgeon: Marguerita Merles, Reuel Boom, MD;  Location: [facility];  Service: Gastroenterology;  Laterality: N/A;  10:00am, asa 1-2   CYSTOSCOPY WITH RETROGRADE PYELOGRAM, URETEROSCOPY AND STENT PLACEMENT Left 04/08/2022   Procedure: CYSTOSCOPY WITH RETROGRADE PYELOGRAM, URETEROSCOPY AND STENT PLACEMENT;  Surgeon: Marcine Matar, MD;  Location: [facility];  Service: Urology;  Laterality: Left;   ESOPHAGOGASTRODUODENOSCOPY     hx of h pylori   ESOPHAGOGASTRODUODENOSCOPY (EGD) WITH PROPOFOL N/A 03/19/2023    Procedure: ESOPHAGOGASTRODUODENOSCOPY (EGD) WITH PROPOFOL;  Surgeon: Dolores Frame, MD;  Location: [facility];  Service: Gastroenterology;  Laterality: N/A;  10:30am;asa 2   EYE SURGERY  11/18/2008   right - cataracts   EYE SURGERY  06/01/2009   left - cataracts   herniated disc   10/2000, 08/31/2008   C5-C6 then C6-C7   HOLMIUM LASER APPLICATION Left 04/08/2022   Procedure: HOLMIUM LASER APPLICATION;  Surgeon: Marcine Matar, MD;  Location: [facility];  Service: Urology;  Laterality: Left;   IR URETERAL STENT RIGHT NEW ACCESS W/O SEP NEPHROSTOMY CATH  07/26/2021   knee anthroscopy bilateral Bilateral    KNEE ARTHROSCOPY     LITHOTRIPSY  11/12/2007   right side   NEPHROLITHOTOMY Right 07/26/2021   Procedure: NEPHROLITHOTOMY PERCUTANEOUS;  Surgeon: Marcine Matar, MD;  Location: [facility];  Service: Urology;  Laterality: Right;   Parathyroid surgery     October 2013   PARATHYROIDECTOMY  07/02/2012   Procedure: PARATHYROIDECTOMY;  Surgeon: Velora Heckler, MD;  Location: [facility];  Service: General;  Laterality: N/A;   TONSILLECTOMY     as a child     Current Meds  Medication Sig   Accu-Chek Softclix Lancets lancets Use as instructed to monitor glucose twice daily   Accu-Chek Softclix Lancets lancets Use as instructed to monitor glucose twice daily.   acetaminophen (TYLENOL) 500 MG tablet Take 500 mg by mouth every 6 (six) hours as needed.   amLODipine (NORVASC) 5 MG tablet TAKE 1 TABLET(5 MG) BY MOUTH DAILY   blood glucose meter kit and supplies KIT Dispense based on patient and insurance preference. Use twice daily (before breakfast and before bed).   glipiZIDE (GLUCOTROL XL) 2.5 MG 24 hr tablet Take 1 tablet (2.5 mg total) by mouth daily with breakfast.   glucose blood (ACCU-CHEK GUIDE TEST) test strip Use as instructed to monitor glucose twice daily.   glucose blood (ACCU-CHEK GUIDE) test strip USE TO CHECK BLOOD SUGAR TWICE DAILY, ONCE BEFORE BREAKFAST AND  ONCE BEFORE BEDTIME   insulin glargine (LANTUS) 100 unit/mL SOPN Inject 30 Units into the skin at bedtime.   Insulin Pen Needle (PEN NEEDLES) 31G X 6 MM MISC Use to inject insulin once daily   metoprolol tartrate (LOPRESSOR) 25 MG tablet TAKE 1 TABLET(25 MG) BY MOUTH TWICE DAILY   pantoprazole (PROTONIX) 40 MG tablet TAKE 1 TABLET(40 MG) BY MOUTH TWICE DAILY   thyroid (NP THYROID) 60 MG tablet TAKE 1 TABLET BY MOUTH EVERY DAY BEFORE BREAKFAST   Vitamin D, Ergocalciferol, (DRISDOL) 1.25 MG (50000 UNIT) CAPS capsule TAKE 1 CAPSULE BY MOUTH EVERY 7 DAYS     Allergies:   Advair diskus [fluticasone-salmeterol], Codeine, Hydrocodone, Levothyroxine, Oxycodone, Statins, and Tetracyclines & related   ROS:   Please see the history of present illness. All other systems reviewed and are negative.   Labs/Other Tests and Data Reviewed:    Recent Labs: 03/04/2023: Hemoglobin 14.6; Platelets 231 12/08/2023: ALT 24; BUN 17; Creatinine, Ser 0.92; Potassium 5.0; Sodium  147; TSH 1.100   Recent Lipid Panel Lab Results  Component Value Date/Time   CHOL 174 10/21/2022 08:37 AM   TRIG 182 (H) 10/21/2022 08:37 AM   HDL 37 (L) 10/21/2022 08:37 AM   CHOLHDL 4.7 (H) 10/21/2022 08:37 AM   CHOLHDL 3.9 12/25/2020 10:34 AM   LDLCALC 105 (H) 10/21/2022 08:37 AM   LDLCALC 85 12/25/2020 10:34 AM    Wt Readings from Last 3 Encounters:  01/01/24 150 lb 9.6 oz (68.3 kg)  12/15/23 149 lb (67.6 kg)  11/13/23 152 lb (68.9 kg)     Objective:    Vital Signs:  There were no vitals taken for this visit.   VITAL SIGNS:  reviewed GEN:  no acute distress EYES:  sclerae anicteric, EOMI - Extraocular Movements Intact RESPIRATORY:  normal respiratory effort, symmetric expansion NEURO:  alert and oriented x 3, no obvious focal deficit PSYCH:  normal affect  ASSESSMENT & PLAN:    Allergic sinusitis Her symptoms are likely related to recent high amount of pollen Started Medrol Dosepak considering recurrent bronchitis  episodes Flonase for allergies and nasal congestion Zyrtec or Claritin as needed for allergies Advised to use vaporizer for nasal congestion Sinus rinse as tolerated  I discussed the assessment and treatment plan with the patient. The patient was provided an opportunity to ask questions, and all were answered. The patient agreed with the plan and demonstrated an understanding of the instructions.   The patient was advised to call back or seek an in-person evaluation if the symptoms worsen or if the condition fails to improve as anticipated.  The above assessment and management plan was discussed with the patient. The patient verbalized understanding of and has agreed to the management plan.   Medication Adjustments/Labs and Tests Ordered: Current medicines are reviewed at length with the patient today.  Concerns regarding medicines are outlined above.   Tests Ordered: No orders of the defined types were placed in this encounter.   Medication Changes: No orders of the defined types were placed in this encounter.    Note: This dictation was prepared with Dragon dictation along with smaller phrase technology. Similar sounding words can be transcribed inadequately or may not be corrected upon review. Any transcriptional errors that result from this process are unintentional.      Disposition:  Follow up  Signed, Anabel Halon, MD  01/06/2024 11:49 AM     [facility]

## 2024-01-06 NOTE — Assessment & Plan Note (Signed)
 Her symptoms are likely related to recent high amount of pollen Started Medrol Dosepak considering recurrent bronchitis episodes Flonase for allergies and nasal congestion Zyrtec or Claritin as needed for allergies Advised to use vaporizer for nasal congestion Sinus rinse as tolerated

## 2024-01-09 ENCOUNTER — Encounter: Payer: Self-pay | Admitting: Internal Medicine

## 2024-01-09 ENCOUNTER — Encounter (HOSPITAL_BASED_OUTPATIENT_CLINIC_OR_DEPARTMENT_OTHER): Payer: Self-pay | Admitting: Pulmonary Disease

## 2024-01-09 MED ORDER — ALBUTEROL SULFATE HFA 108 (90 BASE) MCG/ACT IN AERS: 2.0000 | INHALATION_SPRAY | Freq: Four times a day (QID) | RESPIRATORY_TRACT | 6 refills | Status: AC | PRN

## 2024-01-09 NOTE — Telephone Encounter (Signed)
**Note De-identified  [place] Obfuscation** Please advise 

## 2024-01-12 ENCOUNTER — Telehealth: Payer: Self-pay

## 2024-01-12 ENCOUNTER — Other Ambulatory Visit: Payer: Self-pay | Admitting: Internal Medicine

## 2024-01-12 DIAGNOSIS — I10 Essential (primary) hypertension: Secondary | ICD-10-CM

## 2024-01-12 NOTE — Telephone Encounter (Signed)
 Spoke with patient and she states she does not have any questions any longer. She states she has already scheduled a video visit to go over her test results and that other than this she does not currently have any questions. Nothing further needed at this time.

## 2024-01-12 NOTE — Telephone Encounter (Signed)
 Copied from CRM [id number]. Topic: Clinical - Medical Advice >> Jan 12, 2024  1:07 PM Margarette Shawl wrote: Reason for CRM:   Patient is having symptoms of drainage and cough. Was recently treated for upper respiratory symptoms and finished course of prednisone today. Has been advised symptoms are most likely due to pollen.   Would like advisement on what OTC meds she can take to help with symptoms\  CB#  [phone]

## 2024-01-15 NOTE — Progress Notes (Signed)
 Lung function is normal and looks stable for the most part. Very slight drop in DLCO but corrects for alveolar volume to normal. Can discuss further at her upcoming follow up. Thanks

## 2024-01-19 ENCOUNTER — Telehealth: Payer: Self-pay

## 2024-01-19 ENCOUNTER — Ambulatory Visit: Payer: Self-pay

## 2024-01-19 NOTE — Telephone Encounter (Signed)
 LM to give a call back (results) could not leave detail message

## 2024-01-19 NOTE — Telephone Encounter (Signed)
 Pt calling in requesting PFT results, this RN relayed provider's note. Pt reports she has VV scheduled Friday for results and would like to know if this appt is necessary or if she can cancel. Pt does not have anything further questions related to the results at this time.    Copied from CRM [id number]. Topic: Clinical - Lab/Test Results >> Jan 19, 2024  9:25 AM Juliana Ocean wrote: Reason for CRM: message to go over breathing test Reason for Disposition  Caller requesting lab results  (Exception: Routine or non-urgent lab result.)  Answer Assessment - Initial Assessment Questions 1. REASON FOR CALL or QUESTION: "What is your reason for calling today?" or "How can I best help you?" or "What question do you have that I can help answer?"     PFT results 2. CALLER: Document the source of call. (e.g., laboratory, patient).     Patient  Protocols used: PCP Call - No Triage-A-AH

## 2024-01-19 NOTE — Telephone Encounter (Signed)
ATC x1 LVM for patient to call our office back. 

## 2024-01-22 NOTE — Telephone Encounter (Signed)
 Pt already cancelled her appt with us  for 01/23/24  Will go ahead and close encounter

## 2024-01-23 ENCOUNTER — Ambulatory Visit: Admitting: Adult Health

## 2024-01-27 ENCOUNTER — Ambulatory Visit (HOSPITAL_COMMUNITY)
Admission: RE | Admit: 2024-01-27 | Discharge: 2024-01-27 | Disposition: A | Discharge status: Home / Self Care | Payer: Medicare Other | Source: Ambulatory Visit | Attending: Nurse Practitioner | Admitting: Nurse Practitioner

## 2024-01-27 DIAGNOSIS — J849 Interstitial pulmonary disease, unspecified: Secondary | ICD-10-CM | POA: Diagnosis not present

## 2024-01-27 DIAGNOSIS — J432 Centrilobular emphysema: Secondary | ICD-10-CM | POA: Diagnosis not present

## 2024-01-28 ENCOUNTER — Ambulatory Visit (INDEPENDENT_AMBULATORY_CARE_PROVIDER_SITE_OTHER)

## 2024-01-28 DIAGNOSIS — E1169 Type 2 diabetes mellitus with other specified complication: Secondary | ICD-10-CM

## 2024-01-28 DIAGNOSIS — Z01411 Encounter for gynecological examination (general) (routine) with abnormal findings: Secondary | ICD-10-CM | POA: Diagnosis not present

## 2024-01-28 DIAGNOSIS — R300 Dysuria: Secondary | ICD-10-CM | POA: Diagnosis not present

## 2024-01-28 DIAGNOSIS — N952 Postmenopausal atrophic vaginitis: Secondary | ICD-10-CM | POA: Diagnosis not present

## 2024-01-28 DIAGNOSIS — E119 Type 2 diabetes mellitus without complications: Secondary | ICD-10-CM

## 2024-01-28 DIAGNOSIS — N898 Other specified noninflammatory disorders of vagina: Secondary | ICD-10-CM | POA: Diagnosis not present

## 2024-01-28 LAB — HM DIABETES EYE EXAM

## 2024-01-28 NOTE — Progress Notes (Signed)
 [name] arrived 01/28/2024 and has given verbal consent to obtain images and complete their overdue diabetic retinal screening.  The images have been sent to an ophthalmologist or optometrist for review and interpretation.  Results will be sent back to Meldon Sport, MD for review.  Patient has been informed they will be contacted when we receive the results via telephone or MyChart

## 2024-02-16 ENCOUNTER — Telehealth: Payer: Self-pay

## 2024-02-16 NOTE — Telephone Encounter (Signed)
 Lmtrc

## 2024-02-16 NOTE — Telephone Encounter (Signed)
 Copied from CRM [id number]. Topic: Clinical - Request for Lab/Test Order >> Feb 16, 2024  1:15 PM Crispin Dolphin wrote: Reason for CRM: Patient called. States she started a relationship and became sexually active for the 1st time in years with her new boyfriend. Her boyfriend just tested positive for Herpes. He had a breakout. Patient currently not having symptoms but does want to be tested. She wants to know if she can be tested if she does not have symptoms will it show on test. Would like someone to call her and advise and schedule if she can be tested. Thank You

## 2024-02-25 DIAGNOSIS — L309 Dermatitis, unspecified: Secondary | ICD-10-CM | POA: Diagnosis not present

## 2024-03-02 ENCOUNTER — Ambulatory Visit: Payer: Medicare Other | Admitting: Internal Medicine

## 2024-03-03 ENCOUNTER — Other Ambulatory Visit: Payer: Self-pay | Admitting: Nurse Practitioner

## 2024-03-05 ENCOUNTER — Encounter: Payer: Self-pay | Admitting: Nurse Practitioner

## 2024-03-05 MED ORDER — VITAMIN D (ERGOCALCIFEROL) 1.25 MG (50000 UNIT) PO CAPS: 50000.0000 [IU] | ORAL_CAPSULE | ORAL | 3 refills | Status: AC

## 2024-03-09 ENCOUNTER — Ambulatory Visit: Payer: Medicare Other | Admitting: Internal Medicine

## 2024-03-09 ENCOUNTER — Encounter: Payer: Self-pay | Admitting: Internal Medicine

## 2024-03-09 VITALS — BP 130/61 | HR 82 | Ht 66.0 in | Wt 148.2 lb

## 2024-03-09 DIAGNOSIS — E1169 Type 2 diabetes mellitus with other specified complication: Secondary | ICD-10-CM | POA: Diagnosis not present

## 2024-03-09 DIAGNOSIS — I10 Essential (primary) hypertension: Secondary | ICD-10-CM

## 2024-03-09 DIAGNOSIS — E782 Mixed hyperlipidemia: Secondary | ICD-10-CM

## 2024-03-09 DIAGNOSIS — K219 Gastro-esophageal reflux disease without esophagitis: Secondary | ICD-10-CM | POA: Diagnosis not present

## 2024-03-09 DIAGNOSIS — E039 Hypothyroidism, unspecified: Secondary | ICD-10-CM

## 2024-03-09 DIAGNOSIS — Z794 Long term (current) use of insulin: Secondary | ICD-10-CM | POA: Diagnosis not present

## 2024-03-09 DIAGNOSIS — E119 Type 2 diabetes mellitus without complications: Secondary | ICD-10-CM | POA: Diagnosis not present

## 2024-03-09 NOTE — Assessment & Plan Note (Signed)
 Advised to follow low-cholesterol diet Has statin intolerance Check lipid profile

## 2024-03-09 NOTE — Assessment & Plan Note (Signed)
 BP Readings from Last 1 Encounters:  03/09/24 130/61   Overall remains well-controlled with Amlodipine  5 mg QD and Metoprolol  12.5 mg BID Due to borderline low BP, had advised to decrease dose of metoprolol  to 12.5 mg BID - which was started in 2017 for unclear chest pain and HTN, denies any chest pain or palpitations currently Counseled for compliance with the medications Advised DASH diet and moderate exercise/walking, at least 150 mins/week

## 2024-03-09 NOTE — Progress Notes (Signed)
 Established Patient Office Visit  Subjective:  Patient ID: [name], female    DOB: 08-Dec-1950  Age: 73 y.o. MRN: [id number]  CC:  Chief Complaint  Patient presents with   Medical Management of Chronic Issues    6 month f/u     HPI [name] is a 73 y.o. female with past medical history of HTN, GERD, type II DM, hypothyroidism, HLD and parathyroid  adenoma who presents for f/u of her chronic medical conditions.  HTN: BP is well-controlled. Takes amlodipine  5 mg QD and metoprolol  12.5 mg BID regularly. Patient denies headache, dizziness, chest pain, dyspnea or palpitations.  She is still on NP thyroid  for hypothyroidism.  Followed by endocrinology.  NAFLD, GERD: She had elevated liver enzymes, but have improved now. She has had better response for epigastric pain with pantoprazole  compared to omeprazole .  She was seen by GI as well. Denies any nausea or vomiting.  Her US  of abdomen 01/23 showed hepatic steatosis.  Nephrolithiasis: She had get cystoscopy with stent placement in 07/23. She denies any gross hematuria currently.  Denies any fever, chills, nausea, vomiting or urinary hesitancy.  Adjustment disorder: She had been feeling lonely before, fatigued and less motivated for her routine activities.  She has felt better recently as she has a partner now. She denies any SI or HI currently.  Past Medical History:  Diagnosis Date   Arthritis    Asthma    takes Singulair  nightly   Diabetes mellitus    takes levemir  daily and Metformin  daily   Generalized headaches    GERD (gastroesophageal reflux disease)    takes Omeprazole  daily   Herniated disc, cervical    History of kidney stones    Hyperlipidemia    takes Welchol  daily   Hypertension    takes Enalapril  dialy   Hypothyroidism    takes Synthroid  daily   IBS (irritable bowel syndrome)    IPF (idiopathic pulmonary fibrosis) (HCC) 2024   Joint pain    hands and neck   Nocturia    PONV (postoperative nausea  and vomiting)    TIA (transient ischemic attack)    diagnosed by EEG but never knew she had it   Trigger finger    Weakness    Weakness    in both hands    Past Surgical History:  Procedure Laterality Date   ABDOMINAL HYSTERECTOMY  1980   ANTERIOR CERVICAL DECOMP/DISCECTOMY FUSION N/A 10/18/2013   Procedure: Cervical three-four, Cervical four-five, Cervical seven-Thoracic one anterior cervical decompression with fusion plating and bonegraft;  Surgeon: Corrina Dimitri, MD;  Location: [facility];  Service: Neurosurgery;  Laterality: N/A;  Cervical three-four, Cervical four-five, Cervical seven-Thoracic one anterior cervical decompression with fusion plating and bonegraft   APPENDECTOMY     done with hysterectomy   BACK SURGERY  2007   lumbar fusion   BIOPSY  03/19/2023   Procedure: BIOPSY;  Surgeon: Umberto Ganong, Bearl Limes, MD;  Location: [facility];  Service: Gastroenterology;;   carpel tunnel Bilateral    left first then right   cataract implants     CHOLECYSTECTOMY N/A 11/22/2021   Procedure: LAPAROSCOPIC CHOLECYSTECTOMY;  Surgeon: Marijo Shove, DO;  Location: [facility];  Service: General;  Laterality: N/A;   COLONOSCOPY     COLONOSCOPY WITH PROPOFOL  N/A 11/27/2022   Procedure: COLONOSCOPY WITH PROPOFOL ;  Surgeon: Urban Garden, MD;  Location: [facility];  Service: Gastroenterology;  Laterality: N/A;  10:00am, asa 1-2   CYSTOSCOPY  WITH RETROGRADE PYELOGRAM, URETEROSCOPY AND STENT PLACEMENT Left 04/08/2022   Procedure: CYSTOSCOPY WITH RETROGRADE PYELOGRAM, URETEROSCOPY AND STENT PLACEMENT;  Surgeon: Trent Frizzle, MD;  Location: [facility];  Service: Urology;  Laterality: Left;   ESOPHAGOGASTRODUODENOSCOPY     hx of h pylori   ESOPHAGOGASTRODUODENOSCOPY (EGD) WITH PROPOFOL  N/A 03/19/2023   Procedure: ESOPHAGOGASTRODUODENOSCOPY (EGD) WITH PROPOFOL ;  Surgeon: Urban Garden, MD;  Location: [facility];  Service:  Gastroenterology;  Laterality: N/A;  10:30am;asa 2   EYE SURGERY  11/18/2008   right - cataracts   EYE SURGERY  06/01/2009   left - cataracts   herniated disc   10/2000, 08/31/2008   C5-C6 then C6-C7   HOLMIUM LASER APPLICATION Left 04/08/2022   Procedure: HOLMIUM LASER APPLICATION;  Surgeon: Trent Frizzle, MD;  Location: [facility];  Service: Urology;  Laterality: Left;   IR URETERAL STENT RIGHT NEW ACCESS W/O SEP NEPHROSTOMY CATH  07/26/2021   knee anthroscopy bilateral Bilateral    KNEE ARTHROSCOPY     LITHOTRIPSY  11/12/2007   right side   NEPHROLITHOTOMY Right 07/26/2021   Procedure: NEPHROLITHOTOMY PERCUTANEOUS;  Surgeon: Trent Frizzle, MD;  Location: [facility];  Service: Urology;  Laterality: Right;   Parathyroid  surgery     08-07-2012  PARATHYROIDECTOMY  07/02/2012   Procedure: PARATHYROIDECTOMY;  Surgeon: Keitha Pata, MD;  Location: [facility];  Service: General;  Laterality: N/A;   TONSILLECTOMY     as a child    Family History  Problem Relation Age of Onset   Cancer Mother        pancreatic    Social History   Socioeconomic History   Marital status: Widowed    Spouse name: Not on file   Number of children: Not on file   Years of education: Not on file   Highest education level: GED or equivalent  Occupational History   Not on file  Tobacco Use   Smoking status: Former    Current packs/day: 0.00    Average packs/day: 1 pack/day for 40.0 years (40.0 ttl pk-yrs)    Types: Cigarettes    Start date: 07/19/1972    Quit date: 07/19/2012    Years since quitting: 11.6    Passive exposure: Past   Smokeless tobacco: Never   Tobacco comments:    quit smoking in 08-07-12 Vaping Use   Vaping status: Never Used  Substance and Sexual Activity   Alcohol use: No    Alcohol/week: 0.0 standard drinks of alcohol   Drug use: No   Sexual activity: Not Currently    Birth control/protection: Surgical  Other Topics Concern   Not on file  Social History  Narrative   Widow,husband died 08-Nov-2020after 35 years marriage.Retired.   Social Drivers of Corporate investment banker Strain: Low Risk  (03/05/2024)   Overall Financial Resource Strain (CARDIA)    Difficulty of Paying Living Expenses: Not hard at all  Food Insecurity: No Food Insecurity (03/05/2024)   Hunger Vital Sign    Worried About Running Out of Food in the Last Year: Never true    Ran Out of Food in the Last Year: Never true  Transportation Needs: No Transportation Needs (03/05/2024)   PRAPARE - Administrator, Civil Service (Medical): No    Lack of Transportation (Non-Medical): No  Physical Activity: Sufficiently Active (03/05/2024)   Exercise Vital Sign    Days of Exercise per Week: 3 days  Minutes of Exercise per Session: 100 min  Stress: No Stress Concern Present (03/05/2024)   [employer] of Occupational Health - Occupational Stress Questionnaire    Feeling of Stress : Only a little  Social Connections: Moderately Integrated (03/05/2024)   Social Connection and Isolation Panel [NHANES]    Frequency of Communication with Friends and Family: Three times a week    Frequency of Social Gatherings with Friends and Family: Twice a week    Attends Religious Services: More than 4 times per year    Active Member of [employer] or Organizations: Yes    Attends Banker Meetings: More than 4 times per year    Marital Status: Widowed  Intimate Partner Violence: Not At Risk (03/05/2022)   Humiliation, Afraid, Rape, and Kick questionnaire    Fear of Current or Ex-Partner: No    Emotionally Abused: No    Physically Abused: No    Sexually Abused: No    Outpatient Medications Prior to Visit  Medication Sig Dispense Refill   Accu-Chek Softclix Lancets lancets Use as instructed to monitor glucose twice daily. 100 each 2   acetaminophen  (TYLENOL ) 500 MG tablet Take 500 mg by mouth every 6 (six) hours as needed.     albuterol  (VENTOLIN  HFA) 108 (90 Base) MCG/ACT  inhaler Inhale 2 puffs into the lungs every 6 (six) hours as needed for wheezing or shortness of breath. 8 g 6   amLODipine  (NORVASC ) 5 MG tablet TAKE 1 TABLET(5 MG) BY MOUTH DAILY 90 tablet 1   blood glucose meter kit and supplies KIT Dispense based on patient and insurance preference. Use twice daily (before breakfast and before bed). 1 each 0   fluticasone  (FLONASE ) 50 MCG/ACT nasal spray Place 2 sprays into both nostrils daily. 16 g 1   glipiZIDE  (GLUCOTROL  XL) 2.5 MG 24 hr tablet TAKE 1 TABLET(2.5 MG) BY MOUTH DAILY WITH BREAKFAST 90 tablet 3   glucose blood (ACCU-CHEK GUIDE TEST) test strip Use as instructed to monitor glucose twice daily. 200 each 11   insulin  glargine (LANTUS ) 100 unit/mL SOPN Inject 30 Units into the skin at bedtime. 30 mL 3   Insulin  Pen Needle (PEN NEEDLES) 31G X 6 MM MISC Use to inject insulin  once daily 100 each 3   metoprolol  tartrate (LOPRESSOR ) 25 MG tablet TAKE 1 TABLET(25 MG) BY MOUTH TWICE DAILY 180 tablet 3   pantoprazole  (PROTONIX ) 40 MG tablet TAKE 1 TABLET(40 MG) BY MOUTH TWICE DAILY 60 tablet 2   thyroid  (NP THYROID ) 60 MG tablet TAKE 1 TABLET BY MOUTH EVERY DAY BEFORE BREAKFAST 90 tablet 3   Vitamin D , Ergocalciferol , (DRISDOL ) 1.25 MG (50000 UNIT) CAPS capsule Take 1 capsule (50,000 Units total) by mouth every 7 (seven) days. 12 capsule 3   methylPREDNISolone  (MEDROL  DOSEPAK) 4 MG TBPK tablet Take as package instructions. 1 each 0   Accu-Chek Softclix Lancets lancets Use as instructed to monitor glucose twice daily 200 each 11   glucose blood (ACCU-CHEK GUIDE) test strip USE TO CHECK BLOOD SUGAR TWICE DAILY, ONCE BEFORE BREAKFAST AND ONCE BEFORE BEDTIME 200 strip 1   No facility-administered medications prior to visit.    Allergies  Allergen Reactions   Advair Diskus [Fluticasone -Salmeterol] Anaphylaxis   Codeine Nausea And Vomiting   Hydrocodone  Nausea And Vomiting   Levothyroxine  Other (See Comments)    Caused lower leg swelling   Oxycodone   Nausea And Vomiting   Statins     Muscle and joint pain   Tetracyclines & Related Itching  Internal    ROS Review of Systems  Constitutional:  Negative for chills and fever.  HENT:  Negative for congestion, sinus pressure, sinus pain and sore throat.   Eyes:  Negative for pain and discharge.  Respiratory:  Negative for cough and shortness of breath.   Cardiovascular:  Negative for chest pain and palpitations.  Gastrointestinal:  Negative for diarrhea, nausea and vomiting.  Endocrine: Negative for polydipsia and polyuria.  Genitourinary:  Negative for dysuria and hematuria.  Musculoskeletal:  Positive for arthralgias and myalgias. Negative for neck pain and neck stiffness.  Skin:  Negative for rash.  Neurological:  Negative for dizziness and weakness.  Psychiatric/Behavioral:  Negative for agitation and behavioral problems.       Objective:    Physical Exam Vitals reviewed.  Constitutional:      General: She is not in acute distress.    Appearance: She is not diaphoretic.  HENT:     Head: Normocephalic and atraumatic.     Nose: Nose normal.     Mouth/Throat:     Mouth: Mucous membranes are moist.  Eyes:     General: No scleral icterus.    Extraocular Movements: Extraocular movements intact.  Neck:     Comments: Scar noted, site C/D/I Cardiovascular:     Rate and Rhythm: Normal rate and regular rhythm.     Pulses: Normal pulses.     Heart sounds: Normal heart sounds. No murmur heard. Pulmonary:     Breath sounds: Normal breath sounds. No wheezing or rales.  Musculoskeletal:     Cervical back: Neck supple. No tenderness.     Right lower leg: No edema.     Left lower leg: No edema.  Skin:    General: Skin is warm.     Findings: No rash.  Neurological:     General: No focal deficit present.     Mental Status: She is alert and oriented to person, place, and time.  Psychiatric:        Mood and Affect: Mood normal.        Behavior: Behavior normal.     BP  130/61   Pulse 82   Ht 5\' 6"  (1.676 m)   Wt 148 lb 3.2 oz (67.2 kg)   SpO2 98%   BMI 23.92 kg/m  Wt Readings from Last 3 Encounters:  03/09/24 148 lb 3.2 oz (67.2 kg)  01/01/24 150 lb 9.6 oz (68.3 kg)  12/15/23 149 lb (67.6 kg)    Lab Results  Component Value Date   TSH 1.100 12/08/2023   Lab Results  Component Value Date   WBC 5.2 03/04/2023   HGB 14.6 03/04/2023   HCT 43.9 03/04/2023   MCV 84.7 03/04/2023   PLT 231 03/04/2023   Lab Results  Component Value Date   NA 147 (H) 12/08/2023   K 5.0 12/08/2023   CO2 18 (L) 12/08/2023   GLUCOSE 143 (H) 12/08/2023   BUN 17 12/08/2023   CREATININE 0.92 12/08/2023   BILITOT 0.4 12/08/2023   ALKPHOS 73 12/08/2023   AST 20 12/08/2023   ALT 24 12/08/2023   PROT 6.6 12/08/2023   ALBUMIN 4.7 12/08/2023   CALCIUM 9.4 12/08/2023   ANIONGAP 7 11/20/2021   EGFR 65 12/08/2023   Lab Results  Component Value Date   CHOL 174 10/21/2022   Lab Results  Component Value Date   HDL 37 (L) 10/21/2022   Lab Results  Component Value Date   LDLCALC 105 (H) 10/21/2022  Lab Results  Component Value Date   TRIG 182 (H) 10/21/2022   Lab Results  Component Value Date   CHOLHDL 4.7 (H) 10/21/2022   Lab Results  Component Value Date   HGBA1C 7.1 (A) 12/15/2023      Assessment & Plan:   Problem List Items Addressed This Visit       Cardiovascular and Mediastinum   Essential hypertension - Primary   BP Readings from Last 1 Encounters:  03/09/24 130/61   Overall remains well-controlled with Amlodipine  5 mg QD and Metoprolol  12.5 mg BID Due to borderline low BP, had advised to decrease dose of metoprolol  to 12.5 mg BID - which was started in 2017 for unclear chest pain and HTN, denies any chest pain or palpitations currently Counseled for compliance with the medications Advised DASH diet and moderate exercise/walking, at least 150 mins/week      Relevant Orders   CBC     Digestive   GERD (gastroesophageal reflux  disease)   Well-controlled with pantoprazole  40 mg QD Avoid hot and spicy food        Endocrine   Type 2 diabetes mellitus with other specified complication (HCC)   Lab Results  Component Value Date   HGBA1C 7.1 (A) 12/15/2023   Uncontrolled, but improving Associated with HLD and HTN Was diet controlled, recently worse - on Lantus  30 U qHS and glipizide  2.5 mg QD Followed by endocrinology Advised to follow strict low-carb diet for now Advised to follow up with Ophthalmology for diabetic eye exam      Primary hypothyroidism   Lab Results  Component Value Date   TSH 1.100 12/08/2023   On NP thyroid  60 mg QD Followed by Endocrinology        Other   Mixed hyperlipidemia   Advised to follow low-cholesterol diet Has statin intolerance Check lipid profile      Relevant Orders   Lipid Profile     No orders of the defined types were placed in this encounter.   Follow-up: Return in about 6 months (around 09/08/2024) for HTN and DM.    Meldon Sport, MD

## 2024-03-09 NOTE — Assessment & Plan Note (Addendum)
 Lab Results  Component Value Date   HGBA1C 7.1 (A) 12/15/2023   Uncontrolled, but improving Associated with HLD and HTN Was diet controlled, recently worse - on Lantus  30 U qHS and glipizide  2.5 mg QD Followed by endocrinology Advised to follow strict low-carb diet for now Advised to follow up with Ophthalmology for diabetic eye exam

## 2024-03-09 NOTE — Telephone Encounter (Signed)
 Patient has been called to follow up and see if she received them. A message was left asking her to call the office back.

## 2024-03-09 NOTE — Patient Instructions (Addendum)
 Please schedule Medicare Annual Wellness.  Please continue to take medications as prescribed.  Please continue to follow low carb diet and perform moderate exercise/walking at least 150 mins/week.  Please get fasting blood tests done before the next visit.

## 2024-03-09 NOTE — Assessment & Plan Note (Signed)
 Lab Results  Component Value Date   TSH 1.100 12/08/2023   On NP thyroid  60 mg QD Followed by Endocrinology

## 2024-03-09 NOTE — Assessment & Plan Note (Signed)
 Well-controlled with pantoprazole  40 mg QD Avoid hot and spicy food

## 2024-03-10 LAB — COMPREHENSIVE METABOLIC PANEL WITH GFR
ALT: 21 IU/L (ref 0–32)
AST: 19 IU/L (ref 0–40)
Albumin: 4.3 g/dL (ref 3.8–4.8)
Alkaline Phosphatase: 82 IU/L (ref 44–121)
BUN/Creatinine Ratio: 22 (ref 12–28)
BUN: 20 mg/dL (ref 8–27)
Bilirubin Total: 0.4 mg/dL (ref 0.0–1.2)
CO2: 20 mmol/L (ref 20–29)
Calcium: 9 mg/dL (ref 8.7–10.3)
Chloride: 104 mmol/L (ref 96–106)
Creatinine, Ser: 0.9 mg/dL (ref 0.57–1.00)
Globulin, Total: 2 g/dL (ref 1.5–4.5)
Glucose: 143 mg/dL — ABNORMAL HIGH (ref 70–99)
Potassium: 4.7 mmol/L (ref 3.5–5.2)
Sodium: 140 mmol/L (ref 134–144)
Total Protein: 6.3 g/dL (ref 6.0–8.5)
eGFR: 67 mL/min/1.73

## 2024-03-10 LAB — T4, FREE: Free T4: 1.17 ng/dL (ref 0.82–1.77)

## 2024-03-10 LAB — TSH: TSH: 1.65 u[IU]/mL (ref 0.450–4.500)

## 2024-03-10 LAB — T3, FREE: T3, Free: 4.9 pg/mL — ABNORMAL HIGH (ref 2.0–4.4)

## 2024-03-19 ENCOUNTER — Encounter: Payer: Self-pay | Admitting: Nurse Practitioner

## 2024-03-19 ENCOUNTER — Ambulatory Visit (INDEPENDENT_AMBULATORY_CARE_PROVIDER_SITE_OTHER): Admitting: Nurse Practitioner

## 2024-03-19 VITALS — BP 102/70 | HR 82 | Ht 66.0 in | Wt 150.2 lb

## 2024-03-19 DIAGNOSIS — E039 Hypothyroidism, unspecified: Secondary | ICD-10-CM | POA: Diagnosis not present

## 2024-03-19 DIAGNOSIS — Z7984 Long term (current) use of oral hypoglycemic drugs: Secondary | ICD-10-CM

## 2024-03-19 DIAGNOSIS — E119 Type 2 diabetes mellitus without complications: Secondary | ICD-10-CM

## 2024-03-19 DIAGNOSIS — E559 Vitamin D deficiency, unspecified: Secondary | ICD-10-CM | POA: Diagnosis not present

## 2024-03-19 LAB — POCT GLYCOSYLATED HEMOGLOBIN (HGB A1C): Hemoglobin A1C: 7.2 % — AB (ref 4.0–5.6)

## 2024-03-19 MED ORDER — THYROID 60 MG PO TABS: ORAL_TABLET | ORAL | 3 refills | Status: AC

## 2024-03-19 NOTE — Progress Notes (Signed)
 Endocrinology Follow Up Note                                         03/19/2024, 9:47 AM  Subjective:   Subjective    [name] is a 73 y.o.-year-old female patient being seen in follow up after being seen in consultation for hypothyroidism, diabetes, hx parathyroid  adenoma referred by Meldon Sport, MD.   Past Medical History:  Diagnosis Date   Arthritis    Asthma    takes Singulair  nightly   Diabetes mellitus    takes levemir  daily and Metformin  daily   Generalized headaches    GERD (gastroesophageal reflux disease)    takes Omeprazole  daily   Herniated disc, cervical    History of kidney stones    Hyperlipidemia    takes Welchol  daily   Hypertension    takes Enalapril  dialy   Hypothyroidism    takes Synthroid  daily   IBS (irritable bowel syndrome)    IPF (idiopathic pulmonary fibrosis) (HCC) 2024   Joint pain    hands and neck   Nocturia    PONV (postoperative nausea and vomiting)    TIA (transient ischemic attack)    diagnosed by EEG but never knew she had it   Trigger finger    Weakness    Weakness    in both hands    Past Surgical History:  Procedure Laterality Date   ABDOMINAL HYSTERECTOMY  1980   ANTERIOR CERVICAL DECOMP/DISCECTOMY FUSION N/A 10/18/2013   Procedure: Cervical three-four, Cervical four-five, Cervical seven-Thoracic one anterior cervical decompression with fusion plating and bonegraft;  Surgeon: Corrina Dimitri, MD;  Location: [facility];  Service: Neurosurgery;  Laterality: N/A;  Cervical three-four, Cervical four-five, Cervical seven-Thoracic one anterior cervical decompression with fusion plating and bonegraft   APPENDECTOMY     done with hysterectomy   BACK SURGERY  2007   lumbar fusion   BIOPSY  03/19/2023   Procedure: BIOPSY;  Surgeon: Umberto Ganong, Bearl Limes, MD;  Location: [facility];  Service: Gastroenterology;;   carpel tunnel Bilateral    left  first then right   cataract implants     CHOLECYSTECTOMY N/A 11/22/2021   Procedure: LAPAROSCOPIC CHOLECYSTECTOMY;  Surgeon: Marijo Shove, DO;  Location: [facility];  Service: General;  Laterality: N/A;   COLONOSCOPY     COLONOSCOPY WITH PROPOFOL  N/A 11/27/2022   Procedure: COLONOSCOPY WITH PROPOFOL ;  Surgeon: Urban Garden, MD;  Location: [facility];  Service: Gastroenterology;  Laterality: N/A;  10:00am, asa 1-2   CYSTOSCOPY WITH RETROGRADE PYELOGRAM, URETEROSCOPY AND STENT PLACEMENT Left 04/08/2022   Procedure: CYSTOSCOPY WITH RETROGRADE PYELOGRAM, URETEROSCOPY AND STENT PLACEMENT;  Surgeon: Trent Frizzle, MD;  Location: [facility];  Service: Urology;  Laterality: Left;   ESOPHAGOGASTRODUODENOSCOPY     hx of h pylori   ESOPHAGOGASTRODUODENOSCOPY (EGD) WITH PROPOFOL  N/A 03/19/2023   Procedure: ESOPHAGOGASTRODUODENOSCOPY (EGD) WITH PROPOFOL ;  Surgeon: Urban Garden, MD;  Location: [facility];  Service: Gastroenterology;  Laterality: N/A;  10:30am;asa 2   EYE SURGERY  11/18/2008   right - cataracts   EYE SURGERY  06/01/2009   left - cataracts   herniated disc   10/2000, 08/31/2008   C5-C6 then C6-C7   HOLMIUM LASER APPLICATION Left 04/08/2022   Procedure: HOLMIUM LASER APPLICATION;  Surgeon: Trent Frizzle, MD;  Location: [facility];  Service: Urology;  Laterality: Left;   IR URETERAL STENT RIGHT NEW ACCESS W/O SEP NEPHROSTOMY CATH  07/26/2021   knee anthroscopy bilateral Bilateral    KNEE ARTHROSCOPY     LITHOTRIPSY  11/12/2007   right side   NEPHROLITHOTOMY Right 07/26/2021   Procedure: NEPHROLITHOTOMY PERCUTANEOUS;  Surgeon: Trent Frizzle, MD;  Location: [facility];  Service: Urology;  Laterality: Right;   Parathyroid  surgery     Aug 11, 2012  PARATHYROIDECTOMY  07/02/2012   Procedure: PARATHYROIDECTOMY;  Surgeon: Keitha Pata, MD;  Location: [facility];  Service: General;  Laterality: N/A;   TONSILLECTOMY     as a  child    Social History   Socioeconomic History   Marital status: Widowed    Spouse name: Not on file   Number of children: Not on file   Years of education: Not on file   Highest education level: GED or equivalent  Occupational History   Not on file  Tobacco Use   Smoking status: Former    Current packs/day: 0.00    Average packs/day: 1 pack/day for 40.0 years (40.0 ttl pk-yrs)    Types: Cigarettes    Start date: 07/19/1972    Quit date: 07/19/2012    Years since quitting: 11.6    Passive exposure: Past   Smokeless tobacco: Never   Tobacco comments:    quit smoking in 08/11/2012 Vaping Use   Vaping status: Never Used  Substance and Sexual Activity   Alcohol use: No    Alcohol/week: 0.0 standard drinks of alcohol   Drug use: No   Sexual activity: Not Currently    Birth control/protection: Surgical  Other Topics Concern   Not on file  Social History Narrative   Widow,husband died Nov 12, 2020after 35 years marriage.Retired.   Social Drivers of Corporate investment banker Strain: Low Risk  (03/05/2024)   Overall Financial Resource Strain (CARDIA)    Difficulty of Paying Living Expenses: Not hard at all  Food Insecurity: No Food Insecurity (03/05/2024)   Hunger Vital Sign    Worried About Running Out of Food in the Last Year: Never true    Ran Out of Food in the Last Year: Never true  Transportation Needs: No Transportation Needs (03/05/2024)   PRAPARE - Administrator, Civil Service (Medical): No    Lack of Transportation (Non-Medical): No  Physical Activity: Sufficiently Active (03/05/2024)   Exercise Vital Sign    Days of Exercise per Week: 3 days    Minutes of Exercise per Session: 100 min  Stress: No Stress Concern Present (03/05/2024)   [employer] of Occupational Health - Occupational Stress Questionnaire    Feeling of Stress : Only a little  Social Connections: Moderately Integrated (03/05/2024)   Social Connection and Isolation Panel    Frequency  of Communication with Friends and Family: Three times a week    Frequency of Social Gatherings with Friends and Family: Twice a week    Attends Religious Services: More than 4 times per year    Active Member of Clubs or Organizations: Yes  Attends Banker Meetings: More than 4 times per year    Marital Status: Widowed    Family History  Problem Relation Age of Onset   Cancer Mother        pancreatic    Outpatient Encounter Medications as of 03/19/2024  Medication Sig   Accu-Chek Softclix Lancets lancets Use as instructed to monitor glucose twice daily.   acetaminophen  (TYLENOL ) 500 MG tablet Take 500 mg by mouth every 6 (six) hours as needed.   albuterol  (VENTOLIN  HFA) 108 (90 Base) MCG/ACT inhaler Inhale 2 puffs into the lungs every 6 (six) hours as needed for wheezing or shortness of breath.   amLODipine  (NORVASC ) 5 MG tablet TAKE 1 TABLET(5 MG) BY MOUTH DAILY   blood glucose meter kit and supplies KIT Dispense based on patient and insurance preference. Use twice daily (before breakfast and before bed).   fluticasone  (FLONASE ) 50 MCG/ACT nasal spray Place 2 sprays into both nostrils daily.   glipiZIDE  (GLUCOTROL  XL) 2.5 MG 24 hr tablet TAKE 1 TABLET(2.5 MG) BY MOUTH DAILY WITH BREAKFAST   glucose blood (ACCU-CHEK GUIDE TEST) test strip Use as instructed to monitor glucose twice daily.   insulin  glargine (LANTUS ) 100 unit/mL SOPN Inject 30 Units into the skin at bedtime.   Insulin  Pen Needle (PEN NEEDLES) 31G X 6 MM MISC Use to inject insulin  once daily   metoprolol  tartrate (LOPRESSOR ) 25 MG tablet TAKE 1 TABLET(25 MG) BY MOUTH TWICE DAILY   pantoprazole  (PROTONIX ) 40 MG tablet TAKE 1 TABLET(40 MG) BY MOUTH TWICE DAILY (Patient taking differently: as needed.)   Vitamin D , Ergocalciferol , (DRISDOL ) 1.25 MG (50000 UNIT) CAPS capsule Take 1 capsule (50,000 Units total) by mouth every 7 (seven) days.   [DISCONTINUED] thyroid  (NP THYROID ) 60 MG tablet TAKE 1 TABLET BY MOUTH  EVERY DAY BEFORE BREAKFAST   thyroid  (NP THYROID ) 60 MG tablet TAKE 1 TABLET BY MOUTH EVERY DAY BEFORE BREAKFAST   No facility-administered encounter medications on file as of 03/19/2024.    ALLERGIES: Allergies  Allergen Reactions   Advair Diskus [Fluticasone -Salmeterol] Anaphylaxis   Codeine Nausea And Vomiting   Hydrocodone  Nausea And Vomiting   Levothyroxine  Other (See Comments)    Caused lower leg swelling   Oxycodone  Nausea And Vomiting   Statins     Muscle and joint pain   Tetracyclines & Related Itching    Internal   VACCINATION STATUS: Immunization History  Administered Date(s) Administered   Fluad Quad(high Dose 65+) 08/17/2019, 07/29/2021   Influenza Split 07/31/2013   Influenza, High Dose Seasonal PF 07/01/2017, 07/14/2020   Influenza-Unspecified 07/09/2017, 08/09/2022, 07/01/2023   Moderna Sars-Covid-2 Vaccination 11/24/2019, 12/22/2019, 08/30/2020, 02/28/2021   Tdap 07/12/2014     Diabetes She presents for her follow-up diabetic visit. She has type 2 diabetes mellitus. Her disease course has been stable. Associated symptoms include fatigue. There are no hypoglycemic complications. Symptoms are stable. There are no diabetic complications. Risk factors for coronary artery disease include diabetes mellitus, stress, post-menopausal and hypertension. Current diabetic treatment includes insulin  injections and oral agent (monotherapy). She is compliant with treatment most of the time. Her weight is fluctuating minimally. She is following a generally healthy diet. Meal planning includes avoidance of concentrated sweets. She has not had a previous visit with a dietitian. She participates in exercise intermittently. Her breakfast blood glucose range is generally 140-180 mg/dl. (She presents today with her meter and logs showing above target glycemic profile overall.  Her POCT A1c today is 7.2%, essentially unchanged from previous  visit.   Analysis of her meter shows 7-day average  of 160, 14-day average of 154, 30-day average of 158, 90-day average of 158.  She notes her meal timing has been thrown off recently, she has a new boyfriend who works night shift.) An ACE inhibitor/angiotensin II receptor blocker is not being taken. She does not see a podiatrist.Eye exam is current.  Thyroid  Problem Presents for follow-up visit. Symptoms include depressed mood and fatigue. Patient reports no constipation or weight gain. The symptoms have been stable.    [name] is a patient with the above medical history. 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 Oralee Billow.  She is now experiencing similar symptoms that she had at that time and would like to investigate whether or not there could be a problem with one of her other parathyroid  glands.  She thinks she remembers being started on thyroid  hormone replacement after her parathyroid  removal due to possible injury to the thyroid  gland during its removal.  She was a patient of Dr. Ena Harries who had started the patient on NP thyroid  likely as a result of accidental injury to the thyroid  tissue during parathyroidectomy surgery   she was given various doses of NP thyroid  over the years, currently on 60 mg. she reports compliance to this medication:  Taking it daily on empty stomach  with water , separated by >30 minutes before breakfast and other medications, and by at least 4 hours from calcium, iron, PPIs, multivitamins .  I reviewed patient's thyroid  tests:  Lab Results  Component Value Date   TSH 1.650 03/09/2024   TSH 1.100 12/08/2023   TSH 2.890 06/03/2023   TSH 2.920 10/21/2022   TSH 2.300 01/23/2022   TSH 2.120 10/22/2021   TSH 2.930 07/03/2021   TSH 0.57 10/14/2019   TSH 0.15 (L) 06/29/2019   TSH 2.39 06/23/2018   FREET4 1.17 03/09/2024   FREET4 1.18 12/08/2023   FREET4 1.11 06/03/2023   FREET4 1.13 10/21/2022   FREET4 1.26 01/23/2022   FREET4 1.26 10/22/2021    FREET4 1.23 07/03/2021   FREET4 1.4 06/23/2018   FREET4 1.4 04/16/2017   FREET4 1.5 10/14/2016    Pt denies feeling nodules in neck, hoarseness,  SOB with lying down.  She does report intermittent trouble swallowing pills, something new.  she denies family history of thyroid  disorders.  No family history of thyroid  cancer.  No history of radiation therapy to head or neck.  No recent use of iodine supplements.  Denies use of Biotin containing supplements.  I reviewed her chart and she also has a history of parathyroid  adenoma, controlled Diabetes without medications, GERD, HTN.   Review of systems  Constitutional: + Minimally fluctuating body weight,  current Body mass index is 24.24 kg/m. , no fatigue, no subjective hyperthermia, no subjective hypothermia Eyes: no blurry vision, no xerophthalmia ENT: no sore throat, no nodules palpated in throat, no dysphagia/odynophagia, no hoarseness Cardiovascular: no chest pain, no shortness of breath, no palpitations, no leg swelling Respiratory: no cough, no shortness of breath Gastrointestinal: no nausea/vomiting/diarrhea, Skin: no rashes, no hyperemia Neurological: + intermittent tremors, no numbness, no tingling, no dizziness Psychiatric: no depression, no anxiety   Objective:   Objective     BP 102/70 (BP Location: Left Arm, Patient Position: Sitting, Cuff Size: Large)   Pulse 82   Ht 5' 6 (1.676 m)   Wt 150 lb 3.2 oz (68.1 kg)   BMI 24.24 kg/m  Wt Readings from Last 3 Encounters:  03/19/24 150 lb 3.2 oz (68.1 kg)  03/09/24 148 lb 3.2 oz (67.2 kg)  01/01/24 150 lb 9.6 oz (68.3 kg)    BP Readings from Last 3 Encounters:  03/19/24 102/70  03/09/24 130/61  01/01/24 (!) 124/55      Physical Exam- Limited  Constitutional:  Body mass index is 24.24 kg/m. , not in acute distress, normal state of mind Eyes:  EOMI, no exophthalmos Musculoskeletal: no gross deformities, strength intact in all four extremities, no gross  restriction of joint movements Skin:  no rashes, no hyperemia Neurological: no tremor with outstretched hands   Diabetic Foot Exam - Simple   No data filed     CMP ( most recent) CMP     Component Value Date/Time   NA 140 03/09/2024 0929   K 4.7 03/09/2024 0929   CL 104 03/09/2024 0929   CO2 20 03/09/2024 0929   GLUCOSE 143 (H) 03/09/2024 0929   GLUCOSE 211 (H) 04/08/2022 0713   BUN 20 03/09/2024 0929   CREATININE 0.90 03/09/2024 0929   CREATININE 0.81 12/25/2020 1034   CALCIUM 9.0 03/09/2024 0929   PROT 6.3 03/09/2024 0929   ALBUMIN 4.3 03/09/2024 0929   AST 19 03/09/2024 0929   ALT 21 03/09/2024 0929   ALKPHOS 82 03/09/2024 0929   BILITOT 0.4 03/09/2024 0929   GFRNONAA >60 11/20/2021 0916   GFRNONAA 73 12/25/2020 1034   GFRAA 85 12/25/2020 1034     Diabetic Labs (most recent): Lab Results  Component Value Date   HGBA1C 7.2 (A) 03/19/2024   HGBA1C 7.1 (A) 12/15/2023   HGBA1C 7.6 (A) 09/10/2023   MICROALBUR 30mg /l 06/25/2022   MICROALBUR 30 07/10/2021   MICROALBUR 0.3 02/18/2018     Lipid Panel ( most recent) Lipid Panel     Component Value Date/Time   CHOL 174 10/21/2022 0837   TRIG 182 (H) 10/21/2022 0837   HDL 37 (L) 10/21/2022 0837   CHOLHDL 4.7 (H) 10/21/2022 0837   CHOLHDL 3.9 12/25/2020 1034   VLDL 33 (H) 10/14/2016 0721   LDLCALC 105 (H) 10/21/2022 0837   LDLCALC 85 12/25/2020 1034   LABVLDL 32 10/21/2022 0837       Lab Results  Component Value Date   TSH 1.650 03/09/2024   TSH 1.100 12/08/2023   TSH 2.890 06/03/2023   TSH 2.920 10/21/2022   TSH 2.300 01/23/2022   TSH 2.120 10/22/2021   TSH 2.930 07/03/2021   TSH 0.57 10/14/2019   TSH 0.15 (L) 06/29/2019   TSH 2.39 06/23/2018   FREET4 1.17 03/09/2024   FREET4 1.18 12/08/2023   FREET4 1.11 06/03/2023   FREET4 1.13 10/21/2022   FREET4 1.26 01/23/2022   FREET4 1.26 10/22/2021   FREET4 1.23 07/03/2021   FREET4 1.4 06/23/2018   FREET4 1.4 04/16/2017   FREET4 1.5 10/14/2016     US  Thyroid  06/26/21 CLINICAL DATA:  History of parathyroid  adenoma.   EXAM: THYROID  ULTRASOUND   TECHNIQUE: Ultrasound examination of the thyroid  gland and adjacent soft tissues was performed.   COMPARISON:  04/23/2011   FINDINGS: Parenchymal Echotexture: Mildly heterogenous   Isthmus: 0.1 cm, previously 0.2 cm   Right lobe: 4.6 x 1.9 x 1.4 cm, previously 4.1 x 1.4 x 1.4 cm   Left lobe: 3.1 x 1.0 x 1.1 cm, previously 3.9 x 1.5 x 1.3 cm   _________________________________________________________   Estimated total number of nodules >/= 1 cm: 0   Number of spongiform nodules >/=  2 cm not described below (TR1): 0   Number of mixed cystic and solid nodules >/= 1.5 cm not described below (TR2): 0   _________________________________________________________   No discrete nodules. Previously there was a nodule in the left thyroid  lobe that is no longer present.   IMPRESSION: Thyroid  tissue is mildly heterogeneous without discrete thyroid  nodules.     Electronically Signed   By: Elene Griffes M.D.   On: 06/26/2021 11:17    Latest Reference Range & Units 07/03/21 09:18 10/22/21 08:55 01/23/22 09:05 10/21/22 08:37 06/03/23 08:27 12/08/23 08:16  TSH 0.450 - 4.500 uIU/mL 2.930 2.120 2.300 2.920 2.890 1.100  Triiodothyronine,Free,Serum 2.0 - 4.4 pg/mL 4.4     4.7 (H)  T4,Free(Direct) 0.82 - 1.77 ng/dL 1.61 0.96 0.45 4.09 8.11 1.18  Thyroperoxidase Ab SerPl-aCnc 0 - 34 IU/mL <8       Thyroglobulin Antibody 0.0 - 0.9 IU/mL <1.0       (H): Data is abnormally high    Assessment & Plan:   ASSESSMENT / PLAN:  1. History of parathyroid  adenoma- s/p parathyroidectomy  -Her recent calcium levels were normal at 9.1.  She does have low vitamin D  currently on replacement.  2. Hypothyroidism- unspecified  Patient with long-standing hypothyroidism, on NP thyroid  60 mg.  She historically does not tolerate Levothyroxine  (causes significant ankle swelling).  -Her previsit TFTs are  consistent with appropriate hormone replacement (FT3 slightly elevated but denies any symptoms of over-replacement).  She is advised to continue NP thyroid  60 mg po daily before breakfast.    - We discussed about correct intake of levothyroxine , at fasting, with water , separated by at least 30 minutes from breakfast, and separated by more than 4 hours from calcium, iron, multivitamins, acid reflux medications (PPIs). -Patient is made aware of the fact that thyroid  hormone replacement is needed for life, dose to be adjusted by periodic monitoring of thyroid  function tests.  3. Uncontrolled Diabetes  She presents today with her meter and logs showing above target glycemic profile overall.  Her POCT A1c today is 7.2%, essentially unchanged from previous visit.   Analysis of her meter shows 7-day average of 160, 14-day average of 154, 30-day average of 158, 90-day average of 158.  She notes her meal timing has been thrown off recently, she has a new boyfriend who works night shift.  She is advised to continue Lantus  30 units SQ nightly and Glipizide  2.5 mg XL daily with breakfast.  We did talk about avoiding snacks and eating too late at night.  She is advised to check glucose 1-2 times daily, before breakfast and before bed, and to call the clinic if she has readings less than 70 or above 300 for 3 tests in a row.  She did not care for the CGM, prefers to do traditional fingersticks.  - Nutritional counseling repeated at each appointment due to patients tendency to fall back in to old habits.  - The patient admits there is a room for improvement in their diet and drink choices. -  Suggestion is made for the patient to avoid simple carbohydrates from their diet including Cakes, Sweet Desserts / Pastries, Ice Cream, Soda (diet and regular), Sweet Tea, Candies, Chips, Cookies, Sweet Pastries, Store Bought Juices, Alcohol in Excess of 1-2 drinks a day, Artificial Sweeteners, Coffee Creamer, and Sugar-free  Products. This will help patient to have stable blood glucose profile and potentially avoid unintended weight gain.   - I encouraged the patient to switch to unprocessed or minimally  processed complex starch and increased protein intake (animal or plant source), fruits, and vegetables.   - Patient is advised to stick to a routine mealtimes to eat 3 meals a day and avoid unnecessary snacks (to snack only to correct hypoglycemia).  4. Vitamin D  deficiency Her most recent vitamin D  level was 51.2 on 10/21/22.  She reportedly does not tolerate OTC vitamin D , causes upset stomach and diarrhea.  I refilled her Ergocalciferol  50000 units PO weekly which she is tolerating well.       I spent  26  minutes in the care of the patient today including review of labs from CMP, Lipids, Thyroid  Function, Hematology (current and previous including abstractions from other facilities); face-to-face time discussing  her blood glucose readings/logs, discussing hypoglycemia and hyperglycemia episodes and symptoms, medications doses, her options of short and long term treatment based on the latest standards of care / guidelines;  discussion about incorporating lifestyle medicine;  and documenting the encounter. Risk reduction counseling performed per USPSTF guidelines to reduce obesity and cardiovascular risk factors.     Please refer to Patient Instructions for Blood Glucose Monitoring and Insulin /Medications Dosing Guide  in media tab for additional information. Please  also refer to  Patient Self Inventory in the Media  tab for reviewed elements of pertinent patient history.  [name] participated in the discussions, expressed understanding, and voiced agreement with the above plans.  All questions were answered to her satisfaction. she is encouraged to contact clinic should she have any questions or concerns prior to her return visit.    FOLLOW UP PLAN:  Return in about 4 months (around 07/19/2024) for  Diabetes F/U with A1c in office, No previsit labs, Bring meter and logs.   Hulon Magic, [facility] [address] [place], [place] [address] Phone: [phone] Fax: [phone]  03/19/2024, 9:47 AM

## 2024-03-24 DIAGNOSIS — L280 Lichen simplex chronicus: Secondary | ICD-10-CM | POA: Diagnosis not present

## 2024-03-26 ENCOUNTER — Encounter: Payer: Self-pay | Admitting: Internal Medicine

## 2024-03-29 ENCOUNTER — Other Ambulatory Visit: Payer: Self-pay | Admitting: Internal Medicine

## 2024-03-29 DIAGNOSIS — K121 Other forms of stomatitis: Secondary | ICD-10-CM

## 2024-03-29 MED ORDER — VALACYCLOVIR HCL 1 G PO TABS: 1000.0000 mg | ORAL_TABLET | Freq: Two times a day (BID) | ORAL | 1 refills | Status: DC

## 2024-03-30 ENCOUNTER — Encounter (HOSPITAL_BASED_OUTPATIENT_CLINIC_OR_DEPARTMENT_OTHER): Payer: Self-pay

## 2024-05-06 DIAGNOSIS — L280 Lichen simplex chronicus: Secondary | ICD-10-CM | POA: Diagnosis not present

## 2024-05-06 DIAGNOSIS — L570 Actinic keratosis: Secondary | ICD-10-CM | POA: Diagnosis not present

## 2024-06-03 ENCOUNTER — Encounter (HOSPITAL_BASED_OUTPATIENT_CLINIC_OR_DEPARTMENT_OTHER): Payer: Self-pay | Admitting: Pulmonary Disease

## 2024-06-03 ENCOUNTER — Ambulatory Visit (HOSPITAL_BASED_OUTPATIENT_CLINIC_OR_DEPARTMENT_OTHER): Admitting: Pulmonary Disease

## 2024-06-03 VITALS — BP 132/73 | HR 81 | Ht 66.0 in | Wt 150.0 lb

## 2024-06-03 DIAGNOSIS — J849 Interstitial pulmonary disease, unspecified: Secondary | ICD-10-CM

## 2024-06-03 NOTE — Patient Instructions (Signed)
 X Hi res CT chest in April 2026 to monitor condition

## 2024-06-03 NOTE — Progress Notes (Signed)
 Subjective:    Patient ID: [name], female    DOB: 04-10-1951, 73 y.o.   MRN: [id number]     73 yo remote smoker for FU of ILD incidentally detected on screening CT scan.    PMH -hypertension Parathyroidectomy Diabetes type 2 Asthma She smoked about 20 pack years before she quit 2013   Initial OV 11/2022 >>On ambulation oxygen saturation stayed at 98% after 3 laps She prefers to not start medication at this time after much discussion.   Discussed the use of AI scribe software for clinical note transcription with the patient, who gave verbal consent to proceed.  History of Present Illness    Discussed the use of AI scribe software for clinical note transcription with the patient, who gave verbal consent to proceed.  History of Present Illness   [name] is a 73 year old female with idiopathic pulmonary fibrosis who presents for a ten-month follow-up of interstitial lung disease.  She was diagnosed with idiopathic pulmonary fibrosis last year after lung scarring was identified. Breathing tests conducted last year and in April of this year showed stable results. A recent scan was performed and was stable.  She experiences a persistent cough during the winter months, particularly in December and January, lasting for five weeks and recurring for three weeks. She has been treated with prednisone  twice in the past year for these episodes. No new or worsening shortness of breath or persistent cough outside of her usual seasonal allergies.       Significant tests/ events reviewed    PFTs 12/2023 FVC 92% , DLCO 76% , kco 90 PFTs 01/2023 FVC 86%, TLC 95%, DLCO 17.9/84% -normal lung function  HRCT chest 12/2023 >> stable HRCT chest 12/2022 probable UIP, resolving nodules   LDCT chest 10/22/22 >>widespread areas of peripheral predominant ground-glass attenuation, septal thickening, subpleural reticulation and regional areas of architectural distortion scattered throughout  the mid to lower lungs bilaterally, nodular area RLL 10mm indeterminate pattern , mild emphysema    Review of Systems  neg for any significant sore throat, dysphagia, itching, sneezing, nasal congestion or excess/ purulent secretions, fever, chills, sweats, unintended wt loss, pleuritic or exertional cp, hempoptysis, orthopnea pnd or change in chronic leg swelling. Also denies presyncope, palpitations, heartburn, abdominal pain, nausea, vomiting, diarrhea or change in bowel or urinary habits, dysuria,hematuria, rash, arthralgias, visual complaints, headache, numbness weakness or ataxia.      Objective:   Physical Exam  Gen. Pleasant, well-nourished, in no distress ENT - no thrush, no pallor/icterus,no post nasal drip Neck: No JVD, no thyromegaly, no carotid bruits Lungs: no use of accessory muscles, no dullness to percussion, clear without rales or rhonchi  Cardiovascular: Rhythm regular, heart sounds  normal, no murmurs or gallops, no peripheral edema Musculoskeletal: No deformities, no cyanosis or clubbing        Assessment & Plan:   Assessment and Plan Assessment & Plan   Assessment and Plan    Idiopathic pulmonary fibrosis Idiopathic pulmonary fibrosis (IPF) is well-managed both clinically and by imaging. Initially identified last year, recent imaging in April showed no disease progression. Pulmonary function tests are mostly stable, with one parameter slightly low but not concerning. IPF is known to progress over 8-10 years, often with periods of stability followed by sudden declines in lung function. Currently, lung function is good, and potential declines may not be immediately noticeable. - Order high-resolution CT scan in April next year. - Monitor for symptoms such as persistent cough  or shortness of breath, which could indicate progression or complications like infections. - Advise to get a flu shot, preferably by early October. - Allow primary care physician to manage  acute respiratory episodes, with follow-up if symptoms persist.

## 2024-06-08 DIAGNOSIS — Z23 Encounter for immunization: Secondary | ICD-10-CM | POA: Diagnosis not present

## 2024-06-17 ENCOUNTER — Other Ambulatory Visit: Payer: Self-pay | Admitting: Internal Medicine

## 2024-06-17 DIAGNOSIS — K219 Gastro-esophageal reflux disease without esophagitis: Secondary | ICD-10-CM

## 2024-06-17 DIAGNOSIS — I10 Essential (primary) hypertension: Secondary | ICD-10-CM

## 2024-06-18 ENCOUNTER — Telehealth: Payer: Self-pay

## 2024-06-18 NOTE — Telephone Encounter (Signed)
 Copied from CRM [id number]. Topic: Clinical - Prescription Issue >> Jun 18, 2024  9:33 AM Gustabo D wrote:  amLODIPine  Besylate 5 MG TAKE 1 TABLET(5 MG) BY MOUTH DAILY  Pantoprazole  Sodium 40 MG TAKE 1 TABLET(40 MG) BY MOUTH TWICE DAILY PT was told by pharmacy her prescriptions were refused

## 2024-06-21 ENCOUNTER — Other Ambulatory Visit: Payer: Self-pay | Admitting: Internal Medicine

## 2024-06-21 ENCOUNTER — Encounter: Payer: Self-pay | Admitting: Internal Medicine

## 2024-06-21 DIAGNOSIS — K121 Other forms of stomatitis: Secondary | ICD-10-CM

## 2024-06-21 DIAGNOSIS — E782 Mixed hyperlipidemia: Secondary | ICD-10-CM

## 2024-06-21 DIAGNOSIS — I10 Essential (primary) hypertension: Secondary | ICD-10-CM

## 2024-06-21 NOTE — Telephone Encounter (Signed)
 Spoke to pt states she spoke to pharmacist and they told her they had been approved, she is unsure if they are getting medication ready or if they were out of stock, will call the pharmacy back to find out and will let us  know if prior authorization is needed.

## 2024-06-23 DIAGNOSIS — I10 Essential (primary) hypertension: Secondary | ICD-10-CM | POA: Diagnosis not present

## 2024-06-23 DIAGNOSIS — E782 Mixed hyperlipidemia: Secondary | ICD-10-CM | POA: Diagnosis not present

## 2024-06-23 DIAGNOSIS — K121 Other forms of stomatitis: Secondary | ICD-10-CM | POA: Diagnosis not present

## 2024-06-24 ENCOUNTER — Ambulatory Visit: Payer: Self-pay | Admitting: Internal Medicine

## 2024-06-24 LAB — CBC
Hematocrit: 48.4 % — ABNORMAL HIGH (ref 34.0–46.6)
Hemoglobin: 15.4 g/dL (ref 11.1–15.9)
MCH: 26.1 pg — ABNORMAL LOW (ref 26.6–33.0)
MCHC: 31.8 g/dL (ref 31.5–35.7)
MCV: 82 fL (ref 79–97)
Platelets: 247 x10E3/uL (ref 150–450)
RBC: 5.89 x10E6/uL — ABNORMAL HIGH (ref 3.77–5.28)
RDW: 15.3 % (ref 11.7–15.4)
WBC: 6.1 x10E3/uL (ref 3.4–10.8)

## 2024-06-24 LAB — LIPID PANEL
Chol/HDL Ratio: 5.8 ratio — ABNORMAL HIGH (ref 0.0–4.4)
Cholesterol, Total: 192 mg/dL (ref 100–199)
HDL: 33 mg/dL — ABNORMAL LOW (ref >39)
LDL Chol Calc (NIH): 129 mg/dL — ABNORMAL HIGH (ref 0–99)
Triglycerides: 168 mg/dL — ABNORMAL HIGH (ref 0–149)
VLDL Cholesterol Cal: 30 mg/dL (ref 5–40)

## 2024-06-24 LAB — HSV 1 ANTIBODY, IGG: HSV 1 Glycoprotein G Ab, IgG: REACTIVE — AB

## 2024-07-02 ENCOUNTER — Other Ambulatory Visit (HOSPITAL_COMMUNITY): Payer: Self-pay | Admitting: Internal Medicine

## 2024-07-02 DIAGNOSIS — Z1231 Encounter for screening mammogram for malignant neoplasm of breast: Secondary | ICD-10-CM

## 2024-07-12 ENCOUNTER — Ambulatory Visit (HOSPITAL_COMMUNITY)
Admission: RE | Admit: 2024-07-12 | Discharge: 2024-07-12 | Disposition: A | Discharge status: Home / Self Care | Source: Ambulatory Visit | Attending: Internal Medicine | Admitting: Internal Medicine

## 2024-07-12 DIAGNOSIS — Z1231 Encounter for screening mammogram for malignant neoplasm of breast: Secondary | ICD-10-CM | POA: Diagnosis not present

## 2024-07-14 ENCOUNTER — Encounter (INDEPENDENT_AMBULATORY_CARE_PROVIDER_SITE_OTHER): Payer: Self-pay | Admitting: Gastroenterology

## 2024-07-19 ENCOUNTER — Ambulatory Visit (HOSPITAL_COMMUNITY)
Admission: RE | Admit: 2024-07-19 | Discharge: 2024-07-19 | Disposition: A | Discharge status: Home / Self Care | Source: Ambulatory Visit | Attending: Pulmonary Disease | Admitting: Pulmonary Disease

## 2024-07-19 DIAGNOSIS — J849 Interstitial pulmonary disease, unspecified: Secondary | ICD-10-CM | POA: Diagnosis not present

## 2024-07-19 DIAGNOSIS — J432 Centrilobular emphysema: Secondary | ICD-10-CM | POA: Diagnosis not present

## 2024-07-19 DIAGNOSIS — R918 Other nonspecific abnormal finding of lung field: Secondary | ICD-10-CM | POA: Diagnosis not present

## 2024-07-21 ENCOUNTER — Ambulatory Visit: Admitting: Nurse Practitioner

## 2024-07-22 ENCOUNTER — Ambulatory Visit: Payer: Self-pay | Admitting: Pulmonary Disease

## 2024-07-22 NOTE — Telephone Encounter (Signed)
Pt needs to schedule follow up.

## 2024-08-02 ENCOUNTER — Ambulatory Visit (INDEPENDENT_AMBULATORY_CARE_PROVIDER_SITE_OTHER): Admitting: Nurse Practitioner

## 2024-08-02 ENCOUNTER — Encounter: Payer: Self-pay | Admitting: Nurse Practitioner

## 2024-08-02 VITALS — BP 116/74 | HR 80 | Ht 66.0 in | Wt 152.8 lb

## 2024-08-02 DIAGNOSIS — E1165 Type 2 diabetes mellitus with hyperglycemia: Secondary | ICD-10-CM

## 2024-08-02 DIAGNOSIS — E559 Vitamin D deficiency, unspecified: Secondary | ICD-10-CM

## 2024-08-02 DIAGNOSIS — Z794 Long term (current) use of insulin: Secondary | ICD-10-CM

## 2024-08-02 DIAGNOSIS — E039 Hypothyroidism, unspecified: Secondary | ICD-10-CM | POA: Diagnosis not present

## 2024-08-02 DIAGNOSIS — E119 Type 2 diabetes mellitus without complications: Secondary | ICD-10-CM

## 2024-08-02 DIAGNOSIS — Z7984 Long term (current) use of oral hypoglycemic drugs: Secondary | ICD-10-CM

## 2024-08-02 DIAGNOSIS — R5382 Chronic fatigue, unspecified: Secondary | ICD-10-CM

## 2024-08-02 DIAGNOSIS — D351 Benign neoplasm of parathyroid gland: Secondary | ICD-10-CM

## 2024-08-02 LAB — POCT GLYCOSYLATED HEMOGLOBIN (HGB A1C): Hemoglobin A1C: 8 % — AB (ref 4.0–5.6)

## 2024-08-02 MED ORDER — GLIPIZIDE ER 2.5 MG PO TB24: 2.5000 mg | ORAL_TABLET | Freq: Every day | ORAL | 3 refills | Status: AC

## 2024-08-02 MED ORDER — INSULIN GLARGINE 100 UNITS/ML SOLOSTAR PEN: 40.0000 [IU] | PEN_INJECTOR | Freq: Every day | SUBCUTANEOUS | 3 refills | Status: AC

## 2024-08-02 NOTE — Progress Notes (Signed)
 Endocrinology Follow Up Note                                         08/02/2024, 10:52 AM  Subjective:   Subjective    [name] is a 73 y.o.-year-old female patient being seen in follow up after being seen in consultation for hypothyroidism, diabetes, hx parathyroid  adenoma referred by Tobie Suzzane POUR, MD.   Past Medical History:  Diagnosis Date   Arthritis    Asthma    takes Singulair  nightly   Diabetes mellitus    takes levemir  daily and Metformin  daily   Generalized headaches    GERD (gastroesophageal reflux disease)    takes Omeprazole  daily   Herniated disc, cervical    History of kidney stones    Hyperlipidemia    takes Welchol  daily   Hypertension    takes Enalapril  dialy   Hypothyroidism    takes Synthroid  daily   IBS (irritable bowel syndrome)    IPF (idiopathic pulmonary fibrosis) (HCC) 2024   Joint pain    hands and neck   Nocturia    PONV (postoperative nausea and vomiting)    TIA (transient ischemic attack)    diagnosed by EEG but never knew she had it   Trigger finger    Weakness    Weakness    in both hands    Past Surgical History:  Procedure Laterality Date   ABDOMINAL HYSTERECTOMY  1980   ANTERIOR CERVICAL DECOMP/DISCECTOMY FUSION N/A 10/18/2013   Procedure: Cervical three-four, Cervical four-five, Cervical seven-Thoracic one anterior cervical decompression with fusion plating and bonegraft;  Surgeon: Lamar LELON Peaches, MD;  Location: [facility];  Service: Neurosurgery;  Laterality: N/A;  Cervical three-four, Cervical four-five, Cervical seven-Thoracic one anterior cervical decompression with fusion plating and bonegraft   APPENDECTOMY     done with hysterectomy   BACK SURGERY  2007   lumbar fusion   BIOPSY  03/19/2023   Procedure: BIOPSY;  Surgeon: Eartha Flavors, Toribio, MD;  Location: [facility];  Service: Gastroenterology;;   carpel tunnel Bilateral    left  first then right   cataract implants     CHOLECYSTECTOMY N/A 11/22/2021   Procedure: LAPAROSCOPIC CHOLECYSTECTOMY;  Surgeon: Evonnie Dorothyann LABOR, DO;  Location: [facility];  Service: General;  Laterality: N/A;   COLONOSCOPY     COLONOSCOPY WITH PROPOFOL  N/A 11/27/2022   Procedure: COLONOSCOPY WITH PROPOFOL ;  Surgeon: Eartha Flavors Toribio, MD;  Location: [facility];  Service: Gastroenterology;  Laterality: N/A;  10:00am, asa 1-2   CYSTOSCOPY WITH RETROGRADE PYELOGRAM, URETEROSCOPY AND STENT PLACEMENT Left 04/08/2022   Procedure: CYSTOSCOPY WITH RETROGRADE PYELOGRAM, URETEROSCOPY AND STENT PLACEMENT;  Surgeon: Matilda Senior, MD;  Location: [facility];  Service: Urology;  Laterality: Left;   ESOPHAGOGASTRODUODENOSCOPY     hx of h pylori   ESOPHAGOGASTRODUODENOSCOPY (EGD) WITH PROPOFOL  N/A 03/19/2023   Procedure: ESOPHAGOGASTRODUODENOSCOPY (EGD) WITH PROPOFOL ;  Surgeon: Eartha Flavors Toribio, MD;  Location: [facility];  Service: Gastroenterology;  Laterality: N/A;  10:30am;asa 2   EYE SURGERY  11/18/2008   right - cataracts   EYE SURGERY  06/01/2009   left - cataracts   herniated disc   10/2000, 08/31/2008   C5-C6 then C6-C7   HOLMIUM LASER APPLICATION Left 04/08/2022   Procedure: HOLMIUM LASER APPLICATION;  Surgeon: Matilda Senior, MD;  Location: [facility];  Service: Urology;  Laterality: Left;   IR URETERAL STENT RIGHT NEW ACCESS W/O SEP NEPHROSTOMY CATH  07/26/2021   knee anthroscopy bilateral Bilateral    KNEE ARTHROSCOPY     LITHOTRIPSY  11/12/2007   right side   NEPHROLITHOTOMY Right 07/26/2021   Procedure: NEPHROLITHOTOMY PERCUTANEOUS;  Surgeon: Matilda Senior, MD;  Location: [facility];  Service: Urology;  Laterality: Right;   Parathyroid  surgery     07-15-2012  PARATHYROIDECTOMY  07/02/2012   Procedure: PARATHYROIDECTOMY;  Surgeon: Krystal CHRISTELLA Spinner, MD;  Location: [facility];  Service: General;  Laterality: N/A;   TONSILLECTOMY     as a  child    Social History   Socioeconomic History   Marital status: Widowed    Spouse name: Not on file   Number of children: Not on file   Years of education: Not on file   Highest education level: GED or equivalent  Occupational History   Not on file  Tobacco Use   Smoking status: Former    Current packs/day: 0.00    Average packs/day: 1 pack/day for 40.0 years (40.0 ttl pk-yrs)    Types: Cigarettes    Start date: 07/19/1972    Quit date: 07/19/2012    Years since quitting: 12.0    Passive exposure: Past   Smokeless tobacco: Never   Tobacco comments:    quit smoking in July 15, 2012 Vaping Use   Vaping status: Never Used  Substance and Sexual Activity   Alcohol use: No    Alcohol/week: 0.0 standard drinks of alcohol   Drug use: No   Sexual activity: Not Currently    Birth control/protection: Surgical  Other Topics Concern   Not on file  Social History Narrative   Widow,husband died Oct 16, 2020after 35 years marriage.Retired.   Social Drivers of Corporate Investment Banker Strain: Low Risk  (03/05/2024)   Overall Financial Resource Strain (CARDIA)    Difficulty of Paying Living Expenses: Not hard at all  Food Insecurity: No Food Insecurity (03/05/2024)   Hunger Vital Sign    Worried About Running Out of Food in the Last Year: Never true    Ran Out of Food in the Last Year: Never true  Transportation Needs: No Transportation Needs (03/05/2024)   PRAPARE - Administrator, Civil Service (Medical): No    Lack of Transportation (Non-Medical): No  Physical Activity: Sufficiently Active (03/05/2024)   Exercise Vital Sign    Days of Exercise per Week: 3 days    Minutes of Exercise per Session: 100 min  Stress: No Stress Concern Present (03/05/2024)   [employer] of Occupational Health - Occupational Stress Questionnaire    Feeling of Stress : Only a little  Social Connections: Moderately Integrated (03/05/2024)   Social Connection and Isolation Panel    Frequency  of Communication with Friends and Family: Three times a week    Frequency of Social Gatherings with Friends and Family: Twice a week    Attends Religious Services: More than 4 times per year    Active Member of Clubs or Organizations: Yes  Attends Banker Meetings: More than 4 times per year    Marital Status: Widowed    Family History  Problem Relation Age of Onset   Cancer Mother        pancreatic    Outpatient Encounter Medications as of 08/02/2024  Medication Sig   Accu-Chek Softclix Lancets lancets Use as instructed to monitor glucose twice daily.   acetaminophen  (TYLENOL ) 500 MG tablet Take 500 mg by mouth every 6 (six) hours as needed.   albuterol  (VENTOLIN  HFA) 108 (90 Base) MCG/ACT inhaler Inhale 2 puffs into the lungs every 6 (six) hours as needed for wheezing or shortness of breath.   amLODipine  (NORVASC ) 5 MG tablet TAKE 1 TABLET(5 MG) BY MOUTH DAILY   blood glucose meter kit and supplies KIT Dispense based on patient and insurance preference. Use twice daily (before breakfast and before bed).   fluticasone  (FLONASE ) 50 MCG/ACT nasal spray Place 2 sprays into both nostrils daily.   glucose blood (ACCU-CHEK GUIDE TEST) test strip Use as instructed to monitor glucose twice daily.   Insulin  Pen Needle (PEN NEEDLES) 31G X 6 MM MISC Use to inject insulin  once daily   metoprolol  tartrate (LOPRESSOR ) 25 MG tablet TAKE 1 TABLET(25 MG) BY MOUTH TWICE DAILY   pantoprazole  (PROTONIX ) 40 MG tablet TAKE 1 TABLET(40 MG) BY MOUTH TWICE DAILY   thyroid  (NP THYROID ) 60 MG tablet TAKE 1 TABLET BY MOUTH EVERY DAY BEFORE BREAKFAST   Vitamin D , Ergocalciferol , (DRISDOL ) 1.25 MG (50000 UNIT) CAPS capsule Take 1 capsule (50,000 Units total) by mouth every 7 (seven) days.   [DISCONTINUED] glipiZIDE  (GLUCOTROL  XL) 2.5 MG 24 hr tablet TAKE 1 TABLET(2.5 MG) BY MOUTH DAILY WITH BREAKFAST   [DISCONTINUED] insulin  glargine (LANTUS ) 100 unit/mL SOPN Inject 30 Units into the skin at bedtime.    glipiZIDE  (GLUCOTROL  XL) 2.5 MG 24 hr tablet Take 1 tablet (2.5 mg total) by mouth daily with breakfast.   insulin  glargine (LANTUS ) 100 unit/mL SOPN Inject 40 Units into the skin at bedtime.   valACYclovir  (VALTREX ) 1000 MG tablet TAKE 1 TABLET BY MOUTH TWICE DAILY (Patient not taking: Reported on 08/02/2024)   No facility-administered encounter medications on file as of 08/02/2024.    ALLERGIES: Allergies  Allergen Reactions   Advair Diskus [Fluticasone -Salmeterol] Anaphylaxis   Codeine Nausea And Vomiting   Hydrocodone  Nausea And Vomiting   Levothyroxine  Other (See Comments)    Caused lower leg swelling   Oxycodone  Nausea And Vomiting   Statins     Muscle and joint pain   Tetracyclines & Related Itching    Internal   VACCINATION STATUS: Immunization History  Administered Date(s) Administered   Fluad Quad(high Dose 65+) 08/17/2019, 07/29/2021   INFLUENZA, HIGH DOSE SEASONAL PF 07/01/2017, 07/14/2020   Influenza Split 07/31/2013   Influenza-Unspecified 07/09/2017, 08/09/2022, 07/01/2023   Moderna Sars-Covid-2 Vaccination 11/24/2019, 12/22/2019, 08/30/2020, 02/28/2021   Tdap 07/12/2014     Diabetes She presents for her follow-up diabetic visit. She has type 2 diabetes mellitus. Her disease course has been worsening. Associated symptoms include fatigue. There are no hypoglycemic complications. Symptoms are stable. There are no diabetic complications. Risk factors for coronary artery disease include diabetes mellitus, stress, post-menopausal and hypertension. Current diabetic treatment includes insulin  injections and oral agent (monotherapy). She is compliant with treatment most of the time. Her weight is fluctuating minimally. She is following a generally healthy diet. Meal planning includes avoidance of concentrated sweets. She has not had a previous visit with a dietitian. She participates in  exercise intermittently. Her home blood glucose trend is increasing steadily. Her  breakfast blood glucose range is generally 140-180 mg/dl. (She presents today with her meter and logs showing above target glycemic profile overall.  Her POCT A1c today is 8%, increasing from last visit of 7.2%.   Analysis of her meter shows 7-day average of 172, 14-day average of 174, 30-day average of 178, 90-day average of 156.  She has been fighting with allergies recently.) An ACE inhibitor/angiotensin II receptor blocker is not being taken. She does not see a podiatrist.Eye exam is current.  Thyroid  Problem Presents for follow-up visit. Symptoms include depressed mood and fatigue. Patient reports no constipation or weight gain. The symptoms have been stable.    [name] is a patient with the above medical history. 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 Krystal Spinner.  She is now experiencing similar symptoms that she had at that time and would like to investigate whether or not there could be a problem with one of her other parathyroid  glands.  She thinks she remembers being started on thyroid  hormone replacement after her parathyroid  removal due to possible injury to the thyroid  gland during its removal.  She was a patient of Dr. Caswell who had started the patient on NP thyroid  likely as a result of accidental injury to the thyroid  tissue during parathyroidectomy surgery   she was given various doses of NP thyroid  over the years, currently on 60 mg. she reports compliance to this medication:  Taking it daily on empty stomach  with water , separated by >30 minutes before breakfast and other medications, and by at least 4 hours from calcium, iron, PPIs, multivitamins .  I reviewed patient's thyroid  tests:  Lab Results  Component Value Date   TSH 1.650 03/09/2024   TSH 1.100 12/08/2023   TSH 2.890 06/03/2023   TSH 2.920 10/21/2022   TSH 2.300 01/23/2022   TSH 2.120 10/22/2021   TSH 2.930 07/03/2021   TSH 0.57 10/14/2019   TSH 0.15  (L) 06/29/2019   TSH 2.39 06/23/2018   FREET4 1.17 03/09/2024   FREET4 1.18 12/08/2023   FREET4 1.11 06/03/2023   FREET4 1.13 10/21/2022   FREET4 1.26 01/23/2022   FREET4 1.26 10/22/2021   FREET4 1.23 07/03/2021   FREET4 1.4 06/23/2018   FREET4 1.4 04/16/2017   FREET4 1.5 10/14/2016    Pt denies feeling nodules in neck, hoarseness,  SOB with lying down.  She does report intermittent trouble swallowing pills, something new.  she denies family history of thyroid  disorders.  No family history of thyroid  cancer.  No history of radiation therapy to head or neck.  No recent use of iodine supplements.  Denies use of Biotin containing supplements.  I reviewed her chart and she also has a history of parathyroid  adenoma, controlled Diabetes without medications, GERD, HTN.   Review of systems  Constitutional: + Minimally fluctuating body weight,  current Body mass index is 24.66 kg/m. , + fatigue, no subjective hyperthermia, no subjective hypothermia Eyes: no blurry vision, no xerophthalmia ENT: no sore throat, no nodules palpated in throat, no dysphagia/odynophagia, no hoarseness Cardiovascular: no chest pain, no shortness of breath, no palpitations, no leg swelling Respiratory: no cough, no shortness of breath Gastrointestinal: no nausea/vomiting/diarrhea Musculoskeletal: no muscle/joint aches Skin: no rashes, no hyperemia Neurological: no tremors, + numbness/ tingling to hands and feet, no dizziness Psychiatric: no depression, no anxiety, + insomnia   Objective:   Objective  BP 116/74 (BP Location: Left Arm, Patient Position: Sitting, Cuff Size: Large)   Pulse 80   Ht 5' 6 (1.676 m)   Wt 152 lb 12.8 oz (69.3 kg)   BMI 24.66 kg/m  Wt Readings from Last 3 Encounters:  08/02/24 152 lb 12.8 oz (69.3 kg)  06/03/24 150 lb (68 kg)  03/19/24 150 lb 3.2 oz (68.1 kg)    BP Readings from Last 3 Encounters:  08/02/24 116/74  06/03/24 132/73  03/19/24 102/70       Physical  Exam- Limited  Constitutional:  Body mass index is 24.66 kg/m. , not in acute distress, normal state of mind Eyes:  EOMI, no exophthalmos Musculoskeletal: no gross deformities, strength intact in all four extremities, no gross restriction of joint movements Skin:  no rashes, no hyperemia Neurological: no tremor with outstretched hands   Diabetic Foot Exam - Simple   No data filed     CMP ( most recent) CMP     Component Value Date/Time   NA 140 03/09/2024 0929   K 4.7 03/09/2024 0929   CL 104 03/09/2024 0929   CO2 20 03/09/2024 0929   GLUCOSE 143 (H) 03/09/2024 0929   GLUCOSE 211 (H) 04/08/2022 0713   BUN 20 03/09/2024 0929   CREATININE 0.90 03/09/2024 0929   CREATININE 0.81 12/25/2020 1034   CALCIUM 9.0 03/09/2024 0929   PROT 6.3 03/09/2024 0929   ALBUMIN 4.3 03/09/2024 0929   AST 19 03/09/2024 0929   ALT 21 03/09/2024 0929   ALKPHOS 82 03/09/2024 0929   BILITOT 0.4 03/09/2024 0929   GFRNONAA >60 11/20/2021 0916   GFRNONAA 73 12/25/2020 1034   GFRAA 85 12/25/2020 1034     Diabetic Labs (most recent): Lab Results  Component Value Date   HGBA1C 8.0 (A) 08/02/2024   HGBA1C 7.2 (A) 03/19/2024   HGBA1C 7.1 (A) 12/15/2023   MICROALBUR 30mg /l 06/25/2022   MICROALBUR 30 07/10/2021   MICROALBUR 0.3 02/18/2018     Lipid Panel ( most recent) Lipid Panel     Component Value Date/Time   CHOL 192 06/23/2024 1020   TRIG 168 (H) 06/23/2024 1020   HDL 33 (L) 06/23/2024 1020   CHOLHDL 5.8 (H) 06/23/2024 1020   CHOLHDL 3.9 12/25/2020 1034   VLDL 33 (H) 10/14/2016 0721   LDLCALC 129 (H) 06/23/2024 1020   LDLCALC 85 12/25/2020 1034   LABVLDL 30 06/23/2024 1020       Lab Results  Component Value Date   TSH 1.650 03/09/2024   TSH 1.100 12/08/2023   TSH 2.890 06/03/2023   TSH 2.920 10/21/2022   TSH 2.300 01/23/2022   TSH 2.120 10/22/2021   TSH 2.930 07/03/2021   TSH 0.57 10/14/2019   TSH 0.15 (L) 06/29/2019   TSH 2.39 06/23/2018   FREET4 1.17 03/09/2024    FREET4 1.18 12/08/2023   FREET4 1.11 06/03/2023   FREET4 1.13 10/21/2022   FREET4 1.26 01/23/2022   FREET4 1.26 10/22/2021   FREET4 1.23 07/03/2021   FREET4 1.4 06/23/2018   FREET4 1.4 04/16/2017   FREET4 1.5 10/14/2016    US  Thyroid  06/26/21 CLINICAL DATA:  History of parathyroid  adenoma.   EXAM: THYROID  ULTRASOUND   TECHNIQUE: Ultrasound examination of the thyroid  gland and adjacent soft tissues was performed.   COMPARISON:  04/23/2011   FINDINGS: Parenchymal Echotexture: Mildly heterogenous   Isthmus: 0.1 cm, previously 0.2 cm   Right lobe: 4.6 x 1.9 x 1.4 cm, previously 4.1 x 1.4 x 1.4 cm   Left  lobe: 3.1 x 1.0 x 1.1 cm, previously 3.9 x 1.5 x 1.3 cm   _________________________________________________________   Estimated total number of nodules >/= 1 cm: 0   Number of spongiform nodules >/=  2 cm not described below (TR1): 0   Number of mixed cystic and solid nodules >/= 1.5 cm not described below (TR2): 0   _________________________________________________________   No discrete nodules. Previously there was a nodule in the left thyroid  lobe that is no longer present.   IMPRESSION: Thyroid  tissue is mildly heterogeneous without discrete thyroid  nodules.     Electronically Signed   By: Juliene Balder M.D.   On: 06/26/2021 11:17    Latest Reference Range & Units 10/21/22 08:37 06/03/23 08:27 12/08/23 08:16 03/09/24 09:29  TSH 0.450 - 4.500 uIU/mL 2.920 2.890 1.100 1.650  Triiodothyronine,Free,Serum 2.0 - 4.4 pg/mL   4.7 (H) 4.9 (H)  T4,Free(Direct) 0.82 - 1.77 ng/dL 8.86 8.88 8.81 8.82  (H): Data is abnormally high    Assessment & Plan:   ASSESSMENT / PLAN:  1. History of parathyroid  adenoma- s/p parathyroidectomy  -Her recent calcium levels from 03/09/24 were normal at 9.  She does have low vitamin D  currently on replacement.   2. Hypothyroidism- unspecified  Patient with long-standing hypothyroidism, on NP thyroid  60 mg.  She historically does not  tolerate Levothyroxine  (causes significant ankle swelling).  -There are no recent TFTs to review.  She is advised to continue NP thyroid  60 mg po daily before breakfast.  Will recheck TFTs prior to next visit and adjust dose accordingly.  - We discussed about correct intake of levothyroxine , at fasting, with water , separated by at least 30 minutes from breakfast, and separated by more than 4 hours from calcium, iron, multivitamins, acid reflux medications (PPIs). -Patient is made aware of the fact that thyroid  hormone replacement is needed for life, dose to be adjusted by periodic monitoring of thyroid  function tests.  3. Uncontrolled Diabetes  She presents today with her meter and logs showing above target glycemic profile overall.  Her POCT A1c today is 8%, increasing from last visit of 7.2%.   Analysis of her meter shows 7-day average of 172, 14-day average of 174, 30-day average of 178, 90-day average of 156.  She has been fighting with allergies recently.  She is advised to increase Lantus  to 40 units SQ nightly and continue Glipizide  2.5 mg XL daily with breakfast.    She is advised to check glucose 1-2 times daily, before breakfast and before bed, and to call the clinic if she has readings less than 70 or above 300 for 3 tests in a row.  She did not care for the CGM, prefers to do traditional fingersticks.  - Nutritional counseling repeated/built upon at each appointment.  - The patient admits there is a room for improvement in their diet and drink choices. -  Suggestion is made for the patient to avoid simple carbohydrates from their diet including Cakes, Sweet Desserts / Pastries, Ice Cream, Soda (diet and regular), Sweet Tea, Candies, Chips, Cookies, Sweet Pastries, Store Bought Juices, Alcohol in Excess of 1-2 drinks a day, Artificial Sweeteners, Coffee Creamer, and Sugar-free Products. This will help patient to have stable blood glucose profile and potentially avoid unintended weight  gain.   - I encouraged the patient to switch to unprocessed or minimally processed complex starch and increased protein intake (animal or plant source), fruits, and vegetables.   - Patient is advised to stick to a routine mealtimes to eat  3 meals a day and avoid unnecessary snacks (to snack only to correct hypoglycemia).  4. Vitamin D  deficiency Her most recent vitamin D  level was 51.2 on 10/21/22.  She reportedly does not tolerate OTC vitamin D , causes upset stomach and diarrhea.  She is on Ergocalciferol  50000 units PO weekly which she is tolerating well.  Will recheck Vitamin D  prior to next visit.  Will also check magneisum and B12 as well as she has been taking B12 supplements recently.     I spent  30  minutes in the care of the patient today including review of labs from CMP, Lipids, Thyroid  Function, Hematology (current and previous including abstractions from other facilities); face-to-face time discussing  her blood glucose readings/logs, discussing hypoglycemia and hyperglycemia episodes and symptoms, medications doses, her options of short and long term treatment based on the latest standards of care / guidelines;  discussion about incorporating lifestyle medicine;  and documenting the encounter. Risk reduction counseling performed per USPSTF guidelines to reduce obesity and cardiovascular risk factors.     Please refer to Patient Instructions for Blood Glucose Monitoring and Insulin /Medications Dosing Guide  in media tab for additional information. Please  also refer to  Patient Self Inventory in the Media  tab for reviewed elements of pertinent patient history.  Krisanne S Collinsworth participated in the discussions, expressed understanding, and voiced agreement with the above plans.  All questions were answered to her satisfaction. she is encouraged to contact clinic should she have any questions or concerns prior to her return visit.    FOLLOW UP PLAN:  Return in about 4 months  (around 11/30/2024) for Diabetes F/U with A1c in office, Previsit labs, Thyroid  follow up, Bring meter and logs.   [place] Rio, [facility] [address] [place], [place] [address] Phone: [phone] Fax: [phone]  08/02/2024, 10:52 AM

## 2024-08-10 DIAGNOSIS — L280 Lichen simplex chronicus: Secondary | ICD-10-CM | POA: Diagnosis not present

## 2024-08-21 ENCOUNTER — Encounter: Payer: Self-pay | Admitting: Internal Medicine

## 2024-08-31 ENCOUNTER — Other Ambulatory Visit: Payer: Self-pay | Admitting: *Deleted

## 2024-08-31 DIAGNOSIS — E119 Type 2 diabetes mellitus without complications: Secondary | ICD-10-CM

## 2024-08-31 DIAGNOSIS — Z7984 Long term (current) use of oral hypoglycemic drugs: Secondary | ICD-10-CM

## 2024-08-31 MED ORDER — ACCU-CHEK SOFTCLIX LANCETS MISC: 2 refills | Status: AC

## 2024-09-08 ENCOUNTER — Ambulatory Visit: Admitting: Internal Medicine

## 2024-09-20 ENCOUNTER — Other Ambulatory Visit: Payer: Self-pay | Admitting: Internal Medicine

## 2024-09-20 DIAGNOSIS — I10 Essential (primary) hypertension: Secondary | ICD-10-CM

## 2024-10-05 ENCOUNTER — Ambulatory Visit (INDEPENDENT_AMBULATORY_CARE_PROVIDER_SITE_OTHER)

## 2024-10-05 VITALS — Ht 66.0 in | Wt 152.0 lb

## 2024-10-05 DIAGNOSIS — Z0000 Encounter for general adult medical examination without abnormal findings: Secondary | ICD-10-CM

## 2024-10-05 DIAGNOSIS — Z780 Asymptomatic menopausal state: Secondary | ICD-10-CM

## 2024-10-05 NOTE — Patient Instructions (Signed)
 Ms. Robben,  Thank you for taking the time for your Medicare Wellness Visit. I appreciate your continued commitment to your health goals. Please review the care plan we discussed, and feel free to reach out if I can assist you further.  Please note that Annual Wellness Visits do not include a physical exam. Some assessments may be limited, especially if the visit was conducted virtually. If needed, we may recommend an in-person follow-up with your provider.  Ongoing Care Seeing your primary care provider every 3 to 6 months helps us  monitor your health and provide consistent, personalized care.   1 year follow up for Medicare well visit: October 07, 2025 at 10:00 am with medicare wellness nurse in office  Referrals If a referral was made during today's visit and you haven't received any updates within two weeks, please contact the referred provider directly to check on the status.  Osteoporosis Screening An order was placed for you to have your Osteoporosis Screening. Call the number below to schedule that [facility]  [phone]   Recommended Screenings:  Health Maintenance  Topic Date Due   Pneumococcal Vaccine for age over 35 (1 of 2 - PCV) Never done   Zoster (Shingles) Vaccine (1 of 2) Never done   Osteoporosis screening with Bone Density Scan  06/29/2019   Medicare Annual Wellness Visit  03/06/2024   COVID-19 Vaccine (5 - 2025-26 season) 05/31/2024   DTaP/Tdap/Td vaccine (2 - Td or Tdap) 07/12/2024   Yearly kidney health urinalysis for diabetes  09/01/2024   Eye exam for diabetics  01/27/2025   Hemoglobin A1C  01/30/2025   Yearly kidney function blood test for diabetes  03/09/2025   Complete foot exam   03/09/2025   Breast Cancer Screening  07/12/2025   Screening for Lung Cancer  07/19/2025   Colon Cancer Screening  03/18/2028   Flu Shot  Completed   Hepatitis C Screening  Completed   Meningitis B Vaccine  Aged Out   Cologuard (Stool DNA test)  Discontinued        10/02/2024    8:59 AM  Advanced Directives  Does Patient Have a Medical Advance Directive? Yes  Type of Estate Agent of [place];Living will  Copy of Healthcare Power of Attorney in Chart? Yes - validated most recent copy scanned in chart (See row information)    Vision: Annual vision screenings are recommended for early detection of glaucoma, cataracts, and diabetic retinopathy. These exams can also reveal signs of chronic conditions such as diabetes and high blood pressure.  Dental: Annual dental screenings help detect early signs of oral cancer, gum disease, and other conditions linked to overall health, including heart disease and diabetes.  Please see the attached documents for additional preventive care recommendations.

## 2024-10-05 NOTE — Progress Notes (Signed)
 "   Chief Complaint  Patient presents with   Medicare Wellness     Subjective:   [name] is a 74 y.o. female who presents for a Medicare Annual Wellness Visit.  Visit info / Clinical Intake: Medicare Wellness Visit Type:: Subsequent Annual Wellness Visit Persons participating in visit and providing information:: patient Medicare Wellness Visit Mode:: Telephone If telephone:: video error Since this visit was completed virtually, some vitals may be partially provided or unavailable. Missing vitals are due to the limitations of the virtual format.: Documented vitals are patient reported If Telephone or Video please confirm:: I connected with patient using audio/video enable telemedicine. I verified patient identity with two identifiers, discussed telehealth limitations, and patient agreed to proceed. Patient Location:: home Provider Location:: office Interpreter Needed?: No Pre-visit prep was completed: yes AWV questionnaire completed by patient prior to visit?: yes Date:: 10/02/24 Living arrangements:: (!) (Patient-Rptd) lives alone Patient's Overall Health Status Rating: (!) (Patient-Rptd) fair Typical amount of pain: (Patient-Rptd) some Does pain affect daily life?: (!) (Patient-Rptd) yes Are you currently prescribed opioids?: no  Dietary Habits and Nutritional Risks How many meals a day?: (Patient-Rptd) 2 Eats fruit and vegetables daily?: (!) (Patient-Rptd) no Most meals are obtained by: (Patient-Rptd) preparing own meals In the last 2 weeks, have you had any of the following?: none Diabetic:: (!) yes Any non-healing wounds?: no How often do you check your BS?: 1 Would you like to be referred to a Nutritionist or for Diabetic Management? : no  Functional Status Activities of Daily Living (to include ambulation/medication): (Patient-Rptd) Independent Ambulation: (Patient-Rptd) Independent Medication Administration: (Patient-Rptd) Independent Home Management (perform  basic housework or laundry): (Patient-Rptd) Independent Manage your own finances?: (Patient-Rptd) yes Primary transportation is: (Patient-Rptd) driving Concerns about vision?: no *vision screening is required for WTM* Concerns about hearing?: no  Fall Screening Falls in the past year?: (Patient-Rptd) 1 Number of falls in past year: (Patient-Rptd) 0 Was there an injury with Fall?: (Patient-Rptd) 0 Fall Risk Category Calculator: (Patient-Rptd) 1 Patient Fall Risk Level: (Patient-Rptd) Low Fall Risk  Fall Risk Patient at Risk for Falls Due to: No Fall Risks Fall risk Follow up: Falls evaluation completed; Education provided; Falls prevention discussed  Home and Transportation Safety: All rugs have non-skid backing?: (Patient-Rptd) N/A, no rugs All stairs or steps have railings?: (Patient-Rptd) yes Grab bars in the bathtub or shower?: (Patient-Rptd) yes Have non-skid surface in bathtub or shower?: (Patient-Rptd) yes Good home lighting?: (Patient-Rptd) yes Regular seat belt use?: (Patient-Rptd) yes Hospital stays in the last year:: (Patient-Rptd) no  Cognitive Assessment Difficulty concentrating, remembering, or making decisions? : (Patient-Rptd) no Will 6CIT or Mini Cog be Completed: yes What year is it?: 0 points What month is it?: 0 points Give patient an address phrase to remember (5 components): [address] [place] About what time is it?: 0 points Count backwards from 20 to 1: 0 points Say the months of the year in reverse: 0 points Repeat the address phrase from earlier: 0 points 6 CIT Score: 0 points  Advance Directives (For Healthcare) Does Patient Have a Medical Advance Directive?: Yes Type of Advance Directive: Healthcare Power of [place]; Living will Copy of Healthcare Power of Attorney in Chart?: Yes - validated most recent copy scanned in chart (See row information) Copy of Living Will in Chart?: Yes - validated most recent copy scanned in chart (See row  information)  Reviewed/Updated  Reviewed/Updated: Reviewed All (Medical, Surgical, Family, Medications, Allergies, Care Teams, Patient Goals)  Allergies (verified) Advair diskus [fluticasone -salmeterol], Codeine, Hydrocodone , Levothyroxine , Oxycodone , Statins, and Tetracyclines & related   Current Medications (verified) Outpatient Encounter Medications as of 10/05/2024  Medication Sig   Accu-Chek Softclix Lancets lancets Use as instructed to monitor glucose twice daily.   acetaminophen  (TYLENOL ) 500 MG tablet Take 500 mg by mouth every 6 (six) hours as needed.   albuterol  (VENTOLIN  HFA) 108 (90 Base) MCG/ACT inhaler Inhale 2 puffs into the lungs every 6 (six) hours as needed for wheezing or shortness of breath.   amLODipine  (NORVASC ) 5 MG tablet TAKE 1 TABLET BY MOUTH EVERY DAY   blood glucose meter kit and supplies KIT Dispense based on patient and insurance preference. Use twice daily (before breakfast and before bed).   fluticasone  (FLONASE ) 50 MCG/ACT nasal spray Place 2 sprays into both nostrils daily.   glipiZIDE  (GLUCOTROL  XL) 2.5 MG 24 hr tablet Take 1 tablet (2.5 mg total) by mouth daily with breakfast.   glucose blood (ACCU-CHEK GUIDE TEST) test strip Use as instructed to monitor glucose twice daily.   insulin  glargine (LANTUS ) 100 unit/mL SOPN Inject 40 Units into the skin at bedtime.   Insulin  Pen Needle (PEN NEEDLES) 31G X 6 MM MISC Use to inject insulin  once daily   metoprolol  tartrate (LOPRESSOR ) 25 MG tablet TAKE 1 TABLET(25 MG) BY MOUTH TWICE DAILY   pantoprazole  (PROTONIX ) 40 MG tablet TAKE 1 TABLET(40 MG) BY MOUTH TWICE DAILY   thyroid  (NP THYROID ) 60 MG tablet TAKE 1 TABLET BY MOUTH EVERY DAY BEFORE BREAKFAST   Vitamin D , Ergocalciferol , (DRISDOL ) 1.25 MG (50000 UNIT) CAPS capsule Take 1 capsule (50,000 Units total) by mouth every 7 (seven) days.   valACYclovir  (VALTREX ) 1000 MG tablet TAKE 1 TABLET BY MOUTH TWICE DAILY (Patient not taking: Reported on 10/05/2024)   No  facility-administered encounter medications on file as of 10/05/2024.    History: Past Medical History:  Diagnosis Date   Allergy    Seasonal   Arthritis    Asthma    takes Singulair  nightly   Diabetes mellitus    takes levemir  daily and Metformin  daily   Generalized headaches    GERD (gastroesophageal reflux disease)    takes Omeprazole  daily   Herniated disc, cervical    History of kidney stones    Hyperlipidemia    takes Welchol  daily   Hypertension    takes Enalapril  dialy   Hypothyroidism    takes Synthroid  daily   IBS (irritable bowel syndrome)    IPF (idiopathic pulmonary fibrosis) (HCC) 2024   Joint pain    hands and neck   Nocturia    PONV (postoperative nausea and vomiting)    TIA (transient ischemic attack)    diagnosed by EEG but never knew she had it   Trigger finger    Weakness    Weakness    in both hands   Past Surgical History:  Procedure Laterality Date   ABDOMINAL HYSTERECTOMY  1980   ANTERIOR CERVICAL DECOMP/DISCECTOMY FUSION N/A 10/18/2013   Procedure: Cervical three-four, Cervical four-five, Cervical seven-Thoracic one anterior cervical decompression with fusion plating and bonegraft;  Surgeon: Lamar LELON Peaches, MD;  Location: [facility];  Service: Neurosurgery;  Laterality: N/A;  Cervical three-four, Cervical four-five, Cervical seven-Thoracic one anterior cervical decompression with fusion plating and bonegraft   APPENDECTOMY     done with hysterectomy   BACK SURGERY  2007   lumbar fusion   BIOPSY  03/19/2023   Procedure: BIOPSY;  Surgeon: Eartha Angelia Sieving, MD;  Location: [facility];  Service: Gastroenterology;;   carpel tunnel Bilateral    left first then right   cataract implants     CHOLECYSTECTOMY N/A 11/22/2021   Procedure: LAPAROSCOPIC CHOLECYSTECTOMY;  Surgeon: Evonnie Dorothyann LABOR, DO;  Location: [facility];  Service: General;  Laterality: N/A;   COLONOSCOPY     COLONOSCOPY WITH PROPOFOL  N/A 11/27/2022   Procedure:  COLONOSCOPY WITH PROPOFOL ;  Surgeon: Eartha Angelia Sieving, MD;  Location: [facility];  Service: Gastroenterology;  Laterality: N/A;  10:00am, asa 1-2   CYSTOSCOPY WITH RETROGRADE PYELOGRAM, URETEROSCOPY AND STENT PLACEMENT Left 04/08/2022   Procedure: CYSTOSCOPY WITH RETROGRADE PYELOGRAM, URETEROSCOPY AND STENT PLACEMENT;  Surgeon: Matilda Senior, MD;  Location: [facility];  Service: Urology;  Laterality: Left;   ESOPHAGOGASTRODUODENOSCOPY     hx of h pylori   ESOPHAGOGASTRODUODENOSCOPY (EGD) WITH PROPOFOL  N/A 03/19/2023   Procedure: ESOPHAGOGASTRODUODENOSCOPY (EGD) WITH PROPOFOL ;  Surgeon: Eartha Angelia Sieving, MD;  Location[facility];  Service: Gastroenterology;  Laterality: N/A;  10:30am;asa 2   EYE SURGERY  11/18/2008   right - cataracts   EYE SURGERY  06/01/2009   left - cataracts   herniated disc   10/2000, 08/31/2008   C5-C6 then C6-C7   HOLMIUM LASER APPLICATION Left 04/08/2022   Procedure: HOLMIUM LASER APPLICATION;  Surgeon: Matilda Senior, MD;  Location[facility];  Service: Urology;  Laterality: Left;   IR URETERAL STENT RIGHT NEW ACCESS W/O SEP NEPHROSTOMY CATH  07/26/2021   knee anthroscopy bilateral Bilateral    KNEE ARTHROSCOPY     LITHOTRIPSY  11/12/2007   right side   NEPHROLITHOTOMY Right 07/26/2021   Procedure: NEPHROLITHOTOMY PERCUTANEOUS;  Surgeon: Matilda Senior, MD;  Location: [facility];  Service: Urology;  Laterality: Right;   Parathyroid  surgery     October 2013   PARATHYROIDECTOMY  07/02/2012   Procedure: PARATHYROIDECTOMY;  Surgeon: Krystal CHRISTELLA Spinner, MD;  Location: [facility];  Service: General;  Laterality: N/A;   SPINE SURGERY     TONSILLECTOMY     as a child   Family History  Problem Relation Age of Onset   Cancer Mother        pancreatic   Social History   Occupational History   Not on file  Tobacco Use   Smoking status: Former    Current packs/day: 0.00    Average packs/day: 1 pack/day for  40.0 years (40.0 ttl pk-yrs)    Types: Cigarettes    Start date: 07/19/1972    Quit date: 07/19/2012    Years since quitting: 12.2    Passive exposure: Past   Smokeless tobacco: Never   Tobacco comments:    quit smoking in Oct 2013  Vaping Use   Vaping status: Never Used  Substance and Sexual Activity   Alcohol use: Not Currently    Comment: Very very rarely. Glass of wine or 1 mixed drink   Drug use: Never   Sexual activity: Not Currently    Birth control/protection: Surgical   Tobacco Counseling Counseling given: Yes Tobacco comments: quit smoking in Oct 2013  SDOH Screenings   Food Insecurity: No Food Insecurity (10/05/2024)  Housing: Low Risk (10/05/2024)  Transportation Needs: No Transportation Needs (10/05/2024)  Utilities: Not At Risk (10/05/2024)  Alcohol Screen: Low Risk (09/04/2024)  Depression (PHQ2-9): Low Risk (10/05/2024)  Financial Resource Strain: Low Risk (09/04/2024)  Physical Activity: Inactive (10/05/2024)  Social Connections: Socially Isolated (10/05/2024)  Stress: No Stress Concern Present (10/05/2024)  Recent Concern: Stress -  Stress Concern Present (09/04/2024)  Tobacco Use: Medium Risk (10/05/2024)  Health Literacy: Adequate Health Literacy (10/05/2024)   See flowsheets for full screening details  Depression Screen PHQ 2 & 9 Depression Scale- Over the past 2 weeks, how often have you been bothered by any of the following problems? Little interest or pleasure in doing things: 0 Feeling down, depressed, or hopeless (PHQ Adolescent also includes...irritable): 0 PHQ-2 Total Score: 0 Trouble falling or staying asleep, or sleeping too much: 0 Feeling tired or having little energy: 0 Poor appetite or overeating (PHQ Adolescent also includes...weight loss): 0 Feeling bad about yourself - or that you are a failure or have let yourself or your family down: 0 Trouble concentrating on things, such as reading the newspaper or watching television (PHQ Adolescent also  includes...like school work): 0 Moving or speaking so slowly that other people could have noticed. Or the opposite - being so fidgety or restless that you have been moving around a lot more than usual: 0 Thoughts that you would be better off dead, or of hurting yourself in some way: 0 PHQ-9 Total Score: 0 If you checked off any problems, how difficult have these problems made it for you to do your work, take care of things at home, or get along with other people?: Not difficult at all  Depression Treatment Depression Interventions/Treatment : [id number] Score <4 Follow-up Not Indicated     Goals Addressed             This Visit's Progress    remain active and healthy               Objective:    Today's Vitals   10/05/24 1151  Weight: 152 lb (68.9 kg)  Height: 5' 6 (1.676 m)   Body mass index is 24.53 kg/m.  Hearing/Vision screen Hearing Screening - Comments:: Patient denies any hearing difficulties.   Vision Screening - Comments:: Wears rx glasses - up to date with routine eye exams with  Oneil Kawasaki Immunizations and Health Maintenance Health Maintenance  Topic Date Due   Pneumococcal Vaccine: 50+ Years (1 of 2 - PCV) Never done   Zoster Vaccines- Shingrix (1 of 2) Never done   Bone Density Scan  06/29/2019   Medicare Annual Wellness (AWV)  03/06/2024   COVID-19 Vaccine (5 - 2025-26 season) 05/31/2024   DTaP/Tdap/Td (2 - Td or Tdap) 07/12/2024   Diabetic kidney evaluation - Urine ACR  09/01/2024   OPHTHALMOLOGY EXAM  01/27/2025   HEMOGLOBIN A1C  01/30/2025   Diabetic kidney evaluation - eGFR measurement  03/09/2025   FOOT EXAM  03/09/2025   Mammogram  07/12/2025   Lung Cancer Screening  07/19/2025   Colonoscopy  03/18/2028   Influenza Vaccine  Completed   Hepatitis C Screening  Completed   Meningococcal B Vaccine  Aged Out   Fecal DNA (Cologuard)  Discontinued        Assessment/Plan:  This is a routine wellness examination for Genelda.  Patient Care  Team: Tobie Suzzane POUR, MD as PCP - General (Internal Medicine) Eda Baxter, MD as Referring Physician (Dermatology) Jude Harden GAILS, MD as Consulting Physician (Pulmonary Disease) Alvan Dorn FALCON, MD as Consulting Physician (Cardiology) Kawasaki Oneil, DO (Optometry) Therisa Benton PARAS, NP as Nurse Practitioner (Endocrinology) Kristie Staggers, FNP (Obstetrics and Gynecology)  I have personally reviewed and noted the following in the patients chart:   Medical and social history Use of alcohol, tobacco or illicit drugs  Current medications and supplements including opioid  prescriptions. Functional ability and status Nutritional status Physical activity Advanced directives List of other physicians Hospitalizations, surgeries, and ER visits in previous 12 months Vitals Screenings to include cognitive, depression, and falls Referrals and appointments  Orders Placed This Encounter  Procedures   DG Bone Density    Standing Status:   Future    Expiration Date:   10/05/2025    Reason for Exam (SYMPTOM  OR DIAGNOSIS REQUIRED):   post menopausal estrogen deficient    Preferred imaging location?:   [facility]   In addition, I have reviewed and discussed with patient certain preventive protocols, quality metrics, and best practice recommendations. A written personalized care plan for preventive services as well as general preventive health recommendations were provided to patient.   Banessa Mao, CMA   10/05/2024   Return October 07, 2025 at 10:00 am, for In office Medicare Well Visit w  Wellness Nurse.  After Visit Summary: (MyChart) Due to this being a telephonic visit, the after visit summary with patients personalized plan was offered to patient via MyChart    "

## 2024-10-06 ENCOUNTER — Ambulatory Visit

## 2024-10-21 ENCOUNTER — Other Ambulatory Visit: Payer: Self-pay | Admitting: Internal Medicine

## 2024-10-21 DIAGNOSIS — J309 Allergic rhinitis, unspecified: Secondary | ICD-10-CM

## 2024-10-22 ENCOUNTER — Ambulatory Visit (INDEPENDENT_AMBULATORY_CARE_PROVIDER_SITE_OTHER): Admitting: Pulmonary Disease

## 2024-10-22 ENCOUNTER — Other Ambulatory Visit: Payer: Self-pay

## 2024-10-22 ENCOUNTER — Inpatient Hospital Stay (HOSPITAL_BASED_OUTPATIENT_CLINIC_OR_DEPARTMENT_OTHER)
Admission: EM | Admit: 2024-10-22 | Discharge: 2024-10-26 | DRG: 308 | Disposition: A | Source: Ambulatory Visit | Attending: Internal Medicine | Admitting: Internal Medicine

## 2024-10-22 ENCOUNTER — Encounter (HOSPITAL_BASED_OUTPATIENT_CLINIC_OR_DEPARTMENT_OTHER): Payer: Self-pay | Admitting: Emergency Medicine

## 2024-10-22 ENCOUNTER — Encounter (HOSPITAL_BASED_OUTPATIENT_CLINIC_OR_DEPARTMENT_OTHER): Payer: Self-pay | Admitting: Pulmonary Disease

## 2024-10-22 ENCOUNTER — Emergency Department (HOSPITAL_BASED_OUTPATIENT_CLINIC_OR_DEPARTMENT_OTHER)

## 2024-10-22 VITALS — BP 137/82 | HR 141 | Ht 66.0 in | Wt 163.0 lb

## 2024-10-22 DIAGNOSIS — K219 Gastro-esophageal reflux disease without esophagitis: Secondary | ICD-10-CM | POA: Diagnosis present

## 2024-10-22 DIAGNOSIS — J45909 Unspecified asthma, uncomplicated: Secondary | ICD-10-CM | POA: Diagnosis present

## 2024-10-22 DIAGNOSIS — I10 Essential (primary) hypertension: Secondary | ICD-10-CM | POA: Diagnosis present

## 2024-10-22 DIAGNOSIS — F32A Depression, unspecified: Secondary | ICD-10-CM | POA: Diagnosis present

## 2024-10-22 DIAGNOSIS — Z8673 Personal history of transient ischemic attack (TIA), and cerebral infarction without residual deficits: Secondary | ICD-10-CM

## 2024-10-22 DIAGNOSIS — I441 Atrioventricular block, second degree: Secondary | ICD-10-CM | POA: Diagnosis present

## 2024-10-22 DIAGNOSIS — I340 Nonrheumatic mitral (valve) insufficiency: Secondary | ICD-10-CM | POA: Diagnosis not present

## 2024-10-22 DIAGNOSIS — Z888 Allergy status to other drugs, medicaments and biological substances status: Secondary | ICD-10-CM

## 2024-10-22 DIAGNOSIS — I110 Hypertensive heart disease with heart failure: Secondary | ICD-10-CM | POA: Diagnosis present

## 2024-10-22 DIAGNOSIS — Z794 Long term (current) use of insulin: Secondary | ICD-10-CM

## 2024-10-22 DIAGNOSIS — Z604 Social exclusion and rejection: Secondary | ICD-10-CM | POA: Diagnosis present

## 2024-10-22 DIAGNOSIS — I361 Nonrheumatic tricuspid (valve) insufficiency: Secondary | ICD-10-CM | POA: Diagnosis not present

## 2024-10-22 DIAGNOSIS — Z7984 Long term (current) use of oral hypoglycemic drugs: Secondary | ICD-10-CM

## 2024-10-22 DIAGNOSIS — I4892 Unspecified atrial flutter: Secondary | ICD-10-CM | POA: Diagnosis present

## 2024-10-22 DIAGNOSIS — Z79899 Other long term (current) drug therapy: Secondary | ICD-10-CM

## 2024-10-22 DIAGNOSIS — Z87442 Personal history of urinary calculi: Secondary | ICD-10-CM

## 2024-10-22 DIAGNOSIS — E785 Hyperlipidemia, unspecified: Secondary | ICD-10-CM | POA: Diagnosis present

## 2024-10-22 DIAGNOSIS — I483 Typical atrial flutter: Secondary | ICD-10-CM | POA: Diagnosis present

## 2024-10-22 DIAGNOSIS — I5020 Unspecified systolic (congestive) heart failure: Secondary | ICD-10-CM | POA: Diagnosis not present

## 2024-10-22 DIAGNOSIS — Z981 Arthrodesis status: Secondary | ICD-10-CM

## 2024-10-22 DIAGNOSIS — R0902 Hypoxemia: Secondary | ICD-10-CM | POA: Diagnosis not present

## 2024-10-22 DIAGNOSIS — I4891 Unspecified atrial fibrillation: Secondary | ICD-10-CM | POA: Diagnosis present

## 2024-10-22 DIAGNOSIS — J449 Chronic obstructive pulmonary disease, unspecified: Secondary | ICD-10-CM | POA: Diagnosis not present

## 2024-10-22 DIAGNOSIS — G4700 Insomnia, unspecified: Secondary | ICD-10-CM | POA: Diagnosis present

## 2024-10-22 DIAGNOSIS — E039 Hypothyroidism, unspecified: Secondary | ICD-10-CM | POA: Diagnosis present

## 2024-10-22 DIAGNOSIS — Z885 Allergy status to narcotic agent status: Secondary | ICD-10-CM

## 2024-10-22 DIAGNOSIS — I5031 Acute diastolic (congestive) heart failure: Secondary | ICD-10-CM | POA: Diagnosis present

## 2024-10-22 DIAGNOSIS — E1169 Type 2 diabetes mellitus with other specified complication: Secondary | ICD-10-CM | POA: Diagnosis present

## 2024-10-22 DIAGNOSIS — Z7901 Long term (current) use of anticoagulants: Secondary | ICD-10-CM | POA: Diagnosis not present

## 2024-10-22 DIAGNOSIS — J84112 Idiopathic pulmonary fibrosis: Secondary | ICD-10-CM | POA: Diagnosis present

## 2024-10-22 DIAGNOSIS — J849 Interstitial pulmonary disease, unspecified: Secondary | ICD-10-CM

## 2024-10-22 DIAGNOSIS — Z87891 Personal history of nicotine dependence: Secondary | ICD-10-CM | POA: Diagnosis not present

## 2024-10-22 DIAGNOSIS — Z90710 Acquired absence of both cervix and uterus: Secondary | ICD-10-CM | POA: Diagnosis not present

## 2024-10-22 DIAGNOSIS — N179 Acute kidney failure, unspecified: Secondary | ICD-10-CM | POA: Diagnosis present

## 2024-10-22 DIAGNOSIS — I959 Hypotension, unspecified: Secondary | ICD-10-CM | POA: Diagnosis not present

## 2024-10-22 DIAGNOSIS — Z9049 Acquired absence of other specified parts of digestive tract: Secondary | ICD-10-CM

## 2024-10-22 LAB — COMPREHENSIVE METABOLIC PANEL WITH GFR
ALT: 17 U/L (ref 0–44)
AST: 23 U/L (ref 15–41)
Albumin: 4.6 g/dL (ref 3.5–5.0)
Alkaline Phosphatase: 82 U/L (ref 38–126)
Anion gap: 14 (ref 5–15)
BUN: 20 mg/dL (ref 8–23)
CO2: 23 mmol/L (ref 22–32)
Calcium: 9.3 mg/dL (ref 8.9–10.3)
Chloride: 102 mmol/L (ref 98–111)
Creatinine, Ser: 1 mg/dL (ref 0.44–1.00)
GFR, Estimated: 58 mL/min — ABNORMAL LOW
Glucose, Bld: 255 mg/dL — ABNORMAL HIGH (ref 70–99)
Potassium: 3.9 mmol/L (ref 3.5–5.1)
Sodium: 139 mmol/L (ref 135–145)
Total Bilirubin: 0.6 mg/dL (ref 0.0–1.2)
Total Protein: 6.7 g/dL (ref 6.5–8.1)

## 2024-10-22 LAB — TROPONIN T, HIGH SENSITIVITY
Troponin T High Sensitivity: 8 ng/L (ref 0–19)
Troponin T High Sensitivity: 8 ng/L (ref 0–19)

## 2024-10-22 LAB — GLUCOSE, CAPILLARY
Glucose-Capillary: 89 mg/dL (ref 70–99)
Glucose-Capillary: 202 mg/dL — ABNORMAL HIGH (ref 70–99)

## 2024-10-22 LAB — TSH: TSH: 1.94 u[IU]/mL (ref 0.350–4.500)

## 2024-10-22 LAB — PRO BRAIN NATRIURETIC PEPTIDE: Pro Brain Natriuretic Peptide: 1319 pg/mL — ABNORMAL HIGH

## 2024-10-22 LAB — CBC
HCT: 43.5 % (ref 36.0–46.0)
Hemoglobin: 13.7 g/dL (ref 12.0–15.0)
MCH: 25.4 pg — ABNORMAL LOW (ref 26.0–34.0)
MCHC: 31.5 g/dL (ref 30.0–36.0)
MCV: 80.6 fL (ref 80.0–100.0)
Platelets: 243 K/uL (ref 150–400)
RBC: 5.4 MIL/uL — ABNORMAL HIGH (ref 3.87–5.11)
RDW: 14.6 % (ref 11.5–15.5)
WBC: 6.7 K/uL (ref 4.0–10.5)
nRBC: 0 % (ref 0.0–0.2)

## 2024-10-22 LAB — MAGNESIUM: Magnesium: 2 mg/dL (ref 1.7–2.4)

## 2024-10-22 MED ORDER — ADULT MULTIVITAMIN W/MINERALS CH
1.0000 | ORAL_TABLET | Freq: Every day | ORAL | Status: DC
  Filled 2024-10-22 (×4): qty 1

## 2024-10-22 MED ORDER — METOPROLOL TARTRATE 5 MG/5ML IV SOLN
5.0000 mg | Freq: Once | INTRAVENOUS | Status: AC
  Administered 2024-10-22: 5 mg via INTRAVENOUS
  Filled 2024-10-22: qty 5

## 2024-10-22 MED ORDER — ACETAMINOPHEN 650 MG RE SUPP: 650.0000 mg | Freq: Four times a day (QID) | RECTAL | Status: DC | PRN

## 2024-10-22 MED ORDER — HEPARIN (PORCINE) 25000 UT/250ML-% IV SOLN
1000.0000 [IU]/h | INTRAVENOUS | Status: DC
  Administered 2024-10-22: 1000 [IU]/h via INTRAVENOUS
  Filled 2024-10-22: qty 250

## 2024-10-22 MED ORDER — ACETAMINOPHEN 325 MG PO TABS
650.0000 mg | ORAL_TABLET | Freq: Four times a day (QID) | ORAL | Status: DC | PRN
  Administered 2024-10-22 – 2024-10-25 (×7): 650 mg via ORAL
  Filled 2024-10-22 (×7): qty 2

## 2024-10-22 MED ORDER — METOPROLOL TARTRATE 25 MG PO TABS: 25.0000 mg | ORAL_TABLET | Freq: Two times a day (BID) | ORAL | Status: DC

## 2024-10-22 MED ORDER — FLUTICASONE PROPIONATE 50 MCG/ACT NA SUSP
2.0000 | Freq: Every day | NASAL | Status: DC
  Administered 2024-10-23 – 2024-10-26 (×4): 2 via NASAL
  Filled 2024-10-22: qty 16

## 2024-10-22 MED ORDER — PANTOPRAZOLE SODIUM 40 MG PO TBEC
40.0000 mg | DELAYED_RELEASE_TABLET | Freq: Every day | ORAL | Status: DC
  Administered 2024-10-22 – 2024-10-26 (×5): 40 mg via ORAL
  Filled 2024-10-22 (×5): qty 1

## 2024-10-22 MED ORDER — SODIUM CHLORIDE 0.9% FLUSH: 3.0000 mL | INTRAVENOUS | Status: DC | PRN

## 2024-10-22 MED ORDER — ALBUTEROL SULFATE (2.5 MG/3ML) 0.083% IN NEBU: 2.5000 mg | INHALATION_SOLUTION | RESPIRATORY_TRACT | Status: DC | PRN

## 2024-10-22 MED ORDER — APIXABAN 5 MG PO TABS
5.0000 mg | ORAL_TABLET | Freq: Two times a day (BID) | ORAL | Status: DC
  Administered 2024-10-22 – 2024-10-26 (×8): 5 mg via ORAL
  Filled 2024-10-22 (×8): qty 1

## 2024-10-22 MED ORDER — HEPARIN BOLUS VIA INFUSION
4000.0000 [IU] | Freq: Once | INTRAVENOUS | Status: AC
  Administered 2024-10-22: 4000 [IU] via INTRAVENOUS

## 2024-10-22 MED ORDER — FUROSEMIDE 10 MG/ML IJ SOLN
20.0000 mg | Freq: Once | INTRAMUSCULAR | Status: AC
  Administered 2024-10-22: 20 mg via INTRAVENOUS
  Filled 2024-10-22: qty 2

## 2024-10-22 MED ORDER — THYROID 60 MG PO TABS
60.0000 mg | ORAL_TABLET | Freq: Every day | ORAL | Status: DC
  Administered 2024-10-23 – 2024-10-26 (×4): 60 mg via ORAL
  Filled 2024-10-22 (×4): qty 1

## 2024-10-22 MED ORDER — INSULIN ASPART 100 UNIT/ML IJ SOLN: 0.0000 [IU] | Freq: Three times a day (TID) | INTRAMUSCULAR | Status: DC

## 2024-10-22 MED ORDER — METOPROLOL TARTRATE 25 MG PO TABS
25.0000 mg | ORAL_TABLET | Freq: Once | ORAL | Status: AC
  Administered 2024-10-22: 25 mg via ORAL
  Filled 2024-10-22: qty 1

## 2024-10-22 MED ORDER — ONDANSETRON HCL 4 MG/2ML IJ SOLN
4.0000 mg | Freq: Four times a day (QID) | INTRAMUSCULAR | Status: DC | PRN
  Administered 2024-10-23: 4 mg via INTRAVENOUS
  Filled 2024-10-22: qty 2

## 2024-10-22 MED ORDER — METOPROLOL TARTRATE 25 MG PO TABS
25.0000 mg | ORAL_TABLET | Freq: Two times a day (BID) | ORAL | Status: DC
  Administered 2024-10-22: 25 mg via ORAL
  Filled 2024-10-22: qty 1

## 2024-10-22 MED ORDER — METOPROLOL TARTRATE 50 MG PO TABS
50.0000 mg | ORAL_TABLET | Freq: Three times a day (TID) | ORAL | Status: DC
  Administered 2024-10-22: 50 mg via ORAL
  Filled 2024-10-22 (×2): qty 1

## 2024-10-22 MED ORDER — SODIUM CHLORIDE 0.9 % IV SOLN: 250.0000 mL | INTRAVENOUS | Status: AC | PRN

## 2024-10-22 MED ORDER — SODIUM CHLORIDE 0.9% FLUSH
3.0000 mL | Freq: Two times a day (BID) | INTRAVENOUS | Status: DC
  Administered 2024-10-22 – 2024-10-25 (×5): 3 mL via INTRAVENOUS

## 2024-10-22 MED ORDER — INSULIN ASPART 100 UNIT/ML IJ SOLN
0.0000 [IU] | Freq: Three times a day (TID) | INTRAMUSCULAR | Status: DC
  Administered 2024-10-23 (×3): 2 [IU] via SUBCUTANEOUS
  Administered 2024-10-24: 3 [IU] via SUBCUTANEOUS
  Administered 2024-10-24 (×2): 1 [IU] via SUBCUTANEOUS
  Filled 2024-10-22: qty 2
  Filled 2024-10-22 (×2): qty 1
  Filled 2024-10-22: qty 2
  Filled 2024-10-22: qty 3

## 2024-10-22 MED ORDER — DILTIAZEM HCL-DEXTROSE 125-5 MG/125ML-% IV SOLN (PREMIX)
5.0000 mg/h | INTRAVENOUS | Status: DC
  Administered 2024-10-22: 5 mg/h via INTRAVENOUS
  Administered 2024-10-22 – 2024-10-23 (×2): 10 mg/h via INTRAVENOUS
  Administered 2024-10-23 (×2): 12.5 mg/h via INTRAVENOUS
  Administered 2024-10-23: 15 mg/h via INTRAVENOUS
  Administered 2024-10-23: 5 mg/h via INTRAVENOUS
  Administered 2024-10-23: 10 mg/h via INTRAVENOUS
  Administered 2024-10-24 – 2024-10-25 (×3): 12.5 mg/h via INTRAVENOUS
  Filled 2024-10-22 (×6): qty 125

## 2024-10-22 MED ORDER — ONDANSETRON HCL 4 MG PO TABS: 4.0000 mg | ORAL_TABLET | Freq: Four times a day (QID) | ORAL | Status: DC | PRN

## 2024-10-22 MED ORDER — INSULIN GLARGINE 100 UNIT/ML ~~LOC~~ SOLN
40.0000 [IU] | Freq: Every day | SUBCUTANEOUS | Status: DC
  Administered 2024-10-22: 40 [IU] via SUBCUTANEOUS
  Filled 2024-10-22 (×2): qty 0.4

## 2024-10-22 NOTE — ED Triage Notes (Signed)
 Elevated heart rate, new aflutter  Sent from pulm upstairs. Also reports some sob,swelling and wt gain Some chest tightness

## 2024-10-22 NOTE — ED Provider Notes (Signed)
 " [facility] Provider Note   CSN: [id number] Arrival date & time: 10/22/24  1248     Patient presents with: Tachycardia   [name] is a 74 y.o. female.  {Add pertinent medical, surgical, social history, OB history to YEP:67052} HPI Patient comes from Dr. Cyndi office.  Was following up for her interstitial lung disease.  Found to have a heart rate 140.  Found to be in new onset atrial flutter.  Has been short of breath now for around 3 weeks.  Has also put on about 10 pounds during that time.  Some shortness of breath.  Some chest tightness.   Past Medical History:  Diagnosis Date   Allergy    Seasonal   Arthritis    Asthma    takes Singulair  nightly   Diabetes mellitus    takes levemir  daily and Metformin  daily   Generalized headaches    GERD (gastroesophageal reflux disease)    takes Omeprazole  daily   Herniated disc, cervical    History of kidney stones    Hyperlipidemia    takes Welchol  daily   Hypertension    takes Enalapril  dialy   Hypothyroidism    takes Synthroid  daily   IBS (irritable bowel syndrome)    IPF (idiopathic pulmonary fibrosis) (HCC) 2024   Joint pain    hands and neck   Nocturia    PONV (postoperative nausea and vomiting)    TIA (transient ischemic attack)    diagnosed by EEG but never knew she had it   Trigger finger    Weakness    Weakness    in both hands   Past Surgical History:  Procedure Laterality Date   ABDOMINAL HYSTERECTOMY  1980   ANTERIOR CERVICAL DECOMP/DISCECTOMY FUSION N/A 10/18/2013   Procedure: Cervical three-four, Cervical four-five, Cervical seven-Thoracic one anterior cervical decompression with fusion plating and bonegraft;  Surgeon: Lamar LELON Peaches, MD;  Location: [facility];  Service: Neurosurgery;  Laterality: N/A;  Cervical three-four, Cervical four-five, Cervical seven-Thoracic one anterior cervical decompression with fusion plating and bonegraft   APPENDECTOMY      done with hysterectomy   BACK SURGERY  2007   lumbar fusion   BIOPSY  03/19/2023   Procedure: BIOPSY;  Surgeon: Eartha Flavors, Toribio, MD;  [facility]: [facility];  Service: Gastroenterology;;   carpel tunnel Bilateral    left first then right   cataract implants     CHOLECYSTECTOMY N/A 11/22/2021   Procedure: LAPAROSCOPIC CHOLECYSTECTOMY;  Surgeon: Evonnie Dorothyann LABOR, DO;  Location: [facility];  Service: General;  Laterality: N/A;   COLONOSCOPY     COLONOSCOPY WITH PROPOFOL  N/A 11/27/2022   Procedure: COLONOSCOPY WITH PROPOFOL ;  Surgeon: Eartha Flavors Toribio, MD;  Location: [facility];  Service: Gastroenterology;  Laterality: N/A;  10:00am, asa 1-2   CYSTOSCOPY WITH RETROGRADE PYELOGRAM, URETEROSCOPY AND STENT PLACEMENT Left 04/08/2022   Procedure: CYSTOSCOPY WITH RETROGRADE PYELOGRAM, URETEROSCOPY AND STENT PLACEMENT;  Surgeon: Matilda Senior, MD;  Location: [facility];  Service: Urology;  Laterality: Left;   ESOPHAGOGASTRODUODENOSCOPY     hx of h pylori   ESOPHAGOGASTRODUODENOSCOPY (EGD) WITH PROPOFOL  N/A 03/19/2023   Procedure: ESOPHAGOGASTRODUODENOSCOPY (EGD) WITH PROPOFOL ;  Surgeon: Eartha Flavors Toribio, MD;  [facility]: [facility];  Service: Gastroenterology;  Laterality: N/A;  10:30am;asa 2   EYE SURGERY  11/18/2008   right - cataracts   EYE SURGERY  06/01/2009   left - cataracts   herniated disc  10/2000, 08/31/2008   C5-C6 then C6-C7   HOLMIUM LASER APPLICATION Left 04/08/2022   Procedure: HOLMIUM LASER APPLICATION;  Surgeon: Matilda Senior, MD;  Location: [facility];  Service: Urology;  Laterality: Left;   IR URETERAL STENT RIGHT NEW ACCESS W/O SEP NEPHROSTOMY CATH  07/26/2021   knee anthroscopy bilateral Bilateral    KNEE ARTHROSCOPY     LITHOTRIPSY  11/12/2007   right side   NEPHROLITHOTOMY Right 07/26/2021   Procedure: NEPHROLITHOTOMY PERCUTANEOUS;  Surgeon: Matilda Senior, MD;  Location: [facility];   Service: Urology;  Laterality: Right;   Parathyroid  surgery     October 2013   PARATHYROIDECTOMY  07/02/2012   Procedure: PARATHYROIDECTOMY;  Surgeon: Krystal CHRISTELLA Spinner, MD;  Location: [facility];  Service: General;  Laterality: N/A;   SPINE SURGERY     TONSILLECTOMY     as a child     Prior to Admission medications  Medication Sig Start Date End Date Taking? Authorizing Provider  Accu-Chek Softclix Lancets lancets Use as instructed to monitor glucose twice daily. 08/31/24   Therisa Benton PARAS, NP  acetaminophen  (TYLENOL ) 500 MG tablet Take 500 mg by mouth every 6 (six) hours as needed.    [provider]  albuterol  (VENTOLIN  HFA) 108 (90 Base) MCG/ACT inhaler Inhale 2 puffs into the lungs every 6 (six) hours as needed for wheezing or shortness of breath. 01/09/24   Parrett, Madelin RAMAN, NP  amLODipine  (NORVASC ) 5 MG tablet TAKE 1 TABLET BY MOUTH EVERY DAY 09/20/24   Tobie Suzzane POUR, MD  blood glucose meter kit and supplies KIT Dispense based on patient and insurance preference. Use twice daily (before breakfast and before bed). 07/30/21   Therisa Benton PARAS, NP  fluticasone  (FLONASE ) 50 MCG/ACT nasal spray SHAKE LIQUID AND USE 2 SPRAYS IN EACH NOSTRIL DAILY 10/22/24   Tobie Suzzane POUR, MD  glipiZIDE  (GLUCOTROL  XL) 2.5 MG 24 hr tablet Take 1 tablet (2.5 mg total) by mouth daily with breakfast. 08/02/24   Therisa Benton PARAS, NP  glucose blood (ACCU-CHEK GUIDE TEST) test strip Use as instructed to monitor glucose twice daily. 12/15/23   Therisa Benton PARAS, NP  insulin  glargine (LANTUS ) 100 unit/mL SOPN Inject 40 Units into the skin at bedtime. 08/02/24   Therisa Benton PARAS, NP  Insulin  Pen Needle (PEN NEEDLES) 31G X 6 MM MISC Use to inject insulin  once daily 06/10/23   Therisa Benton PARAS, NP  metoprolol  tartrate (LOPRESSOR ) 25 MG tablet TAKE 1 TABLET(25 MG) BY MOUTH TWICE DAILY 01/12/24   Tobie Suzzane POUR, MD  pantoprazole  (PROTONIX ) 40 MG tablet TAKE 1 TABLET(40 MG) BY MOUTH TWICE DAILY 06/17/24   Tobie Suzzane POUR, MD  thyroid  (NP THYROID ) 60 MG tablet TAKE 1 TABLET BY MOUTH EVERY DAY BEFORE BREAKFAST 03/19/24   Therisa Benton PARAS, NP  Vitamin D , Ergocalciferol , (DRISDOL ) 1.25 MG (50000 UNIT) CAPS capsule Take 1 capsule (50,000 Units total) by mouth every 7 (seven) days. 03/05/24   Therisa Benton PARAS, NP    Allergies: Advair diskus [fluticasone -salmeterol], Codeine, Hydrocodone , Levothyroxine , Oxycodone , Statins, and Tetracyclines & related    Review of Systems  Updated Vital Signs BP (!) 132/97   Pulse (!) 139   Temp 98 F (36.7 C) (Oral)   Resp (!) 21   SpO2 100%   Physical Exam Vitals and nursing note reviewed.  HENT:     Head: Normocephalic.  Cardiovascular:     Rate and Rhythm: Tachycardia present.  Pulmonary:     Comments: Mildly harsh  breath sounds without definite rales. Abdominal:     Tenderness: There is no abdominal tenderness.  Musculoskeletal:     Right lower leg: Edema present.     Left lower leg: Edema present.     Comments: Trace edema bilateral lower extremities.  Skin:    Capillary Refill: Capillary refill takes less than 2 seconds.  Neurological:     General: No focal deficit present.     Mental Status: She is alert.     (all labs ordered are listed, but only abnormal results are displayed) Labs Reviewed  CBC - Abnormal; Notable for the following components:      Result Value   RBC 5.40 (*)    MCH 25.4 (*)    All other components within normal limits  COMPREHENSIVE METABOLIC PANEL WITH GFR  PRO BRAIN NATRIURETIC PEPTIDE  TSH  MAGNESIUM   TROPONIN T, HIGH SENSITIVITY    EKG: None  Radiology: No results found.  {Document cardiac monitor, telemetry assessment procedure when appropriate:32947} Procedures   Medications Ordered in the ED  metoprolol  tartrate (LOPRESSOR ) injection 5 mg (has no administration in time range)    Clinical Course as of 10/22/24 1409  Fri Oct 22, 2024  1409 Is now had 5 mg boluses of Cardizem  twice.  Continued  tachycardia at about 137.  Will discuss with cardiology.  Will also discuss with hospitalist for admission. [NP]    Clinical Course User Index [NP] Patsey Lot, MD   {Click here for ABCD2, HEART and other calculators REFRESH Note before signing:1}                              Medical Decision Making Amount and/or Complexity of Data Reviewed Labs: ordered. Radiology: ordered.  Risk Prescription drug management. Decision regarding hospitalization.   Patient presents with tachycardia.  Appears to be in atrial flutter with RVR.  No history of same.  Not ED cardioversion candidate since unknown start time.  Potentially was 3 weeks ago however.  There is somewhat worry for heart failure with patient being up 10 pounds over the last couple weeks.  Will get x-ray.  Will get basic blood work.  Will give some IV metoprolol .  Remains tachycardic.  BNP is somewhat elevated.  Good renal function however.  Mildly elevated sugar at 255.  Normal TSH.  CRITICAL CARE Performed by: Lot Patsey Total critical care time: 30 minutes Critical care time was exclusive of separately billable procedures and treating other patients. Critical care was necessary to treat or prevent imminent or life-threatening deterioration. Critical care was time spent personally by me on the following activities: development of treatment plan with patient and/or surrogate as well as nursing, discussions with consultants, evaluation of patient's response to treatment, examination of patient, obtaining history from patient or surrogate, ordering and performing treatments and interventions, ordering and review of laboratory studies, ordering and review of radiographic studies, pulse oximetry and re-evaluation of patient's condition.     {Document critical care time when appropriate  Document review of labs and clinical decision tools ie CHADS2VASC2, etc  Document your independent review of radiology images and any  outside records  Document your discussion with family members, caretakers and with consultants  Document social determinants of health affecting pt's care  Document your decision making why or why not admission, treatments were needed:32947:::1}   Final diagnoses:  None    ED Discharge Orders     None        "

## 2024-10-22 NOTE — H&P (Signed)
 " Virtual History and Physical   Referring Provider: Rankin River MD Telemedicine Provider: Donalda Applebaum MD Provider Location: [facility] Patient Location: [facility] Referring Diagnosis: AFib RVR Patient Name and DOB verified: yes Patient consented to Telemedicine Evaluation:yes RN virtual assistant: Delanna Polite RN Video encounter time and date:10/22/24 at 2:53 pm   Patient: [name] FMW:[id number] DOB: 03/30/1951 PCP: Tobie Suzzane POUR, MD    Chief Complaint:  Chief Complaint  Patient presents with   Tachycardia   HPI: [name] is a 74 y.o. female with medical history significant of ILD (incidental finding-follows Dr. Jude), HTN, GERD, DM-2, hypothyroidism, parathyroid  adenoma-who was referred to the ED by her pulmonologist after she was found to have significant lower extremity edema, shortness of breath and newly diagnosed A-fib/flutter with RVR.  Patient was seen at her pulmonologist office today-where she had a usual follow-up visit for her interstitial lung disease-upon a routine vital sign check she was found to have a heart rate of 140-EKG confirmed new onset a flutter with RVR.  Patient does acknowledge having had more shortness of breath than her usual baseline for around 3 weeks, shortness of breath is mostly exertional-claims that first noticed that she was short of breath was when she was dancing (dances regularly)-and had to stop midway to catch her breath.  She also claims that walking out to her mailbox also makes her short of breath.  She also has had worsening lower extremity edema for the past 1 week or so-and apparently has gained around 10 pounds in the past 3 months or so.  There is no history of chest pain.  She has never had atrial fibrillation in the past.  No prior history of CVA/TIA/CAD/PAD.  She was subsequently evaluated in the emergency room-where she was given a few doses of IV metoprolol -without any improvement in her rate.  Due to  concern of underlying systolic heart failure-Cardizem  infusion was not started-the hospitalist service was asked to admit this patient for further evaluation and treatment.  I subsequently discussed with cardiology on-call-Dr. Eveline reviewed the chart-and since patient is not in obvious cardiogenic shock-recommendations were to continue oral beta-blocker and try low-dose Cardizem  infusion.  No fever No headache No chest pain No abdominal pain No nausea, vomiting or diarrhea No hematochezia/melena No hematuria No dysuria/frequency of urination   Review of Systems: As mentioned in the history of present illness. All other systems reviewed and are negative. Past Medical History:  Diagnosis Date   Allergy    Seasonal   Arthritis    Asthma    takes Singulair  nightly   Diabetes mellitus    takes levemir  daily and Metformin  daily   Generalized headaches    GERD (gastroesophageal reflux disease)    takes Omeprazole  daily   Herniated disc, cervical    History of kidney stones    Hyperlipidemia    takes Welchol  daily   Hypertension    takes Enalapril  dialy   Hypothyroidism    takes Synthroid  daily   IBS (irritable bowel syndrome)    IPF (idiopathic pulmonary fibrosis) (HCC) 2024   Joint pain    hands and neck   Nocturia    PONV (postoperative nausea and vomiting)    TIA (transient ischemic attack)    diagnosed by EEG but never knew she had it   Trigger finger    Weakness    Weakness    in both hands   Past Surgical History:  Procedure Laterality Date   ABDOMINAL HYSTERECTOMY  1980   ANTERIOR CERVICAL DECOMP/DISCECTOMY FUSION N/A 10/18/2013   Procedure: Cervical three-four, Cervical four-five, Cervical seven-Thoracic one anterior cervical decompression with fusion plating and bonegraft;  Surgeon: Lamar LELON Peaches, MD;  Location: [facility];  Service: Neurosurgery;  Laterality: N/A;  Cervical three-four, Cervical four-five, Cervical seven-Thoracic one anterior cervical  decompression with fusion plating and bonegraft   APPENDECTOMY     done with hysterectomy   BACK SURGERY  2007   lumbar fusion   BIOPSY  03/19/2023   Procedure: BIOPSY;  Surgeon: Eartha Flavors, Toribio, MD;  [facility];  Service: Gastroenterology;;   carpel tunnel Bilateral    left first then right   cataract implants     CHOLECYSTECTOMY N/A 11/22/2021   Procedure: LAPAROSCOPIC CHOLECYSTECTOMY;  Surgeon: Evonnie Dorothyann LABOR, DO;  Location: [facility];  Service: General;  Laterality: N/A;   COLONOSCOPY     COLONOSCOPY WITH PROPOFOL  N/A 11/27/2022   Procedure: COLONOSCOPY WITH PROPOFOL ;  Surgeon: Eartha Flavors Toribio, MD;  [facility];  Service: Gastroenterology;  Laterality: N/A;  10:00am, asa 1-2   CYSTOSCOPY WITH RETROGRADE PYELOGRAM, URETEROSCOPY AND STENT PLACEMENT Left 04/08/2022   Procedure: CYSTOSCOPY WITH RETROGRADE PYELOGRAM, URETEROSCOPY AND STENT PLACEMENT;  Surgeon: Matilda Senior, MD;  Location: [facility];  Service: Urology;  Laterality: Left;   ESOPHAGOGASTRODUODENOSCOPY     hx of h pylori   ESOPHAGOGASTRODUODENOSCOPY (EGD) WITH PROPOFOL  N/A 03/19/2023   Procedure: ESOPHAGOGASTRODUODENOSCOPY (EGD) WITH PROPOFOL ;  Surgeon: Eartha Flavors Toribio, MD;  [facility];  Service: Gastroenterology;  Laterality: N/A;  10:30am;asa 2   EYE SURGERY  11/18/2008   right - cataracts   EYE SURGERY  06/01/2009   left - cataracts   herniated disc   10/2000, 08/31/2008   C5-C6 then C6-C7   HOLMIUM LASER APPLICATION Left 04/08/2022   Procedure: HOLMIUM LASER APPLICATION;  Surgeon: Matilda Senior, MD;  Location: [facility];  Service: Urology;  Laterality: Left;   IR URETERAL STENT RIGHT NEW ACCESS W/O SEP NEPHROSTOMY CATH  07/26/2021   knee anthroscopy bilateral Bilateral    KNEE ARTHROSCOPY     LITHOTRIPSY  11/12/2007   right side   NEPHROLITHOTOMY Right 07/26/2021   Procedure: NEPHROLITHOTOMY  PERCUTANEOUS;  Surgeon: Matilda Senior, MD;  Location: [facility];  Service: Urology;  Laterality: Right;   Parathyroid  surgery     October 2013   PARATHYROIDECTOMY  07/02/2012   Procedure: PARATHYROIDECTOMY;  Surgeon: Krystal CHRISTELLA Spinner, MD;  Location: [facility];  Service: General;  Laterality: N/A;   SPINE SURGERY     TONSILLECTOMY     as a child   Social History:  reports that she quit smoking about 12 years ago. Her smoking use included cigarettes. She started smoking about 52 years ago. She has a 40 pack-year smoking history. She has been exposed to tobacco smoke. She has never used smokeless tobacco. She reports that she does not currently use alcohol. She reports that she does not use drugs.  Allergies[1]  Family History  Problem Relation Age of Onset   Cancer Mother        pancreatic    Prior to Admission medications  Medication Sig Start Date End Date Taking? Authorizing Provider  Accu-Chek Softclix Lancets lancets Use as instructed to monitor glucose twice daily. 08/31/24   Therisa Benton PARAS, NP  acetaminophen  (TYLENOL ) 500 MG tablet Take 500 mg by mouth every 6 (six) hours as needed.    [provider]  albuterol  (VENTOLIN   HFA) 108 (90 Base) MCG/ACT inhaler Inhale 2 puffs into the lungs every 6 (six) hours as needed for wheezing or shortness of breath. 01/09/24   Parrett, Madelin RAMAN, NP  amLODipine  (NORVASC ) 5 MG tablet TAKE 1 TABLET BY MOUTH EVERY DAY 09/20/24   Tobie Suzzane POUR, MD  blood glucose meter kit and supplies KIT Dispense based on patient and insurance preference. Use twice daily (before breakfast and before bed). 07/30/21   Therisa Benton PARAS, NP  fluticasone  (FLONASE ) 50 MCG/ACT nasal spray SHAKE LIQUID AND USE 2 SPRAYS IN EACH NOSTRIL DAILY 10/22/24   Tobie Suzzane POUR, MD  glipiZIDE  (GLUCOTROL  XL) 2.5 MG 24 hr tablet Take 1 tablet (2.5 mg total) by mouth daily with breakfast. 08/02/24   Therisa Benton PARAS, NP  glucose blood (ACCU-CHEK GUIDE TEST) test strip Use as  instructed to monitor glucose twice daily. 12/15/23   Therisa Benton PARAS, NP  insulin  glargine (LANTUS ) 100 unit/mL SOPN Inject 40 Units into the skin at bedtime. 08/02/24   Therisa Benton PARAS, NP  Insulin  Pen Needle (PEN NEEDLES) 31G X 6 MM MISC Use to inject insulin  once daily 06/10/23   Therisa Benton PARAS, NP  metoprolol  tartrate (LOPRESSOR ) 25 MG tablet TAKE 1 TABLET(25 MG) BY MOUTH TWICE DAILY 01/12/24   Tobie Suzzane POUR, MD  pantoprazole  (PROTONIX ) 40 MG tablet TAKE 1 TABLET(40 MG) BY MOUTH TWICE DAILY 06/17/24   Tobie Suzzane POUR, MD  thyroid  (NP THYROID ) 60 MG tablet TAKE 1 TABLET BY MOUTH EVERY DAY BEFORE BREAKFAST 03/19/24   Therisa Benton PARAS, NP  Vitamin D , Ergocalciferol , (DRISDOL ) 1.25 MG (50000 UNIT) CAPS capsule Take 1 capsule (50,000 Units total) by mouth every 7 (seven) days. 03/05/24   Therisa Benton PARAS, NP    Physical Exam: Vitals:   10/22/24 1330 10/22/24 1345 10/22/24 1400 10/22/24 1445  BP:  120/87 118/80 126/73  Pulse: (!) 144 (!) 140 (!) 138 (!) 136  Resp: (!) 23 20 (!) 21 13  Temp:      TempSrc:      SpO2: 100% 99% 100% 98%    Gen Exam:Alert awake-not in any distress HEENT:atraumatic, normocephalic Chest: B/L clear to auscultation anteriorly CVS:S1S2 irregular tachycardic to the 130s-40s during my evaluation. Abdomen:soft non tender, non distended Extremities:+ edema Neurology: Non focal Skin: no rash  Data Reviewed:     Latest Ref Rng & Units 10/22/2024    1:01 PM 06/23/2024   10:20 AM 03/04/2023    8:41 AM  CBC  WBC 4.0 - 10.5 K/uL 6.7  6.1  5.2   Hemoglobin 12.0 - 15.0 g/dL 86.2  84.5  85.3   Hematocrit 36.0 - 46.0 % 43.5  48.4  43.9   Platelets 150 - 400 K/uL 243  247  231         Latest Ref Rng & Units 10/22/2024    1:01 PM 03/09/2024    9:29 AM 12/08/2023    8:16 AM  BMP  Glucose 70 - 99 mg/dL 744  856  856   BUN 8 - 23 mg/dL 20  20  17    Creatinine 0.44 - 1.00 mg/dL 8.99  9.09  9.07   BUN/Creat Ratio 12 - 28  22  18    Sodium 135 - 145 mmol/L 139   140  147   Potassium 3.5 - 5.1 mmol/L 3.9  4.7  5.0   Chloride 98 - 111 mmol/L 102  104  107   CO2 22 - 32 mmol/L 23  20  18   Calcium 8.9 - 10.3 mg/dL 9.3  9.0  9.4     12 lead EKG: A flutter  CXR: No PNA  Assessment and Plan: New onset A-flutter/A-fib with RVR Admit to progressive care See above documentation-spoke with cardiology-Dr. Acharya-recommendations were to continue oral beta-blocker and start low-dose Cardizem  infusion.  She will also be started on IV heparin -she has no history of prior GI bleed or any sort of bleeding issues in the past TSH is stable Echocardiogram will be obtained Cardiology will need to be notified when patient arrives at [facility].  New onset HFpEF versus HFrEF She appears relatively stable-not lying flat-has some edema but not very impressive during my exam.  Palpation-her legs were more swollen last week We will hold off on diuresis for now-as she seems relatively stable-lying flat-not short of breath-on room air.  She has been started on Cardizem  infusion-and will need some room for her blood pressure as well. Echocardiogram ordered Strict intake/output/daily weights Cardiology evaluation when patient arrives at [facility].  HTN Continue beta-blocker Hold other antihypertensive agents Follow trend and adjust accordingly  DM-2 (A1c 8.0 on 08/02/2024) Lantus  40 units daily Add SSI Stop glipizide  on discharge-due to its potential to cause fluid accumulation Follow/optimize over the next several days  Idiopathic pulmonary fibrosis Being followed closely in the outpatient setting by Dr. Alva-currently not on any medications  GERD PPI  Hypothyroidism Continue usual outpatient regimen   Advance Care Planning:   Code Status: Full Code   Consults: Cardiology  Family Communication: None  Severity of Illness: The appropriate patient status for this patient is INPATIENT. Inpatient status is judged to be reasonable and necessary in order to  provide the required intensity of service to ensure the patient's safety. The patient's presenting symptoms, physical exam findings, and initial radiographic and laboratory data in the context of their chronic comorbidities is felt to place them at high risk for further clinical deterioration. Furthermore, it is not anticipated that the patient will be medically stable for discharge from the hospital within 2 midnights of admission.   * I certify that at the point of admission it is my clinical judgment that the patient will require inpatient hospital care spanning beyond 2 midnights from the point of admission due to high intensity of service, high risk for further deterioration and high frequency of surveillance required.*  Author: Donalda Applebaum, MD 10/22/2024 3:33 PM  For on call review [email or web address].      [1]  Allergies Allergen Reactions   Advair Diskus [Fluticasone -Salmeterol] Anaphylaxis   Codeine Nausea And Vomiting   Hydrocodone  Nausea And Vomiting   Levothyroxine  Other (See Comments)    Caused lower leg swelling   Oxycodone  Nausea And Vomiting   Statins     Muscle and joint pain   Tetracyclines & Related Itching    Internal   "

## 2024-10-22 NOTE — ED Notes (Signed)
 ED Provider at bedside.

## 2024-10-22 NOTE — Progress Notes (Signed)
 PHARMACY - ANTICOAGULATION CONSULT NOTE  Pharmacy Consult for heparin  Indication: atrial fibrillation  Allergies[1]  Patient Measurements:    Vital Signs: Temp: 98 F (36.7 C) (01/23 1254) Temp Source: Oral (01/23 1254) BP: 126/73 (01/23 1445) Pulse Rate: 136 (01/23 1445)  Labs: Recent Labs    10/22/24 1301  HGB 13.7  HCT 43.5  PLT 243  CREATININE 1.00    Estimated Creatinine Clearance: 50 mL/min (by C-G formula based on SCr of 1 mg/dL).   Medical History: Past Medical History:  Diagnosis Date   Allergy    Seasonal   Arthritis    Asthma    takes Singulair  nightly   Diabetes mellitus    takes levemir  daily and Metformin  daily   Generalized headaches    GERD (gastroesophageal reflux disease)    takes Omeprazole  daily   Herniated disc, cervical    History of kidney stones    Hyperlipidemia    takes Welchol  daily   Hypertension    takes Enalapril  dialy   Hypothyroidism    takes Synthroid  daily   IBS (irritable bowel syndrome)    IPF (idiopathic pulmonary fibrosis) (HCC) 2024   Joint pain    hands and neck   Nocturia    PONV (postoperative nausea and vomiting)    TIA (transient ischemic attack)    diagnosed by EEG but never knew she had it   Trigger finger    Weakness    Weakness    in both hands    Medications:  Infusions:   diltiazem  (CARDIZEM ) infusion     heparin       Assessment: 73 yof presented to the ED with new aflutter. To start IV heparin . Baseline CBC is WNL and she is not on anticoagulation PTA.   Goal of Therapy:  Heparin  level 0.3-0.7 units/ml Monitor platelets by anticoagulation protocol: Yes   Plan:  Heparin  bolus 4000 units IV x 1 Heparin  gtt 1000 units/hr Check an 8 hr heparin  level Daily heparin  level and CBC  [name], Vernell Helling 10/22/2024,3:15 PM      [1]  Allergies Allergen Reactions   Advair Diskus [Fluticasone -Salmeterol] Anaphylaxis   Codeine Nausea And Vomiting   Hydrocodone  Nausea And Vomiting    Levothyroxine  Other (See Comments)    Caused lower leg swelling   Oxycodone  Nausea And Vomiting   Statins     Muscle and joint pain   Tetracyclines & Related Itching    Internal

## 2024-10-22 NOTE — ED Notes (Signed)
 [facility] at bedside

## 2024-10-22 NOTE — Discharge Instructions (Signed)
 Look into [facility] to check your heart rhythm at home. We are stopping amlodipine , which may make your blood pressure higher than before. I expect you will be able to continue the full tablet of metoprolol  twice a day, but if you have dizziness/lightheadedness and your blood pressure is running low, it is ok to cut the pill and take 1/2 pill twice a day as you were doing before. ~~~~~~~~~~~~~~~~~~~~~~~~~~~~~~~~~~~~~~~~~~~~~~~~~~~~~~~~~~~~~~~~~~~~~~~~~~~~~~~~~~~~ Information on my medicine - ELIQUIS  (apixaban )  Why was Eliquis  prescribed for you? Eliquis  was prescribed for you to reduce the risk of forming blood clots that can cause a stroke if you have a medical condition called atrial fibrillation (a type of irregular heartbeat) OR to reduce the risk of a blood clots forming after orthopedic surgery.  What do You need to know about Eliquis  ? Take your Eliquis  TWICE DAILY - one tablet in the morning and one tablet in the evening with or without food.  It would be best to take the doses about the same time each day.  If you have difficulty swallowing the tablet whole please discuss with your pharmacist how to take the medication safely.  Take Eliquis  exactly as prescribed by your doctor and DO NOT stop taking Eliquis  without talking to the doctor who prescribed the medication.  Stopping may increase your risk of developing a new clot or stroke.  Refill your prescription before you run out.  After discharge, you should have regular check-up appointments with your healthcare provider that is prescribing your Eliquis .  In the future your dose may need to be changed if your kidney function or weight changes by a significant amount or as you get older.  What do you do if you miss a dose? If you miss a dose, take it as soon as you remember on the same day and resume taking twice daily.  Do not take more than one dose of ELIQUIS  at the same time.  Important Safety Information A possible  side effect of Eliquis  is bleeding. You should call your healthcare provider right away if you experience any of the following: Bleeding from an injury or your nose that does not stop. Unusual colored urine (red or dark brown) or unusual colored stools (red or black). Unusual bruising for unknown reasons. A serious fall or if you hit your head (even if there is no bleeding).  Some medicines may interact with Eliquis  and might increase your risk of bleeding or clotting while on Eliquis . To help avoid this, consult your healthcare provider or pharmacist prior to using any new prescription or non-prescription medications, including herbals, vitamins, non-steroidal anti-inflammatory drugs (NSAIDs) and supplements.  This website has more information on Eliquis  (apixaban ): http://www.eliquis .com/eliquis dena  Social Connections Resources   Active Adults Program https://www.[place].gov/departments/parks-recreation/active-adults-50  -Adding Health to Our Years Courses  -Aquatics  -Exercise Classes: Yoga, Dance, [place], etc.  -Book Club, Games  Locations vary by activity - Main Contact # [phone]) - see calendars on website listed above.  Senior Centers -[facility] [address] [place], [place] [address] / [phone] ext [phone] -[facility] [address] [place], [place] [address] / [phone] [facility]) [address], [place], [place] [address] / [phone] [facility] locations in [place], [place], and [place] / [phone] -[facility] [address], [place] [address] / [phone]  [facility] Elder[facility] ([facility]) [address], [place], [place] [address] - Office #: [phone] -  Enrollment Phone #: [phone]  [facility] Line  Call toll free, available 24 hours a day, [phone]   [place] [address]  [place]  2-1-1 is another useful way to locate resources in the community. Visit shedsizes.ch to find service information online. If you need additional assistance, 2-1-1 Referral Specialists are available 24 hours a day, every day by dialing 2-1-1 or [phone] from any phone. The call is free, confidential, and available in any language.

## 2024-10-22 NOTE — Plan of Care (Signed)

## 2024-10-22 NOTE — ED Notes (Signed)
 Called [facility] to transport the Patient to [place] [facility] rm# 2

## 2024-10-22 NOTE — Consult Note (Signed)
 "  Cardiology Consultation   Patient ID: [name] MRN: [id number]; DOB: [birth year]  Admit date: 10/22/2024 Date of Consult: 10/22/2024  PCP:  Tobie Suzzane POUR, MD   [facility] Providers Cardiologist:  None        Patient Profile: [name] is a 74 y.o. female with a hx of ILD and HTN, DM2 who is being seen 10/22/2024 for the evaluation of Atrial flutter at the request of EDP.  History of Present Illness: Ms. Karle presented to her outpatient pulmonary appointment today and was noted to be in atrial flutter.  This is new onset.  She does have a history of ILD not on home O2.  She feels this has been going on for several weeks and notices exertional shortness of breath.  No chest pain.  She has previously been evaluated by cardiology for chest pain, and has previously noticed DOE.  She had a low risk nuclear stress test in 2022 and was noted to have a hypertensive response to exercise.  She is mildly hypertensive here on presentation.  I had been called about the patient and advised to start diltiazem  infusion as well as oral metoprolol .  Just prior to seeing the patient I was contacted by the patient's nurse and we increased her metoprolol  to 50 mg 3 times daily.  Troponins are negative, BNP elevated.  Patient received 1 dose of IV Lasix  in the ED for elevated BNP.   Past Medical History:  Diagnosis Date   Allergy    Seasonal   Arthritis    Asthma    takes Singulair  nightly   Diabetes mellitus    takes levemir  daily and Metformin  daily   Generalized headaches    GERD (gastroesophageal reflux disease)    takes Omeprazole  daily   Herniated disc, cervical    History of kidney stones    Hyperlipidemia    takes Welchol  daily   Hypertension    takes Enalapril  dialy   Hypothyroidism    takes Synthroid  daily   IBS (irritable bowel syndrome)    IPF (idiopathic pulmonary fibrosis) (HCC) 2024   Joint pain    hands and neck   Nocturia    PONV  (postoperative nausea and vomiting)    TIA (transient ischemic attack)    diagnosed by EEG but never knew she had it   Trigger finger    Weakness    Weakness    in both hands    Past Surgical History:  Procedure Laterality Date   ABDOMINAL HYSTERECTOMY  1980   ANTERIOR CERVICAL DECOMP/DISCECTOMY FUSION N/A 10/18/2013   Procedure: Cervical three-four, Cervical four-five, Cervical seven-Thoracic one anterior cervical decompression with fusion plating and bonegraft;  Surgeon: Lamar LELON Peaches, MD;  Location: [facility];  Service: Neurosurgery;  Laterality: N/A;  Cervical three-four, Cervical four-five, Cervical seven-Thoracic one anterior cervical decompression with fusion plating and bonegraft   APPENDECTOMY     done with hysterectomy   BACK SURGERY  2007   lumbar fusion   BIOPSY  03/19/2023   Procedure: BIOPSY;  Surgeon: Eartha Flavors, Toribio, MD;  Location: [facility];  Service: Gastroenterology;;   carpel tunnel Bilateral    left first then right   cataract implants     CHOLECYSTECTOMY N/A 11/22/2021   Procedure: LAPAROSCOPIC CHOLECYSTECTOMY;  Surgeon: Evonnie Dorothyann LABOR, DO;  Location: [facility];  Service: General;  Laterality: N/A;   COLONOSCOPY     COLONOSCOPY WITH PROPOFOL  N/A 11/27/2022   Procedure: COLONOSCOPY WITH  PROPOFOL ;  Surgeon: Eartha Angelia Sieving, MD;  Location: [facility];  Service: Gastroenterology;  Laterality: N/A;  10:00am, asa 1-2   CYSTOSCOPY WITH RETROGRADE PYELOGRAM, URETEROSCOPY AND STENT PLACEMENT Left 04/08/2022   Procedure: CYSTOSCOPY WITH RETROGRADE PYELOGRAM, URETEROSCOPY AND STENT PLACEMENT;  Surgeon: Matilda Senior, MD;  Location: [facility];  Service: Urology;  Laterality: Left;   ESOPHAGOGASTRODUODENOSCOPY     hx of h pylori   ESOPHAGOGASTRODUODENOSCOPY (EGD) WITH PROPOFOL  N/A 03/19/2023   Procedure: ESOPHAGOGASTRODUODENOSCOPY (EGD) WITH PROPOFOL ;  Surgeon: Eartha Angelia Sieving, MD;  Location: [facility];  Service: Gastroenterology;  Laterality: N/A;  10:30am;asa 2   EYE SURGERY  11/18/2008   right - cataracts   EYE SURGERY  06/01/2009   left - cataracts   herniated disc   10/2000, 08/31/2008   C5-C6 then C6-C7   HOLMIUM LASER APPLICATION Left 04/08/2022   Procedure: HOLMIUM LASER APPLICATION;  Surgeon: Matilda Senior, MD;  Location: [facility];  Service: Urology;  Laterality: Left;   IR URETERAL STENT RIGHT NEW ACCESS W/O SEP NEPHROSTOMY CATH  07/26/2021   knee anthroscopy bilateral Bilateral    KNEE ARTHROSCOPY     LITHOTRIPSY  11/12/2007   right side   NEPHROLITHOTOMY Right 07/26/2021   Procedure: NEPHROLITHOTOMY PERCUTANEOUS;  Surgeon: Matilda Senior, MD;  Location: [facility];  Service: Urology;  Laterality: Right;   Parathyroid  surgery     October 2013   PARATHYROIDECTOMY  07/02/2012   Procedure: PARATHYROIDECTOMY;  Surgeon: Krystal CHRISTELLA Spinner, MD;  Location: [facility];  Service: General;  Laterality: N/A;   SPINE SURGERY     TONSILLECTOMY     as a child     Home Medications:  Prior to Admission medications  Medication Sig Start Date End Date Taking? Authorizing Provider  acetaminophen  (TYLENOL ) 500 MG tablet Take 500 mg by mouth every 6 (six) hours as needed for mild pain (pain score 1-3).   Yes [provider]  albuterol  (VENTOLIN  HFA) 108 (90 Base) MCG/ACT inhaler Inhale 2 puffs into the lungs every 6 (six) hours as needed for wheezing or shortness of breath. 01/09/24  Yes Parrett, Tammy S, NP  amLODipine  (NORVASC ) 5 MG tablet TAKE 1 TABLET BY MOUTH EVERY DAY 09/20/24  Yes Tobie Suzzane POUR, MD  fluticasone  (FLONASE ) 50 MCG/ACT nasal spray SHAKE LIQUID AND USE 2 SPRAYS IN EACH NOSTRIL DAILY Patient taking differently: Place 1 spray into both nostrils daily as needed for allergies. 10/22/24  Yes Tobie Suzzane POUR, MD  glipiZIDE  (GLUCOTROL  XL) 2.5 MG 24 hr tablet Take 1 tablet (2.5 mg total) by mouth daily with breakfast. 08/02/24  Yes Therisa Benton PARAS, NP   insulin  glargine (LANTUS ) 100 unit/mL SOPN Inject 40 Units into the skin at bedtime. 08/02/24  Yes Reardon, Benton PARAS, NP  metoprolol  tartrate (LOPRESSOR ) 25 MG tablet TAKE 1 TABLET(25 MG) BY MOUTH TWICE DAILY Patient taking differently: Take 12.5 mg by mouth 2 (two) times daily. 01/12/24  Yes Tobie Suzzane POUR, MD  pantoprazole  (PROTONIX ) 40 MG tablet TAKE 1 TABLET(40 MG) BY MOUTH TWICE DAILY Patient taking differently: Take 40 mg by mouth daily. 06/17/24  Yes Tobie Suzzane POUR, MD  thyroid  (NP THYROID ) 60 MG tablet TAKE 1 TABLET BY MOUTH EVERY DAY BEFORE BREAKFAST 03/19/24  Yes Reardon, Benton J, NP  Vitamin D , Ergocalciferol , (DRISDOL ) 1.25 MG (50000 UNIT) CAPS capsule Take 1 capsule (50,000 Units total) by mouth every 7 (seven) days. 03/05/24  Yes Therisa Benton PARAS, NP  Accu-Chek Softclix Lancets lancets  Use as instructed to monitor glucose twice daily. 08/31/24   Therisa Benton PARAS, NP  blood glucose meter kit and supplies KIT Dispense based on patient and insurance preference. Use twice daily (before breakfast and before bed). 07/30/21   Therisa Benton PARAS, NP  glucose blood (ACCU-CHEK GUIDE TEST) test strip Use as instructed to monitor glucose twice daily. 12/15/23   Therisa Benton PARAS, NP  Insulin  Pen Needle (PEN NEEDLES) 31G X 6 MM MISC Use to inject insulin  once daily 06/10/23   Therisa Benton PARAS, NP    Scheduled Meds:  fluticasone   2 spray Each Nare Daily   furosemide   20 mg Intravenous Once   [START ON 10/23/2024] insulin  aspart  0-9 Units Subcutaneous TID WC   insulin  aspart  0-9 Units Subcutaneous TID WC   insulin  glargine  40 Units Subcutaneous QHS   metoprolol  tartrate  50 mg Oral TID   multivitamin with minerals  1 tablet Oral Daily   pantoprazole   40 mg Oral Daily   sodium chloride  flush  3 mL Intravenous Q12H   [START ON 10/23/2024] thyroid   60 mg Oral QAC breakfast   Continuous Infusions:  sodium chloride      diltiazem  (CARDIZEM ) infusion 15 mg/hr (10/22/24 1739)   heparin  1,000  Units/hr (10/22/24 1613)   PRN Meds: sodium chloride , acetaminophen  **OR** acetaminophen , albuterol , ondansetron  **OR** ondansetron  (ZOFRAN ) IV, sodium chloride  flush  Allergies:   Allergies[1]  Social History:   Social History   Socioeconomic History   Marital status: Widowed    Spouse name: Not on file   Number of children: Not on file   Years of education: Not on file   Highest education level: GED or equivalent  Occupational History   Not on file  Tobacco Use   Smoking status: Former    Current packs/day: 0.00    Average packs/day: 1 pack/day for 40.0 years (40.0 ttl pk-yrs)    Types: Cigarettes    Start date: 07/19/1972    Quit date: 07/19/2012    Years since quitting: 12.2    Passive exposure: Past   Smokeless tobacco: Never   Tobacco comments:    quit smoking in 25-Jul-2012 Vaping Use   Vaping status: Never Used  Substance and Sexual Activity   Alcohol use: Not Currently    Comment: Very very rarely. Glass of wine or 1 mixed drink   Drug use: Never   Sexual activity: Not Currently    Birth control/protection: Surgical  Other Topics Concern   Not on file  Social History Narrative   Widow,husband died 26-Oct-2020after 35 years marriage.Retired.   Social Drivers of Health   Tobacco Use: Medium Risk (10/22/2024)   Patient History    Smoking Tobacco Use: Former    Smokeless Tobacco Use: Never    Passive Exposure: Past  Physicist, Medical Strain: Low Risk (09/04/2024)   Overall Financial Resource Strain (CARDIA)    Difficulty of Paying Living Expenses: Not hard at all  Food Insecurity: No Food Insecurity (10/22/2024)   Epic    Worried About Programme Researcher, Broadcasting/film/video in the Last Year: Never true    Ran Out of Food in the Last Year: Never true  Transportation Needs: No Transportation Needs (10/22/2024)   Epic    Lack of Transportation (Medical): No    Lack of Transportation (Non-Medical): No  Physical Activity: Inactive (10/05/2024)   Exercise Vital Sign    Days of  Exercise per Week: 0 days    Minutes of  Exercise per Session: 0 min  Stress: No Stress Concern Present (10/05/2024)   [employer] of Occupational Health - Occupational Stress Questionnaire    Feeling of Stress: Not at all  Recent Concern: Stress - Stress Concern Present (09/04/2024)   [employer] of Occupational Health - Occupational Stress Questionnaire    Feeling of Stress: To some extent  Social Connections: Socially Isolated (10/22/2024)   Social Connection and Isolation Panel    Frequency of Communication with Friends and Family: Once a week    Frequency of Social Gatherings with Friends and Family: Once a week    Attends Religious Services: More than 4 times per year    Active Member of [employer] or Organizations: No    Attends Banker Meetings: Never    Marital Status: Widowed  Intimate Partner Violence: Not At Risk (10/22/2024)   Epic    Fear of Current or Ex-Partner: No    Emotionally Abused: No    Physically Abused: No    Sexually Abused: No  Depression (PHQ2-9): Low Risk (10/05/2024)   Depression (PHQ2-9)    PHQ-2 Score: 0  Alcohol Screen: Low Risk (09/04/2024)   Alcohol Screen    Last Alcohol Screening Score (AUDIT): 1  Housing: Low Risk (10/22/2024)   Epic    Unable to Pay for Housing in the Last Year: No    Number of Times Moved in the Last Year: 0    Homeless in the Last Year: No  Utilities: Not At Risk (10/22/2024)   Epic    Threatened with loss of utilities: No  Health Literacy: Adequate Health Literacy (10/05/2024)   B1300 Health Literacy    Frequency of need for help with medical instructions: Never    Family History:    Family History  Problem Relation Age of Onset   Cancer Mother        pancreatic     ROS:  Please see the history of present illness.   All other ROS reviewed and negative.     Physical Exam/Data: Vitals:   10/22/24 1615 10/22/24 1630 10/22/24 1645 10/22/24 1740  BP: (!) 125/95 (!) 151/103 121/81 (!) 151/98  Pulse:  (!) 140 (!) 140 (!) 140 (!) 148  Resp: (!) 23 20 18 18   Temp:  98 F (36.7 C)  98 F (36.7 C)  TempSrc:    Oral  SpO2: 95% 96% 98% 98%   No intake or output data in the 24 hours ending 10/22/24 1910    10/22/2024   11:57 AM 10/05/2024   11:51 AM 08/02/2024   10:29 AM  Last 3 Weights  Weight (lbs) 163 lb 152 lb 152 lb 12.8 oz  Weight (kg) 73.936 kg 68.947 kg 69.31 kg     There is no height or weight on file to calculate BMI.  General:  Well nourished, well developed, in no acute distress HEENT: normal Neck: no JVD Vascular: No carotid bruits; Distal pulses 2+ bilaterally Cardiac:  normal S1, S2; regular and tachycardic; no murmur  Lungs:  clear to auscultation bilaterally, no wheezing, rhonchi or rales  Abd: soft, nontender, no hepatomegaly  Ext: no edema Musculoskeletal:  No deformities, BUE and BLE strength normal and equal Skin: warm and dry  Neuro:  CNs 2-12 intact, no focal abnormalities noted Psych:  Normal affect   EKG:  The EKG was personally reviewed and demonstrates:  aflutter rate 141 Telemetry:  Telemetry was personally reviewed and demonstrates:  aflutter rates 145  Relevant CV Studies:  Laboratory Data: High Sensitivity Troponin:  No results for input(s): TROPONINIHS in the last 720 hours.  Recent Labs  Lab 10/22/24 1301 10/22/24 1537  TRNPT 8 8      Chemistry Recent Labs  Lab 10/22/24 1301  NA 139  K 3.9  CL 102  CO2 23  GLUCOSE 255*  BUN 20  CREATININE 1.00  CALCIUM 9.3  MG 2.0  GFRNONAA 58*  ANIONGAP 14    Recent Labs  Lab 10/22/24 1301  PROT 6.7  ALBUMIN 4.6  AST 23  ALT 17  ALKPHOS 82  BILITOT 0.6   Lipids No results for input(s): CHOL, TRIG, HDL, LABVLDL, LDLCALC, CHOLHDL in the last 168 hours.  Hematology Recent Labs  Lab 10/22/24 1301  WBC 6.7  RBC 5.40*  HGB 13.7  HCT 43.5  MCV 80.6  MCH 25.4*  MCHC 31.5  RDW 14.6  PLT 243   Thyroid   Recent Labs  Lab 10/22/24 1301  TSH 1.940    BNP Recent  Labs  Lab 10/22/24 1301  PROBNP 1,319.0*    DDimer No results for input(s): DDIMER in the last 168 hours.  Radiology/Studies:  DG Chest Portable 1 View Result Date: 10/22/2024 EXAM: 1 VIEW(S) XRAY OF THE CHEST 10/22/2024 01:11:29 PM COMPARISON: 11/13/2023 CLINICAL HISTORY: Shortness of breath. FINDINGS: LUNGS AND PLEURA: Linear subsegmental atelectasis or scarring along both lung bases. No pleural effusion. No pneumothorax. HEART AND MEDIASTINUM: Atheromatous vascular calcification of the aortic arch. No acute abnormality of the cardiac silhouette. The mediastinal silhouette is otherwise unremarkable. BONES AND SOFT TISSUES: ACDF hardware in lower cervical spine. No acute osseous abnormality. Cholecystectomy clips are noted in the upper abdomen, outside the visualized chest. IMPRESSION: 1. Linear subsegmental atelectasis or scarring at both lung bases. 2. Atherosclerotic calcification of the aortic arch. 3. Lower cervical anterior cervical discectomy and fusion hardware. Electronically signed by: Ryan Salvage MD 10/22/2024 01:33 PM EST RP Workstation: [id number]     Assessment and Plan: Aflutter 2:1 conduction RVR DOE - on IV dilt - increase metoprolol  to 50 mg TID - start eliquis  5 mg bid, stop heparin . Per pharmacy - echo in am -likely TEE/DCCV Monday or Tuesday unless rate controlled or converts.  - no chest pain, no pleuritic pain. Prior normal stress test 2022. Trops negative. - check tsh - bnp up, no evidence of HF. Primary team has ordered lasix  x 1.   HTN - hypertensive, titrating rate control for now  IPF - will eval for PHTN or RH dysfunction on echo. Lungs clear today.   Hypothyroidism -check tsh - meds per primary. [place] thyroid .  DM2 - insulin  per primary.    Risk Assessment/Risk Scores:         CHA2DS2-VASc Score = 5   This indicates a 7.2% annual risk of stroke. The patient's score is based upon: CHF History: 0 HTN History: 1 Diabetes History:  1 Stroke History: 0 Vascular Disease History: 0 Age Score: 2 Gender Score: 1        For questions or updates, please contact [facility] Please consult [email or web address] for contact info under      Signed, [name], MD  10/22/2024 7:10 PM     [1]  Allergies Allergen Reactions   Advair Diskus [Fluticasone -Salmeterol] Anaphylaxis   Codeine Nausea And Vomiting   Hydrocodone  Nausea And Vomiting   Levothyroxine  Other (See Comments)    Caused lower leg swelling   Oxycodone  Nausea And Vomiting   Statins     Muscle and  joint pain   Tetracyclines & Related Itching    Internal   "

## 2024-10-22 NOTE — Progress Notes (Signed)
 PHARMACY - ANTICOAGULATION CONSULT NOTE  Pharmacy Consult for heparin > Eliquis  Indication: atrial fibrillation  Allergies[1]  Patient Measurements:    Vital Signs: Temp: 98 F (36.7 C) (01/23 1740) Temp Source: Oral (01/23 1740) BP: 151/98 (01/23 1740) Pulse Rate: 148 (01/23 1740)  Labs: Recent Labs    10/22/24 1301  HGB 13.7  HCT 43.5  PLT 243  CREATININE 1.00    Estimated Creatinine Clearance: 50 mL/min (by C-G formula based on SCr of 1 mg/dL).   Medical History: Past Medical History:  Diagnosis Date   Allergy    Seasonal   Arthritis    Asthma    takes Singulair  nightly   Diabetes mellitus    takes levemir  daily and Metformin  daily   Generalized headaches    GERD (gastroesophageal reflux disease)    takes Omeprazole  daily   Herniated disc, cervical    History of kidney stones    Hyperlipidemia    takes Welchol  daily   Hypertension    takes Enalapril  dialy   Hypothyroidism    takes Synthroid  daily   IBS (irritable bowel syndrome)    IPF (idiopathic pulmonary fibrosis) (HCC) 2024   Joint pain    hands and neck   Nocturia    PONV (postoperative nausea and vomiting)    TIA (transient ischemic attack)    diagnosed by EEG but never knew she had it   Trigger finger    Weakness    Weakness    in both hands    Medications: see MAR  Assessment: 73 yof presented to the ED with new aflutter. To start IV heparin. Baseline CBC is WNL and she is not on anticoagulation PTA.   Now consulted to transition to Eliquis.    Plan:  STOP heparin infusion GIVE Eliquis 5 mg at time of heparin gtt cessation, then continue 5 mg BID F/u copay check on Monday, education before discharge  Hillary Schwegler U Paytes 10/22/2024,7:17 PM       [1]  Allergies Allergen Reactions   Advair Diskus [Fluticasone -Salmeterol] Anaphylaxis   Codeine Nausea And Vomiting   Hydrocodone  Nausea And Vomiting   Levothyroxine  Other (See Comments)    Caused lower leg swelling   Oxycodone   Nausea And Vomiting   Statins     Muscle and joint pain   Tetracyclines & Related Itching    Internal

## 2024-10-22 NOTE — Progress Notes (Signed)
 "  Subjective:    Patient ID: [name], female    DOB: 1951/08/20, 74 y.o.   MRN: [id number]    74 yo remote smoker for FU of ILD incidentally detected on screening CT scan.    PMH -hypertension Parathyroidectomy Diabetes type 2 Asthma She smoked about 20 pack years before she quit 2013   Initial OV 11/2022 >>On ambulation oxygen saturation stayed at 98% after 3 laps She prefers to not start medication at this time after much discussion.     Discussed the use of AI scribe software for clinical note transcription with the patient, who gave verbal consent to proceed.  History of Present Illness [name] is a 74 year old female with hypertension who presents with dizziness, shortness of breath, and weight gain.  She has had intermittent dizziness and shortness of breath for 2 to 3 weeks, along with rapid weight gain of about 13 pounds from 150 to 163 pounds, which she suspects is fluid retention.  She initially thought the shortness of breath was from allergies but is now concerned about her heart. A lung scan in October was normal. She has not had pulmonary function testing since then.  She takes metoprolol  12.5 mg twice daily, started about a year ago for hypertension. She previously saw a cardiologist for strong family history of heart disease but is not currently under cardiology care.  Over the past 2 weeks she becomes easily short of breath and has limb aches with dancing, which now limits her usual activity.  Her family history is notable for extensive heart disease. All of her mother's siblings died from heart attacks, and her mother had two open-heart surgeries before dying from cancer.    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 Also denies presyncope, palpitations, heartburn, abdominal pain, nausea, vomiting, diarrhea or change in bowel or urinary habits, dysuria,hematuria, rash, arthralgias, visual complaints, headache, numbness weakness or ataxia.      Objective:   Physical Exam  Gen. Pleasant, well-nourished, in no distress ENT - no thrush, no pallor/icterus,no post nasal drip Neck: No JVD, no thyromegaly, no carotid bruits Lungs: no use of accessory muscles, no dullness to percussion, clear without rales or rhonchi  Cardiovascular: S1s2 tachy , no murmurs or gallops, 1+ peripheral edema Musculoskeletal: No deformities, no cyanosis or clubbing        Assessment & Plan:   Assessment and Plan Assessment & Plan Atrial flutter EKG confirms atrial flutter with variable block. Heart rate is elevated at 140 bpm. Symptoms include dizziness and shortness of breath, likely related to cardiac issues rather than pulmonary. - Sent to emergency room for further evaluation  Systolic heart failure Concern for systolic heart failure due to peripheral edema  and a 10-pound weight gain. Symptoms include shortness of breath and fluid retention, ongoing for 2-3 weeks. Family history of heart disease noted. - Sent to emergency room for further evaluation I spoke to triage nurse in ED  Interstitial lung disease Detected incidentally.  Previous lung scan in October showed no significant findings. Decision to hold off on treatment due to potential side effects of medications. - Will schedule follow-up lung scan in October 2025  Hypertension Managed with metoprolol , currently taking 12.5 mg twice daily. Blood pressure control may be contributing to current symptoms. - Monitor blood pressure closely     "

## 2024-10-22 NOTE — Brief Op Note (Addendum)
 Patient was virtual admission. She was admitted for New onset A-flutter/A-fib with RVR and New onset Heart failure. Case was discussed with cardiology by my colleague who did  admission. Cardiology recommended diltiazem  drip and oral metoprolol . Echocardiogram pending. Patient was seen at bedside. She was awake and alert. She stated to feel fine. Denies chest pain, no nausea, no vomiting. She does have mild sob  Brief exam: HHENT: head atraumatic  Neck supple Resp: decrease breath sound No wheezes. +rales CVS ; s1/s2 + No JVD  ABD: soft, +BS  No tender  Ext: trace edema  Neuro: A&Ox3 No focal neurology deficit  Psych: Moods and affect appropriate Ass/p:  A.fib with RVR/? Flutter Her heart rate is still in 140 Continue to titrate diltiazem  drip. Nurse just gave her oral metoprolol . Continue Heparin  drip Echocardiogram pending Further input by cardiology  2. New onset HFrEF vs HFpEF  Echocardiogram pending  BNP 1319.she does have some edema. Will give her lasix  20 mg IV x 1 dose  Chest x-ray > no pleural effusion 3. Hypertension  Continue Metoprolol  and diltiazem  4. Type II diabetes  Continue sliding scale and Lantus   5. Complete History and Orders refer to H&P done by Dr. Raenelle , Donalda

## 2024-10-23 ENCOUNTER — Other Ambulatory Visit: Payer: Self-pay

## 2024-10-23 ENCOUNTER — Inpatient Hospital Stay (HOSPITAL_COMMUNITY)

## 2024-10-23 DIAGNOSIS — I483 Typical atrial flutter: Secondary | ICD-10-CM | POA: Diagnosis not present

## 2024-10-23 DIAGNOSIS — I4891 Unspecified atrial fibrillation: Secondary | ICD-10-CM | POA: Diagnosis not present

## 2024-10-23 LAB — COMPREHENSIVE METABOLIC PANEL WITH GFR
ALT: 16 U/L (ref 0–44)
AST: 16 U/L (ref 15–41)
Albumin: 4 g/dL (ref 3.5–5.0)
Alkaline Phosphatase: 67 U/L (ref 38–126)
Anion gap: 11 (ref 5–15)
BUN: 26 mg/dL — ABNORMAL HIGH (ref 8–23)
CO2: 22 mmol/L (ref 22–32)
Calcium: 9.3 mg/dL (ref 8.9–10.3)
Chloride: 103 mmol/L (ref 98–111)
Creatinine, Ser: 1.29 mg/dL — ABNORMAL HIGH (ref 0.44–1.00)
GFR, Estimated: 43 mL/min — ABNORMAL LOW
Glucose, Bld: 201 mg/dL — ABNORMAL HIGH (ref 70–99)
Potassium: 4.3 mmol/L (ref 3.5–5.1)
Sodium: 136 mmol/L (ref 135–145)
Total Bilirubin: 0.5 mg/dL (ref 0.0–1.2)
Total Protein: 6 g/dL — ABNORMAL LOW (ref 6.5–8.1)

## 2024-10-23 LAB — HEMOGLOBIN A1C
Hgb A1c MFr Bld: 7.6 % — ABNORMAL HIGH (ref 4.8–5.6)
Mean Plasma Glucose: 171.42 mg/dL

## 2024-10-23 LAB — CBC
HCT: 39.5 % (ref 36.0–46.0)
Hemoglobin: 12.4 g/dL (ref 12.0–15.0)
MCH: 25.3 pg — ABNORMAL LOW (ref 26.0–34.0)
MCHC: 31.4 g/dL (ref 30.0–36.0)
MCV: 80.4 fL (ref 80.0–100.0)
Platelets: 229 10*3/uL (ref 150–400)
RBC: 4.91 MIL/uL (ref 3.87–5.11)
RDW: 14.6 % (ref 11.5–15.5)
WBC: 8 10*3/uL (ref 4.0–10.5)
nRBC: 0 % (ref 0.0–0.2)

## 2024-10-23 LAB — ECHOCARDIOGRAM COMPLETE
AR max vel: 1.87 cm2
AV Area VTI: 1.94 cm2
AV Area mean vel: 1.82 cm2
AV Mean grad: 3 mmHg
AV Peak grad: 6.4 mmHg
Ao pk vel: 1.26 m/s
Calc EF: 67.2 %
Height: 66 in
S' Lateral: 2.7 cm
Single Plane A2C EF: 67.8 %
Single Plane A4C EF: 67 %
Weight: 2566.4 [oz_av]

## 2024-10-23 LAB — GLUCOSE, CAPILLARY
Glucose-Capillary: 167 mg/dL — ABNORMAL HIGH (ref 70–99)
Glucose-Capillary: 191 mg/dL — ABNORMAL HIGH (ref 70–99)
Glucose-Capillary: 181 mg/dL — ABNORMAL HIGH (ref 70–99)
Glucose-Capillary: 156 mg/dL — ABNORMAL HIGH (ref 70–99)

## 2024-10-23 LAB — MAGNESIUM: Magnesium: 1.8 mg/dL (ref 1.7–2.4)

## 2024-10-23 MED ORDER — ALUM & MAG HYDROXIDE-SIMETH 200-200-20 MG/5ML PO SUSP
30.0000 mL | ORAL | Status: DC | PRN
  Administered 2024-10-23: 30 mL via ORAL
  Filled 2024-10-23: qty 30

## 2024-10-23 MED ORDER — DIGOXIN 0.25 MG/ML IJ SOLN
0.2500 mg | Freq: Three times a day (TID) | INTRAMUSCULAR | Status: AC
  Administered 2024-10-23 – 2024-10-24 (×4): 0.25 mg via INTRAVENOUS
  Filled 2024-10-23 (×4): qty 1

## 2024-10-23 MED ORDER — METOPROLOL TARTRATE 25 MG PO TABS: 25.0000 mg | ORAL_TABLET | Freq: Three times a day (TID) | ORAL | Status: DC

## 2024-10-23 MED ORDER — INSULIN GLARGINE 100 UNIT/ML ~~LOC~~ SOLN
42.0000 [IU] | Freq: Every day | SUBCUTANEOUS | Status: DC
  Administered 2024-10-23 – 2024-10-25 (×3): 42 [IU] via SUBCUTANEOUS
  Filled 2024-10-23 (×4): qty 0.42

## 2024-10-23 MED ORDER — LACTATED RINGERS IV BOLUS
500.0000 mL | Freq: Once | INTRAVENOUS | Status: AC
  Administered 2024-10-23: 500 mL via INTRAVENOUS

## 2024-10-23 MED ORDER — MELATONIN 3 MG PO TABS
3.0000 mg | ORAL_TABLET | Freq: Every evening | ORAL | Status: DC | PRN
  Administered 2024-10-23 – 2024-10-25 (×3): 3 mg via ORAL
  Filled 2024-10-23 (×3): qty 1

## 2024-10-23 NOTE — Progress Notes (Signed)
 [facility] night cross cover note:   I was notified by the patient's RN that this patient, who is admitted yesterday as a transfer from [facility] with new diagnosis of atrial flutter with RVR, is experiencing some soft blood pressures, with systolic values in the 80s while on diltiazem  drip, with associated improvement in heart rate from her initial sustained rates in the 140s to most recent heart rates in the 60s.  Patient notes some mild indigestion at this time, but is otherwise asymptomatic.  Updated EKG continued to show atrial flutter.  EDP at the med center had discussed patient's case with on-call cardiology, Dr. Loni, who recommended hospitalist admission to [facility], diltiazem  drip, and conveyed that cardiology will consult.   I subsequently contacted on-call cardiology, Dr. Hillary to let cardiology know that the patient had arrived at [facility].  I provided Dr. Hillary with the updated vital signs, at which time he recommended holding of diltiazem  drip, consideration for expansion of sustaining order for metoprolol  to tartrate 50 mg p.o. 3 times daily every 6 hour dosing, and can be consideration for small IV fluid bolus for BP support.   Subsequently, diltiazem  drip was held, and I placed order for 500 cc LR bolus.  Following these measures, systolic pressures improved to greater than 100.  However, heart rate began to escalate, with sustained heart rates now noted to be in the 130s.   Dr. Hillary amenable to resumption of diltiazem  drip at this time.    Eva Pore, DO Hospitalist

## 2024-10-23 NOTE — Consult Note (Signed)
 "  Cardiology Consultation   Patient ID: [name] MRN: [id number]; DOB: 29-Nov-1950  Admit date: 10/22/2024 Date of Consult: 10/23/2024  PCP:  Brandi Suzzane POUR, MD   [facility] Providers Cardiologist:  None        Patient Profile: [name] is a 74 y.o. female with a hx of ILD, hypertension, GERD, type 2 diabetes, hypothyroidism who is being seen 10/23/2024 for the evaluation of atrial flutter at the request of hospitalist.  History of Present Illness: [name] presented to her pulmonology appointment yesterday at which point her heart rate was found to be 140 bpm.  An ECG at that time revealed atrial flutter with 2-1 AV block.  She was then instructed to present to the emergency department for further evaluation and treatment.  After confirmation of atrial flutter she did acknowledge that she had more dyspnea above her baseline for around the past month.  This dyspnea was mostly with exertion and associated lower extremity edema.  She has had 10 pounds of weight gain in the past 3 months.  She was initially managed with IV metoprolol  in the emergency department with improvement in her rate and then subsequently initiated on Cardizem  infusion.  Although her rate improved to the 60s and atrial flutter her blood pressure was lowered to the 90s over 50s.  She last saw a Cardiologist 5 years ago for hypertension and has no recollection of any cardiac diagnoses.   Mother with CABG x2. All maternal aunts and uncles passed away due to coronary disease.   Past Medical History:  Diagnosis Date   Allergy    Seasonal   Arthritis    Asthma    takes Singulair  nightly   Diabetes mellitus    takes levemir  daily and Metformin  daily   Generalized headaches    GERD (gastroesophageal reflux disease)    takes Omeprazole  daily   Herniated disc, cervical    History of kidney stones    Hyperlipidemia    takes Welchol  daily   Hypertension    takes Enalapril  dialy    Hypothyroidism    takes Synthroid  daily   IBS (irritable bowel syndrome)    IPF (idiopathic pulmonary fibrosis) (HCC) 2024   Joint pain    hands and neck   Nocturia    PONV (postoperative nausea and vomiting)    TIA (transient ischemic attack)    diagnosed by EEG but never knew she had it   Trigger finger    Weakness    Weakness    in both hands    Past Surgical History:  Procedure Laterality Date   ABDOMINAL HYSTERECTOMY  1980   ANTERIOR CERVICAL DECOMP/DISCECTOMY FUSION N/A 10/18/2013   Procedure: Cervical three-four, Cervical four-five, Cervical seven-Thoracic one anterior cervical decompression with fusion plating and bonegraft;  Surgeon: Lamar LELON Peaches, MD;  Location: [facility];  Service: Neurosurgery;  Laterality: N/A;  Cervical three-four, Cervical four-five, Cervical seven-Thoracic one anterior cervical decompression with fusion plating and bonegraft   APPENDECTOMY     done with hysterectomy   BACK SURGERY  2007   lumbar fusion   BIOPSY  03/19/2023   Procedure: BIOPSY;  Surgeon: Eartha Flavors, Toribio, MD;  Location: [facility];  Service: Gastroenterology;;   carpel tunnel Bilateral    left first then right   cataract implants     CHOLECYSTECTOMY N/A 11/22/2021   Procedure: LAPAROSCOPIC CHOLECYSTECTOMY;  Surgeon: Evonnie Dorothyann LABOR, DO;  Location: [facility];  Service: General;  Laterality: N/A;  COLONOSCOPY     COLONOSCOPY WITH PROPOFOL  N/A 11/27/2022   Procedure: COLONOSCOPY WITH PROPOFOL ;  Surgeon: Eartha Angelia Sieving, MD;  Location: [facility];  Service: Gastroenterology;  Laterality: N/A;  10:00am, asa 1-2   CYSTOSCOPY WITH RETROGRADE PYELOGRAM, URETEROSCOPY AND STENT PLACEMENT Left 04/08/2022   Procedure: CYSTOSCOPY WITH RETROGRADE PYELOGRAM, URETEROSCOPY AND STENT PLACEMENT;  Surgeon: Matilda Senior, MD;  Location: [facility];  Service: Urology;  Laterality: Left;   ESOPHAGOGASTRODUODENOSCOPY     hx of h pylori    ESOPHAGOGASTRODUODENOSCOPY (EGD) WITH PROPOFOL  N/A 03/19/2023   Procedure: ESOPHAGOGASTRODUODENOSCOPY (EGD) WITH PROPOFOL ;  Surgeon: Eartha Angelia Sieving, MD;  Location: [facility];  Service: Gastroenterology;  Laterality: N/A;  10:30am;asa 2   EYE SURGERY  11/18/2008   right - cataracts   EYE SURGERY  06/01/2009   left - cataracts   herniated disc   10/2000, 08/31/2008   C5-C6 then C6-C7   HOLMIUM LASER APPLICATION Left 04/08/2022   Procedure: HOLMIUM LASER APPLICATION;  Surgeon: Matilda Senior, MD;  Location: [facility];  Service: Urology;  Laterality: Left;   IR URETERAL STENT RIGHT NEW ACCESS W/O SEP NEPHROSTOMY CATH  07/26/2021   knee anthroscopy bilateral Bilateral    KNEE ARTHROSCOPY     LITHOTRIPSY  11/12/2007   right side   NEPHROLITHOTOMY Right 07/26/2021   Procedure: NEPHROLITHOTOMY PERCUTANEOUS;  Surgeon: Matilda Senior, MD;  Location: [facility];  Service: Urology;  Laterality: Right;   Parathyroid  surgery     October 2013   PARATHYROIDECTOMY  07/02/2012   Procedure: PARATHYROIDECTOMY;  Surgeon: Krystal CHRISTELLA Spinner, MD;  Location: [facility];  Service: General;  Laterality: N/A;   SPINE SURGERY     TONSILLECTOMY     as a child     Home Medications:  Prior to Admission medications  Medication Sig Start Date End Date Taking? Authorizing Provider  acetaminophen  (TYLENOL ) 500 MG tablet Take 500 mg by mouth every 6 (six) hours as needed for mild pain (pain score 1-3).   Yes [provider]  albuterol  (VENTOLIN  HFA) 108 (90 Base) MCG/ACT inhaler Inhale 2 puffs into the lungs every 6 (six) hours as needed for wheezing or shortness of breath. 01/09/24  Yes Parrett, Tammy S, NP  amLODipine  (NORVASC ) 5 MG tablet TAKE 1 TABLET BY MOUTH EVERY DAY 09/20/24  Yes Brandi Suzzane POUR, MD  fluticasone  (FLONASE ) 50 MCG/ACT nasal spray SHAKE LIQUID AND USE 2 SPRAYS IN EACH NOSTRIL DAILY Patient taking differently: Place 1 spray into both nostrils daily as needed for  allergies. 10/22/24  Yes Brandi Suzzane POUR, MD  glipiZIDE  (GLUCOTROL  XL) 2.5 MG 24 hr tablet Take 1 tablet (2.5 mg total) by mouth daily with breakfast. 08/02/24  Yes [name] PARAS, NP  insulin  glargine (LANTUS ) 100 unit/mL SOPN Inject 40 Units into the skin at bedtime. 08/02/24  Yes Reardon, Allen PARAS, NP  metoprolol  tartrate (LOPRESSOR ) 25 MG tablet TAKE 1 TABLET(25 MG) BY MOUTH TWICE DAILY Patient taking differently: Take 12.5 mg by mouth 2 (two) times daily. 01/12/24  Yes Brandi Suzzane POUR, MD  pantoprazole  (PROTONIX ) 40 MG tablet TAKE 1 TABLET(40 MG) BY MOUTH TWICE DAILY Patient taking differently: Take 40 mg by mouth daily. 06/17/24  Yes Brandi Suzzane POUR, MD  thyroid  (NP THYROID ) 60 MG tablet TAKE 1 TABLET BY MOUTH EVERY DAY BEFORE BREAKFAST 03/19/24  Yes Reardon, Allen J, NP  Vitamin D , Ergocalciferol , (DRISDOL ) 1.25 MG (50000 UNIT) CAPS capsule Take 1 capsule (50,000 Units total) by mouth every  7 (seven) days. 03/05/24  Yes [name] PARAS, NP  Accu-Chek Softclix Lancets lancets Use as instructed to monitor glucose twice daily. 08/31/24   [name] PARAS, NP  blood glucose meter kit and supplies KIT Dispense based on patient and insurance preference. Use twice daily (before breakfast and before bed). 07/30/21   [name] PARAS, NP  glucose blood (ACCU-CHEK GUIDE TEST) test strip Use as instructed to monitor glucose twice daily. 12/15/23   [name] PARAS, NP  Insulin  Pen Needle (PEN NEEDLES) 31G X 6 MM MISC Use to inject insulin  once daily 06/10/23   [name] PARAS, NP    Scheduled Meds:  apixaban   5 mg Oral BID   fluticasone   2 spray Each Nare Daily   insulin  aspart  0-9 Units Subcutaneous TID WC   insulin  aspart  0-9 Units Subcutaneous TID WC   insulin  glargine  40 Units Subcutaneous QHS   metoprolol  tartrate  50 mg Oral TID   multivitamin with minerals  1 tablet Oral Daily   pantoprazole   40 mg Oral Daily   sodium chloride  flush  3 mL Intravenous Q12H   thyroid   60 mg Oral  QAC breakfast   Continuous Infusions:  sodium chloride      diltiazem  (CARDIZEM ) infusion Stopped (10/23/24 0127)   lactated ringers      PRN Meds: sodium chloride , acetaminophen  **OR** acetaminophen , albuterol , alum & mag hydroxide-simeth, ondansetron  **OR** ondansetron  (ZOFRAN ) IV, sodium chloride  flush  Allergies:   Allergies[1]  Social History:   Social History   Socioeconomic History   Marital status: Widowed    Spouse name: Not on file   Number of children: Not on file   Years of education: Not on file   Highest education level: GED or equivalent  Occupational History   Not on file  Tobacco Use   Smoking status: Former    Current packs/day: 0.00    Average packs/day: 1 pack/day for 40.0 years (40.0 ttl pk-yrs)    Types: Cigarettes    Start date: 07/19/1972    Quit date: 07/19/2012    Years since quitting: 12.2    Passive exposure: Past   Smokeless tobacco: Never   Tobacco comments:    quit smoking in 07/17/2012 Vaping Use   Vaping status: Never Used  Substance and Sexual Activity   Alcohol use: Not Currently    Comment: Very very rarely. Glass of wine or 1 mixed drink   Drug use: Never   Sexual activity: Not Currently    Birth control/protection: Surgical  Other Topics Concern   Not on file  Social History Narrative   Widow,husband died 2020/10/18after 35 years marriage.Retired.   Social Drivers of Health   Tobacco Use: Medium Risk (10/22/2024)   Patient History    Smoking Tobacco Use: Former    Smokeless Tobacco Use: Never    Passive Exposure: Past  Physicist, Medical Strain: Low Risk (09/04/2024)   Overall Financial Resource Strain (CARDIA)    Difficulty of Paying Living Expenses: Not hard at all  Food Insecurity: No Food Insecurity (10/22/2024)   Epic    Worried About Programme Researcher, Broadcasting/film/video in the Last Year: Never true    Ran Out of Food in the Last Year: Never true  Transportation Needs: No Transportation Needs (10/22/2024)   Epic    Lack of  Transportation (Medical): No    Lack of Transportation (Non-Medical): No  Physical Activity: Inactive (10/05/2024)   Exercise Vital Sign    Days  of Exercise per Week: 0 days    Minutes of Exercise per Session: 0 min  Stress: No Stress Concern Present (10/05/2024)   [employer] of Occupational Health - Occupational Stress Questionnaire    Feeling of Stress: Not at all  Recent Concern: Stress - Stress Concern Present (09/04/2024)   [employer] of Occupational Health - Occupational Stress Questionnaire    Feeling of Stress: To some extent  Social Connections: Socially Isolated (10/22/2024)   Social Connection and Isolation Panel    Frequency of Communication with Friends and Family: Once a week    Frequency of Social Gatherings with Friends and Family: Once a week    Attends Religious Services: More than 4 times per year    Active Member of [employer] or Organizations: No    Attends Banker Meetings: Never    Marital Status: Widowed  Intimate Partner Violence: Not At Risk (10/22/2024)   Epic    Fear of Current or Ex-Partner: No    Emotionally Abused: No    Physically Abused: No    Sexually Abused: No  Depression (PHQ2-9): Low Risk (10/05/2024)   Depression (PHQ2-9)    PHQ-2 Score: 0  Alcohol Screen: Low Risk (09/04/2024)   Alcohol Screen    Last Alcohol Screening Score (AUDIT): 1  Housing: Low Risk (10/22/2024)   Epic    Unable to Pay for Housing in the Last Year: No    Number of Times Moved in the Last Year: 0    Homeless in the Last Year: No  Utilities: Not At Risk (10/22/2024)   Epic    Threatened with loss of utilities: No  Health Literacy: Adequate Health Literacy (10/05/2024)   B1300 Health Literacy    Frequency of need for help with medical instructions: Never    Family History:    Family History  Problem Relation Age of Onset   Cancer Mother        pancreatic     ROS:  Please see the history of present illness.   All other ROS reviewed and negative.      Physical Exam/Data: Vitals:   10/23/24 0017 10/23/24 0128 10/23/24 0143 10/23/24 0203  BP: (!) 96/57 (!) 86/51 (!) 88/58 (!) 93/51  Pulse: 78 67 67 66  Resp: 18     Temp: 97.6 F (36.4 C)     TempSrc: Oral     SpO2: 96% 96% 98% 93%  Weight:      Height:        Intake/Output Summary (Last 24 hours) at 10/23/2024 0305 Last data filed at 10/22/2024 2224 Gross per 24 hour  Intake --  Output 300 ml  Net -300 ml      10/22/2024   10:46 PM 10/22/2024   11:57 AM 10/05/2024   11:51 AM  Last 3 Weights  Weight (lbs) 160 lb 6.4 oz 163 lb 152 lb  Weight (kg) 72.757 kg 73.936 kg 68.947 kg     Body mass index is 25.89 kg/m.  General:  Well nourished, well developed, in no acute distress HEENT: normal Neck: no JVD Vascular: No carotid bruits; Distal pulses 2+ bilaterally Cardiac:  normal S1, S2; RRR; no murmur  Lungs:  clear to auscultation bilaterally, no wheezing, rhonchi or rales  Abd: soft, nontender, no hepatomegaly  Ext: no edema Musculoskeletal:  No deformities, BUE and BLE strength normal and equal Skin: warm and dry  Neuro:  CNs 2-12 intact, no focal abnormalities noted Psych:  Normal affect   EKG:  The EKG was personally reviewed and demonstrates:  Typical atrial flutter Telemetry:  Telemetry was personally reviewed and demonstrates:  Persistent atrial flutter with variable block  Relevant CV Studies: Echo 11/22/15  - Left ventricle: The cavity size was normal. Wall thickness was    increased in a pattern of mild LVH. Systolic function was    vigorous. The estimated ejection fraction was in the range of 65%    to 70%. Wall motion was normal; there were no regional wall    motion abnormalities. Doppler parameters are consistent with    abnormal left ventricular relaxation (grade 1 diastolic    dysfunction).  - Right atrium: Central venous pressure (est): 3 mm Hg.  - Atrial septum: No defect or patent foramen ovale was identified.  - Tricuspid valve: There was trivial  regurgitation.  - Pulmonary arteries: PA peak pressure: 21 mm Hg (S).  - Pericardium, extracardiac: There was no pericardial effusion.    Laboratory Data: High Sensitivity Troponin:  No results for input(s): TROPONINIHS in the last 720 hours.  Recent Labs  Lab 10/22/24 1301 10/22/24 1537  TRNPT 8 8      Chemistry Recent Labs  Lab 10/22/24 1301  NA 139  K 3.9  CL 102  CO2 23  GLUCOSE 255*  BUN 20  CREATININE 1.00  CALCIUM 9.3  MG 2.0  GFRNONAA 58*  ANIONGAP 14    Recent Labs  Lab 10/22/24 1301  PROT 6.7  ALBUMIN 4.6  AST 23  ALT 17  ALKPHOS 82  BILITOT 0.6   Lipids No results for input(s): CHOL, TRIG, HDL, LABVLDL, LDLCALC, CHOLHDL in the last 168 hours.  Hematology Recent Labs  Lab 10/22/24 1301  WBC 6.7  RBC 5.40*  HGB 13.7  HCT 43.5  MCV 80.6  MCH 25.4*  MCHC 31.5  RDW 14.6  PLT 243   Thyroid   Recent Labs  Lab 10/22/24 1301  TSH 1.940    BNP Recent Labs  Lab 10/22/24 1301  PROBNP 1,319.0*    DDimer No results for input(s): DDIMER in the last 168 hours.  Radiology/Studies:  DG Chest Portable 1 View Result Date: 10/22/2024 EXAM: 1 VIEW(S) XRAY OF THE CHEST 10/22/2024 01:11:29 PM COMPARISON: 11/13/2023 CLINICAL HISTORY: Shortness of breath. FINDINGS: LUNGS AND PLEURA: Linear subsegmental atelectasis or scarring along both lung bases. No pleural effusion. No pneumothorax. HEART AND MEDIASTINUM: Atheromatous vascular calcification of the aortic arch. No acute abnormality of the cardiac silhouette. The mediastinal silhouette is otherwise unremarkable. BONES AND SOFT TISSUES: ACDF hardware in lower cervical spine. No acute osseous abnormality. Cholecystectomy clips are noted in the upper abdomen, outside the visualized chest. IMPRESSION: 1. Linear subsegmental atelectasis or scarring at both lung bases. 2. Atherosclerotic calcification of the aortic arch. 3. Lower cervical anterior cervical discectomy and fusion hardware. Electronically  signed by: Ryan Salvage MD 10/22/2024 01:33 PM EST RP Workstation: [id number]     Assessment and Plan: Typical atrial flutter- Newly diagnosed after routine ECG at pulmonology appointment revealed heart rate of 140.  Initially treated with IV heparin  and diltiazem  drip with resulting soft blood pressures and persistent atrial flutter although rate controlled in the 60s.  CHA2DS2-VASc is 5 for age, hypertension, likely new CHF.  She has been transition to apixaban  currently. - Continue apixaban  - Continue metoprolol  if blood pressure allows - Echocardiogram - Ideally would receive TEE guided cardioversion - Given symptoms for 3 weeks and no anticoagulation and elevated CHA2DS2-VASc, history of ILD, would like to avoid amiodarone. Volume overload-reported  mild dyspnea above her baseline with elevated BNP - When blood pressure is better we will begin gentle diuresis - Avoid additional fluid boluses given her oxygen saturation has now decreased to 90% Hypertension-on home amlodipine    Risk Assessment/Risk Scores:       New York  Heart Association (NYHA) Functional Class NYHA Class II  CHA2DS2-VASc Score = 5   This indicates a 7.2% annual risk of stroke. The patient's score is based upon: CHF History: 0 HTN History: 1 Diabetes History: 1 Stroke History: 0 Vascular Disease History: 0 Age Score: 2 Gender Score: 1        For questions or updates, please contact [facility] Please consult [email or web address] for contact info under      Signed, Jalaine DELENA Newcomer, MD  10/23/2024 3:05 AM     [1]  Allergies Allergen Reactions   Advair Diskus [Fluticasone -Salmeterol] Anaphylaxis   Codeine Nausea And Vomiting   Hydrocodone  Nausea And Vomiting   Levothyroxine  Other (See Comments)    Caused lower leg swelling   Oxycodone  Nausea And Vomiting   Statins     Muscle and joint pain   Tetracyclines & Related Itching    Internal   "

## 2024-10-23 NOTE — Progress Notes (Signed)
 [facility] night cross cover note:   I was notified by RN of the patient's request for a sleep aid. I subsequently placed order for prn melatonin for insomnia.     Newton Pigg, DO Hospitalist

## 2024-10-23 NOTE — Progress Notes (Signed)
"  °  Progress Note  Patient Name: [name] Date of Encounter: 10/23/2024 [facility] Cardiologist: Alvan Carrier, MD Branch  Interval Summary   Hypotensive and symptomatic due to rate controlling agents.   Vital Signs Vitals:   10/23/24 0143 10/23/24 0203 10/23/24 0442 10/23/24 0456  BP: (!) 88/58 (!) 93/51 114/70 118/62  Pulse: 67 66 (!) 134 (!) 135  Resp:    18  Temp:      TempSrc:      SpO2: 98% 93% 93% 97%  Weight:      Height:        Intake/Output Summary (Last 24 hours) at 10/23/2024 0626 Last data filed at 10/23/2024 0437 Gross per 24 hour  Intake 485.05 ml  Output 300 ml  Net 185.05 ml      10/22/2024   10:46 PM 10/22/2024   11:57 AM 10/05/2024   11:51 AM  Last 3 Weights  Weight (lbs) 160 lb 6.4 oz 163 lb 152 lb  Weight (kg) 72.757 kg 73.936 kg 68.947 kg      Telemetry/ECG  Aflutter rates 130s-140s - Personally Reviewed  Physical Exam  GEN: No acute distress.   Neck: No JVD Cardiac: tachycardic and regular no murmurs, rubs, or gallops.  Respiratory: Clear to auscultation bilaterally. GI: Soft, nontender, non-distended  MS: No edema  Assessment & Plan   Aflutter 2:1 conduction RVR DOE - on IV dilt, hypotensive. - did not do well with metoprolol  50 mg tid, will dc metoprolol  and use digoxin  instead. Will dig load today and see if this helps rates given poor response to AVN blocking agents due to hypotension. - start eliquis  5 mg bid. Per pharmacy - echo pending -likely TEE/DCCV Monday or Tuesday unless rate controlled or converts.  - no chest pain, no pleuritic pain. Prior normal stress test 2022. Trops negative. - check tsh - bnp up, no evidence of HF. Got lasix  and now hypotensive with AKI. Has been hydrated.   HTN - hypertensive on admission but now hypotensive, as above.   IPF - will eval for PHTN or RH dysfunction on echo. Lungs clear today.    Hypothyroidism -check tsh - meds per primary. Armour thyroid .   DM2 - insulin   per primary.      For questions or updates, please contact [facility] Please consult [email or web address] for contact info under         Signed, [name], MD   "

## 2024-10-23 NOTE — Progress Notes (Signed)
" °  Progress Note   Patient: [name] FMW:[id number] DOB: [birth year] DOA: 10/22/2024     1 DOS: the patient was seen and examined on 10/23/2024   Brief hospital course: 74 y/o female with hx of  ILD (incidental finding-follows Dr. Jude), HTN, GERD, DM-2, hypothyroidism, parathyroid  adenoma-who was referred to the ED by her pulmonologist after she was found to have significant lower extremity edema, shortness of breath and newly diagnosed A-fib/flutter with RVR.  Patient was virtual admission. She was admitted for New onset A-flutter/A-fib with RVR and New onset Heart failure. Case was discussed with cardiology and  Cardiology recommended diltiazem  drip and oral metoprolol . Echocardiogram pending. She was found to have BNP 1329.0. she was given one dose of 20 mg IV lasix . Patient continue to be in A.fib with RVR despite titration of diltiazem  drip. During the night she became hypotensive and was given bolus of IV normal saline. Her blood pressure has improved . She was seen by cardiology and felt that patient did not tolerated metoprolol  which has been discontinued. Patient is being load with digoxin  . Plan for cardioversion next week  Assessment and Plan: New onset A-flutter/A-fib with RVR  Initially she was started on oral metoprolol  and  diltiazem  drip. But it seem that she did not tolerate metoprolol  which has been discontinued. Being dose with Digoxin  Plan TEE/ DCCV Next week Echocardiogram pending   New onset HFpEF versus HFrEF Echocardiogram pending Received dose of lasix . Patient has reached euvolemic state    HTN Continue to hold hypertensive agents due hypotensive     DM-2 (A1c 8.0 on 08/02/2024) CBG not well control  Increase Lantus  to 42 units daily Continue insulin  sliding scale  Recheck hemoglobin A1C    Idiopathic pulmonary fibrosis Being followed closely in the outpatient setting by Dr. Alva-currently not on any medications   GERD Continue PPI     Hypothyroidism Continue usual outpatient regimen Acute kidney Injury  Felt to be secondary to renal hypoperfusion in the setting of hypotension Her creatine increased from 1.0 to 1.29 Avoid nephrotoxins.  She did received IV fluid bolus 500 LR . Will recheck BMP in am      Subjective Seen today> She feel fine. No sob, no chest pain, no headache, no nausea, no vomiting   Physical Exam: Vitals:   10/23/24 0952 10/23/24 1112 10/23/24 1125 10/23/24 1455  BP: (!) 85/72 103/72 112/71 104/63  Pulse:  (!) 118  (!) 137  Resp:  20 19   Temp:   97.7 F (36.5 C)   TempSrc:   Oral   SpO2:    92%  Weight:      Height:       HEENT: head atraumatic  Neck supple Resp: decrease breath sound No wheezes. +rales CVS ; s1/s2 + No JVD  ABD: soft, +BS  No tender  Ext: trace edema  Neuro: A&Ox3 No focal neurology deficit  Psych: Moods and affect appropriate Data Reviewed:   Family Communication: No family at bedside   Disposition: Status is: Inpatient Remains inpatient appropriate because: Atrial fib with RVR complicated by acute kidney and hypotension   Planned Discharge Destination: Home  Time spent: 50 minutes  Author: Jefferson Marinas, MD 10/23/2024 4:54 PM  For on call review [email or web address].  "

## 2024-10-23 NOTE — Progress Notes (Signed)
 Mobility Specialist Progress Note:    10/23/24 1200  Mobility  Activity Ambulated with assistance;Pivoted/transferred from bed to chair  Level of Assistance Contact guard assist, steadying assist  Assistive Device Other (Comment) (HHA)  Distance Ambulated (ft) 4 ft  Activity Response Tolerated well  Mobility Referral Yes  Mobility visit 1 Mobility  Mobility Specialist Start Time (ACUTE ONLY) 1215  Mobility Specialist Stop Time (ACUTE ONLY) 1229  Mobility Specialist Time Calculation (min) (ACUTE ONLY) 14 min   Pt received seated EOB requesting to get to the chair. No physical assistance needed. HR was in the 130s with a peak at 140 during transfer. No c/o throughout. Once seated in chair HR went back down to 106. Left in chair w/ call bell and personal belongings in reach. All needs met. RN aware.  Thersia Minder Mobility Specialist  Please contact vis Secure Chat or  Rehab Office [phone]

## 2024-10-24 ENCOUNTER — Other Ambulatory Visit (HOSPITAL_COMMUNITY): Payer: Self-pay

## 2024-10-24 DIAGNOSIS — I4891 Unspecified atrial fibrillation: Secondary | ICD-10-CM | POA: Diagnosis not present

## 2024-10-24 LAB — CBC
HCT: 38.9 % (ref 36.0–46.0)
Hemoglobin: 12.3 g/dL (ref 12.0–15.0)
MCH: 25.3 pg — ABNORMAL LOW (ref 26.0–34.0)
MCHC: 31.6 g/dL (ref 30.0–36.0)
MCV: 80 fL (ref 80.0–100.0)
Platelets: 217 10*3/uL (ref 150–400)
RBC: 4.86 MIL/uL (ref 3.87–5.11)
RDW: 14.6 % (ref 11.5–15.5)
WBC: 5.7 10*3/uL (ref 4.0–10.5)
nRBC: 0 % (ref 0.0–0.2)

## 2024-10-24 LAB — BASIC METABOLIC PANEL WITH GFR
Anion gap: 11 (ref 5–15)
BUN: 19 mg/dL (ref 8–23)
CO2: 25 mmol/L (ref 22–32)
Calcium: 9.2 mg/dL (ref 8.9–10.3)
Chloride: 103 mmol/L (ref 98–111)
Creatinine, Ser: 1.16 mg/dL — ABNORMAL HIGH (ref 0.44–1.00)
GFR, Estimated: 49 mL/min — ABNORMAL LOW
Glucose, Bld: 150 mg/dL — ABNORMAL HIGH (ref 70–99)
Potassium: 4.6 mmol/L (ref 3.5–5.1)
Sodium: 138 mmol/L (ref 135–145)

## 2024-10-24 LAB — MAGNESIUM: Magnesium: 1.7 mg/dL (ref 1.7–2.4)

## 2024-10-24 LAB — GLUCOSE, CAPILLARY
Glucose-Capillary: 129 mg/dL — ABNORMAL HIGH (ref 70–99)
Glucose-Capillary: 209 mg/dL — ABNORMAL HIGH (ref 70–99)
Glucose-Capillary: 134 mg/dL — ABNORMAL HIGH (ref 70–99)
Glucose-Capillary: 153 mg/dL — ABNORMAL HIGH (ref 70–99)

## 2024-10-24 MED ORDER — DIGOXIN 0.25 MG/ML IJ SOLN
0.2500 mg | Freq: Every day | INTRAMUSCULAR | Status: DC
  Administered 2024-10-25: 0.25 mg via INTRAVENOUS
  Filled 2024-10-24: qty 1

## 2024-10-24 NOTE — H&P (View-Only) (Signed)
"  °  Progress Note  Patient Name: [name] Date of Encounter: 10/24/2024 [facility] Cardiologist: Brandi Carrier, MD Branch  Interval Summary   HR better with digoxin  plus IV diltiazem . Rates 90s still in flutter. BP improved with stopping metoprolol . She feels better than admission.  Vital Signs Vitals:   10/23/24 1957 10/23/24 2328 10/24/24 0439 10/24/24 0746  BP: 133/62 (!) 131/51 (!) 122/53 (!) 122/54  Pulse: (!) 51 61 91 69  Resp: 18 16 16 18   Temp: 97.9 F (36.6 C) 97.8 F (36.6 C) 97.7 F (36.5 C) (!) 97.5 F (36.4 C)  TempSrc: Oral Oral Oral Oral  SpO2: 95% 94% 98% 99%  Weight:      Height:        Intake/Output Summary (Last 24 hours) at 10/24/2024 0956 Last data filed at 10/24/2024 0441 Gross per 24 hour  Intake 477 ml  Output 1200 ml  Net -723 ml      10/22/2024   10:46 PM 10/22/2024   11:57 AM 10/05/2024   11:51 AM  Last 3 Weights  Weight (lbs) 160 lb 6.4 oz 163 lb 152 lb  Weight (kg) 72.757 kg 73.936 kg 68.947 kg      Telemetry/ECG  Aflutter rates 90s - Personally Reviewed  Physical Exam  GEN: No acute distress.   Neck: No JVD Cardiac: irregular rhythm normal rate, no murmurs, rubs, or gallops.  Respiratory: Clear to auscultation bilaterally. GI: Soft, nontender, non-distended  MS: No edema  Assessment & Plan   Aflutter 2:1 conduction RVR, rates improved. DOE - on IV dilt - did not do well with metoprolol  50 mg tid due to hypotension and vagal symptoms, will dc metoprolol  and use digoxin  instead. Will dig load today and see if this helps rates given poor response to AVN blocking agents due to hypotension. - continue IV digoxin  0.25 mg daily until cardioversion. - start eliquis  5 mg bid. Per pharmacy - echo pending -likely TEE/DCCV Monday or Tuesday unless rate controlled or converts.  - no chest pain, no pleuritic pain. Prior normal stress test 2022. Trops negative. - tsh normal - bnp up, no evidence of HF. Got lasix  and now  hypotensive with AKI. Has been hydrated. Hold further lasix .   HTN - hypertensive on admission then hypotensive with rate control, now bp normotensive.   IPF - no PHTN on echo.   Hypothyroidism -tsh normal - meds per primary. Armour thyroid .   DM2 - insulin  per primary. A1c 7.6%. Needs slightly better control.   Nocturnal hypoxia - noted by nursing overnight. - with new aflutter, recommend outpatient sleep study.   Informed Consent   Shared Decision Making/Informed Consent   The risks [stroke, cardiac arrhythmias rarely resulting in the need for a temporary or permanent pacemaker, skin irritation or burns, esophageal damage, perforation (1:10,000 risk), bleeding, pharyngeal hematoma as well as other potential complications associated with conscious sedation including aspiration, arrhythmia, respiratory failure and death], benefits (treatment guidance, restoration of normal sinus rhythm, diagnostic support) and alternatives of a transesophageal echocardiogram guided cardioversion were discussed in detail with [name] and she is willing to proceed.       For questions or updates, please contact  [facility] Please consult [email or web address] for contact info under         Signed, Brandi DELENA Merck, MD   "

## 2024-10-24 NOTE — TOC CM/SW Note (Addendum)
 Transition of Care [facility]) - Inpatient Brief Assessment   Patient Details  Name: RAVON MCILHENNY MRN: [id number] Date of Birth: [birth year]  [facility]) CM/SW Contact:    Tom-Johnson, Harvest Muskrat, RN Phone Number: 10/24/2024, 11:32 AM   Clinical Narrative:  Patient presented to the ED from her Pulmonologist with significant Lower Extremity Edema, Shortness of Breath and newly diagnosed A-Fib/Flutter with RVR. Patient transitioned from Heparin  gtt to Eliquis . Patient is being load with Digoxin , plan for Cardioversion this week.  No ICM needs or recommendations noted at this time.  Patient not Medically ready for discharge.  CM will continue to follow as patient progresses with care towards discharge.      Transition of Care Asessment:

## 2024-10-24 NOTE — Progress Notes (Addendum)
"  °  Progress Note  Patient Name: [name] Date of Encounter: 10/24/2024 [facility] Cardiologist: Alvan Carrier, MD Branch  Interval Summary   HR better with digoxin  plus IV diltiazem . Rates 90s still in flutter. BP improved with stopping metoprolol . She feels better than admission.  Vital Signs Vitals:   10/23/24 1957 10/23/24 2328 10/24/24 0439 10/24/24 0746  BP: 133/62 (!) 131/51 (!) 122/53 (!) 122/54  Pulse: (!) 51 61 91 69  Resp: 18 16 16 18   Temp: 97.9 F (36.6 C) 97.8 F (36.6 C) 97.7 F (36.5 C) (!) 97.5 F (36.4 C)  TempSrc: Oral Oral Oral Oral  SpO2: 95% 94% 98% 99%  Weight:      Height:        Intake/Output Summary (Last 24 hours) at 10/24/2024 0956 Last data filed at 10/24/2024 0441 Gross per 24 hour  Intake 477 ml  Output 1200 ml  Net -723 ml      10/22/2024   10:46 PM 10/22/2024   11:57 AM 10/05/2024   11:51 AM  Last 3 Weights  Weight (lbs) 160 lb 6.4 oz 163 lb 152 lb  Weight (kg) 72.757 kg 73.936 kg 68.947 kg      Telemetry/ECG  Aflutter rates 90s - Personally Reviewed  Physical Exam  GEN: No acute distress.   Neck: No JVD Cardiac: irregular rhythm normal rate, no murmurs, rubs, or gallops.  Respiratory: Clear to auscultation bilaterally. GI: Soft, nontender, non-distended  MS: No edema  Assessment & Plan   Aflutter 2:1 conduction RVR, rates improved. DOE - on IV dilt - did not do well with metoprolol  50 mg tid due to hypotension and vagal symptoms, will dc metoprolol  and use digoxin  instead. Will dig load today and see if this helps rates given poor response to AVN blocking agents due to hypotension. - continue IV digoxin  0.25 mg daily until cardioversion. - start eliquis  5 mg bid. Per pharmacy - echo pending -likely TEE/DCCV Monday or Tuesday unless rate controlled or converts.  - no chest pain, no pleuritic pain. Prior normal stress test 2022. Trops negative. - tsh normal - bnp up, no evidence of HF. Got lasix  and now  hypotensive with AKI. Has been hydrated. Hold further lasix .   HTN - hypertensive on admission then hypotensive with rate control, now bp normotensive.   IPF - no PHTN on echo.   Hypothyroidism -tsh normal - meds per primary. Armour thyroid .   DM2 - insulin  per primary. A1c 7.6%. Needs slightly better control.   Nocturnal hypoxia - noted by nursing overnight. - with new aflutter, recommend outpatient sleep study.   Informed Consent   Shared Decision Making/Informed Consent   The risks [stroke, cardiac arrhythmias rarely resulting in the need for a temporary or permanent pacemaker, skin irritation or burns, esophageal damage, perforation (1:10,000 risk), bleeding, pharyngeal hematoma as well as other potential complications associated with conscious sedation including aspiration, arrhythmia, respiratory failure and death], benefits (treatment guidance, restoration of normal sinus rhythm, diagnostic support) and alternatives of a transesophageal echocardiogram guided cardioversion were discussed in detail with [name] and she is willing to proceed.       For questions or updates, please contact  [facility] Please consult [email or web address] for contact info under         Signed, [name], MD   "

## 2024-10-24 NOTE — Care Management (Signed)
 [facility] resources added to AVS

## 2024-10-24 NOTE — Progress Notes (Signed)
" °  Progress Note   Patient: [name] FMW:[id number] DOB: December 31, 1950 DOA: 10/22/2024     2 DOS: the patient was seen and examined on 10/24/2024   Brief hospital course: 74 y/o female with hx of  ILD (incidental finding-follows Dr. Jude), HTN, GERD, DM-2, hypothyroidism, parathyroid  adenoma-who was referred to the ED by her pulmonologist after she was found to have significant lower extremity edema, shortness of breath and newly diagnosed A-fib/flutter with RVR.  Patient was virtual admission. She was admitted for New onset A-flutter/A-fib with RVR and New onset Heart failure. Case was discussed with cardiology and  Cardiology recommended diltiazem  drip and oral metoprolol . Echocardiogram pending. She was found to have BNP 1329.0. she was given one dose of 20 mg IV lasix . Patient continue to be in A.fib with RVR despite titration of diltiazem  drip. During the night she became hypotensive and was given bolus of IV normal saline. Her blood pressure has improved . She was seen by cardiology and felt that patient did not tolerated metoprolol  which has been discontinued. Patient is being load with digoxin  . Plan for cardioversion next week  Assessment and Plan: New onset A-flutter/A-fib with RVR  Initially she was started on oral metoprolol  and  diltiazem  drip. But it seem that she did not tolerate metoprolol  which has been discontinued. Being dose with Digoxin  Plan TEE/ DCCV Next week Echo pending   New onset HFpEF versus HFrEF Echocardiogram pending Received dose of lasix . Reached euvolemic state  HTN Initially hypertensive then hypotensive BP has normalized Continue to monitor  DM-2 (A1c 8.0 on 08/02/2024) CBG improving  Continue  Lantus   42 units daily Continue insulin  sliding scale  Repeat  hemoglobin A1C 7.6    Idiopathic pulmonary fibrosis Being followed closely in the outpatient setting by Dr. Alva-currently not on any medications   GERD Continue PPI    Hypothyroidism Continue  usual outpatient regimen Acute kidney Injury  Felt to be secondary to renal hypoperfusion in the setting of hypotension Recent creatine 1.16 and improving  Avoid nephrotoxins. Insomnia  Continue Melatonin prn      Subjective Seen today> Resting. No overnight event . No sob, no chest pain, no headache, no nausea, no vomiting   Physical Exam: Vitals:   10/23/24 2328 10/24/24 0439 10/24/24 0746 10/24/24 1128  BP: (!) 131/51 (!) 122/53 (!) 122/54 134/71  Pulse: 61 91 69 90  Resp: 16 16 18 16   Temp: 97.8 F (36.6 C) 97.7 F (36.5 C) (!) 97.5 F (36.4 C) 97.6 F (36.4 C)  TempSrc: Oral Oral Oral Oral  SpO2: 94% 98% 99%   Weight:      Height:       HEENT: head atraumatic  Neck supple Resp: decrease breath sound No wheezes. +rales CVS ; s1/s2 + No JVD  ABD: soft, +BS  No tender  Ext: trace edema  Neuro: A&Ox3 No focal neurology deficit  Psych: Moods and affect appropriate Data Reviewed:   Family Communication: No family at bedside   Disposition: Status is: Inpatient Remains inpatient appropriate because: Atrial fib with RVR complicated by acute kidney and hypotension. Schedule for cardioversion next week    Planned Discharge Destination: Home  Time spent: 50 minutes  Author: Jefferson Marinas, MD 10/24/2024 3:26 PM  For on call review [email or web address].  "

## 2024-10-25 ENCOUNTER — Inpatient Hospital Stay (HOSPITAL_COMMUNITY): Admitting: Anesthesiology

## 2024-10-25 ENCOUNTER — Inpatient Hospital Stay (HOSPITAL_COMMUNITY)

## 2024-10-25 ENCOUNTER — Encounter (HOSPITAL_COMMUNITY): Admission: EM | Disposition: A | Payer: Self-pay | Source: Ambulatory Visit | Attending: Internal Medicine

## 2024-10-25 ENCOUNTER — Other Ambulatory Visit: Payer: Self-pay

## 2024-10-25 ENCOUNTER — Other Ambulatory Visit (HOSPITAL_COMMUNITY): Payer: Self-pay

## 2024-10-25 ENCOUNTER — Telehealth (HOSPITAL_COMMUNITY): Payer: Self-pay

## 2024-10-25 DIAGNOSIS — J449 Chronic obstructive pulmonary disease, unspecified: Secondary | ICD-10-CM | POA: Diagnosis not present

## 2024-10-25 DIAGNOSIS — I340 Nonrheumatic mitral (valve) insufficiency: Secondary | ICD-10-CM

## 2024-10-25 DIAGNOSIS — I4892 Unspecified atrial flutter: Secondary | ICD-10-CM

## 2024-10-25 DIAGNOSIS — I361 Nonrheumatic tricuspid (valve) insufficiency: Secondary | ICD-10-CM

## 2024-10-25 DIAGNOSIS — I10 Essential (primary) hypertension: Secondary | ICD-10-CM

## 2024-10-25 DIAGNOSIS — Z87891 Personal history of nicotine dependence: Secondary | ICD-10-CM | POA: Diagnosis not present

## 2024-10-25 LAB — BASIC METABOLIC PANEL WITH GFR
Anion gap: 11 (ref 5–15)
BUN: 16 mg/dL (ref 8–23)
CO2: 26 mmol/L (ref 22–32)
Calcium: 9.1 mg/dL (ref 8.9–10.3)
Chloride: 103 mmol/L (ref 98–111)
Creatinine, Ser: 1.01 mg/dL — ABNORMAL HIGH (ref 0.44–1.00)
GFR, Estimated: 58 mL/min — ABNORMAL LOW
Glucose, Bld: 101 mg/dL — ABNORMAL HIGH (ref 70–99)
Potassium: 4 mmol/L (ref 3.5–5.1)
Sodium: 141 mmol/L (ref 135–145)

## 2024-10-25 LAB — CBC
HCT: 41.2 % (ref 36.0–46.0)
Hemoglobin: 13.6 g/dL (ref 12.0–15.0)
MCH: 25.8 pg — ABNORMAL LOW (ref 26.0–34.0)
MCHC: 33 g/dL (ref 30.0–36.0)
MCV: 78.2 fL — ABNORMAL LOW (ref 80.0–100.0)
Platelets: 230 10*3/uL (ref 150–400)
RBC: 5.27 MIL/uL — ABNORMAL HIGH (ref 3.87–5.11)
RDW: 14.6 % (ref 11.5–15.5)
WBC: 6.1 10*3/uL (ref 4.0–10.5)
nRBC: 0 % (ref 0.0–0.2)

## 2024-10-25 LAB — ECHO TEE

## 2024-10-25 LAB — GLUCOSE, CAPILLARY
Glucose-Capillary: 118 mg/dL — ABNORMAL HIGH (ref 70–99)
Glucose-Capillary: 114 mg/dL — ABNORMAL HIGH (ref 70–99)
Glucose-Capillary: 96 mg/dL (ref 70–99)
Glucose-Capillary: 180 mg/dL — ABNORMAL HIGH (ref 70–99)

## 2024-10-25 LAB — MAGNESIUM: Magnesium: 1.8 mg/dL (ref 1.7–2.4)

## 2024-10-25 MED ORDER — LACTATED RINGERS IV SOLN: INTRAVENOUS | Status: DC | PRN

## 2024-10-25 MED ORDER — SODIUM CHLORIDE 0.9 % IV SOLN: INTRAVENOUS | Status: DC

## 2024-10-25 MED ORDER — PROPOFOL 10 MG/ML IV BOLUS
INTRAVENOUS | Status: DC | PRN
  Administered 2024-10-25: 70 mg via INTRAVENOUS
  Administered 2024-10-25: 120 ug/kg/min via INTRAVENOUS

## 2024-10-25 MED ORDER — METOPROLOL TARTRATE 25 MG PO TABS
25.0000 mg | ORAL_TABLET | Freq: Two times a day (BID) | ORAL | Status: DC
  Administered 2024-10-25 – 2024-10-26 (×2): 25 mg via ORAL
  Filled 2024-10-25 (×2): qty 1

## 2024-10-25 MED ORDER — DIGOXIN 125 MCG PO TABS: 0.1250 mg | ORAL_TABLET | Freq: Every day | ORAL | Status: DC

## 2024-10-25 NOTE — CV Procedure (Signed)
" ° °  TRANSESOPHAGEAL ECHOCARDIOGRAM GUIDED DIRECT CURRENT CARDIOVERSION  NAME:  [name]    MRN: [id number] DOB:  12-30-1950    ADMIT DATE: 10/22/2024  INDICATIONS: Symptomatic atrial flutter  PROCEDURE:  Informed consent was obtained prior to the procedure. The risks, benefits and alternatives for the procedure were discussed and the patient comprehended these risks.  Risks include, but are not limited to, cough, sore throat, vomiting, nausea, somnolence, esophageal and stomach trauma or perforation, bleeding, low blood pressure, aspiration, pneumonia, infection, trauma to the teeth and death.   After a procedural time-out, the oropharynx was anesthetized and the patient was sedated by the anesthesia service. The transesophageal probe was inserted in the esophagus and stomach without difficulty and multiple views were obtained. Anesthesia was monitored by Dr. Darlyn.   COMPLICATIONS:   Complications: No complications Patient tolerated procedure well.  KEY FINDINGS: - No LAA clot - Full Report to follow.   CARDIOVERSION:    Indications:  Symptomatic Atrial Flutter  Procedure Details: Once the TEE was complete, the patient had the defibrillator pads placed in the anterior and posterior position. Once an appropriate level of sedation was confirmed, the patient was cardioverted x 1 with 200J of biphasic synchronized energy.  The patient converted to NSR.  There were no apparent complications.  The patient had normal neuro status and respiratory status post procedure with vitals stable as recorded elsewhere.  Adequate airway was maintained throughout and vital signs monitored per protocol.  Signed, Joelle HUNT Ren Ny, MD, [facility]  [facility]  3:28 PM  "

## 2024-10-25 NOTE — Telephone Encounter (Signed)
 Pharmacy Patient Advocate Encounter  Patient has Medicare Part A&B coverage, no prescription coverage.

## 2024-10-25 NOTE — Interval H&P Note (Signed)
 History and Physical Interval Note:  10/25/2024 12:14 PM  [name]  has presented today for surgery, with the diagnosis of alutter.  The various methods of treatment have been discussed with the patient and family. After consideration of risks, benefits and other options for treatment, the patient has consented to  Procedures: TRANSESOPHAGEAL ECHOCARDIOGRAM (N/A) CARDIOVERSION (N/A) as a surgical intervention.  The patient's history has been reviewed, patient examined, no change in status, stable for surgery.  I have reviewed the patient's chart and labs.  Questions were answered to the patient's satisfaction.     Ceylon Arenson H Azobou Tonleu

## 2024-10-25 NOTE — Transfer of Care (Signed)
 Immediate Anesthesia Transfer of Care Note  Patient: [name]  Procedure(s) Performed: TRANSESOPHAGEAL ECHOCARDIOGRAM CARDIOVERSION  Patient Location: PACU and Cath Lab  Anesthesia Type:MAC  Level of Consciousness: awake and alert   Airway & Oxygen Therapy: Patient Spontanous Breathing and Patient connected to nasal cannula oxygen  Post-op Assessment: Report given to RN and Post -op Vital signs reviewed and stable  Post vital signs: Reviewed and stable  Last Vitals:  Vitals Value Taken Time  BP 134/57 1537  Temp    Pulse 74   Resp 20   SpO2 93     Last Pain:  Vitals:   10/25/24 1426  TempSrc: Oral  PainSc: 0-No pain      Patients Stated Pain Goal: 0 (10/24/24 1959)  Complications: There were no known notable events for this encounter.

## 2024-10-25 NOTE — Progress Notes (Signed)
 "  Rounding Note   Patient Name: [name] Date of Encounter: 10/25/2024  [facility] Cardiologist: Alvan Carrier, MD   Subjective  Has been SOB for several weeks, no chest pain.   Scheduled Meds:  apixaban   5 mg Oral BID   digoxin   0.25 mg Intravenous Daily   fluticasone   2 spray Each Nare Daily   insulin  aspart  0-9 Units Subcutaneous TID WC   insulin  glargine  42 Units Subcutaneous QHS   multivitamin with minerals  1 tablet Oral Daily   pantoprazole   40 mg Oral Daily   sodium chloride  flush  3 mL Intravenous Q12H   thyroid   60 mg Oral QAC breakfast   Continuous Infusions:  [START ON 10/26/2024] sodium chloride      diltiazem  (CARDIZEM ) infusion 12.5 mg/hr (10/25/24 0634)   PRN Meds: acetaminophen  **OR** acetaminophen , albuterol , alum & mag hydroxide-simeth, melatonin, ondansetron  **OR** ondansetron  (ZOFRAN ) IV, sodium chloride  flush   Vital Signs  Vitals:   10/24/24 1951 10/25/24 0002 10/25/24 0303 10/25/24 0747  BP: (!) 148/58 127/70 (!) 103/53 (!) 136/59  Pulse: 63 63 (!) 140 (!) 103  Resp: 18 18 18 18   Temp: 97.9 F (36.6 C) (!) 97.5 F (36.4 C) 98 F (36.7 C) 97.8 F (36.6 C)  TempSrc: Oral Oral Oral Oral  SpO2: 96% 92% 96% 96%  Weight:      Height:        Intake/Output Summary (Last 24 hours) at 10/25/2024 0918 Last data filed at 10/25/2024 9692 Gross per 24 hour  Intake 513.68 ml  Output 2700 ml  Net -2186.32 ml      10/22/2024   10:46 PM 10/22/2024   11:57 AM 10/05/2024   11:51 AM  Last 3 Weights  Weight (lbs) 160 lb 6.4 oz 163 lb 152 lb  Weight (kg) 72.757 kg 73.936 kg 68.947 kg      Telemetry Atrial flutter with variable ventricular conduction - Personally Reviewed  ECG   Atrial flutter - Personally Reviewed  Physical Exam  GEN: No acute distress.   Neck: No JVD Cardiac: irregular, no murmurs, rubs, or gallops.  Respiratory: Clear to auscultation bilaterally. GI: Soft, nontender, non-distended  MS: No edema; No  deformity. Neuro:  Nonfocal  Psych: Normal affect   Labs High Sensitivity Troponin:  No results for input(s): TROPONINIHS in the last 720 hours.   Chemistry Recent Labs  Lab 10/22/24 1301 10/23/24 0428 10/23/24 1851 10/24/24 1024  NA 139 136  --  138  K 3.9 4.3  --  4.6  CL 102 103  --  103  CO2 23 22  --  25  GLUCOSE 255* 201*  --  150*  BUN 20 26*  --  19  CREATININE 1.00 1.29*  --  1.16*  CALCIUM 9.3 9.3  --  9.2  MG 2.0  --  1.8 1.7  PROT 6.7 6.0*  --   --   ALBUMIN 4.6 4.0  --   --   AST 23 16  --   --   ALT 17 16  --   --   ALKPHOS 82 67  --   --   BILITOT 0.6 0.5  --   --   GFRNONAA 58* 43*  --  49*  ANIONGAP 14 11  --  11    Lipids No results for input(s): CHOL, TRIG, HDL, LABVLDL, LDLCALC, CHOLHDL in the last 168 hours.  Hematology Recent Labs  Lab 10/23/24 [phone] 10/24/24 0324 10/25/24 0234  WBC 8.0 5.7 6.1  RBC 4.91 4.86 5.27*  HGB 12.4 12.3 13.6  HCT 39.5 38.9 41.2  MCV 80.4 80.0 78.2*  MCH 25.3* 25.3* 25.8*  MCHC 31.4 31.6 33.0  RDW 14.6 14.6 14.6  PLT 229 217 230   Thyroid   Recent Labs  Lab 10/22/24 1301  TSH 1.940    BNP Recent Labs  Lab 10/22/24 1301  PROBNP 1,319.0*    DDimer No results for input(s): DDIMER in the last 168 hours.   Radiology  ECHOCARDIOGRAM COMPLETE Result Date: 10/23/2024    ECHOCARDIOGRAM REPORT   Patient Name:   [name] Date of Exam: 10/23/2024 Medical Rec #:  [id number]       Height:       66.0 in Accession #:    [id number]      Weight:       160.4 lb Date of Birth:  1951/05/05      BSA:          1.821 m Patient Age:    74 years        BP:           112/71 mmHg Patient Gender: F               HR:           138 bpm. Exam Location:  Inpatient Procedure: 2D Echo (Both Spectral and Color Flow Doppler were utilized during            procedure). Indications:    I48.3 Typical atrial flutter  History:        Patient has prior history of Echocardiogram examinations, most                 recent 11/22/2015.  Arrythmias:Atrial Fibrillation.  Sonographer:    Nathanel Devonshire Referring Phys: LEOMA DONALDA M GHIMIRE IMPRESSIONS  1. Left ventricular ejection fraction, by estimation, is 60 to 65%. Left ventricular ejection fraction by 2D MOD biplane is 67.2 %. The left ventricle has normal function. The left ventricle has no regional wall motion abnormalities. Left ventricular diastolic parameters were normal.  2. Right ventricular systolic function is normal. The right ventricular size is normal. There is normal pulmonary artery systolic pressure. The estimated right ventricular systolic pressure is 32.4 mmHg.  3. The mitral valve is normal in structure. Mild mitral valve regurgitation. No evidence of mitral stenosis.  4. The aortic valve is normal in structure. Aortic valve regurgitation is not visualized. No aortic stenosis is present.  5. The inferior vena cava is normal in size with <50% respiratory variability, suggesting right atrial pressure of 8 mmHg. Comparison(s): No prior Echocardiogram. FINDINGS  Left Ventricle: Left ventricular ejection fraction, by estimation, is 60 to 65%. Left ventricular ejection fraction by 2D MOD biplane is 67.2 %. The left ventricle has normal function. The left ventricle has no regional wall motion abnormalities. The left ventricular internal cavity size was normal in size. There is no left ventricular hypertrophy. Left ventricular diastolic parameters were normal. Right Ventricle: The right ventricular size is normal. No increase in right ventricular wall thickness. Right ventricular systolic function is normal. There is normal pulmonary artery systolic pressure. The tricuspid regurgitant velocity is 2.71 m/s, and  with an assumed right atrial pressure of 3 mmHg, the estimated right ventricular systolic pressure is 32.4 mmHg. Left Atrium: Left atrial size was normal in size. Right Atrium: Right atrial size was normal in size. Pericardium: There is no evidence of pericardial effusion. Mitral  Valve: The mitral valve is normal in structure. Mild mitral valve regurgitation. No evidence of mitral valve stenosis. Tricuspid Valve: The tricuspid valve is normal in structure. Tricuspid valve regurgitation is mild . No evidence of tricuspid stenosis. Aortic Valve: The aortic valve is normal in structure. Aortic valve regurgitation is not visualized. No aortic stenosis is present. Aortic valve mean gradient measures 3.0 mmHg. Aortic valve peak gradient measures 6.4 mmHg. Aortic valve area, by VTI measures 1.94 cm. Pulmonic Valve: The pulmonic valve was normal in structure. Pulmonic valve regurgitation is not visualized. No evidence of pulmonic stenosis. Aorta: The aortic root is normal in size and structure. Venous: The inferior vena cava is normal in size with less than 50% respiratory variability, suggesting right atrial pressure of 8 mmHg. IAS/Shunts: No atrial level shunt detected by color flow Doppler.  LEFT VENTRICLE PLAX 2D                        Biplane EF (MOD) LVIDd:         3.80 cm         LV Biplane EF:   Left LVIDs:         2.70 cm                          ventricular LV PW:         0.90 cm                          ejection LV IVS:        0.90 cm                          fraction by LVOT diam:     1.70 cm                          2D MOD LV SV:         43                               biplane is LV SV Index:   24                               67.2 %. LVOT Area:     2.27 cm LV IVRT:       60 msec         Diastology                                LV e' medial:   10.20 cm/s                                LV E/e' medial: 12.4 LV Volumes (MOD) LV vol d, MOD    30.0 ml A2C: LV vol d, MOD    38.2 ml A4C: LV vol s, MOD    9.6 ml A2C: LV vol s, MOD    12.6 ml A4C: LV SV MOD A2C:   20.4 ml LV SV MOD A4C:   38.2 ml LV SV MOD BP:    24.0 ml RIGHT VENTRICLE  IVC RV Basal diam:  2.80 cm  IVC diam: 1.50 cm TAPSE (M-mode): 1.5 cm LEFT ATRIUM           Index        RIGHT ATRIUM           Index LA diam:       3.70 cm 2.03 cm/m   RA Area:     17.50 cm LA Vol (A2C): 26.1 ml 14.33 ml/m  RA Volume:   49.10 ml  26.97 ml/m LA Vol (A4C): 36.6 ml 20.10 ml/m  AORTIC VALVE                    PULMONIC VALVE AV Area (Vmax):    1.87 cm     PV Vmax:       0.85 m/s AV Area (Vmean):   1.82 cm     PV Peak grad:  2.9 mmHg AV Area (VTI):     1.94 cm AV Vmax:           126.00 cm/s AV Vmean:          87.400 cm/s AV VTI:            0.223 m AV Peak Grad:      6.4 mmHg AV Mean Grad:      3.0 mmHg LVOT Vmax:         104.00 cm/s LVOT Vmean:        69.900 cm/s LVOT VTI:          0.191 m LVOT/AV VTI ratio: 0.86  AORTA Ao Root diam: 2.80 cm Ao Asc diam:  2.80 cm MV E velocity: 126.00 cm/s  TRICUSPID VALVE                             TR Peak grad:   29.4 mmHg                             TR Vmax:        271.00 cm/s                              SHUNTS                             Systemic VTI:  0.19 m                             Systemic Diam: 1.70 cm Joelle Cedars Tonleu Electronically signed by Joelle Cedars Tonleu Signature Date/Time: 10/23/2024/4:44:29 PM    Final     Cardiac Studies  Echo 10/23/2024  1. Left ventricular ejection fraction, by estimation, is 60 to 65%. Left  ventricular ejection fraction by 2D MOD biplane is 67.2 %. The left  ventricle has normal function. The left ventricle has no regional wall  motion abnormalities. Left ventricular  diastolic parameters were normal.   2. Right ventricular systolic function is normal. The right ventricular  size is normal. There is normal pulmonary artery systolic pressure. The  estimated right ventricular systolic pressure is 32.4 mmHg.   3. The mitral valve is normal in structure. Mild mitral valve  regurgitation. No evidence of mitral stenosis.   4. The aortic valve is normal in structure. Aortic valve regurgitation is  not visualized. No aortic stenosis is present.   5. The inferior vena cava is normal in size with <50% respiratory  variability, suggesting right atrial  pressure of 8 mmHg.   Comparison(s): No prior Echocardiogram.    Patient Profile   74 y.o. female with PMH of HTN, DM II and ILD who went to pulmonology office for management of ILD and was noted to be in new onset of atrial flutter with HR 140s. She was subsequently send to the hospital.   Assessment & Plan   New onset atrial flutter with RVR - normal stress test 2022 - started on BB and Eliquis . TSH normal. - Echo EF 60-65%, no RWMA, normal RV, RVSP 32.4 mmHg, mild MR.  - currently in atrial flutter with variable ventricular response, plan for TEE DCCV today (currently scheduled for 4PM) - HR 110s on diltiazem  and digoxin .  - if stable overnight after cardioversion, likely discharge tomorrow  Mild AKI: patient received lasix  and got hypotensive with AKI, Cr improving.   Hypertension  DM II  IPF: followed by pulmonology service as outpatient. No sign of pulmonary hypertension on echo.   Hypothyroidism    For questions or updates, please contact [facility] Please consult [email or web address] for contact info under     Signed, Scot Ford, PA  10/25/2024, 9:18 AM    "

## 2024-10-25 NOTE — Progress Notes (Signed)
" °  Progress Note   Patient: [name] FMW:[id number] DOB: 1951/08/23 DOA: 10/22/2024     3 DOS: the patient was seen and examined on 10/25/2024   Brief hospital course: 74 y/o female with hx of  ILD (incidental finding-follows Dr. Jude), HTN, GERD, DM-2, hypothyroidism, parathyroid  adenoma-who was referred to the ED by her pulmonologist after she was found to have significant lower extremity edema, shortness of breath and newly diagnosed A-fib/flutter with RVR.  Patient was virtual admission. She was admitted for New onset A-flutter/A-fib with RVR and New onset Heart failure. Case was discussed with cardiology and  Cardiology recommended diltiazem  drip and oral metoprolol . Echocardiogram pending. She was found to have BNP 1329.0. she was given one dose of 20 mg IV lasix . Patient continue to be in A.fib with RVR despite titration of diltiazem  drip. During the night she became hypotensive and was given bolus of IV normal saline. Her blood pressure has improved . She was seen by cardiology and felt that patient did not tolerated metoprolol  which has been discontinued. Patient is digoxin  and diltiazem  Plan for cardioversion today Assessment and Plan: New onset A-flutter/A-fib with RVR  Initially she was started on oral metoprolol  and  diltiazem  drip. But it seem that she did not tolerate metoprolol  which has been discontinued. She is Digoxin  and diltiazem  Plan TEE/ DCCV today   New onset HFpEF versus HFrEF Echocardiogram pending Received dose of lasix . Reached euvolemic state  HTN Initially hypertensive then hypotensive BP has normalized Continue to monitor  DM-2 (A1c 8.0 on 08/02/2024) CBG continue to improve Continue  Lantus   42 units daily Continue insulin  sliding scale  Repeat  hemoglobin A1C 7.6    Idiopathic pulmonary fibrosis Being followed closely in the outpatient setting by Dr. Alva-currently not on any medications  Avoiding amiodarone due to its effect on lung  GERD Continue  PPI    Hypothyroidism Continue usual outpatient regimen Acute kidney Injury  Felt to be secondary to renal hypoperfusion in the setting of hypotension Recent creatine 1.01 and improving  Avoid nephrotoxins. Insomnia  Continue Melatonin prn      Subjective Seen today> She did sleep good. No sob, no chest pain, no headache, no nausea, no vomiting   Physical Exam: Vitals:   10/25/24 0303 10/25/24 0747 10/25/24 1202 10/25/24 1426  BP: (!) 103/53 (!) 136/59 106/72 122/88  Pulse: (!) 140 (!) 103 (!) 146 (!) 121  Resp: 18 18 18  (!) 28  Temp: 98 F (36.7 C) 97.8 F (36.6 C) 98.2 F (36.8 C) 98.4 F (36.9 C)  TempSrc: Oral Oral Oral Oral  SpO2: 96% 96% 97% 96%  Weight:      Height:       HEENT: head atraumatic  Neck supple Resp: decrease breath sound No wheezes. +rales CVS ; s1/s2 + No JVD  ABD: soft, +BS  No tender  Ext: trace edema  Neuro: A&Ox3 No focal neurology deficit  Psych: Moods and affect appropriate Data Reviewed:   Family Communication: No family at bedside   Disposition: Status is: Inpatient Remains inpatient appropriate because: Atrial fib with RVR complicated by acute kidney and hypotension. Schedule for DCCV today . Cardiology is following    Planned Discharge Destination: Home  Time spent: 35 minutes  Author: Jefferson Marinas, MD 10/25/2024 3:17 PM  For on call review [email or web address].  "

## 2024-10-25 NOTE — Anesthesia Preprocedure Evaluation (Addendum)
"                                    Anesthesia Evaluation  Patient identified by MRN, date of birth, ID band Patient awake    Reviewed: Allergy & Precautions, NPO status , Patient's Chart, lab work & pertinent test results  History of Anesthesia Complications (+) PONV and history of anesthetic complications (ponv w/ GA, did well with cscope)  Airway Mallampati: III  TM Distance: >3 FB Neck ROM: Full    Dental  (+) Teeth Intact, Dental Advisory Given   Pulmonary asthma , COPD (albuterol  rescue uses rarely),  COPD inhaler, former smoker Pulmonary fibrosis 40 pack year history, quit 2013 Snores at night , recommended for sleep study    Pulmonary exam normal breath sounds clear to auscultation       Cardiovascular hypertension (136/59 preop), Pt. on medications + dysrhythmias Atrial Fibrillation + Valvular Problems/Murmurs (mild MR) MR  Rhythm:Irregular Rate:Normal  Echo 09/2024 1. Left ventricular ejection fraction, by estimation, is 60 to 65%. Left  ventricular ejection fraction by 2D MOD biplane is 67.2 %. The left  ventricle has normal function. The left ventricle has no regional wall  motion abnormalities. Left ventricular  diastolic parameters were normal.   2. Right ventricular systolic function is normal. The right ventricular  size is normal. There is normal pulmonary artery systolic pressure. The  estimated right ventricular systolic pressure is 32.4 mmHg.   3. The mitral valve is normal in structure. Mild mitral valve  regurgitation. No evidence of mitral stenosis.   4. The aortic valve is normal in structure. Aortic valve regurgitation is  not visualized. No aortic stenosis is present.   5. The inferior vena cava is normal in size with <50% respiratory  variability, suggesting right atrial pressure of 8 mmHg.     Neuro/Psych  Headaches  negative psych ROS   GI/Hepatic Neg liver ROS,GERD  Controlled and Medicated,,  Endo/Other  diabetes, Well  Controlled, Type 2, Insulin  DependentHypothyroidism  FS 114 at 12:15PM  Renal/GU negative Renal ROS  negative genitourinary   Musculoskeletal  (+) Arthritis , Osteoarthritis,    Abdominal   Peds  Hematology negative hematology ROS (+)   Anesthesia Other Findings   Reproductive/Obstetrics negative OB ROS                              Anesthesia Physical Anesthesia Plan  ASA: 3  Anesthesia Plan: MAC   Post-op Pain Management:    Induction: Intravenous  PONV Risk Score and Plan: TIVA and Treatment may vary due to age or medical condition  Airway Management Planned: Natural Airway and Mask  Additional Equipment: None  Intra-op Plan:   Post-operative Plan:   Informed Consent: I have reviewed the patients History and Physical, chart, labs and discussed the procedure including the risks, benefits and alternatives for the proposed anesthesia with the patient or authorized representative who has indicated his/her understanding and acceptance.       Plan Discussed with: CRNA  Anesthesia Plan Comments:          Anesthesia Quick Evaluation  "

## 2024-10-25 NOTE — Progress Notes (Signed)
" °   10/25/24 0002  Assess: MEWS Score  Temp (!) 97.5 F (36.4 C)  BP 127/70  MAP (mmHg) 77  ECG Heart Rate (!) 115  Resp 18  SpO2 92 %  O2 Device Room Air  Assess: MEWS Score  MEWS Temp 0  MEWS Systolic 0  MEWS Pulse 2  MEWS RR 0  MEWS LOC 0  MEWS Score 2  MEWS Score Color Yellow  Assess: if the MEWS score is Yellow or Red  Were vital signs accurate and taken at a resting state? Yes  Does the patient meet 2 or more of the SIRS criteria? No  MEWS guidelines implemented  No, previously yellow, continue vital signs every 4 hours  Assess: SIRS CRITERIA  SIRS Temperature  0  SIRS Respirations  0  SIRS Pulse 1  SIRS WBC 0  SIRS Score Sum  1    "

## 2024-10-26 ENCOUNTER — Other Ambulatory Visit (HOSPITAL_COMMUNITY): Payer: Self-pay

## 2024-10-26 ENCOUNTER — Encounter (HOSPITAL_COMMUNITY): Payer: Self-pay

## 2024-10-26 DIAGNOSIS — I4892 Unspecified atrial flutter: Secondary | ICD-10-CM | POA: Diagnosis not present

## 2024-10-26 DIAGNOSIS — I10 Essential (primary) hypertension: Secondary | ICD-10-CM | POA: Diagnosis not present

## 2024-10-26 LAB — BASIC METABOLIC PANEL WITH GFR
Anion gap: 10 (ref 5–15)
BUN: 16 mg/dL (ref 8–23)
CO2: 27 mmol/L (ref 22–32)
Calcium: 9.1 mg/dL (ref 8.9–10.3)
Chloride: 104 mmol/L (ref 98–111)
Creatinine, Ser: 1.03 mg/dL — ABNORMAL HIGH (ref 0.44–1.00)
GFR, Estimated: 56 mL/min — ABNORMAL LOW
Glucose, Bld: 79 mg/dL (ref 70–99)
Potassium: 3.9 mmol/L (ref 3.5–5.1)
Sodium: 140 mmol/L (ref 135–145)

## 2024-10-26 LAB — CBC
HCT: 38.7 % (ref 36.0–46.0)
Hemoglobin: 12.3 g/dL (ref 12.0–15.0)
MCH: 25.4 pg — ABNORMAL LOW (ref 26.0–34.0)
MCHC: 31.8 g/dL (ref 30.0–36.0)
MCV: 80 fL (ref 80.0–100.0)
Platelets: 220 10*3/uL (ref 150–400)
RBC: 4.84 MIL/uL (ref 3.87–5.11)
RDW: 14.7 % (ref 11.5–15.5)
WBC: 5.3 10*3/uL (ref 4.0–10.5)
nRBC: 0 % (ref 0.0–0.2)

## 2024-10-26 LAB — DIGOXIN LEVEL: Digoxin Level: 1.6 ng/mL (ref 0.8–2.0)

## 2024-10-26 LAB — GLUCOSE, CAPILLARY: Glucose-Capillary: 94 mg/dL (ref 70–99)

## 2024-10-26 MED ORDER — DM-GUAIFENESIN ER 30-600 MG PO TB12
1.0000 | ORAL_TABLET | Freq: Two times a day (BID) | ORAL | Status: DC
  Administered 2024-10-26: 1 via ORAL
  Filled 2024-10-26: qty 1

## 2024-10-26 MED ORDER — APIXABAN 5 MG PO TABS
5.0000 mg | ORAL_TABLET | Freq: Two times a day (BID) | ORAL | 1 refills | Status: AC
  Filled 2024-10-26: qty 60, 30d supply, fill #0

## 2024-10-26 MED ORDER — METOPROLOL TARTRATE 25 MG PO TABS
25.0000 mg | ORAL_TABLET | Freq: Two times a day (BID) | ORAL | 1 refills | Status: AC
  Filled 2024-10-26: qty 60, 30d supply, fill #0

## 2024-10-26 NOTE — Discharge Summary (Signed)
 " Physician Discharge Summary   Patient: [name] MRN: [id number] DOB: 1951/09/26  Admit date:     10/22/2024  Discharge date: 10/26/24  Discharge Physician: [name]   PCP: Brandi Suzzane POUR, MD   Recommendations at discharge:  Primary care physician in 1 week Cardiology  as schedule   Discharge Diagnoses: Principal Problem:   Atrial fibrillation with RVR (HCC) Active Problems:   GERD (gastroesophageal reflux disease)   Type 2 diabetes mellitus with other specified complication (HCC)   Primary hypothyroidism   Essential hypertension   Atrial flutter with rapid ventricular response [facility])    Hospital Course: 74 y/o female with hx of  ILD (incidental finding-follows Dr. Jude), HTN, GERD, DM-2, hypothyroidism, parathyroid  adenoma-who was referred to the ED by her pulmonologist after she was found to have significant lower extremity edema, shortness of breath and newly diagnosed A-fib/flutter with RVR.  Patient was virtual admission. She was admitted for New onset A-flutter/A-fib with RVR and New onset Heart failure. Case was discussed with cardiology and  Cardiology recommended diltiazem  drip and oral metoprolol . Echocardiogram pending. She was found to have BNP 1329.0. she was given one dose of 20 mg IV lasix . Patient continue to be in A.fib with RVR despite titration of diltiazem  drip. During the night she became hypotensive and was given bolus of IV normal saline. Her blood pressure has improved . She was seen by cardiology and felt that patient did not tolerated metoprolol  high dose which has been discontinued. Patient is digoxin  and diltiazem  Successful she underwent Cardioversion on 10/25/24. Patient is back in NSR. Post cardioversion , patient is asymptomatic. Cleared for discharge by cardiology   Assessment and Plan:  New onset A-flutter/A-fib with RVR  Initially she was started on oral metoprolol  and  diltiazem  drip. But it seem that she did not tolerate high dose  metoprolol  50 mg tid which has been discontinued. Started Digoxin  and diltiazem  She underwent successful Cardiac Cardioversion. 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 Reached euvolemic state  TTE> normal Echo and normal EF >60 % HTN Initially hypertensive then hypotensive BP has normalized Continue metoprolol  25 mg Twice daily    DM-2 (A1c 8.0 on 08/02/2024) Resume home regimen    Idiopathic pulmonary fibrosis Being followed closely in the outpatient setting by Dr. Alva-currently not on any medications  Avoiding amiodarone due to its effect on lung  GERD Continue PPI    Hypothyroidism Continue usual outpatient regimen Acute kidney Injury  Imnproved Avoid nephrotoxins. Insomnia  Continue Melatonin prn      Consultants: cardiologist Procedures performed: DCCV   Disposition: Home Diet recommendation:  Cardiac diet DISCHARGE MEDICATION: Allergies as of 10/26/2024       Reactions   Advair Diskus [fluticasone -salmeterol] Anaphylaxis   Codeine Nausea And Vomiting   Hydrocodone  Nausea And Vomiting   Levothyroxine  Other (See Comments)   Caused lower leg swelling   Oxycodone  Nausea And Vomiting   Statins    Muscle and joint pain   Tetracyclines & Related Itching   Internal        Medication List     STOP taking these medications    amLODipine  5 MG tablet Commonly known as: NORVASC        TAKE these medications    Accu-Chek Guide Test test strip Generic drug: glucose blood Use as instructed to monitor glucose twice daily.   Accu-Chek Softclix  Lancets lancets Use as instructed to monitor glucose twice daily.   acetaminophen  500 MG tablet Commonly known as: TYLENOL  Take 500 mg by mouth every 6 (six) hours as needed for mild pain (pain score 1-3).   albuterol  108 (90 Base) MCG/ACT inhaler Commonly known as: VENTOLIN  HFA Inhale 2 puffs into  the lungs every 6 (six) hours as needed for wheezing or shortness of breath.   apixaban  5 MG Tabs tablet Commonly known as: ELIQUIS  Take 1 tablet (5 mg total) by mouth 2 (two) times daily.   blood glucose meter kit and supplies Kit Dispense based on patient and insurance preference. Use twice daily (before breakfast and before bed).   fluticasone  50 MCG/ACT nasal spray Commonly known as: FLONASE  SHAKE LIQUID AND USE 2 SPRAYS IN EACH NOSTRIL DAILY What changed: See the new instructions.   glipiZIDE  2.5 MG 24 hr tablet Commonly known as: GLUCOTROL  XL Take 1 tablet (2.5 mg total) by mouth daily with breakfast.   insulin  glargine 100 unit/mL Sopn Commonly known as: LANTUS  Inject 40 Units into the skin at bedtime.   metoprolol  tartrate 25 MG tablet Commonly known as: LOPRESSOR  Take 1 tablet (25 mg total) by mouth 2 (two) times daily. What changed: See the new instructions.   pantoprazole  40 MG tablet Commonly known as: PROTONIX  TAKE 1 TABLET(40 MG) BY MOUTH TWICE DAILY What changed: See the new instructions.   Pen Needles 31G X 6 MM Misc Use to inject insulin  once daily   thyroid  60 MG tablet Commonly known as: NP Thyroid  TAKE 1 TABLET BY MOUTH EVERY DAY BEFORE BREAKFAST   Vitamin D  (Ergocalciferol ) 1.25 MG (50000 UNIT) Caps capsule Commonly known as: DRISDOL  Take 1 capsule (50,000 Units total) by mouth every 7 (seven) days.        Discharge Exam: Filed Weights   10/22/24 2246  Weight: 72.8 kg    HEENT: head atraumatic  Neck supple Resp: decrease breath sound No wheezes. +rales CVS ; s1/s2 + No JVD  ABD: soft, +BS  No tender  Ext: trace edema  Neuro: A&Ox3 No focal neurology deficit  Psych: Moods and affect appropriate  Condition at discharge: stable  The results of significant diagnostics from this hospitalization (including imaging, microbiology, ancillary and laboratory) are listed below for reference.   Imaging Studies: ECHO TEE Result Date:  10/25/2024    TRANSESOPHOGEAL ECHO REPORT   Patient Name:   [name] Date of Exam: 10/25/2024 Medical Rec #:  [id number]       Height:       66.0 in Accession #:    [id number]      Weight:       160.4 lb Date of Birth:  06-13-1951      BSA:          1.821 m Patient Age:    73 years        BP:           164/80 mmHg Patient Gender: F               HR:           145 bpm. Exam Location:  Inpatient Procedure: Transesophageal Echo, Cardiac Doppler and Color Doppler (Both            Spectral and Color Flow Doppler were utilized during procedure). Indications:    Arrhythmia  History:        Patient has prior history of Echocardiogram examinations, most  recent 10/23/2024. Risk Factors:Diabetes.  Sonographer:    Jayson Gaskins Referring Phys: [id number] [facility] AZOBOU TONLEU PROCEDURE: After discussion of the risks and benefits of a TEE, an informed consent was obtained from the patient. The transesophogeal probe was passed without difficulty through the esophogus of the patient. Sedation performed by different physician. The patient was monitored while under deep sedation. Anesthestetic sedation was provided intravenously by Anesthesiology: 210mg  of Propofol . The patient developed no complications during the procedure. A successful direct current cardioversion was performed at 200 joules with 1 attempt.  IMPRESSIONS  1. Left ventricular ejection fraction, by estimation, is 50 to 55%. The left ventricle has low normal function.  2. Right ventricular systolic function is normal. The right ventricular size is normal.  3. Left atrial size was mildly dilated. No left atrial/left atrial appendage thrombus was detected.  4. The mitral valve is normal in structure. Mild mitral valve regurgitation.  5. The aortic valve is grossly normal. Aortic valve regurgitation is not visualized. FINDINGS  Left Ventricle: Left ventricular ejection fraction, by estimation, is 50 to 55%. The left ventricle has low normal function.  The left ventricular internal cavity size was normal in size. Right Ventricle: The right ventricular size is normal. No increase in right ventricular wall thickness. Right ventricular systolic function is normal. Left Atrium: Left atrial size was mildly dilated. No left atrial/left atrial appendage thrombus was detected. Right Atrium: Right atrial size was normal in size. Pericardium: There is no evidence of pericardial effusion. Mitral Valve: The mitral valve is normal in structure. Mild mitral valve regurgitation. Tricuspid Valve: The tricuspid valve is grossly normal. Tricuspid valve regurgitation is trivial. Aortic Valve: The aortic valve is grossly normal. Aortic valve regurgitation is not visualized. Pulmonic Valve: The pulmonic valve was grossly normal. Pulmonic valve regurgitation is trivial. Aorta: The aortic root, ascending aorta and aortic arch are all structurally normal, with no evidence of dilitation or obstruction. IAS/Shunts: No atrial level shunt detected by color flow Doppler. Joelle Cedars Tonleu Electronically signed by Joelle Cedars Ny Signature Date/Time: 10/25/2024/3:48:25 PM    Final    EP STUDY Result Date: 10/25/2024 See surgical note for result.  ECHOCARDIOGRAM COMPLETE Result Date: 10/23/2024    ECHOCARDIOGRAM REPORT   Patient Name:   AUDREE SCHRECENGOST Date of Exam: 10/23/2024 Medical Rec #:  [id number]       Height:       66.0 in Accession #:    [id number]      Weight:       160.4 lb Date of Birth:  October 06, 1950      BSA:          1.821 m Patient Age:    73 years        BP:           112/71 mmHg Patient Gender: F               HR:           138 bpm. Exam Location:  Inpatient Procedure: 2D Echo (Both Spectral and Color Flow Doppler were utilized during            procedure). Indications:    I48.3 Typical atrial flutter  History:        Patient has prior history of Echocardiogram examinations, most                 recent 11/22/2015. Arrythmias:Atrial Fibrillation.  Sonographer:    Nathanel Devonshire Referring Phys: [id number] DONALDA HERO  GHIMIRE IMPRESSIONS  1. Left ventricular ejection fraction, by estimation, is 60 to 65%. Left ventricular ejection fraction by 2D MOD biplane is 67.2 %. The left ventricle has normal function. The left ventricle has no regional wall motion abnormalities. Left ventricular diastolic parameters were normal.  2. Right ventricular systolic function is normal. The right ventricular size is normal. There is normal pulmonary artery systolic pressure. The estimated right ventricular systolic pressure is 32.4 mmHg.  3. The mitral valve is normal in structure. Mild mitral valve regurgitation. No evidence of mitral stenosis.  4. The aortic valve is normal in structure. Aortic valve regurgitation is not visualized. No aortic stenosis is present.  5. The inferior vena cava is normal in size with <50% respiratory variability, suggesting right atrial pressure of 8 mmHg. Comparison(s): No prior Echocardiogram. FINDINGS  Left Ventricle: Left ventricular ejection fraction, by estimation, is 60 to 65%. Left ventricular ejection fraction by 2D MOD biplane is 67.2 %. The left ventricle has normal function. The left ventricle has no regional wall motion abnormalities. The left ventricular internal cavity size was normal in size. There is no left ventricular hypertrophy. Left ventricular diastolic parameters were normal. Right Ventricle: The right ventricular size is normal. No increase in right ventricular wall thickness. Right ventricular systolic function is normal. There is normal pulmonary artery systolic pressure. The tricuspid regurgitant velocity is 2.71 m/s, and  with an assumed right atrial pressure of 3 mmHg, the estimated right ventricular systolic pressure is 32.4 mmHg. Left Atrium: Left atrial size was normal in size. Right Atrium: Right atrial size was normal in size. Pericardium: There is no evidence of pericardial effusion. Mitral Valve: The mitral valve is normal in structure. Mild  mitral valve regurgitation. No evidence of mitral valve stenosis. Tricuspid Valve: The tricuspid valve is normal in structure. Tricuspid valve regurgitation is mild . No evidence of tricuspid stenosis. Aortic Valve: The aortic valve is normal in structure. Aortic valve regurgitation is not visualized. No aortic stenosis is present. Aortic valve mean gradient measures 3.0 mmHg. Aortic valve peak gradient measures 6.4 mmHg. Aortic valve area, by VTI measures 1.94 cm. Pulmonic Valve: The pulmonic valve was normal in structure. Pulmonic valve regurgitation is not visualized. No evidence of pulmonic stenosis. Aorta: The aortic root is normal in size and structure. Venous: The inferior vena cava is normal in size with less than 50% respiratory variability, suggesting right atrial pressure of 8 mmHg. IAS/Shunts: No atrial level shunt detected by color flow Doppler.  LEFT VENTRICLE PLAX 2D                        Biplane EF (MOD) LVIDd:         3.80 cm         LV Biplane EF:   Left LVIDs:         2.70 cm                          ventricular LV PW:         0.90 cm                          ejection LV IVS:        0.90 cm                          fraction by LVOT diam:  1.70 cm                          2D MOD LV SV:         43                               biplane is LV SV Index:   24                               67.2 %. LVOT Area:     2.27 cm LV IVRT:       60 msec         Diastology                                LV e' medial:   10.20 cm/s                                LV E/e' medial: 12.4 LV Volumes (MOD) LV vol d, MOD    30.0 ml A2C: LV vol d, MOD    38.2 ml A4C: LV vol s, MOD    9.6 ml A2C: LV vol s, MOD    12.6 ml A4C: LV SV MOD A2C:   20.4 ml LV SV MOD A4C:   38.2 ml LV SV MOD BP:    24.0 ml RIGHT VENTRICLE          IVC RV Basal diam:  2.80 cm  IVC diam: 1.50 cm TAPSE (M-mode): 1.5 cm LEFT ATRIUM           Index        RIGHT ATRIUM           Index LA diam:      3.70 cm 2.03 cm/m   RA Area:     17.50 cm LA Vol  (A2C): 26.1 ml 14.33 ml/m  RA Volume:   49.10 ml  26.97 ml/m LA Vol (A4C): 36.6 ml 20.10 ml/m  AORTIC VALVE                    PULMONIC VALVE AV Area (Vmax):    1.87 cm     PV Vmax:       0.85 m/s AV Area (Vmean):   1.82 cm     PV Peak grad:  2.9 mmHg AV Area (VTI):     1.94 cm AV Vmax:           126.00 cm/s AV Vmean:          87.400 cm/s AV VTI:            0.223 m AV Peak Grad:      6.4 mmHg AV Mean Grad:      3.0 mmHg LVOT Vmax:         104.00 cm/s LVOT Vmean:        69.900 cm/s LVOT VTI:          0.191 m LVOT/AV VTI ratio: 0.86  AORTA Ao Root diam: 2.80 cm Ao Asc diam:  2.80 cm MV E velocity: 126.00 cm/s  TRICUSPID VALVE  TR Peak grad:   29.4 mmHg                             TR Vmax:        271.00 cm/s                              SHUNTS                             Systemic VTI:  0.19 m                             Systemic Diam: 1.70 cm Joelle Cedars Tonleu Electronically signed by Joelle Cedars Ny Signature Date/Time: 10/23/2024/4:44:29 PM    Final    DG Chest Portable 1 View Result Date: 10/22/2024 EXAM: 1 VIEW(S) XRAY OF THE CHEST 10/22/2024 01:11:29 PM COMPARISON: 11/13/2023 CLINICAL HISTORY: Shortness of breath. FINDINGS: LUNGS AND PLEURA: Linear subsegmental atelectasis or scarring along both lung bases. No pleural effusion. No pneumothorax. HEART AND MEDIASTINUM: Atheromatous vascular calcification of the aortic arch. No acute abnormality of the cardiac silhouette. The mediastinal silhouette is otherwise unremarkable. BONES AND SOFT TISSUES: ACDF hardware in lower cervical spine. No acute osseous abnormality. Cholecystectomy clips are noted in the upper abdomen, outside the visualized chest. IMPRESSION: 1. Linear subsegmental atelectasis or scarring at both lung bases. 2. Atherosclerotic calcification of the aortic arch. 3. Lower cervical anterior cervical discectomy and fusion hardware. Electronically signed by: Ryan Salvage MD 10/22/2024 01:33 PM EST RP  Workstation: [id number]    Microbiology: Results for orders placed or performed in visit on 11/11/23  COVID-19, Flu A+B and RSV     Status: None   Collection Time: 11/11/23  4:57 PM   Specimen: Nasal Swab  Result Value Ref Range Status   SARS-CoV-2, NAA Not Detected Not Detected Final   Influenza A, NAA Not Detected Not Detected Final   Influenza B, NAA Not Detected Not Detected Final   RSV, NAA Not Detected Not Detected Final   Test Information: Comment  Final    Comment: This nucleic acid amplification test was developed and its performance characteristics determined by [employer]. Nucleic acid amplification tests include RT-PCR and TMA. This test has not been FDA cleared or approved. This test has been authorized by FDA under an Emergency Use Authorization (EUA). This test is only authorized for the duration of time the declaration that circumstances exist justifying the authorization of the emergency use of in vitro diagnostic tests for detection of SARS-CoV-2 virus and/or diagnosis of COVID-19 infection under section 564(b)(1) of the Act, 21 U.S.C. 639aaa-6(a) (1), unless the authorization is terminated or revoked sooner. When diagnostic testing is negative, the possibility of a false negative result should be considered in the context of a patient's recent exposures and the presence of clinical signs and symptoms consistent with COVID-19. An individual without symptoms of COVID-19 and who is not shedding SARS-CoV-2 virus wo uld expect to have a negative (not detected) result in this assay.     Labs: CBC: Recent Labs  Lab 10/22/24 1301 10/23/24 0428 10/24/24 0324 10/25/24 0234 10/26/24 0429  WBC 6.7 8.0 5.7 6.1 5.3  HGB 13.7 12.4 12.3 13.6 12.3  HCT 43.5 39.5 38.9 41.2 38.7  MCV 80.6 80.4 80.0 78.2* 80.0  PLT 243 229  217 230 220   Basic Metabolic Panel: Recent Labs  Lab 10/22/24 1301 10/23/24 0428 10/23/24 1851 10/24/24 1024 10/25/24 1332  10/26/24 0429  NA 139 136  --  138 141 140  K 3.9 4.3  --  4.6 4.0 3.9  CL 102 103  --  103 103 104  CO2 23 22  --  25 26 27   GLUCOSE 255* 201*  --  150* 101* 79  BUN 20 26*  --  19 16 16   CREATININE 1.00 1.29*  --  1.16* 1.01* 1.03*  CALCIUM 9.3 9.3  --  9.2 9.1 9.1  MG 2.0  --  1.8 1.7 1.8  --    Liver Function Tests: Recent Labs  Lab 10/22/24 1301 10/23/24 0428  AST 23 16  ALT 17 16  ALKPHOS 82 67  BILITOT 0.6 0.5  PROT 6.7 6.0*  ALBUMIN 4.6 4.0   CBG: Recent Labs  Lab 10/25/24 0749 10/25/24 1204 10/25/24 1631 10/25/24 2109 10/26/24 0828  GLUCAP 118* 114* 96 180* 94    Discharge time spent: greater than 30 minutes.  Signed: Jefferson Marinas, MD [facility] 10/26/2024 "

## 2024-10-26 NOTE — TOC CM/SW Note (Signed)
 10-26-24 Patient will transition home. Patient is on Eliquis - has no Rx coverage. Unit Pharmacist has assisted patient with the patient assistance application and patient will bring to her next follow up appointment. No further needs identified at this time.

## 2024-10-26 NOTE — Progress Notes (Signed)
"  °  Progress Note  Patient Name: [name] Date of Encounter: 10/26/2024 [facility] Cardiologist: Alvan Carrier, MD   Interval Summary   Doing well today. Reports mild substernal chest discomfort after procedure, better today. No new complaints or concerns. Denies any chest pain, dyspnea, palpitations, lower extremity swelling at this time. Eager to go home  Vital Signs Vitals:   10/25/24 2015 10/25/24 2300 10/26/24 0410 10/26/24 0754  BP: 138/67 132/70 (!) 147/69 (!) 138/56  Pulse: 87  81 87  Resp: 18 14 18 17   Temp: 98 F (36.7 C) 98.2 F (36.8 C) 97.8 F (36.6 C) 97.9 F (36.6 C)  TempSrc: Oral  Oral Oral  SpO2: 96%  96% 99%  Weight:      Height:        Intake/Output Summary (Last 24 hours) at 10/26/2024 0852 Last data filed at 10/26/2024 0413 Gross per 24 hour  Intake 453.16 ml  Output 600 ml  Net -146.84 ml      10/22/2024   10:46 PM 10/22/2024   11:57 AM 10/05/2024   11:51 AM  Last 3 Weights  Weight (lbs) 160 lb 6.4 oz 163 lb 152 lb  Weight (kg) 72.757 kg 73.936 kg 68.947 kg      Telemetry/ECG  Sinus rhythm, rates in the 80s-90s - Personally Reviewed  Physical Exam  GEN: Resting comfortably in bed, on room air, in no acute distress.   Neck: No JVD Cardiac: RRR, no murmurs, rubs, or gallops.  Respiratory: Clear to auscultation bilaterally. GI: Soft, nontender MS: No peripheral edema  Assessment & Plan  New onset atrial flutter with Rvr No chest pain, negative troponins. Normal stress test 2022 Normal TSH, K Echo showed LVEF 60-65%, no RWMA, normal RV, RSVP 32.4 mmHg, mild LAE, mild MR Did not tolerate metoprolol  50 mg TID due to hypotension and vagal symptoms, discontinued and started on digoxin  instead S/p TEE/DCCV 1/26 Discontinue digoxin  Continue metoprolol  25 mg BID Continue Eliquis  5 mg BID (appropriately dosed) Probable discharge today pending MD evaluation  Mild AKI Cr stable around baseline  0.9-1.0  Hypertension Hypertensive on admission then hypotensive with rate control BP stable yesterday/this AM  Per primary Type 2 diabetes mellitus IPF, followed by pulmonology as outpatient Hypothyroidism    For questions or updates, please contact  [facility] Please consult [email or web address] for contact info under         Signed, Owen MARLA Daniels, PA-C   "

## 2024-10-26 NOTE — Progress Notes (Signed)
 Mobility Specialist Progress Note;    10/26/24 1016  Mobility  Activity Ambulated with assistance  Level of Assistance Standby assist, set-up cues, supervision of patient - no hands on  Assistive Device None  Distance Ambulated (ft) 250 ft  Activity Response Tolerated well  Mobility Referral Yes  Mobility visit 1 Mobility  Mobility Specialist Start Time (ACUTE ONLY) 1016  Mobility Specialist Stop Time (ACUTE ONLY) 1026  Mobility Specialist Time Calculation (min) (ACUTE ONLY) 10 min   Pt received standing getting dressed, agreeable to mobility. No physical assistance needed, SV for safety. Pt stated legs felt funny, however continued ambulation with steady gait. Left in room, pt continued to get dressed. Call bell and personal belongings in reach. All needs met.   Florine Oak Mobility Specialist Please Neurosurgeon or [employer] [phone]

## 2024-10-27 ENCOUNTER — Encounter (HOSPITAL_COMMUNITY): Payer: Self-pay

## 2024-10-27 NOTE — Anesthesia Postprocedure Evaluation (Signed)
"   Anesthesia Post Note  Patient: [name]  Procedure(s) Performed: TRANSESOPHAGEAL ECHOCARDIOGRAM CARDIOVERSION     Patient location during evaluation: Cath Lab Anesthesia Type: MAC Pain management: pain level controlled Vital Signs Assessment: post-procedure vital signs reviewed and stable Respiratory status: spontaneous breathing Cardiovascular status: stable Anesthetic complications: no   There were no known notable events for this encounter.  Last Vitals:  Vitals:   10/26/24 0754 10/26/24 1001  BP: (!) 138/56 (!) 140/57  Pulse: 87 88  Resp: 17   Temp: 36.6 C   SpO2: 99%     Last Pain:  Vitals:   10/26/24 0754  TempSrc: Oral  PainSc: 0-No pain                 Norleen Pope      "

## 2024-10-28 ENCOUNTER — Telehealth (HOSPITAL_BASED_OUTPATIENT_CLINIC_OR_DEPARTMENT_OTHER): Payer: Self-pay

## 2024-10-28 ENCOUNTER — Encounter (HOSPITAL_BASED_OUTPATIENT_CLINIC_OR_DEPARTMENT_OTHER): Payer: Self-pay | Admitting: Pulmonary Disease

## 2024-10-28 ENCOUNTER — Ambulatory Visit: Attending: Nurse Practitioner | Admitting: Nurse Practitioner

## 2024-10-28 ENCOUNTER — Telehealth (HOSPITAL_BASED_OUTPATIENT_CLINIC_OR_DEPARTMENT_OTHER): Admitting: Pulmonary Disease

## 2024-10-28 VITALS — BP 135/55 | HR 81 | Ht 66.0 in | Wt 157.8 lb

## 2024-10-28 DIAGNOSIS — R29818 Other symptoms and signs involving the nervous system: Secondary | ICD-10-CM | POA: Diagnosis present

## 2024-10-28 DIAGNOSIS — M79605 Pain in left leg: Secondary | ICD-10-CM | POA: Insufficient documentation

## 2024-10-28 DIAGNOSIS — I10 Essential (primary) hypertension: Secondary | ICD-10-CM | POA: Insufficient documentation

## 2024-10-28 DIAGNOSIS — R600 Localized edema: Secondary | ICD-10-CM | POA: Insufficient documentation

## 2024-10-28 DIAGNOSIS — G4733 Obstructive sleep apnea (adult) (pediatric): Secondary | ICD-10-CM

## 2024-10-28 DIAGNOSIS — I4892 Unspecified atrial flutter: Secondary | ICD-10-CM | POA: Insufficient documentation

## 2024-10-28 DIAGNOSIS — I5032 Chronic diastolic (congestive) heart failure: Secondary | ICD-10-CM | POA: Diagnosis present

## 2024-10-28 DIAGNOSIS — Z87891 Personal history of nicotine dependence: Secondary | ICD-10-CM

## 2024-10-28 DIAGNOSIS — I4891 Unspecified atrial fibrillation: Secondary | ICD-10-CM

## 2024-10-28 DIAGNOSIS — J849 Interstitial pulmonary disease, unspecified: Secondary | ICD-10-CM | POA: Diagnosis not present

## 2024-10-28 DIAGNOSIS — M79604 Pain in right leg: Secondary | ICD-10-CM | POA: Insufficient documentation

## 2024-10-28 DIAGNOSIS — R0609 Other forms of dyspnea: Secondary | ICD-10-CM | POA: Diagnosis present

## 2024-10-28 DIAGNOSIS — E785 Hyperlipidemia, unspecified: Secondary | ICD-10-CM | POA: Diagnosis present

## 2024-10-28 MED ORDER — OMRON 3 SERIES BP MONITOR DEVI: 0 refills | Status: AC

## 2024-10-28 NOTE — Progress Notes (Signed)
 " Cardiology Office Note   Date: 10/28/2024 ID:  Noor Witte Evenson, DOB November 02, 1950, MRN [id number] PCP: Tobie Suzzane POUR, MD  [facility] Providers Cardiologist:  Alvan Carrier, MD     History of Present Illness Shoua Ulloa Lamboy is a 74 y.o. female with a PMH of CHF, A-fib/A-flutter, chest pain, HTN, HLD, hypothyroidism, parathyroid  adenoma, GERD, T2DM, ILD, asthma, leg swelling, who presents today for hospital follow-up.   Last seen by Dr. Alvan on 12/28/2020. At the time, pt had noted some chest pain of unclear etiology and had multiple risk factors for CAD. NST in 2022 was normal.   More recently, she was in the hospital at [facility] a few days ago for for A-fib with RVR. Was previously referred to ED by her pulmonologist for significant BLE, shortness of breath, found to be in A-fib with RVR, was also admitted for new onset CHF. BNP elevated. Was initially on diltiazem  drip, became hypotensive, given IV fluids. Did not tolerate metoprolol  at high dose, this was d/c. Underwent successful DCCV on 10/25/2024, returned to NSR.   She is here for hospital follow-up. Overall doing well. Does admit to some DOE when walking up an incline that has been ongoing since July 4th last year. Does admit to some generalized chest discomfort after her cardioversion, overall not bothersome per her report. Denies any palpitations, syncope, presyncope, dizziness, orthopnea, PND, swelling or significant weight changes, acute bleeding, or claudication. Has not been tested for OSA before. BP well controlled per her report. Does note that Eliquis  may be costly for her. Does admit to some generalized leg pains at times.    ROS: Negative. See HPI.   Studies Reviewed  EKG:  EKG Interpretation Date/Time:  Thursday October 28 2024 10:54:05 EST Ventricular Rate:  72 PR Interval:  164 QRS Duration:  76 QT Interval:  376 QTC Calculation: 411 R Axis:   44  Text Interpretation: Normal sinus rhythm Normal ECG  When compared with ECG of 25-Oct-2024 15:40, No significant change was found Confirmed by Miriam Norris [phone]) on 10/29/2024 3:52:33 PM   TEE 10/25/2024:  1. Left ventricular ejection fraction, by estimation, is 50 to 55%. The  left ventricle has low normal function.   2. Right ventricular systolic function is normal. The right ventricular  size is normal.   3. Left atrial size was mildly dilated. No left atrial/left atrial  appendage thrombus was detected.   4. The mitral valve is normal in structure. Mild mitral valve  regurgitation.   5. The aortic valve is grossly normal. Aortic valve regurgitation is not  visualized.   Echo 10/23/2024:   1. Left ventricular ejection fraction, by estimation, is 60 to 65%. Left  ventricular ejection fraction by 2D MOD biplane is 67.2 %. The left  ventricle has normal function. The left ventricle has no regional wall  motion abnormalities. Left ventricular  diastolic parameters were normal.   2. Right ventricular systolic function is normal. The right ventricular  size is normal. There is normal pulmonary artery systolic pressure. The  estimated right ventricular systolic pressure is 32.4 mmHg.   3. The mitral valve is normal in structure. Mild mitral valve  regurgitation. No evidence of mitral stenosis.   4. The aortic valve is normal in structure. Aortic valve regurgitation is  not visualized. No aortic stenosis is present.   5. The inferior vena cava is normal in size with <50% respiratory  variability, suggesting right atrial pressure of 8 mmHg.   Comparison(s):  No prior Echocardiogram.  Lexiscan  12/2020:  Blood pressure demonstrated a hypertensive response to exercise. The study is normal. This is a low risk study. The left ventricular ejection fraction is hyperdynamic (>65%).   Baseline ECG with inferolateral T wave changes Normal perfusion study with no ischemia or infarct EF 87%   Risk Assessment/Calculations  CHA2DS2-VASc Score =  5   This indicates a 7.2% annual risk of stroke. The patient's score is based upon: CHF History: 0 HTN History: 1 Diabetes History: 1 Stroke History: 0 Vascular Disease History: 0 Age Score: 2 Gender Score: 1     The 10-year ASCVD risk score (Arnett DK, et al., 2019) is: 39.5%   Values used to calculate the score:     Age: 64 years     Clinically relevant sex: Female     Is Non-Hispanic African American: No     Diabetic: Yes     Tobacco smoker: No     Systolic Blood Pressure: 135 mmHg     Is BP treated: Yes     HDL Cholesterol: 33 mg/dL     Total Cholesterol: 192 mg/dL    STOP-Bang Score:  2  {  Physical Exam VS:  BP (!) 135/55 (BP Location: Left Arm)   Pulse 81   Ht 5' 6 (1.676 m)   Wt 157 lb 12.8 oz (71.6 kg)   SpO2 98%   BMI 25.47 kg/m        Wt Readings from Last 3 Encounters:  10/28/24 157 lb 12.8 oz (71.6 kg)  10/22/24 160 lb 6.4 oz (72.8 kg)  10/22/24 163 lb (73.9 kg)    GEN: Well nourished, well developed in no acute distress NECK: No JVD; No carotid bruits CARDIAC: S1/S2, RRR, no murmurs, rubs, gallops RESPIRATORY:  Clear to auscultation without rales, wheezing or rhonchi  ABDOMEN: Soft, non-tender, non-distended EXTREMITIES:  No edema; No deformity   ASSESSMENT AND PLAN  HFpEF, DOE Stage C, NYHA class I-II symptoms. NICM. Euvolemic and well compensated on exam. Continue Lopressor . No indication for diuretic regimen at this time. Low sodium diet, fluid restriction <2L, and daily weights encouraged. Educated to contact our office for weight gain of 2 lbs overnight or 5 lbs in one week.  A-fib/A-flutter, s/p DCCV Denies any recurrent tachycardia or palpitations. HR is well controlled and she remains in NSR. Continue Lopressor . Continue Eliquis  and she will let us  know if she would like to switch to coumadin d/t cost. Will provide info with assistance. Heart healthy diet discussed. Discussed avoiding triggers to A-fib.   HTN BP is stable on recheck.  Discussed to monitor BP at home at least 2 hours after medications and sitting for 5-10 minutes. No medication changes at this time. Heart healthy diet and regular cardiovascular exercise encouraged. Will provide her with an Rx for a BP cuff.   HLD LDL was 129 from 05/2024. This is being managed by her PCP. Continue to follow with PCP. Heart healthy diet and regular cardiovascular exercise encouraged.   Leg pains, leg swelling Etiology unclear. Leg swelling has resolved per patient's report - no edema on exam. Most recent labs are unremarkable. Not addressed, but if no improvement with leg pains by next office visit, plan to arrange ABI's.   6. Suspected sleep apnea Will place referral to for evaluation of OSA.     Dispo: Follow-up with MD/APP in 6 weeks or sooner if anything changes.   Signed, Almarie Crate, NP   "

## 2024-10-28 NOTE — Telephone Encounter (Signed)
 Appt made for pt    Copied from CRM #[id number]. Topic: Clinical - Request for Lab/Test Order >> Oct 28, 2024 12:51 PM Celestine FALCON wrote: Reason for CRM: Pt was told she needed to have a sleep apnea test completed after her recent visit with Dr. Jude resulting in the pt needing to be seen at ED for heart concerns. Pt was told she could have it completed in [place] at the [place] clinic, but she wanted to make sure Dr. Jude didn't oppose and want her to have it done at [facility] exclusively.   Please call the pt back at phone number [phone] ok to leave a vm.

## 2024-10-28 NOTE — Progress Notes (Signed)
 I connected with  [name] on 10/28/24 by video enabled telemedicine application and verified that I am speaking with the correct person using two identifiers.     Location: Patient: Home Provider: Office - [facility] [employer]   I discussed the limitations of evaluation and management by telemedicine and the availability of in person appointments. The patient expressed understanding and agreed to proceed. I also discussed with the patient that there may be a patient responsible charge related to this service. The patient expressed understanding and agreed to proceed.   Patient consented to consult : Yes People present and their role in pt care: Pt   74 yo remote smoker for FU of ILD incidentally detected on screening CT scan.    PMH -hypertension Parathyroidectomy Diabetes type 2 Asthma She smoked about 20 pack years before she quit 2013   Initial OV 11/2022 >>On ambulation oxygen saturation stayed at 98% after 3 laps She did not want to start medication at this time after much discussion.    Discussed the use of AI scribe software for clinical note transcription with the patient, who gave verbal consent to proceed.  History of Present Illness [name] is a 74 year old female with atrial fibrillation who presents for evaluation of obstructive sleep apnea. She was referred by her cardiologist for evaluation of obstructive sleep apnea due to concerns about oxygen levels dropping during sleep.  She is evaluated for possible obstructive sleep apnea due to concern for nocturnal oxygen desaturation. She has not been told she snores.no bed partner for history  She usually sleeps from about 10-11 PM to 6-7 AM. She does not nap, feels rested on waking, and has good daytime energy.  She had prior swelling attributed to fluid retention, which has resolved.  ESS9   Significant tests/ events reviewed     PFTs 12/2023 FVC 92% , DLCO 76% , kco 90 PFTs  01/2023 FVC 86%, TLC 95%, DLCO 17.9/84% -normal lung function   HRCT chest 06/2024 >> ILD  improved from 2024 , similar to 12/2023 HRCT chest 12/2023 >> stable HRCT chest 12/2022 probable UIP, resolving nodules   LDCT chest 10/22/22 >>widespread areas of peripheral predominant ground-glass attenuation, septal thickening, subpleural reticulation and regional areas of architectural distortion scattered throughout the mid to lower lungs bilaterally, nodular area RLL 10mm indeterminate pattern , mild emphysema   Assessment and Plan Assessment & Plan Evaluation for obstructive sleep apnea Cardiologist recommended evaluation due to atrial fibrillation and potential nocturnal oxygen desaturation. No daytime fatigue reported. Home sleep test planned to assess for obstructive sleep apnea and its impact on cardiac rhythm. - Ordered home sleep test to evaluate for obstructive sleep apnea - Will review results of home sleep test in approximately two weeks  Interstitial lung disease -stable  AF new onset - resolved, back in nsR HFpEF - resolved  Total time x 71m

## 2024-10-28 NOTE — Patient Instructions (Signed)
 Medication Instructions:   Continue all current medications.   Labwork:  none  Testing/Procedures:  none  Follow-Up:  6 weeks   Any Other Special Instructions Will Be Listed Below (If Applicable).  BP log - can update the office in 2-3 weeks via mychart  Omron BP cuff - new prescription sent to pharmacy today  [facility]  CHF info  You have been referred to:  Pulmonology   If you need a refill on your cardiac medications before your next appointment, please call your pharmacy.

## 2024-11-01 ENCOUNTER — Ambulatory Visit: Admitting: Internal Medicine

## 2024-11-01 ENCOUNTER — Other Ambulatory Visit (HOSPITAL_COMMUNITY): Payer: Self-pay

## 2024-11-01 DIAGNOSIS — I4891 Unspecified atrial fibrillation: Secondary | ICD-10-CM | POA: Insufficient documentation

## 2024-11-02 ENCOUNTER — Other Ambulatory Visit (HOSPITAL_COMMUNITY): Payer: Self-pay

## 2024-11-02 ENCOUNTER — Telehealth: Payer: Self-pay | Admitting: Pharmacy Technician

## 2024-11-05 ENCOUNTER — Encounter: Payer: Self-pay | Admitting: Internal Medicine

## 2024-11-05 ENCOUNTER — Ambulatory Visit: Admitting: Internal Medicine

## 2024-11-05 VITALS — BP 160/70 | HR 89 | Ht 66.0 in | Wt 160.0 lb

## 2024-11-05 DIAGNOSIS — I10 Essential (primary) hypertension: Secondary | ICD-10-CM

## 2024-11-05 DIAGNOSIS — K219 Gastro-esophageal reflux disease without esophagitis: Secondary | ICD-10-CM

## 2024-11-05 DIAGNOSIS — Z23 Encounter for immunization: Secondary | ICD-10-CM

## 2024-11-05 DIAGNOSIS — E1169 Type 2 diabetes mellitus with other specified complication: Secondary | ICD-10-CM

## 2024-11-05 DIAGNOSIS — I5032 Chronic diastolic (congestive) heart failure: Secondary | ICD-10-CM | POA: Insufficient documentation

## 2024-11-05 DIAGNOSIS — I4891 Unspecified atrial fibrillation: Secondary | ICD-10-CM

## 2024-11-05 DIAGNOSIS — M5020 Other cervical disc displacement, unspecified cervical region: Secondary | ICD-10-CM

## 2024-11-05 DIAGNOSIS — J439 Emphysema, unspecified: Secondary | ICD-10-CM

## 2024-11-05 DIAGNOSIS — E039 Hypothyroidism, unspecified: Secondary | ICD-10-CM

## 2024-11-05 MED ORDER — FUROSEMIDE 20 MG PO TABS: 20.0000 mg | ORAL_TABLET | Freq: Every day | ORAL | 3 refills | Status: AC | PRN

## 2024-11-05 MED ORDER — TRAMADOL HCL 50 MG PO TABS: 50.0000 mg | ORAL_TABLET | Freq: Three times a day (TID) | ORAL | 0 refills | Status: AC | PRN

## 2024-11-05 MED ORDER — AMLODIPINE BESYLATE 5 MG PO TABS: 5.0000 mg | ORAL_TABLET | Freq: Every day | ORAL | 11 refills | Status: AC

## 2024-11-05 NOTE — Assessment & Plan Note (Signed)
 Was recently admitted with volume overload and A flutter with RVR Last echocardiogram reviewed Lasix  as needed for leg swelling, advised to take it if she notices more than 2 lbs weight gain in a day or 5 lbs in a week Continue to follow DASH diet Followed by cardiology

## 2024-11-05 NOTE — Assessment & Plan Note (Addendum)
 BP Readings from Last 1 Encounters:  11/05/24 (!) 160/70   Uncontrolled with metoprolol  25 mg BID Restart Amlodipine  5 mg QD Lasix  20 mg as needed for leg swelling Counseled for compliance with the medications Advised DASH diet and moderate exercise/walking, at least 150 mins/week

## 2024-11-05 NOTE — Assessment & Plan Note (Signed)
 Lab Results  Component Value Date   HGBA1C 7.6 (H) 10/23/2024   Uncontrolled, but improving Associated with HLD and HTN On Lantus  40 U qHS and glipizide  2.5 mg QD Followed by endocrinology Advised to follow strict low-carb diet for now Advised to follow up with Ophthalmology for diabetic eye exam

## 2024-11-05 NOTE — Assessment & Plan Note (Addendum)
 History of cervical spine surgeries Reports recent worsening of neck pain, radiating to bilateral shoulder area Has been taking Tylenol  without much relief Unable to take oral NSAIDs as she takes Eliquis  currently Prescribed tramadol  as needed for severe pain

## 2024-11-05 NOTE — Assessment & Plan Note (Signed)
 Recent hospitalization for atrial flutter/fibrillation, status post cardioversion Rate controlled with metoprolol  25 mg twice daily On Eliquis  for AC, but planned to switch to Coumadin due to cost concern (pays out-of-pocket for her medicines) Followed by cardiology, planned to see [facility] clinic

## 2024-11-05 NOTE — Assessment & Plan Note (Signed)
 Noted on CT chest Has quit smoking Did not have benefit of bronchodilators noted on PFTs Followed by pulmonology

## 2024-11-05 NOTE — Progress Notes (Signed)
 "  Established Patient Office Visit  Subjective:  Patient ID: [name], female    DOB: [birth year]  Age: 74 y.o. MRN: [id number]  CC:  Chief Complaint  Patient presents with   Medical Management of Chronic Issues    6 month follow up     HPI [name] is a 74 y.o. female with past medical history of HTN, GERD, type II DM, hypothyroidism, HLD and parathyroid  adenoma who presents for f/u of her chronic medical conditions.  She was admitted at [facility] recently on 10/23/23, where she was found to have significant lower extremity edema, shortness of breath and newly diagnosed A-fib/flutter with RVR. She was admitted for New onset A-flutter/A-fib with RVR and New onset Heart failure. nitially she was started on oral metoprolol  and  diltiazem  drip. But it seem that she did not tolerate high dose metoprolol  50 mg tid which has been discontinued.  Later, started Digoxin  and diltiazem . She underwent successful Cardiac Cardioversion. Her medication has been adjusted to metoprolol  25 mg twice Digoxin  and diltiazem  discontinued.  Echocardiogram (TEE) revealed LVEF of 50-55%.  HTN: BP is elevated today. Takes metoprolol  25 mg BID regularly.  Amlodipine  was recently discontinued from hospitalization.  Patient denies headache, dizziness, chest pain, dyspnea or palpitations.  She is still on NP thyroid  for hypothyroidism.  Followed by endocrinology.  GERD: She has had better response for epigastric pain with pantoprazole  compared to omeprazole .  She was seen by GI as well. Denies any nausea or vomiting.  Her US  of abdomen 01/23 showed hepatic steatosis.  Nephrolithiasis: She had cystoscopy with stent placement in 07/23. She denies any gross hematuria currently.  Denies any fever, chills, nausea, vomiting or urinary hesitancy.  Past Medical History:  Diagnosis Date   Allergy    Seasonal   Arthritis    Asthma    takes Singulair  nightly   Diabetes mellitus    takes levemir  daily  and Metformin  daily   Generalized headaches    GERD (gastroesophageal reflux disease)    takes Omeprazole  daily   Herniated disc, cervical    History of kidney stones    Hyperlipidemia    takes Welchol  daily   Hypertension    takes Enalapril  dialy   Hypothyroidism    takes Synthroid  daily   IBS (irritable bowel syndrome)    IPF (idiopathic pulmonary fibrosis) (HCC) 2024   Joint pain    hands and neck   Nocturia    PONV (postoperative nausea and vomiting)    TIA (transient ischemic attack)    diagnosed by EEG but never knew she had it   Trigger finger    Weakness    Weakness    in both hands    Past Surgical History:  Procedure Laterality Date   ABDOMINAL HYSTERECTOMY  1980   ANTERIOR CERVICAL DECOMP/DISCECTOMY FUSION N/A 10/18/2013   Procedure: Cervical three-four, Cervical four-five, Cervical seven-Thoracic one anterior cervical decompression with fusion plating and bonegraft;  Surgeon: Lamar LELON Peaches, MD;  Location: [facility];  Service: Neurosurgery;  Laterality: N/A;  Cervical three-four, Cervical four-five, Cervical seven-Thoracic one anterior cervical decompression with fusion plating and bonegraft   APPENDECTOMY     done with hysterectomy   BACK SURGERY  2007   lumbar fusion   BIOPSY  03/19/2023   Procedure: BIOPSY;  Surgeon: Eartha Angelia Sieving, MD;  Location: [facility];  Service: Gastroenterology;;   CARDIOVERSION N/A 10/25/2024   Procedure: CARDIOVERSION;  Surgeon: Ren Ny, Joelle DEL,  MD;  Location: [facility];  Service: Cardiovascular;  Laterality: N/A;   carpel tunnel Bilateral    left first then right   cataract implants     CHOLECYSTECTOMY N/A 11/22/2021   Procedure: LAPAROSCOPIC CHOLECYSTECTOMY;  Surgeon: Evonnie Dorothyann LABOR, DO;  Location: [facility];  Service: General;  Laterality: N/A;   COLONOSCOPY     COLONOSCOPY WITH PROPOFOL  N/A 11/27/2022   Procedure: COLONOSCOPY WITH PROPOFOL ;  Surgeon: Eartha Angelia Sieving, MD;   Location: [facility];  Service: Gastroenterology;  Laterality: N/A;  10:00am, asa 1-2   CYSTOSCOPY WITH RETROGRADE PYELOGRAM, URETEROSCOPY AND STENT PLACEMENT Left 04/08/2022   Procedure: CYSTOSCOPY WITH RETROGRADE PYELOGRAM, URETEROSCOPY AND STENT PLACEMENT;  Surgeon: Matilda Senior, MD;  Location: [facility];  Service: Urology;  Laterality: Left;   ESOPHAGOGASTRODUODENOSCOPY     hx of h pylori   ESOPHAGOGASTRODUODENOSCOPY (EGD) WITH PROPOFOL  N/A 03/19/2023   Procedure: ESOPHAGOGASTRODUODENOSCOPY (EGD) WITH PROPOFOL ;  Surgeon: Eartha Angelia Sieving, MD;  Location: [facility];  Service: Gastroenterology;  Laterality: N/A;  10:30am;asa 2   EYE SURGERY  11/18/2008   right - cataracts   EYE SURGERY  06/01/2009   left - cataracts   herniated disc   10/2000, 08/31/2008   C5-C6 then C6-C7   HOLMIUM LASER APPLICATION Left 04/08/2022   Procedure: HOLMIUM LASER APPLICATION;  Surgeon: Matilda Senior, MD;  Location: [facility];  Service: Urology;  Laterality: Left;   IR URETERAL STENT RIGHT NEW ACCESS W/O SEP NEPHROSTOMY CATH  07/26/2021   knee anthroscopy bilateral Bilateral    KNEE ARTHROSCOPY     LITHOTRIPSY  11/12/2007   right side   NEPHROLITHOTOMY Right 07/26/2021   Procedure: NEPHROLITHOTOMY PERCUTANEOUS;  Surgeon: Matilda Senior, MD;  Location: [facility];  Service: Urology;  Laterality: Right;   Parathyroid  surgery     October 2013   PARATHYROIDECTOMY  07/02/2012   Procedure: PARATHYROIDECTOMY;  Surgeon: Krystal CHRISTELLA Spinner, MD;  Location: [facility];  Service: General;  Laterality: N/A;   SPINE SURGERY     TONSILLECTOMY     as a child   TRANSESOPHAGEAL ECHOCARDIOGRAM (CATH LAB) N/A 10/25/2024   Procedure: TRANSESOPHAGEAL ECHOCARDIOGRAM;  Surgeon: Ren Donley Joelle VEAR, MD;  Location: [facility];  Service: Cardiovascular;  Laterality: N/A;    Family History  Problem Relation Age of Onset   Cancer Mother        pancreatic    Social  History   Socioeconomic History   Marital status: Widowed    Spouse name: Not on file   Number of children: Not on file   Years of education: Not on file   Highest education level: GED or equivalent  Occupational History   Not on file  Tobacco Use   Smoking status: Former    Current packs/day: 0.00    Average packs/day: 1 pack/day for 40.0 years (40.0 ttl pk-yrs)    Types: Cigarettes    Start date: 07/19/1972    Quit date: 07/19/2012    Years since quitting: 12.3    Passive exposure: Past   Smokeless tobacco: Never   Tobacco comments:    quit smoking in Oct 2013  Vaping Use   Vaping status: Never Used  Substance and Sexual Activity   Alcohol use: Not Currently    Comment: Very very rarely. Glass of wine or 1 mixed drink   Drug use: Never   Sexual activity: Not Currently    Birth control/protection: Surgical  Other Topics Concern  Not on file  Social History Narrative   Widow,husband died 14-Oct-2020after 35 years marriage.Retired.   Social Drivers of Health   Tobacco Use: Medium Risk (11/05/2024)   Patient History    Smoking Tobacco Use: Former    Smokeless Tobacco Use: Never    Passive Exposure: Past  Physicist, Medical Strain: Low Risk (10/28/2024)   Overall Financial Resource Strain (CARDIA)    Difficulty of Paying Living Expenses: Not hard at all  Food Insecurity: No Food Insecurity (10/28/2024)   Epic    Worried About Programme Researcher, Broadcasting/film/video in the Last Year: Never true    Ran Out of Food in the Last Year: Never true  Transportation Needs: No Transportation Needs (10/28/2024)   Epic    Lack of Transportation (Medical): No    Lack of Transportation (Non-Medical): No  Physical Activity: Inactive (10/28/2024)   Exercise Vital Sign    Days of Exercise per Week: 0 days    Minutes of Exercise per Session: Not on file  Stress: Stress Concern Present (10/28/2024)   [employer] of Occupational Health - Occupational Stress Questionnaire    Feeling of Stress: To  some extent  Social Connections: Socially Isolated (10/28/2024)   Social Connection and Isolation Panel    Frequency of Communication with Friends and Family: Once a week    Frequency of Social Gatherings with Friends and Family: Once a week    Attends Religious Services: More than 4 times per year    Active Member of [employer] or Organizations: No    Attends Banker Meetings: Not on file    Marital Status: Widowed  Intimate Partner Violence: Not At Risk (10/22/2024)   Epic    Fear of Current or Ex-Partner: No    Emotionally Abused: No    Physically Abused: No    Sexually Abused: No  Depression (PHQ2-9): Low Risk (10/05/2024)   Depression (PHQ2-9)    PHQ-2 Score: 0  Alcohol Screen: Low Risk (10/28/2024)   Alcohol Screen    Last Alcohol Screening Score (AUDIT): 1  Housing: Low Risk (10/28/2024)   Epic    Unable to Pay for Housing in the Last Year: No    Number of Times Moved in the Last Year: 0    Homeless in the Last Year: No  Utilities: Not At Risk (10/22/2024)   Epic    Threatened with loss of utilities: No  Health Literacy: Adequate Health Literacy (10/05/2024)   B1300 Health Literacy    Frequency of need for help with medical instructions: Never    Outpatient Medications Prior to Visit  Medication Sig Dispense Refill   Accu-Chek Softclix Lancets lancets Use as instructed to monitor glucose twice daily. 200 each 2   acetaminophen  (TYLENOL ) 500 MG tablet Take 500 mg by mouth every 6 (six) hours as needed for mild pain (pain score 1-3).     albuterol  (VENTOLIN  HFA) 108 (90 Base) MCG/ACT inhaler Inhale 2 puffs into the lungs every 6 (six) hours as needed for wheezing or shortness of breath. 8 g 6   apixaban  (ELIQUIS ) 5 MG TABS tablet Take 1 tablet (5 mg total) by mouth 2 (two) times daily. 60 tablet 1   blood glucose meter kit and supplies KIT Dispense based on patient and insurance preference. Use twice daily (before breakfast and before bed). 1 each 0   Blood Pressure  Monitoring (OMRON 3 SERIES BP MONITOR) DEVI Use as directed 1 each 0   fluticasone  (FLONASE ) 50 MCG/ACT nasal  spray SHAKE LIQUID AND USE 2 SPRAYS IN EACH NOSTRIL DAILY 16 g 1   glipiZIDE  (GLUCOTROL  XL) 2.5 MG 24 hr tablet Take 1 tablet (2.5 mg total) by mouth daily with breakfast. 90 tablet 3   glucose blood (ACCU-CHEK GUIDE TEST) test strip Use as instructed to monitor glucose twice daily. 200 each 11   insulin  glargine (LANTUS ) 100 unit/mL SOPN Inject 40 Units into the skin at bedtime. 36 mL 3   Insulin  Pen Needle (PEN NEEDLES) 31G X 6 MM MISC Use to inject insulin  once daily 100 each 3   metoprolol  tartrate (LOPRESSOR ) 25 MG tablet Take 1 tablet (25 mg total) by mouth 2 (two) times daily. 60 tablet 1   pantoprazole  (PROTONIX ) 40 MG tablet TAKE 1 TABLET(40 MG) BY MOUTH TWICE DAILY 60 tablet 2   thyroid  (NP THYROID ) 60 MG tablet TAKE 1 TABLET BY MOUTH EVERY DAY BEFORE BREAKFAST 90 tablet 3   Vitamin D , Ergocalciferol , (DRISDOL ) 1.25 MG (50000 UNIT) CAPS capsule Take 1 capsule (50,000 Units total) by mouth every 7 (seven) days. 12 capsule 3   No facility-administered medications prior to visit.    Allergies  Allergen Reactions   Advair Diskus [Fluticasone -Salmeterol] Anaphylaxis   Codeine Nausea And Vomiting   Hydrocodone  Nausea And Vomiting   Levothyroxine  Other (See Comments)    Caused lower leg swelling   Oxycodone  Nausea And Vomiting   Statins     Muscle and joint pain   Tetracyclines & Related Itching    Internal    ROS Review of Systems  Constitutional:  Negative for chills and fever.  HENT:  Negative for congestion, sinus pressure, sinus pain and sore throat.   Eyes:  Negative for pain and discharge.  Respiratory:  Negative for cough and shortness of breath.   Cardiovascular:  Negative for chest pain and palpitations.  Gastrointestinal:  Negative for diarrhea, nausea and vomiting.  Endocrine: Negative for polydipsia and polyuria.  Genitourinary:  Negative for dysuria and  hematuria.  Musculoskeletal:  Positive for arthralgias, myalgias and neck pain. Negative for neck stiffness.  Skin:  Negative for rash.  Neurological:  Negative for dizziness and weakness.  Psychiatric/Behavioral:  Negative for agitation and behavioral problems.       Objective:    Physical Exam Vitals reviewed.  Constitutional:      General: She is not in acute distress.    Appearance: She is not diaphoretic.  HENT:     Head: Normocephalic and atraumatic.     Nose: Nose normal.     Mouth/Throat:     Mouth: Mucous membranes are moist.  Eyes:     General: No scleral icterus.    Extraocular Movements: Extraocular movements intact.  Neck:     Comments: Scar noted, site C/D/I Cardiovascular:     Rate and Rhythm: Normal rate and regular rhythm.     Pulses: Normal pulses.     Heart sounds: Normal heart sounds. No murmur heard. Pulmonary:     Breath sounds: Normal breath sounds. No wheezing or rales.  Musculoskeletal:     Cervical back: Neck supple. Tenderness present. Pain with movement present.     Right lower leg: No edema.     Left lower leg: No edema.  Skin:    General: Skin is warm.     Findings: No rash.  Neurological:     General: No focal deficit present.     Mental Status: She is alert and oriented to person, place, and time.  Psychiatric:  Mood and Affect: Mood normal.        Behavior: Behavior normal.     BP (!) 160/70 (BP Location: Right Arm)   Pulse 89   Ht 5' 6 (1.676 m)   Wt 160 lb (72.6 kg)   SpO2 (!) 89%   BMI 25.82 kg/m  Wt Readings from Last 3 Encounters:  11/05/24 160 lb (72.6 kg)  10/28/24 157 lb 12.8 oz (71.6 kg)  10/22/24 160 lb 6.4 oz (72.8 kg)    Lab Results  Component Value Date   TSH 1.940 10/22/2024   Lab Results  Component Value Date   WBC 5.3 10/26/2024   HGB 12.3 10/26/2024   HCT 38.7 10/26/2024   MCV 80.0 10/26/2024   PLT 220 10/26/2024   Lab Results  Component Value Date   NA 140 10/26/2024   K 3.9 10/26/2024    CO2 27 10/26/2024   GLUCOSE 79 10/26/2024   BUN 16 10/26/2024   CREATININE 1.03 (H) 10/26/2024   BILITOT 0.5 10/23/2024   ALKPHOS 67 10/23/2024   AST 16 10/23/2024   ALT 16 10/23/2024   PROT 6.0 (L) 10/23/2024   ALBUMIN 4.0 10/23/2024   CALCIUM 9.1 10/26/2024   ANIONGAP 10 10/26/2024   EGFR 67 03/09/2024   Lab Results  Component Value Date   CHOL 192 06/23/2024   Lab Results  Component Value Date   HDL 33 (L) 06/23/2024   Lab Results  Component Value Date   LDLCALC 129 (H) 06/23/2024   Lab Results  Component Value Date   TRIG 168 (H) 06/23/2024   Lab Results  Component Value Date   CHOLHDL 5.8 (H) 06/23/2024   Lab Results  Component Value Date   HGBA1C 7.6 (H) 10/23/2024      Assessment & Plan:   Problem List Items Addressed This Visit       Cardiovascular and Mediastinum   Essential hypertension   BP Readings from Last 1 Encounters:  11/05/24 (!) 160/70   Uncontrolled with metoprolol  25 mg BID Restart Amlodipine  5 mg QD Lasix  20 mg as needed for leg swelling Counseled for compliance with the medications Advised DASH diet and moderate exercise/walking, at least 150 mins/week      Relevant Medications   amLODipine  (NORVASC ) 5 MG tablet   furosemide  (LASIX ) 20 MG tablet   Atrial fibrillation status post cardioversion (HCC) - Primary   Recent hospitalization for atrial flutter/fibrillation, status post cardioversion Rate controlled with metoprolol  25 mg twice daily On Eliquis  for AC, but planned to switch to Coumadin due to cost concern (pays out-of-pocket for her medicines) Followed by cardiology, planned to see [facility] clinic      Relevant Medications   amLODipine  (NORVASC ) 5 MG tablet   furosemide  (LASIX ) 20 MG tablet   Chronic heart failure with preserved ejection fraction (HFpEF) (HCC)   Was recently admitted with volume overload and A flutter with RVR Last echocardiogram reviewed Lasix  as needed for leg swelling, advised to take it if she  notices more than 2 lbs weight gain in a day or 5 lbs in a week Continue to follow DASH diet Followed by cardiology      Relevant Medications   amLODipine  (NORVASC ) 5 MG tablet   furosemide  (LASIX ) 20 MG tablet     Respiratory   Pulmonary emphysema (HCC)   Noted on CT chest Has quit smoking Did not have benefit of bronchodilators noted on PFTs Followed by pulmonology        Digestive  GERD (gastroesophageal reflux disease)   Well-controlled with pantoprazole  40 mg QD Avoid hot and spicy food        Endocrine   Type 2 diabetes mellitus with other specified complication (HCC)   Lab Results  Component Value Date   HGBA1C 7.6 (H) 10/23/2024   Uncontrolled, but improving Associated with HLD and HTN On Lantus  40 U qHS and glipizide  2.5 mg QD Followed by endocrinology Advised to follow strict low-carb diet for now Advised to follow up with Ophthalmology for diabetic eye exam      Relevant Orders   Urine Microalbumin w/creat. ratio   Primary hypothyroidism   Lab Results  Component Value Date   TSH 1.940 10/22/2024   On NP thyroid  60 mg QD Followed by Endocrinology        Musculoskeletal and Integument   HNP (herniated nucleus pulposus), cervical   Reports recent worsening of neck pain, radiating to bilateral shoulder area Has been taking Tylenol  without much relief Unable to take oral NSAIDs as she takes Eliquis  currently Prescribed tramadol  as needed for severe pain      Relevant Medications   traMADol  (ULTRAM ) 50 MG tablet   Other Visit Diagnoses       Encounter for immunization       Relevant Orders   Pneumococcal conjugate vaccine 20-valent (Completed)         Meds ordered this encounter  Medications   traMADol  (ULTRAM ) 50 MG tablet    Sig: Take 1 tablet (50 mg total) by mouth every 8 (eight) hours as needed.    Dispense:  30 tablet    Refill:  0   amLODipine  (NORVASC ) 5 MG tablet    Sig: Take 1 tablet (5 mg total) by mouth daily.     Dispense:  30 tablet    Refill:  11   furosemide  (LASIX ) 20 MG tablet    Sig: Take 1 tablet (20 mg total) by mouth daily as needed for fluid.    Dispense:  30 tablet    Refill:  3    Follow-up: Return in about 6 months (around 05/05/2025).    Suzzane MARLA Blanch, MD "

## 2024-11-05 NOTE — Assessment & Plan Note (Signed)
 Lab Results  Component Value Date   TSH 1.940 10/22/2024   On NP thyroid  60 mg QD Followed by Endocrinology

## 2024-11-05 NOTE — Assessment & Plan Note (Signed)
 Well-controlled with pantoprazole  40 mg QD Avoid hot and spicy food

## 2024-11-05 NOTE — Patient Instructions (Addendum)
 Please start taking Amlodipine  as prescribed.  Please take Lasix  as needed for leg swelling. Please take Lasix  if you notice more than 2 lbs weight gain in a day or 5 lbs in a week.  Please continue to take other medications as prescribed.  Please continue to follow low salt diet and perform moderate exercise/walking as tolerated.

## 2024-11-08 ENCOUNTER — Ambulatory Visit: Admitting: Nurse Practitioner

## 2024-11-08 ENCOUNTER — Ambulatory Visit

## 2024-11-30 ENCOUNTER — Ambulatory Visit: Admitting: Nurse Practitioner

## 2024-12-07 ENCOUNTER — Ambulatory Visit: Admitting: Nurse Practitioner

## 2024-12-14 ENCOUNTER — Ambulatory Visit: Admitting: Urology

## 2025-05-05 ENCOUNTER — Ambulatory Visit: Payer: Self-pay | Admitting: Internal Medicine

## 2025-10-07 ENCOUNTER — Ambulatory Visit: Payer: Self-pay
